# Patient Record
Sex: Male | Born: 1955
Health system: Southern US, Community
[De-identification: ages and names within clinical notes are randomized; demographics above are authoritative.]

## PROBLEM LIST (undated history)

## (undated) ENCOUNTER — Inpatient Hospital Stay: Admission: EM | Payer: Self-pay | Source: Home / Self Care

## (undated) DIAGNOSIS — M549 Dorsalgia, unspecified: Secondary | ICD-10-CM

## (undated) DIAGNOSIS — N529 Male erectile dysfunction, unspecified: Secondary | ICD-10-CM

## (undated) DIAGNOSIS — I2510 Atherosclerotic heart disease of native coronary artery without angina pectoris: Secondary | ICD-10-CM

## (undated) DIAGNOSIS — Z9289 Personal history of other medical treatment: Secondary | ICD-10-CM

## (undated) DIAGNOSIS — I10 Essential (primary) hypertension: Secondary | ICD-10-CM

## (undated) HISTORY — DX: Essential (primary) hypertension: I10

## (undated) HISTORY — PX: OTHER SURGICAL HISTORY: SHX169

---

## 2004-12-01 ENCOUNTER — Ambulatory Visit: Payer: Self-pay | Admitting: Family Medicine

## 2004-12-11 ENCOUNTER — Ambulatory Visit (HOSPITAL_COMMUNITY): Admission: RE | Admit: 2004-12-11 | Discharge: 2004-12-11 | Payer: Self-pay | Admitting: Family Medicine

## 2006-03-05 ENCOUNTER — Ambulatory Visit: Payer: Self-pay | Admitting: Family Medicine

## 2006-03-08 ENCOUNTER — Telehealth (INDEPENDENT_AMBULATORY_CARE_PROVIDER_SITE_OTHER): Payer: Self-pay | Admitting: Family Medicine

## 2006-03-15 ENCOUNTER — Encounter (INDEPENDENT_AMBULATORY_CARE_PROVIDER_SITE_OTHER): Payer: Self-pay | Admitting: Family Medicine

## 2006-06-04 ENCOUNTER — Ambulatory Visit: Payer: Self-pay | Admitting: Family Medicine

## 2006-10-05 ENCOUNTER — Encounter (INDEPENDENT_AMBULATORY_CARE_PROVIDER_SITE_OTHER): Payer: Self-pay | Admitting: Family Medicine

## 2006-10-08 LAB — CONVERTED CEMR LAB
ALT: 17 units/L (ref 0–53)
AST: 20 units/L (ref 0–37)
Albumin: 4.2 g/dL (ref 3.5–5.2)
Alkaline Phosphatase: 72 units/L (ref 39–117)
BUN: 27 mg/dL — ABNORMAL HIGH (ref 6–23)
Basophils Absolute: 0 10*3/uL (ref 0.0–0.1)
Basophils Relative: 0 % (ref 0–1)
CO2: 24 meq/L (ref 19–32)
Calcium: 9 mg/dL (ref 8.4–10.5)
Chloride: 106 meq/L (ref 96–112)
Cholesterol: 145 mg/dL (ref 0–200)
Creatinine, Ser: 1.04 mg/dL (ref 0.40–1.50)
Eosinophils Absolute: 0 10*3/uL (ref 0.0–0.7)
Eosinophils Relative: 1 % (ref 0–5)
Glucose, Bld: 91 mg/dL (ref 70–99)
HCT: 42 % (ref 39.0–52.0)
HDL: 55 mg/dL (ref >39)
Hemoglobin: 13.6 g/dL (ref 13.0–17.0)
LDL Cholesterol: 82 mg/dL (ref 0–99)
Lymphocytes Relative: 61 % — ABNORMAL HIGH (ref 12–46)
Lymphs Abs: 2.9 10*3/uL (ref 0.7–3.3)
MCHC: 32.4 g/dL (ref 30.0–36.0)
MCV: 96.1 fL (ref 78.0–100.0)
Monocytes Absolute: 0.4 10*3/uL (ref 0.2–0.7)
Monocytes Relative: 9 % (ref 3–11)
Neutro Abs: 1.3 10*3/uL — ABNORMAL LOW (ref 1.7–7.7)
Neutrophils Relative %: 28 % — ABNORMAL LOW (ref 43–77)
PSA: 0.37 ng/mL (ref 0.10–4.00)
Platelets: 272 10*3/uL (ref 150–400)
Potassium: 4 meq/L (ref 3.5–5.3)
RBC: 4.37 M/uL (ref 4.22–5.81)
RDW: 12.8 % (ref 11.5–14.0)
Sodium: 140 meq/L (ref 135–145)
TSH: 2.316 microintl units/mL (ref 0.350–5.50)
Total Bilirubin: 0.7 mg/dL (ref 0.3–1.2)
Total CHOL/HDL Ratio: 2.6
Total Protein: 7.7 g/dL (ref 6.0–8.3)
Triglycerides: 40 mg/dL (ref <150)
VLDL: 8 mg/dL (ref 0–40)
WBC: 4.7 10*3/uL (ref 4.0–10.5)

## 2007-01-13 ENCOUNTER — Encounter: Payer: Self-pay | Admitting: Family Medicine

## 2007-02-24 ENCOUNTER — Ambulatory Visit: Payer: Self-pay | Admitting: Family Medicine

## 2007-02-24 ENCOUNTER — Telehealth (INDEPENDENT_AMBULATORY_CARE_PROVIDER_SITE_OTHER): Payer: Self-pay | Admitting: *Deleted

## 2007-02-24 LAB — CONVERTED CEMR LAB
Cholesterol, target level: 200 mg/dL
HDL goal, serum: 40 mg/dL
LDL Goal: 160 mg/dL

## 2007-04-08 ENCOUNTER — Ambulatory Visit (HOSPITAL_COMMUNITY): Admission: RE | Admit: 2007-04-08 | Discharge: 2007-04-08 | Payer: Self-pay | Admitting: Gastroenterology

## 2007-04-08 ENCOUNTER — Ambulatory Visit: Payer: Self-pay | Admitting: Gastroenterology

## 2008-02-27 ENCOUNTER — Ambulatory Visit: Payer: Self-pay | Admitting: Family Medicine

## 2008-02-28 ENCOUNTER — Encounter (INDEPENDENT_AMBULATORY_CARE_PROVIDER_SITE_OTHER): Payer: Self-pay | Admitting: Family Medicine

## 2008-03-05 LAB — CONVERTED CEMR LAB
ALT: 26 units/L (ref 0–53)
AST: 22 units/L (ref 0–37)
Albumin: 4.3 g/dL (ref 3.5–5.2)
Alkaline Phosphatase: 73 units/L (ref 39–117)
BUN: 23 mg/dL (ref 6–23)
CO2: 26 meq/L (ref 19–32)
Calcium: 9.5 mg/dL (ref 8.4–10.5)
Chloride: 104 meq/L (ref 96–112)
Cholesterol: 164 mg/dL (ref 0–200)
Creatinine, Ser: 0.99 mg/dL (ref 0.40–1.50)
Glucose, Bld: 88 mg/dL (ref 70–99)
HDL: 55 mg/dL (ref >39)
LDL Cholesterol: 100 mg/dL — ABNORMAL HIGH (ref 0–99)
PSA: 0.43 ng/mL (ref 0.10–4.00)
Potassium: 4.1 meq/L (ref 3.5–5.3)
Sodium: 140 meq/L (ref 135–145)
TSH: 2.645 microintl units/mL (ref 0.350–4.50)
Total Bilirubin: 0.6 mg/dL (ref 0.3–1.2)
Total CHOL/HDL Ratio: 3
Total Protein: 8.2 g/dL (ref 6.0–8.3)
Triglycerides: 44 mg/dL (ref <150)
VLDL: 9 mg/dL (ref 0–40)

## 2008-09-11 ENCOUNTER — Ambulatory Visit: Payer: Self-pay | Admitting: Family Medicine

## 2008-09-11 DIAGNOSIS — R350 Frequency of micturition: Secondary | ICD-10-CM | POA: Insufficient documentation

## 2008-09-11 LAB — CONVERTED CEMR LAB
Bilirubin Urine: NEGATIVE
Blood Glucose, Fingerstick: 104
Glucose, Urine, Semiquant: NEGATIVE
Ketones, urine, test strip: NEGATIVE
Nitrite: NEGATIVE
Specific Gravity, Urine: 1.025
Urobilinogen, UA: 1
WBC Urine, dipstick: NEGATIVE
pH: 5.5

## 2009-02-21 ENCOUNTER — Ambulatory Visit (HOSPITAL_COMMUNITY): Payer: Self-pay | Admitting: Psychiatry

## 2009-03-06 ENCOUNTER — Ambulatory Visit (HOSPITAL_COMMUNITY): Payer: Self-pay | Admitting: Psychology

## 2009-03-21 ENCOUNTER — Ambulatory Visit (HOSPITAL_COMMUNITY): Payer: Self-pay | Admitting: Psychology

## 2009-03-25 ENCOUNTER — Ambulatory Visit: Payer: Self-pay | Admitting: Family Medicine

## 2009-03-25 LAB — CONVERTED CEMR LAB: OCCULT 1: NEGATIVE

## 2009-03-26 ENCOUNTER — Encounter: Payer: Self-pay | Admitting: Physician Assistant

## 2009-03-26 LAB — CONVERTED CEMR LAB
Chlamydia, Swab/Urine, PCR: NEGATIVE
GC Probe Amp, Urine: NEGATIVE

## 2009-04-01 ENCOUNTER — Encounter: Payer: Self-pay | Admitting: Physician Assistant

## 2009-04-02 LAB — CONVERTED CEMR LAB
ALT: 10 units/L (ref 0–53)
AST: 16 units/L (ref 0–37)
Albumin: 4.2 g/dL (ref 3.5–5.2)
Alkaline Phosphatase: 65 units/L (ref 39–117)
BUN: 14 mg/dL (ref 6–23)
CO2: 27 meq/L (ref 19–32)
Calcium: 9.2 mg/dL (ref 8.4–10.5)
Chloride: 100 meq/L (ref 96–112)
Cholesterol: 139 mg/dL (ref 0–200)
Creatinine, Ser: 0.93 mg/dL (ref 0.40–1.50)
Glucose, Bld: 92 mg/dL (ref 70–99)
HCT: 41.1 % (ref 39.0–52.0)
HDL: 50 mg/dL (ref >39)
Hemoglobin: 13.3 g/dL (ref 13.0–17.0)
Herpes Simplex Vrs I&II-IgM Ab (EIA): 0.21
LDL Cholesterol: 80 mg/dL (ref 0–99)
MCHC: 32.4 g/dL (ref 30.0–36.0)
MCV: 97.9 fL (ref 78.0–100.0)
PSA: 0.3 ng/mL (ref 0.10–4.00)
Platelets: 271 10*3/uL (ref 150–400)
Potassium: 3.9 meq/L (ref 3.5–5.3)
RBC: 4.2 M/uL — ABNORMAL LOW (ref 4.22–5.81)
RDW: 12.7 % (ref 11.5–15.5)
Sodium: 137 meq/L (ref 135–145)
TSH: 2.541 microintl units/mL (ref 0.350–4.500)
Total Bilirubin: 1.1 mg/dL (ref 0.3–1.2)
Total CHOL/HDL Ratio: 2.8
Total Protein: 8 g/dL (ref 6.0–8.3)
Triglycerides: 43 mg/dL (ref <150)
VLDL: 9 mg/dL (ref 0–40)
Vit D, 25-Hydroxy: 14 ng/mL — ABNORMAL LOW (ref 30–89)
WBC: 4.7 10*3/uL (ref 4.0–10.5)

## 2009-04-11 ENCOUNTER — Ambulatory Visit (HOSPITAL_COMMUNITY): Payer: Self-pay | Admitting: Psychology

## 2009-04-25 ENCOUNTER — Ambulatory Visit (HOSPITAL_COMMUNITY): Payer: Self-pay | Admitting: Psychology

## 2010-02-11 NOTE — Letter (Signed)
Summary: rpc chart  rpc chart   Imported By: Curtis Sites 10/24/2009 15:26:00  _____________________________________________________________________  External Attachment:    Type:   Image     Comment:   External Document

## 2010-02-11 NOTE — Assessment & Plan Note (Signed)
Summary: NEW PATIENT- room 1   Vital Signs:  Patient profile:   55 year old male Height:      69.5 inches Weight:      176.75 pounds BMI:     25.82 O2 Sat:      100 % on Room air Pulse rate:   90 / minute Resp:     16 per minute BP sitting:   100 / 60  (left arm)  Vitals Entered By: Adella Hare LPN (March 25, 2009 1:18 PM) CC: new patient Is Patient Diabetic? No Pain Assessment Patient in pain? no        Primary Provider:  Franchot Heidelberg, MD  CC:  new patient.  History of Present Illness: New pt here to establish care.  No current complaints.  He does want blood work done due to FH of diabetes.  His last physical was 1 yr ago.  Is going thru a divorce.  Is seeing a therapist to help deal with some anger that he is feeling towards his wife that has come about due to circumstances of the divorce.  He is not having any thoughts of harming himself or others.    Pt has lost some wt since his physical 1 yr ago.  He states that this is actually is avg wt & that he was heavier than usual last yr.  He is uncertain of his last tetnus vaccine.  There is no record of one in his previous records. His colonoscopy is UTD.  Current Medications (verified): 1)  Adult Aspirin Ec Low Strength 81 Mg  Tbec (Aspirin) .... One Daily  Allergies (verified): No Known Drug Allergies  Past History:  Past medical, surgical, family and social histories (including risk factors) reviewed, and no changes noted (except as noted below).  Past Medical History: Reviewed history from 02/27/2008 and no changes required. 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 Denies surgical history  Family History: Reviewed history from 02/27/2008 and no changes required. mother-deceased-DM - 70s/ Lung cancer due to  smoking. father-deceased-?ETOH abuse and tobacco abuse, may have had TB Brohers - 5 - unsure of ages Sisters - 60 - Unsure of ages - one with DM Kids - 6 kids - 4 boys and 2 girls - all healthy  Social History: Reviewed history from 02/24/2007 and no changes required. Married - seperated, divorcing Never Smoked Alcohol use-no Drug use-no Occupation: Copy at [employer]  Review of Systems General:  Denies chills and fever. Eyes:  Denies blurring and double vision. ENT:  Denies earache, nasal congestion, postnasal drainage, sinus pressure, and sore throat. CV:  Denies chest pain or discomfort, palpitations, and swelling of feet. Resp:  Denies cough and shortness of breath. GI:  Denies bloody stools, change in bowel habits, constipation, dark tarry stools, diarrhea, indigestion, nausea, and vomiting. GU:  Denies decreased libido, discharge, dysuria, erectile dysfunction, incontinence, nocturia, urinary frequency, and urinary hesitancy. MS:  Denies joint pain, low back pain, and mid back pain. Derm:  Denies lesion(s) and rash. Neuro:  Denies numbness and tingling. Psych:  Denies anxiety, depression, suicidal thoughts/plans, and thoughts of violence. Heme:  Denies enlarge lymph nodes and fevers.  Physical Exam  General:  Well-developed,well-nourished,in no acute distress; alert,appropriate and cooperative throughout examination Head:  Normocephalic and atraumatic without obvious abnormalities. No apparent alopecia or balding. Eyes:  No corneal or conjunctival inflammation noted. EOMI.  Perrla. Funduscopic exam benign, without hemorrhages, exudates or papilledema. Vision grossly normal. Ears:  External ear exam shows no significant lesions or deformities.  Otoscopic examination reveals clear canals, tympanic membranes are intact bilaterally without bulging, retraction, inflammation or discharge. Hearing is grossly normal bilaterally. Nose:  External nasal examination shows no deformity or  inflammation. Nasal mucosa are pink and moist without lesions or exudates. Mouth:  Oral mucosa and oropharynx without lesions or exudates.  Teeth in good repair. Neck:  No deformities, masses, or tenderness noted.no thyromegaly.   Lungs:  Normal respiratory effort, chest expands symmetrically. Lungs are clear to auscultation, no crackles or wheezes. Heart:  Normal rate and regular rhythm. S1 and S2 normal without gallop, murmur, click, rub or other extra sounds. Abdomen:  Bowel sounds positive,abdomen soft and non-tender without masses, organomegaly or hernias noted. Rectal:  No external abnormalities noted. Normal sphincter tone. No rectal masses or tenderness. Genitalia:  Testes bilaterally descended without nodularity, tenderness or masses. No scrotal masses or lesions. No penis lesions or urethral discharge. Prostate:  Prostate gland firm and smooth, no enlargement, nodularity, tenderness, mass, asymmetry or induration. Pulses:  R radial normal, R posterior tibial normal, L radial normal, and L posterior tibial normal.   Extremities:  No clubbing, cyanosis, edema, or deformity noted with normal full range of motion of all joints.   Neurologic:  alert & oriented X3, cranial nerves II-XII intact, strength normal in all extremities, sensation intact to light touch, gait normal, and DTRs symmetrical and normal.   Skin:  Intact without suspicious lesions or rashes Cervical Nodes:  No lymphadenopathy noted Psych:  Cognition and judgment appear intact. Alert and cooperative with normal attention span and concentration. No apparent delusions, illusions, hallucinations   Impression & Recommendations:  Problem # 1:  Preventive Health Care (ICD-V70.0) Labs ordered, including std screening per pt request. Encouraged condom use now that he is single again. Discussed & encouraged routine dental & eye exams. Tdap given today.  Complete Medication List: 1)  Adult Aspirin Ec Low Strength 81 Mg Tbec  (Aspirin) .... One daily  Other Orders: T-Comprehensive Metabolic Panel [id number]) T-Lipid Profile [id number]) T-CBC No Diff ([id number]) T-TSH [id number]) T-PSA [id number]) T-Vitamin D (25-Hydroxy) [id number]) T-HIV-1 (Screen) [phone]) T-RPR (Syphilis) [id number]) T-Herpes I and II, IgM, Reflex [phone]) Hepatitis C Antibody ([id number]) T-Chlamydia & GC Probe, Urine ([id number]) Tdap => 16yrs IM (10932) T-Hemoccult Card-Single (35573) Admin 1st Vaccine (22025) Admin 1st Vaccine [facility]) [phone])  Patient Instructions: 1)  Your next physical is due in 1 year.  I will see you sooner as needed. 2)  Please schedule an eye exam & continue routine dental care. 3)  I have ordered blood work for you.  This needs to be done fasting.   Tetanus/Td Vaccine    Vaccine Type: Tdap    Site: left deltoid    Mfr: Sanofi Pasteur    Dose: 0.5 ml    Route: IM    Given by: Adella Hare LPN    Exp. Date: 04/04/2011    Lot #: [id number]    VIS given: 11/30/06 version given March 25, 2009.     Laboratory Results    Stool - Occult Blood Hemmoccult #1: negative Date: 03/25/2009    Prevention & Chronic Care Immunizations   Influenza vaccine: Hx of  (02/27/2008)   Influenza vaccine due: 02/2009    Tetanus booster: 03/25/2009: Tdap   Tetanus booster due: 03/26/2019    Pneumococcal vaccine: Not documented   Pneumococcal vaccine deferral: Not indicated  (  03/25/2009)   Pneumococcal vaccine due: 02/16/2022  Colorectal Screening   Hemoccult: Not documented   Hemoccult due: 03/26/2010    Colonoscopy: normal  (04/08/2007)   Colonoscopy due: 04/2017  Other Screening   PSA: 0.43  (02/28/2008)   PSA ordered.   Smoking status: never  (03/05/2006)  Lipids   Total Cholesterol: 164  (02/28/2008)   LDL: 100  (02/28/2008)   LDL Direct: Not documented   HDL: 55  (02/28/2008)   Triglycerides: 44  (02/28/2008)   Nursing Instructions: Give tetanus booster  today

## 2010-02-11 NOTE — Letter (Signed)
Summary: Letter  Letter   Imported By: Lind Guest 04/01/2009 15:32:00  _____________________________________________________________________  External Attachment:    Type:   Image     Comment:   External Document

## 2010-05-27 NOTE — Op Note (Signed)
NAME:  Eugene [name], Eugene [name]               ACCOUNT NO.:  [id number]   MEDICAL RECORD NO.:  [id number]          PATIENT TYPE:  AMB   LOCATION:  DAY                           FACILITY:  [facility]   PHYSICIAN:  Kassie Mends, M.D.      DATE OF BIRTH:  09-07-1955   DATE OF PROCEDURE:  04/08/2007  DATE OF DISCHARGE:                               OPERATIVE REPORT   REFERRING PHYSICIAN:  Franchot Heidelberg, M.D.   PROCEDURE:  Colonoscopy.   INDICATION FOR EXAM:  Eugene [name] is a 55 year old male who presents for  average-risk colon cancer screening.   FINDINGS:  Small internal hemorrhoids.  Otherwise normal colon without  evidence of polyps, mass, inflammatory changes, diverticula or AVMs.   RECOMMENDATIONS:  1. High-fiber diet.  He is given a handout on high-fiber diet and      hemorrhoids.  2. Screening colonoscopy in 10 years with a clear liquid diet.  The      patient is instructed not to consume solid food prior to his      colonoscopy.   MEDICATIONS:  1. Demerol 50 mg IV.  2. Versed 4 mg IV.   PROCEDURE TECHNIQUE:  Physical exam was performed.  Informed consent was  obtained from the patient explaining the benefits, risks and  alternatives to the procedure.  The patient was connected to the monitor  and placed in the left lateral position.  Continuous oxygen was provided  by nasal cannula and IV medicine administered through an indwelling  cannula.  After administration of sedation and a rectal exam, the  patient's rectum was intubated and the scope was advanced under direct  visualization to the cecum.  The patient had some particulate matter and  retained liquid stool in the colon, which was able to be aspirated.  The  presence of solid or digested particulate food caused a film to form on  the lens.  The scope was withdrawn at 40-50 cm and then reinserted after  the lens was cleaned.  The scope was removed slowly by carefully  examining the color, texture, anatomy and integrity of the  mucosa on the  way out.  The patient was recovered in endoscopy and discharged home in  satisfactory condition.      Kassie Mends, M.D.  Electronically Signed     SM/MEDQ  D:  04/08/2007  T:  04/09/2007  Job:  [id number]   cc:   Franchot Heidelberg, M.D.

## 2010-09-09 ENCOUNTER — Ambulatory Visit (INDEPENDENT_AMBULATORY_CARE_PROVIDER_SITE_OTHER): Payer: Commercial Managed Care - PPO | Admitting: Family Medicine

## 2010-09-09 ENCOUNTER — Encounter: Payer: Self-pay | Admitting: Family Medicine

## 2010-09-09 VITALS — BP 102/70 | HR 61 | Resp 16 | Ht 69.75 in | Wt 173.4 lb

## 2010-09-09 DIAGNOSIS — Z0000 Encounter for general adult medical examination without abnormal findings: Secondary | ICD-10-CM

## 2010-09-09 DIAGNOSIS — N529 Male erectile dysfunction, unspecified: Secondary | ICD-10-CM

## 2010-09-09 NOTE — Patient Instructions (Signed)
Return for a physical in 1 year unless you have any acute concerns Call back if you would like the lab done.  Keep up the good work!

## 2010-09-10 ENCOUNTER — Encounter: Payer: Self-pay | Admitting: Family Medicine

## 2010-09-10 NOTE — Progress Notes (Signed)
  Subjective:    Patient ID: Eugene [name], male    DOB: 1955/09/04, 55 y.o.   MRN: [id number]  HPI Pt here for annual exam.  His only concern is loosing his erection early, he thinks it is secondary to stress, he is in a monogamous relationship, occ premature ejaculation  He is a [employer] employee and had is routine health maintenance lab in Oct 2011- labs will be scanned into system No chronic medical conditions Takes MVI and ASA most days a week He does not exercise No tobacco  Colonoscopy done PSA 0.4 at screening  He is overdue for dental exam and eye exam  Review of Systems GEN- denies fatigue, fever, weight loss,weakness, recent illness HEENT- denies eye drainage, change in vision, nasal discharge, CVS- denies chest pain, palpitations RESP- denies SOB, cough, wheeze ABD- denies N/V, change in stools, abd pain GU- denies dysuria, hematuria, dribbling, incontinence, denies penile discharge MSK- denies joint pain, muscle aches, injury Neuro- denies headache, dizziness, syncope, seizure activity      Objective:   Physical Exam   GEN- NAD, alert and oriented x3 HEENT- PERRL, EOMI, non injected sclera, pink conjunctiva, MMM, oropharynx clear, difficult fundoscopic exam secondary to small pupils Neck- Supple, no thryomegaly CVS- RRR, no murmur RESP-CTAB Abd- NABS, sot, NT, ND, no appreciable hernias, no masses EXT- No edema Pulses- Radial, DP- 2+        Assessment & Plan:   CPE- Normal physical exam, reviewed labs, FLP, A1C, PSA all normal. Will scan into chart  Immunizations UTD, pt will have flu shot at employee health so that it will be on his record  Erectile dysfunction- I discussed, he is a healthy male, without any disorders that can lead to neuropathy, his PSA is normal, would check testosterone and pt could have  A trial of Viagra or Cialis. He declined any work-up or meds at this time. He will let me know if he changes his mind.

## 2011-01-20 ENCOUNTER — Ambulatory Visit (INDEPENDENT_AMBULATORY_CARE_PROVIDER_SITE_OTHER): Payer: Commercial Managed Care - PPO | Admitting: Family Medicine

## 2011-01-20 ENCOUNTER — Encounter: Payer: Self-pay | Admitting: Family Medicine

## 2011-01-20 VITALS — BP 104/78 | HR 58 | Resp 16 | Ht 69.5 in | Wt 175.1 lb

## 2011-01-20 DIAGNOSIS — N529 Male erectile dysfunction, unspecified: Secondary | ICD-10-CM

## 2011-01-20 MED ORDER — TADALAFIL 10 MG PO TABS: 10.0000 mg | ORAL_TABLET | ORAL | Status: DC | PRN

## 2011-01-20 NOTE — Progress Notes (Signed)
  Subjective:    Patient ID: Eugene [name], male    DOB: Apr 08, 1955, 56 y.o.   MRN: [id number]  HPI  Patient has history of erectile dysfunction. Her previous visit he had defer testosterone testing and use of medication. Today he is here with his fiance he would like to have his levels checked. He does state that he occasionally has pain in the testicular region into his rectum but this occurs very sporadically. He denies any penile discharge, difficulty with urination, blood in urine. He is unable to maintain his erection has decreased libido and premature ejaculation. He denies any fatigue. He does admit to some stress. He had his PSA checked our last visit which was normal. His other labs were also normal.   Review of Systems - per above     Objective:   Physical Exam GEN-NAD, pleasant       Assessment & Plan:

## 2011-01-20 NOTE — Assessment & Plan Note (Signed)
Patient is fairly healthy middle-aged male. I will refer him to urology for workup of his erectile dysfunction, I think he was somewhat comfortable with a male physician discussing this. Testosterone levels will be drawn today, patient given a prescription for 30 day trial of Cialis.

## 2011-01-20 NOTE — Patient Instructions (Signed)
You will be referred to a urologist  We will call with lab results Try the Cialis- take 1 hour before intercourse

## 2011-01-21 LAB — TESTOSTERONE, FREE, TOTAL, SHBG
Sex Hormone Binding: 34 nmol/L (ref 13–71)
Testosterone, Free: 78.4 pg/mL (ref 47.0–244.0)
Testosterone-% Free: 2 % (ref 1.6–2.9)
Testosterone: 393.48 ng/dL (ref 250–890)

## 2011-03-13 ENCOUNTER — Ambulatory Visit (INDEPENDENT_AMBULATORY_CARE_PROVIDER_SITE_OTHER): Payer: Commercial Managed Care - PPO | Admitting: Urology

## 2011-03-13 DIAGNOSIS — N529 Male erectile dysfunction, unspecified: Secondary | ICD-10-CM

## 2011-04-20 ENCOUNTER — Telehealth: Payer: Self-pay | Admitting: Family Medicine

## 2011-04-20 NOTE — Telephone Encounter (Signed)
No answering machine I need another copy of his labs from [facility], so that I can scan again and send a copy to Dr. Annabell Howells ( Urology)

## 2011-06-03 NOTE — Telephone Encounter (Signed)
Attempted to call pt and number is no longer working. Will send pt a letter asking him to contact office.

## 2011-09-09 ENCOUNTER — Encounter: Payer: Commercial Managed Care - PPO | Admitting: Family Medicine

## 2011-10-05 ENCOUNTER — Ambulatory Visit (INDEPENDENT_AMBULATORY_CARE_PROVIDER_SITE_OTHER): Payer: Commercial Managed Care - PPO | Admitting: Family Medicine

## 2011-10-05 ENCOUNTER — Encounter: Payer: Self-pay | Admitting: Family Medicine

## 2011-10-05 VITALS — BP 130/68 | HR 74 | Resp 18 | Ht 69.5 in | Wt 180.0 lb

## 2011-10-05 DIAGNOSIS — Z1321 Encounter for screening for nutritional disorder: Secondary | ICD-10-CM

## 2011-10-05 DIAGNOSIS — Z130 Encounter for screening for diseases of the blood and blood-forming organs and certain disorders involving the immune mechanism: Secondary | ICD-10-CM

## 2011-10-05 DIAGNOSIS — Z13220 Encounter for screening for lipoid disorders: Secondary | ICD-10-CM

## 2011-10-05 DIAGNOSIS — Z0000 Encounter for general adult medical examination without abnormal findings: Secondary | ICD-10-CM

## 2011-10-05 DIAGNOSIS — N529 Male erectile dysfunction, unspecified: Secondary | ICD-10-CM

## 2011-10-05 LAB — CBC
HCT: 41.8 % (ref 39.0–52.0)
MCH: 32 pg (ref 26.0–34.0)
MCHC: 34 g/dL (ref 30.0–36.0)
MCV: 94.1 fL (ref 78.0–100.0)
Platelets: 299 10*3/uL (ref 150–400)
RBC: 4.44 MIL/uL (ref 4.22–5.81)
RDW: 12.5 % (ref 11.5–15.5)
WBC: 4.7 10*3/uL (ref 4.0–10.5)

## 2011-10-05 LAB — LIPID PANEL
Cholesterol: 168 mg/dL (ref 0–200)
LDL Cholesterol: 108 mg/dL — ABNORMAL HIGH (ref 0–99)
Total CHOL/HDL Ratio: 3.3 Ratio
Triglycerides: 44 mg/dL (ref <150)
VLDL: 9 mg/dL (ref 0–40)

## 2011-10-05 LAB — COMPREHENSIVE METABOLIC PANEL
ALT: 12 U/L (ref 0–53)
AST: 16 U/L (ref 0–37)
Albumin: 4.4 g/dL (ref 3.5–5.2)
Alkaline Phosphatase: 64 U/L (ref 39–117)
BUN: 21 mg/dL (ref 6–23)
CO2: 29 mEq/L (ref 19–32)
Calcium: 9.6 mg/dL (ref 8.4–10.5)
Chloride: 104 mEq/L (ref 96–112)
Creat: 0.95 mg/dL (ref 0.50–1.35)
Potassium: 4 mEq/L (ref 3.5–5.3)
Sodium: 140 mEq/L (ref 135–145)
Total Bilirubin: 0.9 mg/dL (ref 0.3–1.2)
Total Protein: 8.2 g/dL (ref 6.0–8.3)

## 2011-10-05 NOTE — Patient Instructions (Signed)
Continue exercising Get the flu shot at work  Get the labs done today we will call with results Eye appt needed Dental visit needed F/U 1 year

## 2011-10-05 NOTE — Assessment & Plan Note (Addendum)
Unchanged, Stress playing a major factor, normal Testosterone levels, PDE offered again pt declined, reviewed urology note, otherwise normal  He is to work on stress and his relationship, continue exercise I will f/u with cards on whether or not routine stress testing is done   Discussed with cardiology, stress test not needed

## 2011-10-05 NOTE — Progress Notes (Signed)
  Subjective:    Patient ID: Eugene [name], male    DOB: 1955-02-09, 56 y.o.   MRN: [id number]  HPI Patient here for complete physical exam. He continues to have difficulty refractive dysfunction however does not want medications. He was seen by urology a couple months ago with otherwise normal examination. There was some concern that he had ED early and this can be associated with early coronary artery disease. He has no family history of coronary artery disease. He has been stressed a lot because of his separation now 4 years with his wife, not officially divorced also having difficulty with his girlfriend and trust is an issue. He does not feel depressed   Review of Systems  GEN- denies fatigue, fever, weight loss,weakness, recent illness HEENT- denies eye drainage, change in vision, nasal discharge, CVS- denies chest pain, palpitations RESP- denies SOB, cough, wheeze ABD- denies N/V, change in stools, abd pain GU- denies dysuria, hematuria, dribbling, incontinence MSK- denies joint pain, muscle aches, injury Neuro- denies headache, dizziness, syncope, seizure activity      Objective:   Physical Exam GEN- NAD, alert and oriented x3 HEENT- PERRL, EOMI, non injected sclera, pink conjunctiva, MMM, oropharynx clear Neck- Supple, CVS- RRR, no murmur RESP-CTAB ABD-NABS,soft,NT,ND EXT- No edema Pulses- Radial, DP- 2+ Psych-normal affect and Mood       Assessment & Plan:    CPE- cancer prevention UTD, flu shot to be done at work   Fasting labs to be done today  Immunizations otherwise UTD  Needs Eye visit/dental visit   Seen by urology

## 2011-10-10 LAB — VITAMIN D 1,25 DIHYDROXY
Vitamin D 1, 25 (OH)2 Total: 57 pg/mL (ref 18–72)
Vitamin D2 1, 25 (OH)2: <8 pg/mL
Vitamin D3 1, 25 (OH)2: 57 pg/mL

## 2011-11-11 ENCOUNTER — Telehealth: Payer: Self-pay | Admitting: Family Medicine

## 2011-11-11 NOTE — Telephone Encounter (Signed)
Called patient back to find out what he needed and he said to just have Dr call him back when you got a chance. Wouldn't go into detail

## 2011-11-12 MED ORDER — TADALAFIL 5 MG PO TABS: 5.0000 mg | ORAL_TABLET | Freq: Every day | ORAL | Status: DC | PRN

## 2011-11-12 NOTE — Telephone Encounter (Signed)
Spoke with pt, would like to try Cialis, will give him 30 day sample  Instructions 2 tablets 1 hour before intercourse

## 2012-02-19 ENCOUNTER — Encounter: Payer: Self-pay | Admitting: Family Medicine

## 2012-02-19 ENCOUNTER — Ambulatory Visit (INDEPENDENT_AMBULATORY_CARE_PROVIDER_SITE_OTHER): Payer: Commercial Managed Care - PPO | Admitting: Family Medicine

## 2012-02-19 VITALS — BP 120/80 | HR 65 | Resp 16 | Ht 69.5 in | Wt 186.0 lb

## 2012-02-19 DIAGNOSIS — N529 Male erectile dysfunction, unspecified: Secondary | ICD-10-CM
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
## 2012-02-19 NOTE — Patient Instructions (Signed)
Try the 20mg  , call if this does not help and we will try Viagra Physical in Sept 2014

## 2012-02-19 NOTE — Assessment & Plan Note (Signed)
Believe his erectile dysfunction centers around the stress and strain he has in his relationship. He has been evaluated by urology already. We will increase his Cialis 20 mg at this does not work we will try him on Viagra 5200 mg prior to sexual activity. He's also going to start exercising again

## 2012-05-30 ENCOUNTER — Telehealth: Payer: Self-pay | Admitting: Family Medicine

## 2012-05-30 NOTE — Telephone Encounter (Signed)
Patient would not specify but I think he is referring to needing a refill on Cialis.  Is this ok?

## 2012-05-30 NOTE — Telephone Encounter (Signed)
He was suppose to try cialsis 20mg  if this didn't help, i was going to change him to viagra 100mg  He needs to tell us which one he wants

## 2012-05-31 MED ORDER — SILDENAFIL CITRATE 100 MG PO TABS: 100.0000 mg | ORAL_TABLET | ORAL | Status: DC | PRN

## 2012-05-31 NOTE — Telephone Encounter (Signed)
Patient states that he would like Viagra.

## 2012-06-02 ENCOUNTER — Other Ambulatory Visit: Payer: Self-pay

## 2012-06-02 ENCOUNTER — Telehealth: Payer: Self-pay | Admitting: Family Medicine

## 2012-06-02 MED ORDER — SILDENAFIL CITRATE 100 MG PO TABS: 100.0000 mg | ORAL_TABLET | ORAL | Status: DC | PRN

## 2012-06-02 NOTE — Telephone Encounter (Signed)
Spoke with pt, he was concerned the dose was 100 mg and his Cialis was 20 mg. Explained to him that these are 2 different types of medications and come in 2 different doses. If needed one of these help I recommended that he go back to urology may be penile injections or penile pump or some other new medication may help him better he voiced understanding.

## 2013-08-24 ENCOUNTER — Encounter: Payer: Self-pay | Admitting: Family Medicine

## 2013-08-24 ENCOUNTER — Ambulatory Visit (INDEPENDENT_AMBULATORY_CARE_PROVIDER_SITE_OTHER): Payer: 59 | Admitting: Family Medicine

## 2013-08-24 VITALS — BP 136/80 | HR 64 | Temp 98.3°F | Resp 12 | Ht 69.0 in | Wt 183.0 lb

## 2013-08-24 DIAGNOSIS — N529 Male erectile dysfunction, unspecified: Secondary | ICD-10-CM

## 2013-08-24 DIAGNOSIS — Z125 Encounter for screening for malignant neoplasm of prostate: Secondary | ICD-10-CM

## 2013-08-24 DIAGNOSIS — Z0000 Encounter for general adult medical examination without abnormal findings: Secondary | ICD-10-CM

## 2013-08-24 DIAGNOSIS — K6289 Other specified diseases of anus and rectum: Secondary | ICD-10-CM

## 2013-08-24 LAB — URINALYSIS, ROUTINE W REFLEX MICROSCOPIC
GLUCOSE, UA: NEGATIVE mg/dL
Ketones, ur: NEGATIVE mg/dL
LEUKOCYTES UA: NEGATIVE
Nitrite: NEGATIVE
Protein, ur: 30 mg/dL — AB
Specific Gravity, Urine: >1.03 — ABNORMAL HIGH (ref 1.005–1.030)
Urobilinogen, UA: 1 mg/dL (ref 0.0–1.0)
pH: 5.5 (ref 5.0–8.0)

## 2013-08-24 LAB — LIPID PANEL
CHOL/HDL RATIO: 3 ratio
Cholesterol: 151 mg/dL (ref 0–200)
HDL: 50 mg/dL (ref >39)
LDL Cholesterol: 93 mg/dL (ref 0–99)
Triglycerides: 40 mg/dL (ref <150)
VLDL: 8 mg/dL (ref 0–40)

## 2013-08-24 LAB — CBC WITH DIFFERENTIAL/PLATELET
Basophils Absolute: 0 10*3/uL (ref 0.0–0.1)
Basophils Relative: 0 % (ref 0–1)
EOS PCT: 1 % (ref 0–5)
Eosinophils Absolute: 0.1 10*3/uL (ref 0.0–0.7)
HCT: 40.4 % (ref 39.0–52.0)
HEMOGLOBIN: 13.5 g/dL (ref 13.0–17.0)
LYMPHS ABS: 2.9 10*3/uL (ref 0.7–4.0)
Lymphocytes Relative: 53 % — ABNORMAL HIGH (ref 12–46)
MCH: 31.8 pg (ref 26.0–34.0)
MCHC: 33.4 g/dL (ref 30.0–36.0)
MCV: 95.1 fL (ref 78.0–100.0)
Monocytes Absolute: 0.6 10*3/uL (ref 0.1–1.0)
Monocytes Relative: 11 % (ref 3–12)
Neutro Abs: 1.9 10*3/uL (ref 1.7–7.7)
Neutrophils Relative %: 35 % — ABNORMAL LOW (ref 43–77)
Platelets: 286 10*3/uL (ref 150–400)
RBC: 4.25 MIL/uL (ref 4.22–5.81)
RDW: 13.4 % (ref 11.5–15.5)
WBC: 5.4 10*3/uL (ref 4.0–10.5)

## 2013-08-24 LAB — COMPREHENSIVE METABOLIC PANEL
ALT: 18 U/L (ref 0–53)
AST: 26 U/L (ref 0–37)
Albumin: 4.4 g/dL (ref 3.5–5.2)
Alkaline Phosphatase: 68 U/L (ref 39–117)
BUN: 22 mg/dL (ref 6–23)
CO2: 28 mEq/L (ref 19–32)
Calcium: 9.7 mg/dL (ref 8.4–10.5)
Chloride: 106 mEq/L (ref 96–112)
Creat: 1.09 mg/dL (ref 0.50–1.35)
Glucose, Bld: 99 mg/dL (ref 70–99)
Potassium: 4 mEq/L (ref 3.5–5.3)
Sodium: 141 mEq/L (ref 135–145)
Total Bilirubin: 0.9 mg/dL (ref 0.2–1.2)
Total Protein: 8.9 g/dL — ABNORMAL HIGH (ref 6.0–8.3)

## 2013-08-24 LAB — URINALYSIS, MICROSCOPIC ONLY: Crystals: NONE SEEN

## 2013-08-24 MED ORDER — CIPROFLOXACIN HCL 500 MG PO TABS: 500.0000 mg | ORAL_TABLET | Freq: Two times a day (BID) | ORAL | Status: DC

## 2013-08-24 MED ORDER — SILDENAFIL CITRATE 100 MG PO TABS: 100.0000 mg | ORAL_TABLET | ORAL | Status: DC | PRN

## 2013-08-24 NOTE — Patient Instructions (Signed)
I recommend eye visit once a year I recommend dental visit every 6 months Goal is to  Exercise 30 minutes 5 days a week We will send a letter with lab results  Take antibiotics as prescribed

## 2013-08-24 NOTE — Progress Notes (Signed)
Patient ID: Eugene [name], male   DOB: [birth year], 58 y.o.   MRN: [id number]   Subjective:    Patient ID: Eugene [name], male    DOB: [birth year], 58 y.o.   MRN: [id number]  Patient presents for CPE  Pt here for CPE, complains of rectal pain that shoots to groin on and off past few months mostly occurs with ejaculation, no scrotal swelling, has history of ED, denies BPH symptoms. No change in BM, no blood in stool, no hematuria, no dysuria Immunizations UTD Due for fasting labs Colonoscopy UTD   Review Of Systems:  GEN- denies fatigue, fever, weight loss,weakness, recent illness HEENT- denies eye drainage, change in vision, nasal discharge, CVS- denies chest pain, palpitations RESP- denies SOB, cough, wheeze ABD- denies N/V, change in stools, abd pain GU- denies dysuria, hematuria, dribbling, incontinence MSK- denies joint pain, muscle aches, injury Neuro- denies headache, dizziness, syncope, seizure activity       Objective:    BP 136/80  Pulse 64  Temp(Src) 98.3 F (36.8 C) (Oral)  Resp 12  Ht 5\' 9"  (1.753 m)  Wt 183 lb (83.008 kg)  BMI 27.01 kg/m2 GEN- NAD, alert and oriented x3 HEENT- PERRL, EOMI, non injected sclera, pink conjunctiva, MMM, oropharynx clear Neck- Supple, no thyromegaly CVS- RRR, no murmur RESP-CTAB ABD-NABS,soft,NT,ND GU- rectum- Normal external appearance, no gross blood, prostate slightly enlarged, no nodules, no bogginess, no scrotal swelling  EXT- No edema Pulses- Radial, DP- 2+        Assessment & Plan:      Problem List Items Addressed This Visit   Routine general medical examination at a health care facility - Primary CPE done, fasting labs, PSA screening discussed risk/benefits   Relevant Orders       CBC with Differential (Completed)      Comprehensive metabolic panel (Completed)      Lipid panel (Completed)   Prostate cancer screening   Relevant Orders      PSA (Completed)   ED (erectile dysfunction)    Other Visit Diagnoses    Rectal pain        UA and culture done, possible prostatitis, will treat with cipro and see how he does, may need urology, no change in bowel movements, no blood in stool,     Relevant Orders       Urinalysis, Routine w reflex microscopic (Completed)       Urine culture       Note: This dictation was prepared with Dragon dictation along with smaller phrase technology. Any transcriptional errors that result from this process are unintentional.

## 2013-08-25 LAB — PSA: PSA: 0.32 ng/mL (ref <=4.00)

## 2013-08-25 LAB — URINE CULTURE
COLONY COUNT: NO GROWTH
Organism ID, Bacteria: NO GROWTH

## 2013-08-28 ENCOUNTER — Encounter: Payer: Self-pay | Admitting: *Deleted

## 2014-09-05 ENCOUNTER — Other Ambulatory Visit: Payer: Self-pay | Admitting: Family Medicine

## 2014-09-05 ENCOUNTER — Other Ambulatory Visit: Payer: 59

## 2014-09-05 DIAGNOSIS — Z0000 Encounter for general adult medical examination without abnormal findings: Secondary | ICD-10-CM

## 2014-09-05 LAB — COMPLETE METABOLIC PANEL WITH GFR
ALBUMIN: 3.8 g/dL (ref 3.6–5.1)
ALT: 16 U/L (ref 9–46)
AST: 20 U/L (ref 10–35)
Alkaline Phosphatase: 76 U/L (ref 40–115)
BUN: 19 mg/dL (ref 7–25)
CO2: 27 mmol/L (ref 20–31)
CREATININE: 0.87 mg/dL (ref 0.70–1.33)
Calcium: 8.7 mg/dL (ref 8.6–10.3)
Chloride: 102 mmol/L (ref 98–110)
GFR, Est African American: >89 mL/min (ref >=60)
GFR, Est Non African American: >89 mL/min (ref >=60)
Glucose, Bld: 90 mg/dL (ref 70–99)
Potassium: 4 mmol/L (ref 3.5–5.3)
Sodium: 137 mmol/L (ref 135–146)
Total Bilirubin: 0.6 mg/dL (ref 0.2–1.2)
Total Protein: 7.9 g/dL (ref 6.1–8.1)

## 2014-09-05 LAB — CBC WITH DIFFERENTIAL/PLATELET
BASOS ABS: 0 10*3/uL (ref 0.0–0.1)
Basophils Relative: 0 % (ref 0–1)
Eosinophils Absolute: 0.1 10*3/uL (ref 0.0–0.7)
Eosinophils Relative: 1 % (ref 0–5)
HEMATOCRIT: 39.4 % (ref 39.0–52.0)
Hemoglobin: 12.8 g/dL — ABNORMAL LOW (ref 13.0–17.0)
Lymphocytes Relative: 61 % — ABNORMAL HIGH (ref 12–46)
Lymphs Abs: 4 10*3/uL (ref 0.7–4.0)
MCH: 31.6 pg (ref 26.0–34.0)
MCHC: 32.5 g/dL (ref 30.0–36.0)
MCV: 97.3 fL (ref 78.0–100.0)
MPV: 9.5 fL (ref 8.6–12.4)
Monocytes Absolute: 0.7 10*3/uL (ref 0.1–1.0)
Monocytes Relative: 10 % (ref 3–12)
Neutro Abs: 1.8 10*3/uL (ref 1.7–7.7)
Neutrophils Relative %: 28 % — ABNORMAL LOW (ref 43–77)
Platelets: 253 10*3/uL (ref 150–400)
RBC: 4.05 MIL/uL — ABNORMAL LOW (ref 4.22–5.81)
RDW: 13.4 % (ref 11.5–15.5)
WBC: 6.6 10*3/uL (ref 4.0–10.5)

## 2014-09-05 LAB — LIPID PANEL
Cholesterol: 149 mg/dL (ref 125–200)
HDL: 42 mg/dL (ref >=40)
LDL Cholesterol: 93 mg/dL (ref <130)
Total CHOL/HDL Ratio: 3.5 Ratio (ref <=5.0)
Triglycerides: 70 mg/dL (ref <150)
VLDL: 14 mg/dL (ref <30)

## 2014-09-05 LAB — TSH: TSH: 5.249 u[IU]/mL — ABNORMAL HIGH (ref 0.350–4.500)

## 2014-09-06 LAB — PSA: PSA: 0.33 ng/mL (ref <=4.00)

## 2014-09-07 LAB — T3, FREE: T3 FREE: 3.7 pg/mL (ref 2.3–4.2)

## 2014-09-07 LAB — T4, FREE: Free T4: 0.77 ng/dL — ABNORMAL LOW (ref 0.80–1.80)

## 2014-09-14 ENCOUNTER — Ambulatory Visit (INDEPENDENT_AMBULATORY_CARE_PROVIDER_SITE_OTHER): Payer: 59 | Admitting: Family Medicine

## 2014-09-14 ENCOUNTER — Encounter: Payer: Self-pay | Admitting: Family Medicine

## 2014-09-14 VITALS — BP 132/86 | HR 56 | Temp 98.1°F | Resp 18 | Ht 68.25 in | Wt 201.0 lb

## 2014-09-14 DIAGNOSIS — Z0000 Encounter for general adult medical examination without abnormal findings: Secondary | ICD-10-CM | POA: Diagnosis not present

## 2014-09-14 DIAGNOSIS — E669 Obesity, unspecified: Secondary | ICD-10-CM | POA: Diagnosis not present

## 2014-09-14 DIAGNOSIS — R7989 Other specified abnormal findings of blood chemistry: Secondary | ICD-10-CM

## 2014-09-14 NOTE — Patient Instructions (Signed)
I recommend eye visit once a year I recommend dental visit every 6 months Goal is to  Exercise 30 minutes 5 days a week Get the thyroid lab done in 6 weeks (Mid Oct) F/U 1 year for physical

## 2014-09-14 NOTE — Assessment & Plan Note (Signed)
CPE done, immunizations, UTD, flu shot to be given at work Colonoscopy UTD Offered STD check as now split from girlfriend he declined

## 2014-09-14 NOTE — Progress Notes (Signed)
Patient ID: Eugene [name], male   DOB: 1955-06-20, 59 y.o.   MRN: [id number]   Subjective:    Patient ID: Eugene [name], male    DOB: [birth year], 59 y.o.   MRN: [id number]  Patient presents for Annual Exam  patient here for annual physical exam. He has no particular concerns today. He does tell me he is now separated from his girlfriend there were too many trust issues he feels better about this. He is not stressed out like he was previously. He is still working full time and he has his sons who has been helping out. He has actually gained about 20 pounds he states that his son moved in with him when he was separated from his girlfriend and they were eating a lot of fast food as well as a lot of bread and biscuits and he did not realize that he was not eating as well as previous. He also stopped exercising and he had stopped his multivitamins.  His fasting labs were reviewed his cholesterol panel PSA metabolic panel were all normal his thyroid was slightly abnormal his TSH was elevated at 5.2 in T4 was a little low at 0.77 both near borderline normal. T3 was normal. He denies any cold intolerance or change in his skin besides the weight gain no chest pain shortness of breath headaches or other thyroid symptoms    Review Of Systems:  GEN- denies fatigue, fever, weight loss,weakness, recent illness HEENT- denies eye drainage, change in vision, nasal discharge, CVS- denies chest pain, palpitations RESP- denies SOB, cough, wheeze ABD- denies N/V, change in stools, abd pain GU- denies dysuria, hematuria, dribbling, incontinence MSK- denies joint pain, muscle aches, injury Neuro- denies headache, dizziness, syncope, seizure activity       Objective:    BP 132/86 mmHg  Pulse 56  Temp(Src) 98.1 F (36.7 C) (Oral)  Resp 18  Ht 5' 8.25" (1.734 m)  Wt 201 lb (91.173 kg)  BMI 30.32 kg/m2 GEN- NAD, alert and oriented x3 HEENT- PERRL, EOMI, non injected sclera, pink conjunctiva, MMM, oropharynx  clear Neck- Supple, no thyromegaly, no nodules felt  CVS- RRR, no murmur RESP-CTAB ABD-NABS,soft,NT,ND GU- Rectum normal tone, prostate smooth, no nodules, mild enlargement, FOBT Negative  Psych- normal affect and mood  EXT- No edema Pulses- Radial, DP- 2+        Assessment & Plan:      Problem List Items Addressed This Visit    Routine general medical examination at a health care facility - Primary    CPE done, immunizations, UTD, flu shot to be given at work Colonoscopy UTD Offered STD check as now split from girlfriend he declined      Abnormal TSH    Plan to recheck TFT in 6 weeks, he was given lab slip, possible the weight changes are causing some problems with his thyroid and metabolism No nodules felt on exam       Relevant Orders   TSH   T3, free   T4, free      Note: This dictation was prepared with Dragon dictation along with smaller phrase technology. Any transcriptional errors that result from this process are unintentional.

## 2014-09-14 NOTE — Assessment & Plan Note (Signed)
Plan to recheck TFT in 6 weeks, he was given lab slip, possible the weight changes are causing some problems with his thyroid and metabolism No nodules felt on exam

## 2014-09-14 NOTE — Assessment & Plan Note (Signed)
Discussed healthy eating, change in diet and return to exercise, starting with 3 days a week, goal is to get the 20lbs back off Restart MVI as well

## 2014-11-01 LAB — T4, FREE: Free T4: 0.81 ng/dL (ref 0.80–1.80)

## 2014-11-01 LAB — T3, FREE: T3, Free: 3.5 pg/mL (ref 2.3–4.2)

## 2014-11-01 LAB — TSH: TSH: 3.039 u[IU]/mL (ref 0.350–4.500)

## 2014-11-05 ENCOUNTER — Encounter: Payer: Self-pay | Admitting: *Deleted

## 2015-07-26 ENCOUNTER — Telehealth: Payer: Self-pay | Admitting: Family Medicine

## 2015-07-26 MED ORDER — SILDENAFIL CITRATE 100 MG PO TABS: 50.0000 mg | ORAL_TABLET | Freq: Every day | ORAL | Status: DC | PRN

## 2015-07-26 NOTE — Telephone Encounter (Signed)
Okay to give sildenafil take 5 tablets 1 hour before intercourse (100mg ) Give 30 tablets R 1

## 2015-07-26 NOTE — Telephone Encounter (Signed)
Okay to send Viagra 100mg  6 tablets refill 1

## 2015-07-26 NOTE — Telephone Encounter (Signed)
Prescription sent to pharmacy.

## 2015-07-26 NOTE — Telephone Encounter (Signed)
Received call from [facility].   Reports that patient cannot afford Viagra, but is able to afford Sidenifil 20mg .   MD please advise.

## 2015-07-26 NOTE — Telephone Encounter (Signed)
Pt is requesting that we send over a prescription for Viagra to [facility].

## 2015-07-26 NOTE — Telephone Encounter (Signed)
MD please advise

## 2015-07-29 MED ORDER — SILDENAFIL CITRATE 20 MG PO TABS: ORAL_TABLET | ORAL | Status: DC

## 2015-07-29 NOTE — Addendum Note (Signed)
Addended by: Sheral Flow on: 07/29/2015 08:47 AM   Modules accepted: Orders, Medications

## 2015-07-29 NOTE — Telephone Encounter (Signed)
Prescription sent to pharmacy.

## 2015-10-18 ENCOUNTER — Ambulatory Visit (INDEPENDENT_AMBULATORY_CARE_PROVIDER_SITE_OTHER): Payer: 59 | Admitting: Family Medicine

## 2015-10-18 ENCOUNTER — Other Ambulatory Visit: Payer: 59

## 2015-10-18 ENCOUNTER — Encounter: Payer: Self-pay | Admitting: Family Medicine

## 2015-10-18 VITALS — BP 128/64 | HR 64 | Temp 98.8°F | Resp 16 | Ht 68.5 in | Wt 199.0 lb

## 2015-10-18 DIAGNOSIS — E669 Obesity, unspecified: Secondary | ICD-10-CM

## 2015-10-18 DIAGNOSIS — R946 Abnormal results of thyroid function studies: Secondary | ICD-10-CM | POA: Diagnosis not present

## 2015-10-18 DIAGNOSIS — J0110 Acute frontal sinusitis, unspecified: Secondary | ICD-10-CM

## 2015-10-18 DIAGNOSIS — Z0000 Encounter for general adult medical examination without abnormal findings: Secondary | ICD-10-CM

## 2015-10-18 DIAGNOSIS — R7989 Other specified abnormal findings of blood chemistry: Secondary | ICD-10-CM

## 2015-10-18 LAB — PSA: PSA: 0.4 ng/mL (ref <=4.0)

## 2015-10-18 LAB — COMPLETE METABOLIC PANEL WITH GFR
ALT: 30 U/L (ref 9–46)
AST: 30 U/L (ref 10–35)
Albumin: 3.6 g/dL (ref 3.6–5.1)
Alkaline Phosphatase: 64 U/L (ref 40–115)
BUN: 15 mg/dL (ref 7–25)
CO2: 25 mmol/L (ref 20–31)
Calcium: 9 mg/dL (ref 8.6–10.3)
Chloride: 101 mmol/L (ref 98–110)
Creat: 1.02 mg/dL (ref 0.70–1.33)
GFR, EST NON AFRICAN AMERICAN: 81 mL/min (ref >=60)
GFR, Est African American: >89 mL/min (ref >=60)
Glucose, Bld: 93 mg/dL (ref 70–99)
Potassium: 4 mmol/L (ref 3.5–5.3)
SODIUM: 135 mmol/L (ref 135–146)
Total Bilirubin: 0.8 mg/dL (ref 0.2–1.2)
Total Protein: 8.5 g/dL — ABNORMAL HIGH (ref 6.1–8.1)

## 2015-10-18 LAB — LIPID PANEL
CHOLESTEROL: 148 mg/dL (ref 125–200)
HDL: 57 mg/dL (ref >=40)
LDL Cholesterol: 83 mg/dL (ref <130)
TRIGLYCERIDES: 40 mg/dL (ref <150)
Total CHOL/HDL Ratio: 2.6 Ratio (ref <=5.0)
VLDL: 8 mg/dL (ref <30)

## 2015-10-18 LAB — CBC WITH DIFFERENTIAL/PLATELET
BASOS ABS: 0 {cells}/uL (ref 0–200)
Basophils Relative: 0 %
Eosinophils Absolute: 0 cells/uL — ABNORMAL LOW (ref 15–500)
Eosinophils Relative: 0 %
HCT: 37.5 % — ABNORMAL LOW (ref 38.5–50.0)
Hemoglobin: 12.6 g/dL — ABNORMAL LOW (ref 13.0–17.0)
Lymphocytes Relative: 32 %
Lymphs Abs: 3136 cells/uL (ref 850–3900)
MCH: 32.5 pg (ref 27.0–33.0)
MCHC: 33.6 g/dL (ref 32.0–36.0)
MCV: 96.6 fL (ref 80.0–100.0)
MPV: 9.6 fL (ref 7.5–12.5)
Monocytes Absolute: 1078 cells/uL — ABNORMAL HIGH (ref 200–950)
Monocytes Relative: 11 %
NEUTROS ABS: 5586 {cells}/uL (ref 1500–7800)
Neutrophils Relative %: 57 %
PLATELETS: 276 10*3/uL (ref 140–400)
RBC: 3.88 MIL/uL — ABNORMAL LOW (ref 4.20–5.80)
RDW: 13.4 % (ref 11.0–15.0)
WBC: 9.8 10*3/uL (ref 3.8–10.8)

## 2015-10-18 LAB — TSH: TSH: 1.51 mIU/L (ref 0.40–4.50)

## 2015-10-18 MED ORDER — FLUTICASONE PROPIONATE 50 MCG/ACT NA SUSP: 2.0000 | Freq: Every day | NASAL | 1 refills | Status: DC

## 2015-10-18 MED ORDER — AMOXICILLIN 875 MG PO TABS: 875.0000 mg | ORAL_TABLET | Freq: Two times a day (BID) | ORAL | 0 refills | Status: DC

## 2015-10-18 NOTE — Patient Instructions (Addendum)
F/U on Monday as scheduled   Get the sudafed take for 3-4 days decongest Use the flonase and antibiotics

## 2015-10-18 NOTE — Progress Notes (Signed)
   Subjective:    Patient ID: Eugene [name], male    DOB: 1955/11/04, 60 y.o.   MRN: [id number]  Patient presents for Illness (x1 wweek- sinus pressure, nasnal drainage with clear to green colored mucus, fever/ chills) pt here  sinus pressure drainage for the past week and a half. States mucous headache mostly on the left side in the frontal area. Denies any fever but has had some chills. No significant cough. He has been using over-the-counter sinus and headache medication with no improvement. No known sick contacts. He does work in a hospital in [place]:  GEN- denies fatigue, fever, weight loss,weakness, recent illness HEENT- denies eye drainage, change in vision, +nasal discharge, CVS- denies chest pain, palpitations RESP- denies SOB, cough, wheeze ABD- denies N/V, change in stools, abd pain GU- denies dysuria, hematuria, dribbling, incontinence MSK- denies joint pain, muscle aches, injury Neuro- denies headache, dizziness, syncope, seizure activity       Objective:    BP 128/64 (BP Location: Left Arm, Patient Position: Sitting, Cuff Size: Normal)   Pulse 64   Temp 98.8 F (37.1 C) (Oral)   Resp 16   Ht 5' 8.5" (1.74 m)   Wt 199 lb (90.3 kg)   BMI 29.82 kg/m  GEN- NAD, alert and oriented x3 HEENT- PERRL, EOMI, non injected sclera, pink conjunctiva, MMM, oropharynx clear, TM clear bilat no effusion,  + left frontal and ethmoid sinus tenderness, inflammed turbinates,  Nasal drainage  Neck- Supple, shotty ant  LAD CVS- RRR, no murmur RESP-CTAB Pulses- Radial 2+         Assessment & Plan:      Problem List Items Addressed This Visit    None    Visit Diagnoses    Acute non-recurrent frontal sinusitis    -  Primary   Treat with amoxicillin, flonase, sudafed   Relevant Medications   amoxicillin (AMOXIL) 875 MG tablet   fluticasone (FLONASE) 50 MCG/ACT nasal spray      Note: This dictation was prepared with Dragon dictation along with  smaller phrase technology. Any transcriptional errors that result from this process are unintentional.

## 2015-10-21 ENCOUNTER — Ambulatory Visit (INDEPENDENT_AMBULATORY_CARE_PROVIDER_SITE_OTHER): Payer: 59 | Admitting: Family Medicine

## 2015-10-21 ENCOUNTER — Encounter: Payer: Self-pay | Admitting: Family Medicine

## 2015-10-21 VITALS — BP 132/84 | Temp 98.8°F | Resp 16 | Ht 68.5 in | Wt 200.0 lb

## 2015-10-21 DIAGNOSIS — Z0000 Encounter for general adult medical examination without abnormal findings: Secondary | ICD-10-CM

## 2015-10-21 DIAGNOSIS — Z1159 Encounter for screening for other viral diseases: Secondary | ICD-10-CM | POA: Diagnosis not present

## 2015-10-21 DIAGNOSIS — Z113 Encounter for screening for infections with a predominantly sexual mode of transmission: Secondary | ICD-10-CM | POA: Diagnosis not present

## 2015-10-21 LAB — URINALYSIS, MICROSCOPIC ONLY
CASTS: NONE SEEN [LPF]
CRYSTALS: NONE SEEN [HPF]
SQUAMOUS EPITHELIAL / LPF: NONE SEEN [HPF] (ref <=5)
WBC, UA: NONE SEEN WBC/HPF (ref <=5)
Yeast: NONE SEEN [HPF]

## 2015-10-21 LAB — URINALYSIS, ROUTINE W REFLEX MICROSCOPIC
Bilirubin Urine: NEGATIVE
Glucose, UA: NEGATIVE
Ketones, ur: NEGATIVE
Leukocytes, UA: NEGATIVE
Nitrite: NEGATIVE
Protein, ur: NEGATIVE
Specific Gravity, Urine: 1.02 (ref 1.001–1.035)
pH: 6 (ref 5.0–8.0)

## 2015-10-21 NOTE — Patient Instructions (Signed)
I recommend eye visit once a year I recommend dental visit every 6 months Goal is to  Exercise 30 minutes 5 days a week We will send a letter with lab results  F/U 1 year for physical  

## 2015-10-21 NOTE — Progress Notes (Signed)
   Subjective:    Patient ID: Eugene [name], male    DOB: October 20, 1955, 60 y.o.   MRN: [id number]  Patient presents for Annual Exam  Patient here for annual exam. He has no specific concerns. He was seen last week for sick visit he is much improved. His only medication is Viagra as needed he typically takes between 60-100 mg prior to intercourse. His family history was updated Request STD check today  Colonoscopy UTD Immunizations UTD, Flu shot at work     Review Of Systems:  GEN- denies fatigue, fever, weight loss,weakness, recent illness HEENT- denies eye drainage, change in vision, nasal discharge, CVS- denies chest pain, palpitations RESP- denies SOB, cough, wheeze ABD- denies N/V, change in stools, abd pain GU- denies dysuria, hematuria, dribbling, incontinence MSK- denies joint pain, muscle aches, injury Neuro- denies headache, dizziness, syncope, seizure activity       Objective:    BP 132/84   Temp 98.8 F (37.1 C)   Resp 16   Ht 5' 8.5" (1.74 m)   Wt 200 lb (90.7 kg)   BMI 29.97 kg/m  GEN- NAD, alert and oriented x3 HEENT- PERRL, EOMI, non injected sclera, pink conjunctiva, MMM, oropharynx clear Neck- Supple, no thyromegaly CVS- RRR, no murmur RESP-CTAB ABD-NABS,soft,NT,ND EXT- No edema Pulses- Radial, DP- 2+        Assessment & Plan:      Problem List Items Addressed This Visit    Routine general medical examination at a health care facility    Other Visit Diagnoses    Screen for STD (sexually transmitted disease)    -  Primary   Relevant Orders   Urinalysis, Routine w reflex microscopic (not at [facility]) (Completed)   HIV antibody   RPR   GC/Chlamydia Probe Amp   Need for hepatitis C screening test       Relevant Orders   Hepatitis C antibody      Note: This dictation was prepared with Dragon dictation along with smaller phrase technology. Any transcriptional errors that result from this process are unintentional.

## 2015-10-22 LAB — HEPATITIS C ANTIBODY: HCV AB: NEGATIVE

## 2015-10-22 LAB — HIV ANTIBODY (ROUTINE TESTING W REFLEX): HIV: NONREACTIVE

## 2015-10-22 LAB — RPR

## 2015-10-22 LAB — GC/CHLAMYDIA PROBE AMP
CT PROBE, AMP APTIMA: NOT DETECTED
GC Probe RNA: NOT DETECTED

## 2015-11-18 DIAGNOSIS — J029 Acute pharyngitis, unspecified: Secondary | ICD-10-CM | POA: Diagnosis not present

## 2015-11-18 DIAGNOSIS — J069 Acute upper respiratory infection, unspecified: Secondary | ICD-10-CM | POA: Diagnosis not present

## 2015-11-18 DIAGNOSIS — J40 Bronchitis, not specified as acute or chronic: Secondary | ICD-10-CM | POA: Diagnosis not present

## 2016-03-05 ENCOUNTER — Other Ambulatory Visit: Payer: Self-pay | Admitting: Family Medicine

## 2016-03-05 NOTE — Telephone Encounter (Signed)
Ok to refill 

## 2016-03-06 NOTE — Telephone Encounter (Signed)
Okay to refill? 

## 2016-03-06 NOTE — Telephone Encounter (Signed)
Medication refilled per protocol. 

## 2016-10-19 ENCOUNTER — Emergency Department (HOSPITAL_COMMUNITY): Payer: Commercial Managed Care - PPO

## 2016-10-19 ENCOUNTER — Encounter (HOSPITAL_COMMUNITY): Payer: Self-pay | Admitting: *Deleted

## 2016-10-19 ENCOUNTER — Emergency Department (HOSPITAL_COMMUNITY)
Admission: EM | Admit: 2016-10-19 | Discharge: 2016-10-19 | Disposition: A | Discharge status: Home / Self Care | Payer: Commercial Managed Care - PPO | Attending: Emergency Medicine | Admitting: Emergency Medicine

## 2016-10-19 DIAGNOSIS — Y939 Activity, unspecified: Secondary | ICD-10-CM | POA: Insufficient documentation

## 2016-10-19 DIAGNOSIS — Y33XXXA Other specified events, undetermined intent, initial encounter: Secondary | ICD-10-CM | POA: Diagnosis not present

## 2016-10-19 DIAGNOSIS — Y998 Other external cause status: Secondary | ICD-10-CM | POA: Diagnosis not present

## 2016-10-19 DIAGNOSIS — R1084 Generalized abdominal pain: Secondary | ICD-10-CM | POA: Diagnosis present

## 2016-10-19 DIAGNOSIS — Z7982 Long term (current) use of aspirin: Secondary | ICD-10-CM | POA: Diagnosis not present

## 2016-10-19 DIAGNOSIS — S39012A Strain of muscle, fascia and tendon of lower back, initial encounter: Secondary | ICD-10-CM

## 2016-10-19 DIAGNOSIS — M549 Dorsalgia, unspecified: Secondary | ICD-10-CM | POA: Diagnosis not present

## 2016-10-19 DIAGNOSIS — Y929 Unspecified place or not applicable: Secondary | ICD-10-CM | POA: Insufficient documentation

## 2016-10-19 LAB — URINALYSIS, ROUTINE W REFLEX MICROSCOPIC
BILIRUBIN URINE: NEGATIVE
GLUCOSE, UA: NEGATIVE mg/dL
Hgb urine dipstick: NEGATIVE
KETONES UR: NEGATIVE mg/dL
Leukocytes, UA: NEGATIVE
NITRITE: NEGATIVE
PH: 5 (ref 5.0–8.0)
Protein, ur: NEGATIVE mg/dL
Specific Gravity, Urine: 1.019 (ref 1.005–1.030)

## 2016-10-19 MED ORDER — IBUPROFEN 800 MG PO TABS
800.0000 mg | ORAL_TABLET | Freq: Once | ORAL | Status: AC
  Administered 2016-10-19: 800 mg via ORAL
  Filled 2016-10-19: qty 1

## 2016-10-19 MED ORDER — CYCLOBENZAPRINE HCL 10 MG PO TABS: 10.0000 mg | ORAL_TABLET | Freq: Two times a day (BID) | ORAL | 0 refills | Status: DC | PRN

## 2016-10-19 NOTE — ED Triage Notes (Signed)
Pt c/o right flank pain for the last 2 weeks; pt states the pain has gotten worse; pt has been using heating pad and ibuprofen for the pain

## 2016-10-19 NOTE — Discharge Instructions (Signed)

## 2016-10-19 NOTE — ED Provider Notes (Signed)
[place] DEPT Provider Note   CSN: [id number] Arrival date & time: 10/19/16  0148     History   Chief Complaint Chief Complaint  Patient presents with  . Flank Pain    HPI Eugene [name] is a 61 y.o. male.  The history is provided by the patient.  Flank Pain  This is a new problem. The current episode started more than 1 week ago. The problem occurs daily. The problem has been gradually worsening. Pertinent negatives include no abdominal pain and no shortness of breath. The symptoms are aggravated by walking and twisting. The symptoms are relieved by rest. Treatments tried: ibuprofen. The treatment provided no relief.  pt reports he has had low back pain for about 2 weeks Tonight, it seemed to worsen and is mostly in right low back/flank No fever/vomiting No leg weakness He is ambulatory No dysuria No incontinence is reported No h/o surgery No falls reported  PMH -none Patient Active Problem List   Diagnosis Date Noted  . Abnormal TSH 09/14/2014  . Obesity 09/14/2014  . Routine general medical examination at a health care facility 08/24/2013  . Prostate cancer screening 08/24/2013  . ED (erectile dysfunction) 01/20/2011    History reviewed. No pertinent surgical history.     Home Medications    Prior to Admission medications   Medication Sig Start Date End Date Taking? Authorizing Provider  aspirin 81 MG tablet Take 81 mg by mouth daily.      [provider]  fluticasone (FLONASE) 50 MCG/ACT nasal spray Place 2 sprays into both nostrils daily. 10/18/15   Alycia Rossetti, MD  Multiple Vitamin (MULTIVITAMIN) tablet Take 1 tablet by mouth daily.      [provider]  sildenafil (REVATIO) 20 MG tablet TAKE 5 TABLETS ONE HOUR BEFORE INTERCOURSE 03/06/16   Alycia Rossetti, MD    Family History Family History  Problem Relation Age of Onset  . Cancer Mother        breast  . Diabetes Mother   . Diabetes Sister   . Hypertension Sister   .  Cancer Brother   . Mental illness Sister     Social History Social History  Substance Use Topics  . Smoking status: Never Smoker  . Smokeless tobacco: Never Used  . Alcohol use No     Allergies   Patient has no known allergies.   Review of Systems Review of Systems  Constitutional: Negative for fatigue, fever and unexpected weight change.  Respiratory: Negative for shortness of breath.   Gastrointestinal: Negative for abdominal pain and vomiting.  Genitourinary: Positive for flank pain. Negative for dysuria.  Musculoskeletal: Positive for back pain.  Neurological: Negative for weakness.  All other systems reviewed and are negative.    Physical Exam Updated Vital Signs BP (!) 163/87 (BP Location: Left Arm)   Pulse (!) 53   Temp 97.7 F (36.5 C) (Oral)   Resp 18   Ht 1.778 m (5\' 10" )   Wt 93.4 kg (206 lb)   SpO2 100%   BMI 29.56 kg/m   Physical Exam CONSTITUTIONAL: Well developed/well nourished HEAD: Normocephalic/atraumatic EYES: EOMI/PERRL ENMT: Mucous membranes moist NECK: supple no meningeal signs SPINE/BACK:entire spine nontender , right sided SI pain noted.  No bruising/crepitance/stepoffs noted to spine CV: S1/S2 noted, no murmurs/rubs/gallops noted LUNGS: Lungs are clear to auscultation bilaterally, no apparent distress ABDOMEN: soft, nontender, no rebound or guarding GU:no cva tenderness NEURO: Awake/alert, equal motor 5/5 strength noted with the following: hip flexion/knee flexion/extension,  foot dorsi/plantar flexion, great toe extension intact bilaterally, Pt is able to ambulate unassisted. EXTREMITIES: pulses normal, full ROM SKIN: warm, color normal PSYCH: no abnormalities of mood noted, alert and oriented to situation    ED Treatments / Results  Labs (all labs ordered are listed, but only abnormal results are displayed) Labs Reviewed  URINALYSIS, ROUTINE W REFLEX MICROSCOPIC    EKG  EKG Interpretation None       Radiology Dg  Lumbar Spine Complete  Result Date: 10/19/2016 CLINICAL DATA:  Right back and flank pain for 2 weeks, worsening. No trauma. EXAM: LUMBAR SPINE - COMPLETE 4+ VIEW COMPARISON:  None. FINDINGS: The lumbar vertebrae are normal in height. There is no spondylolysis or spondylolisthesis. No acute bony abnormality. No bone lesion or bony destruction. There is moderate lumbar degenerative disc change, at L5-S1. Sacroiliac joints are unremarkable. IMPRESSION: Lumbosacral degenerative disc changes.  No acute findings. Electronically Signed   By: Andreas Newport M.D.   On: 10/19/2016 02:53    Procedures Procedures (including critical care time)  Medications Ordered in ED Medications  ibuprofen (ADVIL,MOTRIN) tablet 800 mg (800 mg Oral Given 10/19/16 0233)     Initial Impression / Assessment and Plan / ED Course  I have reviewed the triage vital signs and the nursing notes.  Pertinent labs & imaging results that were available during my care of the patient were reviewed by me and considered in my medical decision making (see chart for details).     Pt stable He is ambulatory He appears to have musculoskeletal back pain No neuro deficits He is nontoxic Will d/c home Flexeril ordered at patient request We discussed strict ER return precautions Pt agreeable with plan Final Clinical Impressions(s) / ED Diagnoses   Final diagnoses:  Strain of lumbar region, initial encounter    New Prescriptions New Prescriptions   CYCLOBENZAPRINE (FLEXERIL) 10 MG TABLET    Take 1 tablet (10 mg total) by mouth 2 (two) times daily as needed for muscle spasms.     Ripley Fraise, MD 10/19/16 [phone]

## 2016-10-26 ENCOUNTER — Ambulatory Visit (INDEPENDENT_AMBULATORY_CARE_PROVIDER_SITE_OTHER): Payer: 59 | Admitting: Family Medicine

## 2016-10-26 ENCOUNTER — Encounter: Payer: Self-pay | Admitting: Family Medicine

## 2016-10-26 VITALS — BP 110/68 | HR 78 | Temp 98.3°F | Resp 16 | Ht 68.5 in | Wt 212.0 lb

## 2016-10-26 DIAGNOSIS — M5136 Other intervertebral disc degeneration, lumbar region: Secondary | ICD-10-CM | POA: Diagnosis not present

## 2016-10-26 DIAGNOSIS — M5432 Sciatica, left side: Secondary | ICD-10-CM

## 2016-10-26 DIAGNOSIS — M51369 Other intervertebral disc degeneration, lumbar region without mention of lumbar back pain or lower extremity pain: Secondary | ICD-10-CM

## 2016-10-26 DIAGNOSIS — M5431 Sciatica, right side: Secondary | ICD-10-CM | POA: Diagnosis not present

## 2016-10-26 MED ORDER — HYDROCODONE-ACETAMINOPHEN 5-325 MG PO TABS: 1.0000 | ORAL_TABLET | Freq: Four times a day (QID) | ORAL | 0 refills | Status: DC | PRN

## 2016-10-26 MED ORDER — METHYLPREDNISOLONE 4 MG PO TBPK: ORAL_TABLET | ORAL | 0 refills | Status: DC

## 2016-10-26 NOTE — Progress Notes (Signed)
    Subjective:    Patient ID: Eugene [name], male    DOB: [birth year], 61 y.o.   MRN: [id number]  Patient presents for Back and bilateral leg pain   2 weeks of back pain started radiating down right side, using NSAIDS, heating pack, pain was not improving so he went to ER  Xray - showed DDD lubroascral spine Given flexeril and ibuprofen which helps some he had a couple of days off for thinWent back Thursday , but then pain started going down left leg then moved in legs.the pain has been severe time where he feels like his legs to give out on him. 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 as well as lifting pulling pushing all day long     Review Of Systems:  GEN- denies fatigue, fever, weight loss,weakness, recent illness HEENT- denies eye drainage, change in vision, nasal discharge, CVS- denies chest pain, palpitations RESP- denies SOB, cough, wheeze ABD- denies N/V, change in stools, abd pain GU- denies dysuria, hematuria, dribbling, incontinence MSK-+ joint pain,+ muscle aches, injury Neuro- denies headache, dizziness, syncope, seizure activity       Objective:    BP 110/68   Pulse 78   Temp 98.3 F (36.8 C) (Oral)   Resp 16   Ht 5' 8.5" (1.74 m)   Wt 212 lb (96.2 kg)   SpO2 98%   BMI 31.77 kg/m  GEN- NAD, alert and oriented x3 CVS- RRR, no murmur RESP-CTAB ABD-NABS,soft,NT,ND MSK- Mild TTP bilat paraspinals, Spine NT, fair ROM, pain with flexion, neg SLR, fair ROM hips Neuro- normal tone, mild antalgic gait, DTR symmetric strength equal bilat 5/5         Assessment & Plan:      Problem List Items Addressed This Visit    None    Visit Diagnoses    DDD (degenerative disc disease), lumbar    -  Primary   DDD with sciatica, treat with medrol dosepak, norco at bedtime, flexeril, discussed need for weight loss,     Relevant Medications   methylPREDNISolone (MEDROL DOSEPAK) 4 MG TBPK tablet   HYDROcodone-acetaminophen (NORCO) 5-325 MG tablet   Bilateral sciatica          Note: This dictation was prepared with Dragon dictation along with smaller phrase technology. Any transcriptional errors that result from this process are unintentional.

## 2016-10-26 NOTE — Patient Instructions (Signed)
Take steroids as prescribed Use muscle relaxer  Use pain medicine at bedtime  F/U for physical as scheduled

## 2016-10-27 ENCOUNTER — Telehealth: Payer: Self-pay | Admitting: Family Medicine

## 2016-10-27 NOTE — Telephone Encounter (Signed)
Awaiting forms

## 2016-10-27 NOTE — Telephone Encounter (Signed)
Received fmla ppw will route this to christina for completion

## 2016-10-28 NOTE — Telephone Encounter (Signed)
Received fax from [facility] [employer] for FMLA forms.   Call placed to patient for more information.   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 Missed work on 10/20/2016, 10/21/2016, and 10/25/2016. Patient was seen in office on 10/26/2016. Patient has not returned to work at this time, but is planning on returning on 11/02/2016.  Verbalized that fee may be charged and is per provider prerogative.   Forms routed to provider.

## 2016-12-04 ENCOUNTER — Encounter: Payer: 59 | Admitting: Family Medicine

## 2016-12-11 ENCOUNTER — Other Ambulatory Visit: Payer: Self-pay | Admitting: Family Medicine

## 2016-12-11 NOTE — Telephone Encounter (Signed)
Okay to refill? 

## 2016-12-11 NOTE — Telephone Encounter (Signed)
Ok to refill 

## 2016-12-11 NOTE — Telephone Encounter (Signed)
Prescription sent to pharmacy.

## 2017-01-15 ENCOUNTER — Observation Stay (HOSPITAL_COMMUNITY)
Admission: EM | Admit: 2017-01-15 | Discharge: 2017-01-17 | Disposition: A | Discharge status: Home / Self Care | Payer: 59 | Attending: Cardiology | Admitting: Cardiology

## 2017-01-15 ENCOUNTER — Other Ambulatory Visit: Payer: Self-pay

## 2017-01-15 ENCOUNTER — Encounter (HOSPITAL_COMMUNITY)
Admission: EM | Disposition: A | Discharge status: Home / Self Care | Payer: Self-pay | Source: Home / Self Care | Attending: Emergency Medicine

## 2017-01-15 ENCOUNTER — Encounter (HOSPITAL_COMMUNITY): Payer: Self-pay

## 2017-01-15 ENCOUNTER — Emergency Department (HOSPITAL_COMMUNITY): Payer: 59

## 2017-01-15 DIAGNOSIS — Z79899 Other long term (current) drug therapy: Secondary | ICD-10-CM | POA: Insufficient documentation

## 2017-01-15 DIAGNOSIS — Z809 Family history of malignant neoplasm, unspecified: Secondary | ICD-10-CM | POA: Insufficient documentation

## 2017-01-15 DIAGNOSIS — R079 Chest pain, unspecified: Secondary | ICD-10-CM | POA: Diagnosis not present

## 2017-01-15 DIAGNOSIS — Z7982 Long term (current) use of aspirin: Secondary | ICD-10-CM | POA: Diagnosis not present

## 2017-01-15 DIAGNOSIS — Z8249 Family history of ischemic heart disease and other diseases of the circulatory system: Secondary | ICD-10-CM | POA: Insufficient documentation

## 2017-01-15 DIAGNOSIS — E669 Obesity, unspecified: Secondary | ICD-10-CM | POA: Insufficient documentation

## 2017-01-15 DIAGNOSIS — I214 Non-ST elevation (NSTEMI) myocardial infarction: Secondary | ICD-10-CM | POA: Diagnosis not present

## 2017-01-15 DIAGNOSIS — Z833 Family history of diabetes mellitus: Secondary | ICD-10-CM | POA: Diagnosis not present

## 2017-01-15 DIAGNOSIS — M549 Dorsalgia, unspecified: Secondary | ICD-10-CM | POA: Diagnosis not present

## 2017-01-15 DIAGNOSIS — J9811 Atelectasis: Secondary | ICD-10-CM | POA: Diagnosis not present

## 2017-01-15 DIAGNOSIS — Z6830 Body mass index (BMI) 30.0-30.9, adult: Secondary | ICD-10-CM | POA: Insufficient documentation

## 2017-01-15 DIAGNOSIS — I2510 Atherosclerotic heart disease of native coronary artery without angina pectoris: Secondary | ICD-10-CM | POA: Diagnosis not present

## 2017-01-15 DIAGNOSIS — Z818 Family history of other mental and behavioral disorders: Secondary | ICD-10-CM | POA: Diagnosis not present

## 2017-01-15 DIAGNOSIS — R112 Nausea with vomiting, unspecified: Secondary | ICD-10-CM | POA: Diagnosis not present

## 2017-01-15 DIAGNOSIS — Z7902 Long term (current) use of antithrombotics/antiplatelets: Secondary | ICD-10-CM | POA: Diagnosis not present

## 2017-01-15 DIAGNOSIS — N529 Male erectile dysfunction, unspecified: Secondary | ICD-10-CM | POA: Diagnosis present

## 2017-01-15 HISTORY — PX: LEFT HEART CATH AND CORONARY ANGIOGRAPHY: CATH118249

## 2017-01-15 HISTORY — DX: Atherosclerotic heart disease of native coronary artery without angina pectoris: I25.10

## 2017-01-15 HISTORY — DX: Male erectile dysfunction, unspecified: N52.9

## 2017-01-15 HISTORY — DX: Dorsalgia, unspecified: M54.9

## 2017-01-15 HISTORY — DX: Personal history of other medical treatment: Z92.89

## 2017-01-15 LAB — PROTIME-INR
INR: 1.02
PROTHROMBIN TIME: 13.3 s (ref 11.4–15.2)

## 2017-01-15 LAB — BASIC METABOLIC PANEL
ANION GAP: 11 (ref 5–15)
BUN: 19 mg/dL (ref 6–20)
CHLORIDE: 101 mmol/L (ref 101–111)
CO2: 23 mmol/L (ref 22–32)
CREATININE: 0.91 mg/dL (ref 0.61–1.24)
Calcium: 9.5 mg/dL (ref 8.9–10.3)
GFR calc non Af Amer: >60 mL/min (ref >60)
Glucose, Bld: 128 mg/dL — ABNORMAL HIGH (ref 65–99)
POTASSIUM: 3.6 mmol/L (ref 3.5–5.1)
Sodium: 135 mmol/L (ref 135–145)

## 2017-01-15 LAB — CBC
HEMATOCRIT: 42.3 % (ref 39.0–52.0)
HEMATOCRIT: 39.9 % (ref 39.0–52.0)
HEMOGLOBIN: 13.9 g/dL (ref 13.0–17.0)
HEMOGLOBIN: 13.2 g/dL (ref 13.0–17.0)
MCH: 32 pg (ref 26.0–34.0)
MCH: 31.6 pg (ref 26.0–34.0)
MCHC: 32.9 g/dL (ref 30.0–36.0)
MCHC: 33.1 g/dL (ref 30.0–36.0)
MCV: 97.2 fL (ref 78.0–100.0)
MCV: 95.5 fL (ref 78.0–100.0)
Platelets: 285 10*3/uL (ref 150–400)
Platelets: 276 10*3/uL (ref 150–400)
RBC: 4.35 MIL/uL (ref 4.22–5.81)
RBC: 4.18 MIL/uL — AB (ref 4.22–5.81)
RDW: 12.6 % (ref 11.5–15.5)
RDW: 12.6 % (ref 11.5–15.5)
WBC: 6.4 10*3/uL (ref 4.0–10.5)
WBC: 8.2 10*3/uL (ref 4.0–10.5)

## 2017-01-15 LAB — I-STAT TROPONIN, ED: Troponin i, poc: 0.3 ng/mL (ref 0.00–0.08)

## 2017-01-15 LAB — APTT: APTT: 34 s (ref 24–36)

## 2017-01-15 SURGERY — LEFT HEART CATH AND CORONARY ANGIOGRAPHY
Anesthesia: LOCAL

## 2017-01-15 MED ORDER — MIDAZOLAM HCL 2 MG/2ML IJ SOLN
INTRAMUSCULAR | Status: AC
  Filled 2017-01-15: qty 2

## 2017-01-15 MED ORDER — MIDAZOLAM HCL 2 MG/2ML IJ SOLN
INTRAMUSCULAR | Status: DC | PRN
  Administered 2017-01-15: 1 mg via INTRAVENOUS

## 2017-01-15 MED ORDER — HEPARIN SODIUM (PORCINE) 1000 UNIT/ML IJ SOLN
INTRAMUSCULAR | Status: AC
  Filled 2017-01-15: qty 1

## 2017-01-15 MED ORDER — NITROGLYCERIN 0.4 MG SL SUBL
SUBLINGUAL_TABLET | SUBLINGUAL | Status: AC
  Administered 2017-01-15: 0.4 mg via SUBLINGUAL
  Filled 2017-01-15: qty 1

## 2017-01-15 MED ORDER — HEPARIN (PORCINE) IN NACL 2-0.9 UNIT/ML-% IJ SOLN
INTRAMUSCULAR | Status: AC | PRN
  Administered 2017-01-15: 1000 mL

## 2017-01-15 MED ORDER — ONDANSETRON HCL 4 MG/2ML IJ SOLN: 4.0000 mg | Freq: Four times a day (QID) | INTRAMUSCULAR | Status: DC | PRN

## 2017-01-15 MED ORDER — HEPARIN (PORCINE) IN NACL 100-0.45 UNIT/ML-% IJ SOLN
1200.0000 [IU]/h | INTRAMUSCULAR | Status: DC
  Administered 2017-01-15: 1200 [IU]/h via INTRAVENOUS
  Filled 2017-01-15: qty 250

## 2017-01-15 MED ORDER — ASPIRIN 81 MG PO TABS: 81.0000 mg | ORAL_TABLET | Freq: Every day | ORAL | Status: DC

## 2017-01-15 MED ORDER — ATORVASTATIN CALCIUM 40 MG PO TABS
80.0000 mg | ORAL_TABLET | Freq: Every day | ORAL | Status: DC
  Administered 2017-01-16: 80 mg via ORAL
  Filled 2017-01-15 (×5): qty 2

## 2017-01-15 MED ORDER — IOPAMIDOL (ISOVUE-370) INJECTION 76%
INTRAVENOUS | Status: DC | PRN
  Administered 2017-01-15: 130 mL via INTRA_ARTERIAL

## 2017-01-15 MED ORDER — HEPARIN SODIUM (PORCINE) 5000 UNIT/ML IJ SOLN
5000.0000 [IU] | Freq: Three times a day (TID) | INTRAMUSCULAR | Status: DC
  Administered 2017-01-15 – 2017-01-16 (×2): 5000 [IU] via SUBCUTANEOUS
  Filled 2017-01-15 (×2): qty 1

## 2017-01-15 MED ORDER — SODIUM CHLORIDE 0.9 % WEIGHT BASED INFUSION
1.0000 mL/kg/h | INTRAVENOUS | Status: AC
  Administered 2017-01-15: 1 mL/kg/h via INTRAVENOUS

## 2017-01-15 MED ORDER — SODIUM CHLORIDE 0.9% FLUSH: 3.0000 mL | INTRAVENOUS | Status: DC | PRN

## 2017-01-15 MED ORDER — SODIUM CHLORIDE 0.9 % IV SOLN: 250.0000 mL | INTRAVENOUS | Status: DC | PRN

## 2017-01-15 MED ORDER — LIDOCAINE HCL (PF) 1 % IJ SOLN
INTRAMUSCULAR | Status: AC
  Filled 2017-01-15: qty 30

## 2017-01-15 MED ORDER — ATORVASTATIN CALCIUM 80 MG PO TABS: 80.0000 mg | ORAL_TABLET | Freq: Every day | ORAL | Status: DC

## 2017-01-15 MED ORDER — NITROGLYCERIN 0.4 MG SL SUBL: 0.4000 mg | SUBLINGUAL_TABLET | SUBLINGUAL | Status: DC | PRN

## 2017-01-15 MED ORDER — ASPIRIN EC 81 MG PO TBEC
81.0000 mg | DELAYED_RELEASE_TABLET | Freq: Every day | ORAL | Status: DC
  Administered 2017-01-16 – 2017-01-17 (×2): 81 mg via ORAL
  Filled 2017-01-15 (×2): qty 1

## 2017-01-15 MED ORDER — IOPAMIDOL (ISOVUE-370) INJECTION 76%
INTRAVENOUS | Status: AC
  Filled 2017-01-15: qty 50

## 2017-01-15 MED ORDER — NITROGLYCERIN 0.4 MG SL SUBL
0.4000 mg | SUBLINGUAL_TABLET | SUBLINGUAL | Status: AC | PRN
  Administered 2017-01-15 (×3): 0.4 mg via SUBLINGUAL
  Filled 2017-01-15: qty 1

## 2017-01-15 MED ORDER — HEPARIN (PORCINE) IN NACL 2-0.9 UNIT/ML-% IJ SOLN
INTRAMUSCULAR | Status: AC
  Filled 2017-01-15: qty 1000

## 2017-01-15 MED ORDER — VERAPAMIL HCL 2.5 MG/ML IV SOLN
INTRAVENOUS | Status: AC
  Filled 2017-01-15: qty 2

## 2017-01-15 MED ORDER — FENTANYL CITRATE (PF) 100 MCG/2ML IJ SOLN
INTRAMUSCULAR | Status: DC | PRN
  Administered 2017-01-15: 25 ug via INTRAVENOUS

## 2017-01-15 MED ORDER — SODIUM CHLORIDE 0.9% FLUSH
3.0000 mL | Freq: Two times a day (BID) | INTRAVENOUS | Status: DC
  Administered 2017-01-15 – 2017-01-16 (×3): 3 mL via INTRAVENOUS

## 2017-01-15 MED ORDER — LIDOCAINE HCL (PF) 1 % IJ SOLN
INTRAMUSCULAR | Status: DC | PRN
  Administered 2017-01-15: 5 mL

## 2017-01-15 MED ORDER — ASPIRIN 81 MG PO CHEW
324.0000 mg | CHEWABLE_TABLET | Freq: Once | ORAL | Status: AC
  Administered 2017-01-15: 324 mg via ORAL
  Filled 2017-01-15: qty 4

## 2017-01-15 MED ORDER — VERAPAMIL HCL 2.5 MG/ML IV SOLN
INTRAVENOUS | Status: DC | PRN
  Administered 2017-01-15: 10 mL via INTRA_ARTERIAL

## 2017-01-15 MED ORDER — ACETAMINOPHEN 325 MG PO TABS
650.0000 mg | ORAL_TABLET | ORAL | Status: DC | PRN
Start: 2017-01-15 — End: 2017-01-17
  Administered 2017-01-15: 650 mg via ORAL
  Filled 2017-01-15: qty 2

## 2017-01-15 MED ORDER — IOPAMIDOL (ISOVUE-370) INJECTION 76%
INTRAVENOUS | Status: AC
  Filled 2017-01-15: qty 100

## 2017-01-15 MED ORDER — HEPARIN SODIUM (PORCINE) 1000 UNIT/ML IJ SOLN
INTRAMUSCULAR | Status: DC | PRN
  Administered 2017-01-15: 4000 [IU] via INTRAVENOUS

## 2017-01-15 MED ORDER — HEPARIN BOLUS VIA INFUSION
4000.0000 [IU] | Freq: Once | INTRAVENOUS | Status: AC
  Administered 2017-01-15: 4000 [IU] via INTRAVENOUS

## 2017-01-15 MED ORDER — NITROGLYCERIN IN D5W 200-5 MCG/ML-% IV SOLN
0.0000 ug/min | INTRAVENOUS | Status: DC
  Administered 2017-01-15: 5 ug/min via INTRAVENOUS
  Filled 2017-01-15: qty 250

## 2017-01-15 MED ORDER — FENTANYL CITRATE (PF) 100 MCG/2ML IJ SOLN
INTRAMUSCULAR | Status: AC
  Filled 2017-01-15: qty 2

## 2017-01-15 SURGICAL SUPPLY — 11 items
CATH 5FR JL3.5 JR4 ANG PIG MP (CATHETERS) ×2 IMPLANT
CATH LAUNCHER 5F EBU3.5 (CATHETERS) ×2 IMPLANT
DEVICE RAD COMP TR BAND LRG (VASCULAR PRODUCTS) ×2 IMPLANT
GLIDESHEATH SLEND SS 6F .021 (SHEATH) ×2 IMPLANT
GUIDEWIRE INQWIRE 1.5J.035X260 (WIRE) ×1 IMPLANT
INQWIRE 1.5J .035X260CM (WIRE) ×2
KIT HEART LEFT (KITS) ×2 IMPLANT
PACK CARDIAC CATHETERIZATION (CUSTOM PROCEDURE TRAY) ×2 IMPLANT
SYR MEDRAD MARK V 150ML (SYRINGE) ×2 IMPLANT
TRANSDUCER W/STOPCOCK (MISCELLANEOUS) ×2 IMPLANT
TUBING CIL FLEX 10 FLL-RA (TUBING) ×2 IMPLANT

## 2017-01-15 NOTE — ED Notes (Signed)
Cath Lab notified [facility] and they're sending a truck for the Pt.

## 2017-01-15 NOTE — Progress Notes (Signed)
Lab called with critical troponin level  Paged on call PA  Pt non symptomatic, pt had cardiac cath today Lab placed order for re draw

## 2017-01-15 NOTE — Progress Notes (Signed)
Printed out exit education on Lipitor and provided to pt and pt family

## 2017-01-15 NOTE — ED Triage Notes (Signed)
Chest pain onset approx 2330, states also felt a little heartburn, took some acid meds without relief.

## 2017-01-15 NOTE — H&P (Signed)
History & Physical    Patient ID: Adilson Grafton MRN: [id number], DOB/AGE: [id number]   Admit date: 01/15/2017  Primary Care Provider: Alycia Rossetti, MD Primary Cardiologist: New to [facility] - Dr. Harl Bowie  Chief Complaint: Chest Pain  Patient Profile    Eugene [name] is a 62 y.o. male with no prior cardiac history who is being seeing today for evaluation of an NSTEMI.   History of Present Illness    Cedric Mcclaine presented to [facility] ED this morning for evaluation of chest pain which started the prior evening. He reports being in his usual state of health until then when he initially developed nausea and diaphoresis. His symptoms resolved but he awoke from sleep with intense discomfort along his sternum. Pain was not related to positional changes. Not reproducible on palpation. He took TUMS with no improvement in his symptoms and proceed to the ED for further evaluation.   He does not exercise regularly but is active at baseline as he works here at [employer] in [employer]. 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   He denies any known history of CAD, HTN, HLD, or Type 2 DM. No known family history of CAD. Does have family history of HTN and Type 2 DM. He denies any prior alcohol use, tobacco use, or recreational drug use. He denies any history of kidney or bleeding issues. No planned upcoming surgeries.   Labs show WBC of 6.4, Hgb 13.9, platelets 285, Na+ 135, K+ 3.6, and creatinine 0.91. Initial I-STAT troponin 0.30. CXR showing low lung volumes with bibasilar atelectasis. Initial EKG showing NSR, HR 62, with lateral TWI.   He has been started on Heparin. Has been administered SL NTG with significant improvement in his symptoms (from 8/10 to 2/10) but his pain continues to be present.   Past Medical History:  Diagnosis Date  . Back pain     History reviewed. No pertinent surgical history.   Medications Prior to  Admission: Prior to Admission medications   Medication Sig Start Date End Date Taking? Authorizing Provider  aspirin 81 MG tablet Take 81 mg by mouth daily.      [provider]  cyclobenzaprine (FLEXERIL) 10 MG tablet Take 1 tablet (10 mg total) by mouth 2 (two) times daily as needed for muscle spasms. 10/19/16   Ripley Fraise, MD  fluticasone (FLONASE) 50 MCG/ACT nasal spray Place 2 sprays into both nostrils daily. 10/18/15   Walden, Modena Nunnery, MD  HYDROcodone-acetaminophen (NORCO) 5-325 MG tablet Take 1 tablet by mouth every 6 (six) hours as needed for moderate pain. 10/26/16   Alycia Rossetti, MD  methylPREDNISolone (MEDROL DOSEPAK) 4 MG TBPK tablet Take as directed on package 10/26/16   Alycia Rossetti, MD  Multiple Vitamin (MULTIVITAMIN) tablet Take 1 tablet by mouth daily.      [provider]  sildenafil (REVATIO) 20 MG tablet TAKE 5 TABLETS ONE HOUR BEFORE INTERCOURSE 12/11/16   Alycia Rossetti, MD     Allergies:   No Known Allergies  Social History:   Social History   Socioeconomic History  . Marital status: Divorced    Spouse name: Not on file  . Number of children: Not on file  . Years of education: Not on file  . Highest education level: Not on file  Social Needs  . Financial resource strain: Not on file  . Food insecurity - worry: Not on file  . Food  insecurity - inability: Not on file  . Transportation needs - medical: Not on file  . Transportation needs - non-medical: Not on file  Occupational History  . Not on file  Tobacco Use  . Smoking status: Never Smoker  . Smokeless tobacco: Never Used  Substance and Sexual Activity  . Alcohol use: No  . Drug use: No  . Sexual activity: Yes  Other Topics Concern  . Not on file  Social History Narrative  . Not on file      Family History:   The patient's family history includes Cancer in his brother and mother; Diabetes in his mother and sister; Hypertension in his sister; Mental illness in  his sister.     Review of Systems    General:  No chills, fever, night sweats or weight changes.  Cardiovascular:  No dyspnea on exertion, edema, orthopnea, palpitations, paroxysmal nocturnal dyspnea. Positive for chest pain.  Dermatological: No rash, lesions/masses Respiratory: No cough, dyspnea Urologic: No hematuria, dysuria Abdominal:   No vomiting, diarrhea, bright red blood per rectum, melena, or hematemesis. Positive for nausea.  Neurologic:  No visual changes, wkns, changes in mental status. All other systems reviewed and are otherwise negative except as noted above.  Physical Exam    Vitals:   01/15/17 0630 01/15/17 0700 01/15/17 0730 01/15/17 0800  BP: 122/72 (!) 141/72 (!) 146/76 140/83  Pulse: 62 62 69 (!) 55  Resp: 13 18 16 18   Temp:      TempSrc:      SpO2: 99% 100% 100% 100%  Weight:      Height:       No intake or output data in the 24 hours ending 01/15/17 0814 Filed Weights   01/15/17 0531  Weight: 205 lb (93 kg)   Body mass index is 30.27 kg/m.   General: Well developed, well nourished African American male appearing in no acute distress. Head: Normocephalic, atraumatic, sclera non-icteric, no xanthomas, nares are without discharge. Dentition:  Neck: No carotid bruits. JVD not elevated.  Lungs: Respirations regular and unlabored, without wheezes or rales.  Heart: Regular rate and rhythm. No S3 or S4.  No murmur, no rubs, or gallops appreciated. Abdomen: Soft, non-tender, non-distended with normoactive bowel sounds. No hepatomegaly. No rebound/guarding. No obvious abdominal masses. Msk:  Strength and tone appear normal for age. No joint deformities or effusions. Extremities: No clubbing or cyanosis. No edema.  Distal pedal pulses are 2+ bilaterally. Neuro: Alert and oriented X 3. Moves all extremities spontaneously. No focal deficits noted. Psych:  Responds to questions appropriately with a normal affect. Skin: No rashes or lesions noted  Labs and  Radiology Studies    EKG:  The ECG that was done was personally reviewed and demonstrates NSR, HR 62, with lateral TWI.    Relevant CV Studies:  None on File  Laboratory Data:  Chemistry Recent Labs  Lab 01/15/17 0541  NA 135  K 3.6  CL 101  CO2 23  GLUCOSE 128*  BUN 19  CREATININE 0.91  CALCIUM 9.5  GFRNONAA >60  GFRAA >60  ANIONGAP 11    No results for input(s): PROT, ALBUMIN, AST, ALT, ALKPHOS, BILITOT in the last 168 hours. Hematology Recent Labs  Lab 01/15/17 0541  WBC 6.4  RBC 4.35  HGB 13.9  HCT 42.3  MCV 97.2  MCH 32.0  MCHC 32.9  RDW 12.6  PLT 285   Cardiac EnzymesNo results for input(s): TROPONINI in the last 168 hours.  Recent Labs  Lab 01/15/17 0554  TROPIPOC 0.30*    BNPNo results for input(s): BNP, PROBNP in the last 168 hours.  DDimer No results for input(s): DDIMER in the last 168 hours.  Radiology/Studies:  Dg Chest 2 View  Result Date: 01/15/2017 CLINICAL DATA:  Chest pain, onset last night. EXAM: CHEST  2 VIEW COMPARISON:  None. FINDINGS: Low lung volumes. The cardiomediastinal contours are normal. Heart size upper normal. Mild bibasilar atelectasis, right greater than left. Pulmonary vasculature is normal. No consolidation, pleural effusion, or pneumothorax. No acute osseous abnormalities are seen. IMPRESSION: Low lung volumes with bibasilar atelectasis. Electronically Signed   By: Jeb Levering M.D.   On: 01/15/2017 06:33    Assessment and Plan:   1. NSTEMI - patient developed new-onset chest pain overnight which awoke him from sleep. Relieved with SL NTG but still having pain at this time. Pain is not reproducible on palpation or improved with positional changes.  - no known history of CAD, HTN, HLD, or Type 2 DM. Will check FLP and Hgb A1c.  - labs show WBC of 6.4, Hgb 13.9, platelets 285, Na+ 135, K+ 3.6, and creatinine 0.91. I-STAT troponin 0.30. Initial EKG showing NSR, HR 62, with lateral TWI. Continue to cycle troponin  values.  - he has been started on IV Heparin and is receiving intermittent SL NTG with improvement in his symptoms. Will start IV NTG as he is still having pain at this time.  - With his presenting symptoms, EKG changes, and elevated troponin will plan for transfer to [facility] and a cardiac catheterization for definitive evaluation. The patient understands that risks include but are not limited to stroke (1 in 1000), death (1 in 81), kidney failure [usually temporary] (1 in 500), bleeding (1 in 200), allergic reaction [possibly serious] (1 in 200). Scheduled for catheterization later today.     For questions or updates, please contact [place] Please consult [email or web address] for contact info under Cardiology/STEMI.   Signed, Erma Heritage, PA-C 01/15/2017, 8:14 AM Pager: [phone] Attending note Patient seen and discussed with PA Ahmed Prima, I agree with her documenatation. Presents with NSTEMI. Initial medical therapy with ASA, hep gtt, and NG gtt due to ongoing chest pain. Borderline low HR's, hold beta blocker at this time, hold ACE-I until after cath and dye load. Will give high dose statin now, plan for transfer to [facility] for cath  Carlyle Dolly MD

## 2017-01-15 NOTE — ED Notes (Signed)
[facility] arrived at this time. 

## 2017-01-15 NOTE — ED Notes (Signed)
Called [facility] for transport to the Cath Lab as requested by EDP.  Spoke with Abbe Amsterdam at [facility] who states, "they are waiting to hear from the Cath Lab before they can pick the Pt up.

## 2017-01-15 NOTE — ED Notes (Signed)
Pt to xray

## 2017-01-15 NOTE — Progress Notes (Signed)
Pt's troponin is elevated, cath with non obstructive disease, will monitor.

## 2017-01-15 NOTE — ED Notes (Signed)
[facility] PA present at bedside at this time.

## 2017-01-15 NOTE — Progress Notes (Signed)
ANTICOAGULATION CONSULT NOTE - Initial Consult  Pharmacy Consult for heparin Indication: chest pain/ACS  No Known Allergies  Patient Measurements: Height: 5\' 9"  (175.3 cm) Weight: 205 lb (93 kg) IBW/kg (Calculated) : 70.7 Heparin Dosing Weight: 89.8  Vital Signs: Temp: 98 F (36.7 C) (01/04 0530) Temp Source: Oral (01/04 0530) BP: 148/69 (01/04 0530) Pulse Rate: 65 (01/04 0530)  Labs: Recent Labs    01/15/17 0541  HGB 13.9  HCT 42.3  PLT 285  CREATININE 0.91    Estimated Creatinine Clearance: 98.4 mL/min (by C-G formula based on SCr of 0.91 mg/dL).   Medical History: History reviewed. No pertinent past medical history. No history of bleeding/stroke noted.  Medications: see PTA med list, no anticoagulants noted  Assessment:  Goal of Therapy:  Heparin level 0.3-0.7 units/ml Monitor platelets by anticoagulation protocol: Yes   Plan:  Give 4000 units bolus x 1 Start heparin infusion at 1200 units/hr Check anti-Xa level in 6 hours and daily while on heparin Continue to monitor H&H and platelets  Vonda Antigua 01/15/2017,7:00 AM

## 2017-01-15 NOTE — Progress Notes (Signed)
Cecilie Kicks cardiology PA stated pt cath was clean and to continue monitoring pt Lab aware

## 2017-01-15 NOTE — ED Notes (Signed)
Report given to [facility] in the CathLab at this time.

## 2017-01-15 NOTE — Interval H&P Note (Signed)
History and Physical Interval Note:  01/15/2017 10:47 AM  Eugene [name]  has presented today for surgery, with the diagnosis of cp  The various methods of treatment have been discussed with the patient and family. After consideration of risks, benefits and other options for treatment, the patient has consented to  Procedure(s): LEFT HEART CATH AND CORONARY ANGIOGRAPHY (N/A) as a surgical intervention .  The patient's history has been reviewed, patient examined, no change in status, stable for surgery.  I have reviewed the patient's chart and labs.  Questions were answered to the patient's satisfaction.   Cath Lab Visit (complete for each Cath Lab visit)  Clinical Evaluation Leading to the Procedure:   ACS: Yes.    Non-ACS:    Anginal Classification: CCS IV  Anti-ischemic medical therapy: No Therapy  Non-Invasive Test Results: No non-invasive testing performed  Prior CABG: No previous CABG        Collier Salina [facility] 01/15/2017 10:47 AM

## 2017-01-15 NOTE — ED Provider Notes (Signed)
[facility] Provider Note   CSN: [id number] Arrival date & time: 01/15/17  0522     History   Chief Complaint Chief Complaint  Patient presents with  . Chest Pain    HPI Eugene [name] is a 62 y.o. male.  The history is provided by the patient.  Chest Pain   This is a new problem. The current episode started 6 to 12 hours ago. The problem occurs constantly. The problem has not changed since onset.The pain is moderate. The quality of the pain is described as pressure-like. The pain does not radiate. Associated symptoms include diaphoresis, nausea and vomiting. He has tried antacids for the symptoms. The treatment provided no relief.   Patient reports at 11:30 PM last night he had onset of chest pressure, diaphoresis and vomiting He reports taking medicine for heartburn without any improvement He has never had this before  Patient Active Problem List   Diagnosis Date Noted  . Abnormal TSH 09/14/2014  . Obesity 09/14/2014  . Routine general medical examination at a health care facility 08/24/2013  . Prostate cancer screening 08/24/2013  . ED (erectile dysfunction) 01/20/2011    History reviewed. No pertinent surgical history.     Home Medications    Prior to Admission medications   Medication Sig Start Date End Date Taking? Authorizing Provider  aspirin 81 MG tablet Take 81 mg by mouth daily.      [provider]  cyclobenzaprine (FLEXERIL) 10 MG tablet Take 1 tablet (10 mg total) by mouth 2 (two) times daily as needed for muscle spasms. 10/19/16   Ripley Fraise, MD  fluticasone (FLONASE) 50 MCG/ACT nasal spray Place 2 sprays into both nostrils daily. 10/18/15   Anderson, Modena Nunnery, MD  HYDROcodone-acetaminophen (NORCO) 5-325 MG tablet Take 1 tablet by mouth every 6 (six) hours as needed for moderate pain. 10/26/16   Alycia Rossetti, MD  methylPREDNISolone (MEDROL DOSEPAK) 4 MG TBPK tablet Take as directed on package 10/26/16   Alycia Rossetti, MD  Multiple Vitamin (MULTIVITAMIN) tablet Take 1 tablet by mouth daily.      [provider]  sildenafil (REVATIO) 20 MG tablet TAKE 5 TABLETS ONE HOUR BEFORE INTERCOURSE 12/11/16   Alycia Rossetti, MD    Family History Family History  Problem Relation Age of Onset  . Cancer Mother        breast  . Diabetes Mother   . Diabetes Sister   . Hypertension Sister   . Cancer Brother   . Mental illness Sister     Social History Social History   Tobacco Use  . Smoking status: Never Smoker  . Smokeless tobacco: Never Used  Substance Use Topics  . Alcohol use: No  . Drug use: No     Allergies   Patient has no known allergies.   Review of Systems Review of Systems  Constitutional: Positive for diaphoresis.  Cardiovascular: Positive for chest pain.  Gastrointestinal: Positive for nausea and vomiting. Negative for blood in stool.  All other systems reviewed and are negative.    Physical Exam Updated Vital Signs BP (!) 148/69 (BP Location: Right Arm)   Pulse 65   Temp 98 F (36.7 C) (Oral)   Resp 16   Ht 1.753 m (5\' 9" )   Wt 93 kg (205 lb)   SpO2 99%   BMI 30.27 kg/m   Physical Exam CONSTITUTIONAL: Well developed/well nourished HEAD: Normocephalic/atraumatic EYES: EOMI/PERRL ENMT: Mucous membranes moist NECK: supple no meningeal signs  SPINE/BACK:entire spine nontender CV: S1/S2 noted, no murmurs/rubs/gallops noted LUNGS: Lungs are clear to auscultation bilaterally, no apparent distress ABDOMEN: soft, nontender, no rebound or guarding, bowel sounds noted throughout abdomen GU:no cva tenderness NEURO: Pt is awake/alert/appropriate, moves all extremitiesx4.  No facial droop.   EXTREMITIES: pulses normal/equalx4, full ROM SKIN: warm, color normal PSYCH: no abnormalities of mood noted, alert and oriented to situation   ED Treatments / Results  Labs (all labs ordered are listed, but only abnormal results are displayed) Labs Reviewed  BASIC  METABOLIC PANEL - Abnormal; Notable for the following components:      Result Value   Glucose, Bld 128 (*)    All other components within normal limits  I-STAT TROPONIN, ED - Abnormal; Notable for the following components:   Troponin i, poc 0.30 (*)    All other components within normal limits  CBC  APTT  PROTIME-INR  HEPARIN LEVEL (UNFRACTIONATED)    EKG ED ECG REPORT   Date: 01/15/2017 0534  Rate: 63  Rhythm: normal sinus rhythm  QRS Axis: normal  Intervals: normal  ST/T Wave abnormalities: nonspecific ST changes  Conduction Disutrbances:none  Narrative Interpretation:   Old EKG Reviewed: none available  I have personally reviewed the EKG tracing and agree with the computerized printout as noted.  Radiology Dg Chest 2 View  Result Date: 01/15/2017 CLINICAL DATA:  Chest pain, onset last night. EXAM: CHEST  2 VIEW COMPARISON:  None. FINDINGS: Low lung volumes. The cardiomediastinal contours are normal. Heart size upper normal. Mild bibasilar atelectasis, right greater than left. Pulmonary vasculature is normal. No consolidation, pleural effusion, or pneumothorax. No acute osseous abnormalities are seen. IMPRESSION: Low lung volumes with bibasilar atelectasis. Electronically Signed   By: Jeb Levering M.D.   On: 01/15/2017 06:33    Procedures Procedures  CRITICAL CARE Performed by: Sharyon Cable Total critical care time: 35 minutes Critical care time was exclusive of separately billable procedures and treating other patients. Critical care was necessary to treat or prevent imminent or life-threatening deterioration. Critical care was time spent personally by me on the following activities: development of treatment plan with patient and/or surrogate as well as nursing, discussions with consultants, evaluation of patient's response to treatment, examination of patient, obtaining history from patient or surrogate, ordering and performing treatments and interventions,  ordering and review of laboratory studies, ordering and review of radiographic studies, pulse oximetry and re-evaluation of patient's condition.   Medications Ordered in ED Medications  nitroGLYCERIN (NITROSTAT) SL tablet 0.4 mg (0.4 mg Sublingual Given 01/15/17 0619)  heparin bolus via infusion 4,000 Units (not administered)  heparin ADULT infusion 100 units/mL (25000 units/226mL sodium chloride 0.45%) (not administered)  aspirin chewable tablet 324 mg (324 mg Oral Given 01/15/17 0608)     Initial Impression / Assessment and Plan / ED Course  I have reviewed the triage vital signs and the nursing notes.  Pertinent labs & imaging results that were available during my care of the patient were reviewed by me and considered in my medical decision making (see chart for details).     7:02 AM Patient with episode of chest pain last night with associated nausea vomiting diaphoresis. He is now improved after aspirin/nitroglycerin He has EKG changes and elevated troponin, will consult cardiology Will also start heparin At this point no clear signs of STEMI 7:11 AM Spoke to both Dr. Martinique and Dr. Johnsie Cancel with cardiology Plan is to start heparin, order stepdown bed at [facility], and transfer If his  bed is delayed, the plan will be to transfer him directly to cardiac Cath Lab 7:15 AM EKG would not upload to epic, will place photos in chart     Last EKG above     1st EKG above  Final Clinical Impressions(s) / ED Diagnoses   Final diagnoses:  NSTEMI (non-ST elevated myocardial infarction) [facility])    ED Discharge Orders    None       Ripley Fraise, MD 01/15/17 [phone]

## 2017-01-16 ENCOUNTER — Observation Stay (HOSPITAL_BASED_OUTPATIENT_CLINIC_OR_DEPARTMENT_OTHER): Payer: 59

## 2017-01-16 DIAGNOSIS — I214 Non-ST elevation (NSTEMI) myocardial infarction: Secondary | ICD-10-CM | POA: Diagnosis not present

## 2017-01-16 DIAGNOSIS — M549 Dorsalgia, unspecified: Secondary | ICD-10-CM | POA: Diagnosis not present

## 2017-01-16 DIAGNOSIS — Z7982 Long term (current) use of aspirin: Secondary | ICD-10-CM | POA: Diagnosis not present

## 2017-01-16 DIAGNOSIS — Z79899 Other long term (current) drug therapy: Secondary | ICD-10-CM | POA: Diagnosis not present

## 2017-01-16 DIAGNOSIS — Z833 Family history of diabetes mellitus: Secondary | ICD-10-CM | POA: Diagnosis not present

## 2017-01-16 DIAGNOSIS — Z8249 Family history of ischemic heart disease and other diseases of the circulatory system: Secondary | ICD-10-CM | POA: Diagnosis not present

## 2017-01-16 DIAGNOSIS — I2510 Atherosclerotic heart disease of native coronary artery without angina pectoris: Secondary | ICD-10-CM | POA: Diagnosis not present

## 2017-01-16 DIAGNOSIS — E669 Obesity, unspecified: Secondary | ICD-10-CM | POA: Diagnosis not present

## 2017-01-16 DIAGNOSIS — Z6830 Body mass index (BMI) 30.0-30.9, adult: Secondary | ICD-10-CM | POA: Diagnosis not present

## 2017-01-16 LAB — CBC
HCT: 39.1 % (ref 39.0–52.0)
HEMOGLOBIN: 12.8 g/dL — AB (ref 13.0–17.0)
MCH: 31.7 pg (ref 26.0–34.0)
MCHC: 32.7 g/dL (ref 30.0–36.0)
MCV: 96.8 fL (ref 78.0–100.0)
Platelets: 275 10*3/uL (ref 150–400)
RBC: 4.04 MIL/uL — AB (ref 4.22–5.81)
RDW: 12.7 % (ref 11.5–15.5)
WBC: 5.5 10*3/uL (ref 4.0–10.5)

## 2017-01-16 LAB — BASIC METABOLIC PANEL
ANION GAP: 8 (ref 5–15)
BUN: 15 mg/dL (ref 6–20)
CALCIUM: 8.9 mg/dL (ref 8.9–10.3)
CO2: 25 mmol/L (ref 22–32)
Chloride: 103 mmol/L (ref 101–111)
Creatinine, Ser: 0.97 mg/dL (ref 0.61–1.24)
Glucose, Bld: 105 mg/dL — ABNORMAL HIGH (ref 65–99)
Potassium: 4.1 mmol/L (ref 3.5–5.1)
SODIUM: 136 mmol/L (ref 135–145)

## 2017-01-16 LAB — LIPID PANEL
Cholesterol: 146 mg/dL (ref 0–200)
HDL: 42 mg/dL (ref >40)
LDL Cholesterol: 91 mg/dL (ref 0–99)
TRIGLYCERIDES: 64 mg/dL (ref <150)
Total CHOL/HDL Ratio: 3.5 RATIO
VLDL: 13 mg/dL (ref 0–40)

## 2017-01-16 LAB — TROPONIN I
TROPONIN I: 3.48 ng/mL — AB (ref <0.03)
Troponin I: 5.58 ng/mL (ref <0.03)

## 2017-01-16 LAB — D-DIMER, QUANTITATIVE: D-Dimer, Quant: 0.28 ug/mL-FEU (ref 0.00–0.50)

## 2017-01-16 LAB — HEMOGLOBIN A1C
HEMOGLOBIN A1C: 4.9 % (ref 4.8–5.6)
MEAN PLASMA GLUCOSE: 93.93 mg/dL

## 2017-01-16 LAB — HIV ANTIBODY (ROUTINE TESTING W REFLEX): HIV Screen 4th Generation wRfx: NONREACTIVE

## 2017-01-16 MED ORDER — CLOPIDOGREL BISULFATE 75 MG PO TABS
75.0000 mg | ORAL_TABLET | Freq: Every day | ORAL | Status: DC
  Administered 2017-01-17: 75 mg via ORAL
  Filled 2017-01-16: qty 1

## 2017-01-16 MED ORDER — METOPROLOL SUCCINATE ER 25 MG PO TB24
25.0000 mg | ORAL_TABLET | Freq: Every day | ORAL | Status: DC
  Administered 2017-01-16 – 2017-01-17 (×2): 25 mg via ORAL
  Filled 2017-01-16 (×2): qty 1

## 2017-01-16 MED ORDER — CLOPIDOGREL BISULFATE 75 MG PO TABS
300.0000 mg | ORAL_TABLET | Freq: Once | ORAL | Status: AC
  Administered 2017-01-16: 300 mg via ORAL
  Filled 2017-01-16: qty 4

## 2017-01-16 MED ORDER — HEPARIN SODIUM (PORCINE) 5000 UNIT/ML IJ SOLN
5000.0000 [IU] | Freq: Three times a day (TID) | INTRAMUSCULAR | Status: DC
  Administered 2017-01-16 – 2017-01-17 (×4): 5000 [IU] via SUBCUTANEOUS
  Filled 2017-01-16 (×4): qty 1

## 2017-01-16 MED ORDER — LISINOPRIL 2.5 MG PO TABS
2.5000 mg | ORAL_TABLET | Freq: Every day | ORAL | Status: DC
  Administered 2017-01-16 – 2017-01-17 (×2): 2.5 mg via ORAL
  Filled 2017-01-16 (×2): qty 1

## 2017-01-16 NOTE — Progress Notes (Signed)
Patient refused bed alarm. Will continue to monitor patient. 

## 2017-01-16 NOTE — Progress Notes (Signed)
  Echocardiogram 2D Echocardiogram has been performed.  Eugene [name] Eugene [name] 01/16/2017, 6:25 PM

## 2017-01-16 NOTE — Progress Notes (Signed)
Confirmed with patient he wishes to speak to MD about statin drugs prior to taking, he agrees to take newly ordered plavix. Will dose with morning meds, unless advised otherwise of possible repeat cath per mention in Dr Ferdinand Lango note.

## 2017-01-16 NOTE — Plan of Care (Signed)
  Pain Managment: General experience of comfort will improve 01/16/2017 0352 - Completed/Met by Evert Kohl, RN

## 2017-01-16 NOTE — Plan of Care (Signed)
  Education: Knowledge of General Education information will improve 01/16/2017 0348 - Completed/Met by Evert Kohl, RN

## 2017-01-16 NOTE — Progress Notes (Addendum)
Pt resting comfortably, denies complaints.

## 2017-01-16 NOTE — Progress Notes (Signed)
Troponin elevated to 5.58 and Ecg shows T wave inversion in anterior leads. I reviewed cath data with Dr. Irish Lack. There is some concern of hypodensity in the proximal LAD which could represent plaque erosion +/- thrombus. This does not appear obstructive. Would recommend DAPT, beta blocker, statin. If patient has recurrent chest pain I would have a low threshold for repeat angiography.   Evanna Washinton Martinique MD, [facility]

## 2017-01-16 NOTE — Progress Notes (Addendum)
Progress Note  Patient Name: Eugene [name] Date of Encounter: 01/16/2017  Primary Cardiologist: No primary care provider on file.   Subjective   No complaints  Inpatient Medications    Scheduled Meds: . aspirin EC  81 mg Oral Daily  . atorvastatin  80 mg Oral q1800  . clopidogrel  300 mg Oral Once  . [START ON 01/17/2017] clopidogrel  75 mg Oral Daily  . heparin  5,000 Units Subcutaneous Q8H  . metoprolol succinate  25 mg Oral Daily  . sodium chloride flush  3 mL Intravenous Q12H   Continuous Infusions: . sodium chloride     PRN Meds: sodium chloride, acetaminophen, nitroGLYCERIN, ondansetron (ZOFRAN) IV, sodium chloride flush   Vital Signs    Vitals:   01/15/17 1655 01/15/17 1953 01/16/17 0013 01/16/17 0458  BP: 131/71 124/63 110/65 121/61  Pulse: 63 61 70 64  Resp: 18 18 18 18   Temp:  97.7 F (36.5 C) 98.2 F (36.8 C) 98.5 F (36.9 C)  TempSrc:  Oral Oral   SpO2:  99% 96% 98%  Weight:    204 lb 8 oz (92.8 kg)  Height:        Intake/Output Summary (Last 24 hours) at 01/16/2017 0835 Last data filed at 01/16/2017 0827 Gross per 24 hour  Intake 1248.3 ml  Output 1600 ml  Net -351.7 ml   Filed Weights   01/15/17 0531 01/15/17 1451 01/16/17 0458  Weight: 205 lb (93 kg) 205 lb 3.2 oz (93.1 kg) 204 lb 8 oz (92.8 kg)    Telemetry    SR - Personally Reviewed  ECG    n/a Physical Exam   GEN: No acute distress.   Neck: No JVD Cardiac: RRR, no murmurs, rubs, or gallops.  Respiratory: Clear to auscultation bilaterally. GI: Soft, nontender, non-distended  MS: No edema; No deformity. Neuro:  Nonfocal  Psych: Normal affect   Labs    Chemistry Recent Labs  Lab 01/15/17 0541 01/16/17 0256  NA 135 136  K 3.6 4.1  CL 101 103  CO2 23 25  GLUCOSE 128* 105*  BUN 19 15  CREATININE 0.91 0.97  CALCIUM 9.5 8.9  GFRNONAA >60 >60  GFRAA >60 >60  ANIONGAP 11 8     Hematology Recent Labs  Lab 01/15/17 0541 01/15/17 1524  WBC 6.4 8.2  RBC 4.35 4.18*    HGB 13.9 13.2  HCT 42.3 39.9  MCV 97.2 95.5  MCH 32.0 31.6  MCHC 32.9 33.1  RDW 12.6 12.6  PLT 285 276    Cardiac Enzymes Recent Labs  Lab 01/16/17 0256  TROPONINI 5.58*    Recent Labs  Lab 01/15/17 0554  TROPIPOC 0.30*     BNPNo results for input(s): BNP, PROBNP in the last 168 hours.   DDimer No results for input(s): DDIMER in the last 168 hours.   Radiology    Dg Chest 2 View  Result Date: 01/15/2017 CLINICAL DATA:  Chest pain, onset last night. EXAM: CHEST  2 VIEW COMPARISON:  None. FINDINGS: Low lung volumes. The cardiomediastinal contours are normal. Heart size upper normal. Mild bibasilar atelectasis, right greater than left. Pulmonary vasculature is normal. No consolidation, pleural effusion, or pneumothorax. No acute osseous abnormalities are seen. IMPRESSION: Low lung volumes with bibasilar atelectasis. Electronically Signed   By: Jeb Levering M.D.   On: 01/15/2017 06:33    Cardiac Studies    Patient Profile       Eugene [name] is a 62 y.o. male with no  prior cardiac history who is being seeing today for evaluation of an NSTEMI.     Assessment & Plan    1. Chest pain/NSTEMI - trop up to 5.58 (not a clear peak). Cath yesterday without significant CAD - medical therapy for NSTEMI with ASA, atorva 80, plavix 75, Toprol XL. Start lisinopril 2.5 mg daily.  - echo pending today  - from Dr Morrison Old note this AM, question about possible hypodensity in proximal LAD which could represent plaque erosion +/thrombus, does not appear obstructive. Recommendations for medical therapy.  - we will repeat troponin to establish peak, f/u echo. Also add D-dimer and if positive obtain CT PE - if troponin still trending up will restart heparin gtt today. Monitor patient overnight, if remains asymptomatic with stable echo and trending down troponin would plan for discharge tomorrow. Also noted this AM worsening T-wave inversions precordial leads, I think if he has continued  uptrending troponins or recurrent symptoms will have low threshold for repeat cath as recommended by Dr Martinique.      For questions or updates, please contact [place] Please consult [email or web address] for contact info under Cardiology/STEMI.      Merrily Pew, MD  01/16/2017, 8:35 AM

## 2017-01-16 NOTE — Plan of Care (Signed)
  Nutrition: Adequate nutrition will be maintained 01/16/2017 0350 - Completed/Met by Evert Kohl, RN

## 2017-01-17 ENCOUNTER — Encounter (HOSPITAL_COMMUNITY): Payer: Self-pay | Admitting: Nurse Practitioner

## 2017-01-17 ENCOUNTER — Other Ambulatory Visit: Payer: Self-pay

## 2017-01-17 DIAGNOSIS — E669 Obesity, unspecified: Secondary | ICD-10-CM | POA: Diagnosis not present

## 2017-01-17 DIAGNOSIS — I2510 Atherosclerotic heart disease of native coronary artery without angina pectoris: Secondary | ICD-10-CM

## 2017-01-17 DIAGNOSIS — Z79899 Other long term (current) drug therapy: Secondary | ICD-10-CM | POA: Diagnosis not present

## 2017-01-17 DIAGNOSIS — I214 Non-ST elevation (NSTEMI) myocardial infarction: Secondary | ICD-10-CM | POA: Diagnosis not present

## 2017-01-17 DIAGNOSIS — Z8249 Family history of ischemic heart disease and other diseases of the circulatory system: Secondary | ICD-10-CM | POA: Diagnosis not present

## 2017-01-17 DIAGNOSIS — Z6830 Body mass index (BMI) 30.0-30.9, adult: Secondary | ICD-10-CM | POA: Diagnosis not present

## 2017-01-17 DIAGNOSIS — Z7982 Long term (current) use of aspirin: Secondary | ICD-10-CM | POA: Diagnosis not present

## 2017-01-17 DIAGNOSIS — Z833 Family history of diabetes mellitus: Secondary | ICD-10-CM | POA: Diagnosis not present

## 2017-01-17 DIAGNOSIS — M549 Dorsalgia, unspecified: Secondary | ICD-10-CM | POA: Diagnosis not present

## 2017-01-17 LAB — BASIC METABOLIC PANEL
ANION GAP: 10 (ref 5–15)
BUN: 19 mg/dL (ref 6–20)
CO2: 21 mmol/L — ABNORMAL LOW (ref 22–32)
Calcium: 9 mg/dL (ref 8.9–10.3)
Chloride: 102 mmol/L (ref 101–111)
Creatinine, Ser: 1.02 mg/dL (ref 0.61–1.24)
GFR calc Af Amer: >60 mL/min (ref >60)
GLUCOSE: 101 mg/dL — AB (ref 65–99)
POTASSIUM: 4.2 mmol/L (ref 3.5–5.1)
Sodium: 133 mmol/L — ABNORMAL LOW (ref 135–145)

## 2017-01-17 LAB — ECHOCARDIOGRAM COMPLETE
Height: 69 in
Weight: 3272 oz

## 2017-01-17 LAB — CBC
HEMATOCRIT: 39.2 % (ref 39.0–52.0)
Hemoglobin: 12.5 g/dL — ABNORMAL LOW (ref 13.0–17.0)
MCH: 31 pg (ref 26.0–34.0)
MCHC: 31.9 g/dL (ref 30.0–36.0)
MCV: 97.3 fL (ref 78.0–100.0)
Platelets: 266 10*3/uL (ref 150–400)
RBC: 4.03 MIL/uL — ABNORMAL LOW (ref 4.22–5.81)
RDW: 12.7 % (ref 11.5–15.5)
WBC: 5.8 10*3/uL (ref 4.0–10.5)

## 2017-01-17 LAB — TROPONIN I: TROPONIN I: 1.92 ng/mL — AB (ref <0.03)

## 2017-01-17 MED ORDER — CLOPIDOGREL BISULFATE 75 MG PO TABS: 75.0000 mg | ORAL_TABLET | Freq: Every day | ORAL | 6 refills | Status: DC

## 2017-01-17 MED ORDER — ATORVASTATIN CALCIUM 80 MG PO TABS: 80.0000 mg | ORAL_TABLET | Freq: Every day | ORAL | 6 refills | Status: DC

## 2017-01-17 MED ORDER — NITROGLYCERIN 0.4 MG SL SUBL: 0.4000 mg | SUBLINGUAL_TABLET | SUBLINGUAL | 3 refills | Status: DC | PRN

## 2017-01-17 MED ORDER — METOPROLOL SUCCINATE ER 25 MG PO TB24: 25.0000 mg | ORAL_TABLET | Freq: Every day | ORAL | 6 refills | Status: DC

## 2017-01-17 MED ORDER — LISINOPRIL 2.5 MG PO TABS: 2.5000 mg | ORAL_TABLET | Freq: Every day | ORAL | 6 refills | Status: DC

## 2017-01-17 NOTE — Plan of Care (Signed)
  Elimination: Will not experience complications related to bowel motility 01/17/2017 0754 - Completed/Met by Evert Kohl, RN   Coping: Level of anxiety will decrease 01/17/2017 0754 - Completed/Met by Evert Kohl, RN

## 2017-01-17 NOTE — Discharge Instructions (Signed)
**PLEASE REMEMBER TO BRING ALL OF YOUR MEDICATIONS TO EACH OF YOUR FOLLOW-UP OFFICE VISITS.  NO HEAVY LIFTING X 2 WEEKS. NO SEXUAL ACTIVITY X 2 WEEKS. NO DRIVING X 1 WEEK. NO SOAKING BATHS, HOT TUBS, POOLS, ETC., X 7 DAYS.  Radial Site Care Refer to this sheet in the next few weeks. These instructions provide you with information on caring for yourself after your procedure. Your caregiver may also give you more specific instructions. Your treatment has been planned according to current medical practices, but problems sometimes occur. Call your caregiver if you have any problems or questions after your procedure. HOME CARE INSTRUCTIONS  You may shower the day after the procedure.Remove the bandage (dressing) and gently wash the site with plain soap and water.Gently pat the site dry.   Do not apply powder or lotion to the site.   Do not submerge the affected site in water for 3 to 5 days.   Inspect the site at least twice daily.   Do not flex or bend the affected arm for 24 hours.   No lifting over 5 pounds (2.3 kg) for 5 days after your procedure.   Do not drive home if you are discharged the same day of the procedure. Have someone else drive you.   What to expect:  Any bruising will usually fade within 1 to 2 weeks.   Blood that collects in the tissue (hematoma) may be painful to the touch. It should usually decrease in size and tenderness within 1 to 2 weeks.  SEEK IMMEDIATE MEDICAL CARE IF:  You have unusual pain at the radial site.   You have redness, warmth, swelling, or pain at the radial site.   You have drainage (other than a small amount of blood on the dressing).   You have chills.   You have a fever or persistent symptoms for more than 72 hours.   You have a fever and your symptoms suddenly get worse.   Your arm becomes pale, cool, tingly, or numb.   You have heavy bleeding from the site. Hold pressure on the site.      You have received care from [facility] during this hospital stay and we look forward to continuing to provide you with excellent care in our office settings after you've left the hospital.  In order to assure a smoother transition to home following your discharge from the hospital, we will likely have you see one of our nurse practitioners or physician assistants within a few weeks of discharge.  Our advanced practice providers work closely with your physician in order to address all of your heart's needs in a timely manner.  More information about all of our providers may be found here: [email or web address]  Please plan to bring all of your prescriptions to your follow-up appointment and don't hesitate to contact us with questions or concerns.  [place] [phone] - [place] [phone] [facility] - [phone] [facility] [phone] _____________     10 Habits of Highly Healthy [place] wants to help you get well and stay well.  Live a longer, healthier life by practicing healthy habits every day.  1.  Visit your primary care provider regularly. 2.  Make time for family and friends.  Healthy relationships are  important. 3.  Take medications as directed by your provider. 4.  Maintain a healthy weight and a trim waistline. 5.  Eat healthy meals and snacks, rich in fruits, vegetables, whole grains, and lean proteins. 6.  Get moving every day - aim for 150 minutes of moderate physical activity each week. 7.  Don't smoke. 8.  Avoid alcohol or drink in moderation. 9.  Manage stress through meditation or mindful relaxation. 10.  Get seven to nine hours of quality sleep  each night.  Want more information on healthy habits?  To learn more about these and other healthy habits, visit [employer]. _____________

## 2017-01-17 NOTE — Progress Notes (Signed)
Patient ambulating in hallway, independently with family. Pt is asymptomatic, denies complaints.

## 2017-01-17 NOTE — Progress Notes (Signed)
Call placed to [facility] to notify of telemetry monitoring d/c.   

## 2017-01-17 NOTE — Discharge Summary (Signed)
Discharge Summary    Patient ID: Eugene [name],  MRN: [id number], DOB/AGE: [birth year] 62 y.o.  Admit date: 01/15/2017 Discharge date: 01/17/2017  Primary Care Provider: Alycia Rossetti Primary Cardiologist: Carlyle Dolly, MD  Discharge Diagnoses    Principal Problem:   Non-STEMI (non-ST elevated myocardial infarction) [facility])  **Status post catheterization this admission revealing nonobstructive LAD disease  medical therapy recommended.  Active Problems:   CAD (coronary artery disease)   Obesity   ED (erectile dysfunction)   Allergies No Known Allergies  Diagnostic Studies/Procedures    Cardiac Catheterization 1.4.2018  Diagnostic  Dominance: Right  Left Main  Vessel was injected. Vessel is large. Vessel is angiographically normal.  Left Anterior Descending  Prox LAD lesion 25% stenosed  Prox LAD lesion is 25% stenosed. The LAD in some views appears mildly hypodense but no definite filling defect seen.  Ramus Intermedius  Vessel was injected. Vessel is normal in caliber. Vessel is angiographically normal.  Left Circumflex  Vessel was injected. Vessel is normal in caliber. Vessel is angiographically normal.  Right Coronary Artery  Vessel was injected. Vessel is very large. Vessel is angiographically normal.  Intervention   Left Ventricle The left ventricular size is normal. The left ventricular systolic function is normal. LV end diastolic pressure is normal. The left ventricular ejection fraction is 55-65% by visual estimate. No regional wall motion abnormalities.  _____________  2D Echocardiogram 1.5.2018  Prelim read: LVEF is normal 55-60%, no wall motion abnormalities. Final report pending    History of Present Illness     62 year old male with a prior history of erectile dysfunction and back pain.  He has no prior cardiac history.  He was in his usual state of health until the evening prior to admission, when he had an episode of nausea and diaphoresis.   Symptoms eventually resolved but he later awoke during the night with intense substernal chest discomfort unrelieved with Tums.  He presented to the [facility] emergency department where ECG showed lateral T wave inversion and troponin was elevated at 0.30.  He was treated with sl nitroglycerin with significant improvement in symptoms.  He was placed on intravenous heparin and cardiology was consulted.Decision was made to transfer to [facility] for further evaluation.   Hospital Course     Consultants: None  Following arrival to [facility], pt underwent diagnostic cardiac catheterization revealing a 25% lesion in the LAD with mild hypodensity but without a filling defect.  He otherwise had normal coronary arteries and normal LV function.  Recommendation was made for medical therapy and he was maintained on aspirin, Plavix, beta-blocker, ACE inhibitor, and high potency statin therapy.  He did subsequently peaked his troponin at 5.58 and developed T wave inversion in anterior leads.  He was not having any further chest discomfort however, and subsequent troponins have trended downward.  D-dimer was evaluated and was normal.  Echocardiogram on January 5 showed normal LV function without regional wall motion abnormalities.  [name] has been ambulating the halls without recurrent symptoms or limitations.  He will be discharged home today in good condition and we have arranged for follow-up in our [place] clinic next week. _____________  Discharge Vitals Blood pressure 111/70, pulse 65, temperature 97.9 F (36.6 C), temperature source Oral, resp. rate 18, height 5\' 9"  (1.753 m), weight 204 lb 1.6 oz (92.6 kg), SpO2 94 %.  Filed Weights   01/15/17 1451 01/16/17 0458 01/17/17 0614  Weight: 205 lb 3.2 oz (93.1 kg)  204 lb 8 oz (92.8 kg) 204 lb 1.6 oz (92.6 kg)    Labs & Radiologic Studies    CBC Recent Labs    01/16/17 0935 01/17/17 0502  WBC 5.5 5.8  HGB 12.8* 12.5*  HCT 39.1 39.2    MCV 96.8 97.3  PLT 275 938   Basic Metabolic Panel Recent Labs    01/16/17 0256 01/17/17 0502  NA 136 133*  K 4.1 4.2  CL 103 102  CO2 25 21*  GLUCOSE 105* 101*  BUN 15 19  CREATININE 0.97 1.02  CALCIUM 8.9 9.0   Cardiac Enzymes Recent Labs    01/16/17 0256 01/16/17 0935 01/17/17 0502  TROPONINI 5.58* 3.48* 1.92*   D-Dimer Recent Labs    01/16/17 0935  DDIMER 0.28   Hemoglobin A1C Recent Labs    01/16/17 0256  HGBA1C 4.9   Fasting Lipid Panel Recent Labs    01/16/17 0256  CHOL 146  HDL 42  LDLCALC 91  TRIG 64  CHOLHDL 3.5  _____________  Dg Chest 2 View  Result Date: 01/15/2017 CLINICAL DATA:  Chest pain, onset last night. EXAM: CHEST  2 VIEW COMPARISON:  None. FINDINGS: Low lung volumes. The cardiomediastinal contours are normal. Heart size upper normal. Mild bibasilar atelectasis, right greater than left. Pulmonary vasculature is normal. No consolidation, pleural effusion, or pneumothorax. No acute osseous abnormalities are seen. IMPRESSION: Low lung volumes with bibasilar atelectasis. Electronically Signed   By: Jeb Levering M.D.   On: 01/15/2017 06:33   Disposition   Pt is being discharged home today in good condition.  Follow-up Plans & Appointments    Follow-up Information    Erma Heritage, PA-C Follow up on 01/29/2017.   Specialties:  Librarian, academic, Cardiology Why:  2:30 PM - Dr. Nelly Laurence PA Contact information: [place] [phone]           Discharge Medications   Allergies as of 01/17/2017   No Known Allergies     Medication List    STOP taking these medications   sildenafil 20 MG tablet Commonly known as:  REVATIO     TAKE these medications   aspirin 81 MG tablet Take 81 mg by mouth daily.   atorvastatin 80 MG tablet Commonly known as:  LIPITOR Take 1 tablet (80 mg total) by mouth daily at 6 PM.   clopidogrel 75 MG tablet Commonly known as:  PLAVIX Take 1 tablet (75 mg  total) by mouth daily.   cyclobenzaprine 10 MG tablet Commonly known as:  FLEXERIL Take 1 tablet (10 mg total) by mouth 2 (two) times daily as needed for muscle spasms.   fluticasone 50 MCG/ACT nasal spray Commonly known as:  FLONASE Place 2 sprays into both nostrils daily.   lisinopril 2.5 MG tablet Commonly known as:  PRINIVIL,ZESTRIL Take 1 tablet (2.5 mg total) by mouth daily.   metoprolol succinate 25 MG 24 hr tablet Commonly known as:  TOPROL-XL Take 1 tablet (25 mg total) by mouth daily.   multivitamin tablet Take 1 tablet by mouth daily.   nitroGLYCERIN 0.4 MG SL tablet Commonly known as:  NITROSTAT Place 1 tablet (0.4 mg total) under the tongue every 5 (five) minutes x 3 doses as needed for chest pain.       Aspirin prescribed at discharge?  Yes High Intensity Statin Prescribed? (Lipitor 40-80mg  or Crestor 20-40mg ): Yes Beta Blocker Prescribed? Yes For EF <40%, was ACEI/ARB Prescribed? Yes ADP Receptor Inhibitor Prescribed? (i.e. Plavix  etc.-Includes Medically Managed Patients): Yes For EF <40%, Aldosterone Inhibitor Prescribed? No: N/A Was EF assessed during THIS hospitalization? Yes Was Cardiac Rehab II ordered? (Included Medically managed Patients): Yes   Outstanding Labs/Studies   F/u BMET @ f/u appt (new ACEI) F/u Lipids/LFT's in 6 wks (new Statin)  Duration of Discharge Encounter   Greater than 30 minutes including physician time.  Signed, Murray Hodgkins NP 01/17/2017, 10:53 AM

## 2017-01-17 NOTE — Progress Notes (Signed)
Progress Note  Patient Name: Eugene [name] Date of Encounter: 01/17/2017  Primary Cardiologist: No primary care provider on file.   Subjective   No complaints  Inpatient Medications    Scheduled Meds: . aspirin EC  81 mg Oral Daily  . atorvastatin  80 mg Oral q1800  . clopidogrel  75 mg Oral Daily  . heparin  5,000 Units Subcutaneous Q8H  . lisinopril  2.5 mg Oral Daily  . metoprolol succinate  25 mg Oral Daily  . sodium chloride flush  3 mL Intravenous Q12H   Continuous Infusions: . sodium chloride     PRN Meds: sodium chloride, acetaminophen, nitroGLYCERIN, ondansetron (ZOFRAN) IV, sodium chloride flush   Vital Signs    Vitals:   01/16/17 1107 01/16/17 1600 01/16/17 2033 01/17/17 0614  BP: 111/71 105/61 97/61 111/70  Pulse: 62 75 65 65  Resp: 20 18  18   Temp: 98.4 F (36.9 C) 98.2 F (36.8 C) 98 F (36.7 C) 97.9 F (36.6 C)  TempSrc: Oral Oral Oral Oral  SpO2: 98% 98% 97% 94%  Weight:    204 lb 1.6 oz (92.6 kg)  Height:        Intake/Output Summary (Last 24 hours) at 01/17/2017 0907 Last data filed at 01/17/2017 4403 Gross per 24 hour  Intake 360 ml  Output 900 ml  Net -540 ml   Filed Weights   01/15/17 1451 01/16/17 0458 01/17/17 0614  Weight: 205 lb 3.2 oz (93.1 kg) 204 lb 8 oz (92.8 kg) 204 lb 1.6 oz (92.6 kg)    Telemetry    SR, rare PVCs - Personally Reviewed  ECG    SR, anterior/lateral TWIs  Physical Exam   GEN: No acute distress.   Neck: No JVD Cardiac: RRR, no murmurs, rubs, or gallops.  Respiratory: Clear to auscultation bilaterally. GI: Soft, nontender, non-distended  MS: No edema; No deformity. Neuro:  Nonfocal  Psych: Normal affect   Labs    Chemistry Recent Labs  Lab 01/15/17 0541 01/16/17 0256 01/17/17 0502  NA 135 136 133*  K 3.6 4.1 4.2  CL 101 103 102  CO2 23 25 21*  GLUCOSE 128* 105* 101*  BUN 19 15 19   CREATININE 0.91 0.97 1.02  CALCIUM 9.5 8.9 9.0  GFRNONAA >60 >60 >60  GFRAA >60 >60 >60  ANIONGAP 11 8  10      Hematology Recent Labs  Lab 01/15/17 1524 01/16/17 0935 01/17/17 0502  WBC 8.2 5.5 5.8  RBC 4.18* 4.04* 4.03*  HGB 13.2 12.8* 12.5*  HCT 39.9 39.1 39.2  MCV 95.5 96.8 97.3  MCH 31.6 31.7 31.0  MCHC 33.1 32.7 31.9  RDW 12.6 12.7 12.7  PLT 276 275 266    Cardiac Enzymes Recent Labs  Lab 01/16/17 0256 01/16/17 0935 01/17/17 0502  TROPONINI 5.58* 3.48* 1.92*    Recent Labs  Lab 01/15/17 0554  TROPIPOC 0.30*     BNPNo results for input(s): BNP, PROBNP in the last 168 hours.   DDimer  Recent Labs  Lab 01/16/17 0935  DDIMER 0.28     Radiology    No results found.  Cardiac Studies     Patient Profile     62 y.o. male  Admitted with NSTEMI.   Assessment & Plan    1. Chest pain/NSTEMI - trop up to 5.58 and trending down Cath yesterday without significant CAD, though some question about a hypodensity in the proximal LAD could represent plaque erosion vs thrombus, does not appear obstructive. See  Dr Morrison Old note from  yesterday. Postcath patient did develop more prominent anterior/anterolateral TWIs. Trop trends down, echo is pending.  - medical therapy for NSTEMI with ASA 81, atorva 80, plavix 75, Toprol XL. Start lisinopril 2.5 mg daily.  - echo official report pending today. I reviewed the images and LVEF is normal 55-60%, no wall motion abnormalities. Final report pending  - patient's EKG remains abnormal. Troponin trending down, echo with normal LVEF and no WMAs and he remains asymptomatic. Perhaps he had plaque erosion with transient thrombus that was not present at time of cath and thus no lesion to intervene on.   - we will have patient ambulate today, if does well plan for discharge today with outpatient follow up in [place] in 1-2 weeks. Will need a note for work clearing him from duty until his cardiology f/u.     For questions or updates, please contact [facility] [place] Please consult [email or web address] for contact info under  Cardiology/STEMI.      Merrily Pew, MD  01/17/2017, 9:07 AM

## 2017-01-17 NOTE — Progress Notes (Signed)
Pt refused wheelchair at discharge. Ambulated out independently accompanied by daughter.

## 2017-01-18 ENCOUNTER — Encounter (HOSPITAL_COMMUNITY): Payer: Self-pay | Admitting: Cardiology

## 2017-01-19 ENCOUNTER — Telehealth: Payer: Self-pay | Admitting: *Deleted

## 2017-01-19 NOTE — Telephone Encounter (Signed)
Patient contacted regarding discharge from [facility] on 01/17/17.  Patient understands to follow up with provider Bernerd Pho PA-C on 01/29/17 at 2:30 pm at [facility]. Patient understands discharge instructions? Yes  Patient understands medications and regiment? Yes  Patient understands to bring all medications to this visit? Yes

## 2017-01-26 ENCOUNTER — Encounter: Payer: Self-pay | Admitting: Family Medicine

## 2017-01-27 ENCOUNTER — Other Ambulatory Visit: Payer: 59

## 2017-01-27 DIAGNOSIS — Z0000 Encounter for general adult medical examination without abnormal findings: Secondary | ICD-10-CM | POA: Diagnosis not present

## 2017-01-27 DIAGNOSIS — Z125 Encounter for screening for malignant neoplasm of prostate: Secondary | ICD-10-CM | POA: Diagnosis not present

## 2017-01-28 LAB — COMPREHENSIVE METABOLIC PANEL
AG RATIO: 1 (calc) (ref 1.0–2.5)
ALBUMIN MSPROF: 4.2 g/dL (ref 3.6–5.1)
ALT: 17 U/L (ref 9–46)
AST: 14 U/L (ref 10–35)
Alkaline phosphatase (APISO): 76 U/L (ref 40–115)
BUN: 15 mg/dL (ref 7–25)
CHLORIDE: 100 mmol/L (ref 98–110)
CO2: 27 mmol/L (ref 20–32)
Calcium: 9.7 mg/dL (ref 8.6–10.3)
Creat: 1.17 mg/dL (ref 0.70–1.33)
GLOBULIN: 4.3 g/dL — AB (ref 1.9–3.7)
Glucose, Bld: 99 mg/dL (ref 65–99)
Potassium: 4.6 mmol/L (ref 3.5–5.3)
SODIUM: 136 mmol/L (ref 135–146)
TOTAL PROTEIN: 8.5 g/dL — AB (ref 6.1–8.1)
Total Bilirubin: 0.6 mg/dL (ref 0.2–1.2)

## 2017-01-28 LAB — TSH: TSH: 4.45 m[IU]/L (ref 0.40–4.50)

## 2017-01-28 LAB — PSA: PSA: 0.4 ng/mL (ref <=4.0)

## 2017-01-28 LAB — EXTRA LAV TOP TUBE

## 2017-01-28 NOTE — Progress Notes (Signed)
Cardiology Office Note    Date:  01/29/2017   ID:  Eugene [name], DOB 1955-11-12, MRN [id number]  PCP:  Alycia Rossetti, MD  Cardiologist: Dr. Harl Bowie   Chief Complaint  Patient presents with  . Hospitalization Follow-up    History of Present Illness:    Eugene [name] is a 62 y.o. male with past medical history of back pain and ED who presents to the office today for hospital follow-up.   He was recently admitted from 1/4 - 01/17/2017 for evaluation of chest pain and found to have an NSTEMI. Reported developing chest pain, nausea, and diaphoresis which started the evening prior to his arrival and was unrelieved with TUMS. Cyclic troponin values were found to be elevated (peaked at 5.58) and he was transferred from [facility] to [facility] for a cardiac catheterization. This was performed on 01/16/2017 and showed 25% proximal-LAD stenosis with initial views concerning for hypodensity in the proximal LAD but there was poor filling due to poor catheter engagement and no filling defect noted with a guide catheter and did not appear obstructive. He was started on medical therapy with ASA and Plavix along with BB and statin therapy.   In talking with the patient today, he reports doing well since his recent hospitalization. He denies any repeat episodes of chest discomfort or dyspnea on exertion. Denies any recent orthopnea, PND, lower extremity edema, palpitations, lightheadedness, dizziness, or presyncope.  He did develop back discomfort upon starting Atorvastatin and self discontinued this approximately 1 week ago. The pain would start within a few hours of taking the medication and then resolve. He was not on statin therapy prior to his recent hospitalization.  He reports good compliance with his other medications including Aspirin and Plavix. Denies any evidence of active bleeding.  He is anxious to return to work as he has been "bored" at home.   Past Medical History:  Diagnosis Date  .  Back pain   . CAD (coronary artery disease)    a. 01/2017 NSTEMI/Cath: LM nl, LAD 25p - ? hypodense but no filling defect, RI nl, LCX nl, RCA nl, EF 55-65%-->Med Rx.  . Erectile dysfunction   . History of echocardiogram    a. 01/2017 Echo: EF 55-60%, no rwma.    Past Surgical History:  Procedure Laterality Date  . LEFT HEART CATH AND CORONARY ANGIOGRAPHY N/A 01/15/2017   Procedure: LEFT HEART CATH AND CORONARY ANGIOGRAPHY;  Surgeon: Martinique, Peter M, MD;  Location: [facility];  Service: Cardiovascular;  Laterality: N/A;    Current Medications: Outpatient Medications Prior to Visit  Medication Sig Dispense Refill  . aspirin 81 MG tablet Take 81 mg by mouth daily.      Marland Kitchen atorvastatin (LIPITOR) 80 MG tablet Take 1 tablet (80 mg total) by mouth daily at 6 PM. 30 tablet 6  . clopidogrel (PLAVIX) 75 MG tablet Take 1 tablet (75 mg total) by mouth daily. 30 tablet 6  . cyclobenzaprine (FLEXERIL) 10 MG tablet Take 1 tablet (10 mg total) by mouth 2 (two) times daily as needed for muscle spasms. 20 tablet 0  . fluticasone (FLONASE) 50 MCG/ACT nasal spray Place 2 sprays into both nostrils daily. 16 g 1  . lisinopril (PRINIVIL,ZESTRIL) 2.5 MG tablet Take 1 tablet (2.5 mg total) by mouth daily. 30 tablet 6  . metoprolol succinate (TOPROL-XL) 25 MG 24 hr tablet Take 1 tablet (25 mg total) by mouth daily. 30 tablet 6  . Multiple Vitamin (MULTIVITAMIN) tablet Take  1 tablet by mouth daily.      . nitroGLYCERIN (NITROSTAT) 0.4 MG SL tablet Place 1 tablet (0.4 mg total) under the tongue every 5 (five) minutes x 3 doses as needed for chest pain. 25 tablet 3   No facility-administered medications prior to visit.      Allergies:   Patient has no known allergies.   Social History   Socioeconomic History  . Marital status: Divorced    Spouse name: None  . Number of children: None  . Years of education: None  . Highest education level: None  Social Needs  . Financial resource strain: None  . Food  insecurity - worry: None  . Food insecurity - inability: None  . Transportation needs - medical: None  . Transportation needs - non-medical: None  Occupational History  . None  Tobacco Use  . Smoking status: Never Smoker  . Smokeless tobacco: Never Used  Substance and Sexual Activity  . Alcohol use: No  . Drug use: No  . Sexual activity: Yes  Other Topics Concern  . None  Social History Narrative  . None     Family History:  The patient's family history includes Cancer in his brother and mother; Diabetes in his mother and sister; Hypertension in his sister; Mental illness in his sister.   Review of Systems:   Please see the history of present illness.     General:  No chills, fever, night sweats or weight changes. Positive for myalgias (now improved). Cardiovascular:  No chest pain, dyspnea on exertion, edema, orthopnea, palpitations, paroxysmal nocturnal dyspnea. Dermatological: No rash, lesions/masses Respiratory: No cough, dyspnea Urologic: No hematuria, dysuria Abdominal:   No nausea, vomiting, diarrhea, bright red blood per rectum, melena, or hematemesis Neurologic:  No visual changes, wkns, changes in mental status. All other systems reviewed and are otherwise negative except as noted above.   Physical Exam:    VS:  BP 128/64 (BP Location: Left Arm)   Pulse 67   Ht 5\' 9"  (1.753 m)   Wt 213 lb (96.6 kg)   SpO2 97%   BMI 31.45 kg/m    General: Well developed, well nourished [place] American male appearing in no acute distress. Head: Normocephalic, atraumatic, sclera non-icteric, no xanthomas, nares are without discharge.  Neck: No carotid bruits. JVD not elevated.  Lungs: Respirations regular and unlabored, without wheezes or rales.  Heart: Regular rate and rhythm. No S3 or S4.  No murmur, no rubs, or gallops appreciated. Abdomen: Soft, non-tender, non-distended with normoactive bowel sounds. No hepatomegaly. No rebound/guarding. No obvious abdominal masses. Msk:   Strength and tone appear normal for age. No joint deformities or effusions. Extremities: No clubbing or cyanosis. No lower extremity edema.  Distal pedal pulses are 2+ bilaterally. Radial cath site stable without ecchymosis or evidence of a hematoma.  Neuro: Alert and oriented X 3. Moves all extremities spontaneously. No focal deficits noted. Psych:  Responds to questions appropriately with a normal affect. Skin: No rashes or lesions noted  Wt Readings from Last 3 Encounters:  01/29/17 213 lb (96.6 kg)  01/17/17 204 lb 1.6 oz (92.6 kg)  10/26/16 212 lb (96.2 kg)     Studies/Labs Reviewed:   EKG:  EKG is not ordered today.  EKG from 01/17/2017 is reviewed which shows NSR, HR 69, with lateral TWI.   Recent Labs: 01/17/2017: Hemoglobin 12.5; Platelets 266 01/27/2017: ALT 17; TSH 4.45 01/29/2017: BUN 20; Creatinine, Ser 1.05; Potassium 3.7; Sodium 140   Lipid Panel  Component Value Date/Time   CHOL 146 01/16/2017 0256   TRIG 64 01/16/2017 0256   HDL 42 01/16/2017 0256   CHOLHDL 3.5 01/16/2017 0256   VLDL 13 01/16/2017 0256   LDLCALC 91 01/16/2017 0256    Additional studies/ records that were reviewed today include:   Cardiac Catheterization: 01/15/2017  Prox LAD lesion is 25% stenosed.  The left ventricular systolic function is normal.  LV end diastolic pressure is normal.  The left ventricular ejection fraction is 55-65% by visual estimate.   1. No significant obstructive CAD. Initial views were concerning for hypodensity in the proximal LAD but there was poor filling due to poor catheter engagement. When a guide catheter was used with better filling this was less with no filling defect noted. 2. Normal LV function 3. Normal LVEDP  Plan: medical therapy. Cycle cardiac enzymes and Ecg. Check Echo.    Echocardiogram: 01/16/2017 Study Conclusions  - Left ventricle: The cavity size was normal. Wall thickness was   normal. Systolic function was normal. The estimated  ejection   fraction was in the range of 55% to 60%. Wall motion was normal;   there were no regional wall motion abnormalities. Left   ventricular diastolic function parameters were normal. - Left atrium: The atrium was mildly dilated. - Pulmonary arteries: PA peak pressure: 33 mm Hg (S).  Assessment:    1. Non-ST elevation myocardial infarction (NSTEMI), subsequent episode of care ([place])   2. Coronary artery disease involving native coronary artery of native heart without angina pectoris   3. Medication management   4. Essential hypertension   5. Hyperlipidemia LDL goal <70      Plan:   In order of problems listed above:  1. Subsequent Episode of Care for NSTEMI/ CAD - he was recently admitted for chest pain and found to have an NSTEMI with peak troponin of 5.58). Cath showed a hypodensity in the proximal LAD which could represent plaque erosion or thrombus but did not appear obstructive. Medical therapy was recommended.  - he denies any repeat episodes of chest discomfort or dyspnea on exertion. Radial cath site is stable. He may return to work (papers completed following his visit today).  - will continue on medical therapy with ASA and Plavix for 12 months. Continue BB and statin therapy.   2. HTN - BP is well controlled at 128/64 during today's visit. - continue Lisinopril 2.5 mg daily and Toprol-XL 25 mg daily. Recheck BMET today following initiation of ACE-I.   3. HLD - Lipid panel during recent admission showed total cholesterol 146, HDL 42, and LDL 91. Goal LDL is less than 70 with known CAD. - He developed myalgias with Atorvastatin 80 mg daily. I recommended he try cutting this in half and taking 40 mg daily to see if his symptoms improve. If still having myalgias, consider Pravastatin or low-dose Crestor 5mg  daily.    Medication Adjustments/Labs and Tests Ordered: Current medicines are reviewed at length with the patient today.  Concerns regarding medicines are outlined  above.  Medication changes, Labs and Tests ordered today are listed in the Patient Instructions below. Patient Instructions  Medication Instructions:  Your physician recommends that you continue on your current medications as directed. Please refer to the Current Medication list given to you today.  Labwork: TODAY BMET  Testing/Procedures: NONE  Follow-Up: Your physician recommends that you schedule a follow-up appointment in: 3 MONTHS   Any Other Special Instructions Will Be Listed Below (If Applicable).  If you  need a refill on your cardiac medications before your next appointment, please call your pharmacy.    Signed, Erma Heritage, PA-C  01/29/2017 4:54 PM    [facility] [address] [place], [place] [address] Phone: [phone]

## 2017-01-29 ENCOUNTER — Ambulatory Visit (INDEPENDENT_AMBULATORY_CARE_PROVIDER_SITE_OTHER): Payer: 59 | Admitting: Student

## 2017-01-29 ENCOUNTER — Other Ambulatory Visit (HOSPITAL_COMMUNITY)
Admission: RE | Admit: 2017-01-29 | Discharge: 2017-01-29 | Disposition: A | Discharge status: Home / Self Care | Payer: 59 | Source: Ambulatory Visit | Attending: Student | Admitting: Student

## 2017-01-29 ENCOUNTER — Encounter: Payer: Self-pay | Admitting: Student

## 2017-01-29 VITALS — BP 128/64 | HR 67 | Ht 69.0 in | Wt 213.0 lb

## 2017-01-29 DIAGNOSIS — I10 Essential (primary) hypertension: Secondary | ICD-10-CM | POA: Diagnosis not present

## 2017-01-29 DIAGNOSIS — I2510 Atherosclerotic heart disease of native coronary artery without angina pectoris: Secondary | ICD-10-CM

## 2017-01-29 DIAGNOSIS — E785 Hyperlipidemia, unspecified: Secondary | ICD-10-CM

## 2017-01-29 DIAGNOSIS — I214 Non-ST elevation (NSTEMI) myocardial infarction: Secondary | ICD-10-CM

## 2017-01-29 DIAGNOSIS — Z79899 Other long term (current) drug therapy: Secondary | ICD-10-CM | POA: Diagnosis not present

## 2017-01-29 LAB — BASIC METABOLIC PANEL
ANION GAP: 10 (ref 5–15)
BUN: 20 mg/dL (ref 6–20)
CHLORIDE: 104 mmol/L (ref 101–111)
CO2: 26 mmol/L (ref 22–32)
Calcium: 9.4 mg/dL (ref 8.9–10.3)
Creatinine, Ser: 1.05 mg/dL (ref 0.61–1.24)
GFR calc non Af Amer: >60 mL/min (ref >60)
GLUCOSE: 133 mg/dL — AB (ref 65–99)
Potassium: 3.7 mmol/L (ref 3.5–5.1)
Sodium: 140 mmol/L (ref 135–145)

## 2017-01-29 NOTE — Patient Instructions (Signed)
Medication Instructions:  Your physician recommends that you continue on your current medications as directed. Please refer to the Current Medication list given to you today.   Labwork: TODAY BMET  Testing/Procedures: NONE  Follow-Up: Your physician recommends that you schedule a follow-up appointment in: 3 MONTHS    Any Other Special Instructions Will Be Listed Below (If Applicable).     If you need a refill on your cardiac medications before your next appointment, please call your pharmacy.

## 2017-02-01 ENCOUNTER — Other Ambulatory Visit: Payer: Self-pay

## 2017-02-01 ENCOUNTER — Ambulatory Visit (INDEPENDENT_AMBULATORY_CARE_PROVIDER_SITE_OTHER): Payer: 59 | Admitting: Family Medicine

## 2017-02-01 ENCOUNTER — Encounter: Payer: Self-pay | Admitting: Family Medicine

## 2017-02-01 VITALS — BP 130/68 | HR 84 | Temp 98.5°F | Resp 16 | Ht 69.0 in | Wt 214.0 lb

## 2017-02-01 DIAGNOSIS — Z125 Encounter for screening for malignant neoplasm of prostate: Secondary | ICD-10-CM

## 2017-02-01 DIAGNOSIS — Z6831 Body mass index (BMI) 31.0-31.9, adult: Secondary | ICD-10-CM

## 2017-02-01 DIAGNOSIS — E6609 Other obesity due to excess calories: Secondary | ICD-10-CM | POA: Diagnosis not present

## 2017-02-01 DIAGNOSIS — I2510 Atherosclerotic heart disease of native coronary artery without angina pectoris: Secondary | ICD-10-CM | POA: Diagnosis not present

## 2017-02-01 DIAGNOSIS — Z0000 Encounter for general adult medical examination without abnormal findings: Secondary | ICD-10-CM

## 2017-02-01 NOTE — Assessment & Plan Note (Signed)
Decrease lipitor to 40mg , add coenzyme Q 10 Continue other medications  Discussed dietary changes, exercise

## 2017-02-01 NOTE — Progress Notes (Signed)
   Subjective:    Patient ID: Eugene [name], male    DOB: December 27, 1955, 62 y.o.   MRN: [id number]  Patient presents for Hospital F/U (MI) and CPE (is not fasting)   Pt here for CPE, however he was admitted with chest pain found to have CAD /NSTEMI. Non obstructive CAD so stent was not placed, medical therapy initiated . He is on Plaix and ASA for 1 year   Started on lisinipril and Metopolol  Medications reviewed Fasting labs and PSA reviewed He gets some muscle aches with his Lipitor.  It was recommended that he try decreasing the dose by his cardiologist last Friday. He is starting down to exercise again and has been changing his diet. Up-to-date Due for colonoscopy in 2009 he wants to defer this since he does have a heart attack.  He has not had any change in his bowels.  Review Of Systems:  GEN- denies fatigue, fever, weight loss,weakness, recent illness HEENT- denies eye drainage, change in vision, nasal discharge, CVS- denies chest pain, palpitations RESP- denies SOB, cough, wheeze ABD- denies N/V, change in stools, abd pain GU- denies dysuria, hematuria, dribbling, incontinence MSK- denies joint pain, muscle aches, injury Neuro- denies headache, dizziness, syncope, seizure activity       Objective:    BP 130/68   Pulse 84   Temp 98.5 F (36.9 C) (Oral)   Resp 16   Ht 5\' 9"  (1.753 m)   Wt 214 lb (97.1 kg)   SpO2 98%   BMI 31.60 kg/m  GEN- NAD, alert and oriented x3 HEENT- PERRL, EOMI, non injected sclera, pink conjunctiva, MMM, oropharynx clear, TM clear bilat no effusion Neck- Supple, no thyromegaly CVS- RRR, no murmur RESP-CTAB ABD-NABS,soft,NT,ND EXT- No edema Pulses- Radial, DP- 2+        Assessment & Plan:      Problem List Items Addressed This Visit      Unprioritized   Routine general medical examination at a health care facility - Primary    CPE done, reviewed labs at bedside Continue current medications Will defer colonoscopy until April 2020,  he will be off plavix unless any changes to warrant earlier screening       Prostate cancer screening    PSA neg      Obesity    Goal weight loss of 10lbs      CAD (coronary artery disease)    Decrease lipitor to 40mg , add coenzyme Q 10 Continue other medications  Discussed dietary changes, exercise          Note: This dictation was prepared with Dragon dictation along with smaller phrase technology. Any transcriptional errors that result from this process are unintentional.

## 2017-02-01 NOTE — Assessment & Plan Note (Signed)
CPE done, reviewed labs at bedside Continue current medications Will defer colonoscopy until April 2020, he will be off plavix unless any changes to warrant earlier screening

## 2017-02-01 NOTE — Patient Instructions (Addendum)
Co-enzyme Q 10 , take with 1/2  Of the lipitor at bedtime  Continue all other medications F/U 6 months

## 2017-02-01 NOTE — Assessment & Plan Note (Signed)
PSA neg

## 2017-02-01 NOTE — Assessment & Plan Note (Signed)
Goal weight loss of 10lbs

## 2017-02-15 MED FILL — LISINOPRIL 2.5 MG TABLET: 2.5 | 90 days supply | Qty: 90 | Fill #0

## 2017-02-15 MED FILL — CLOPIDOGREL 75 MG TABLET: 75 | 90 days supply | Qty: 90 | Fill #0

## 2017-02-15 MED FILL — METOPROLOL SUCCINATE ER 25: 25 | 90 days supply | Qty: 90 | Fill #0

## 2017-03-09 ENCOUNTER — Encounter: Payer: Self-pay | Admitting: Gastroenterology

## 2017-05-05 ENCOUNTER — Encounter: Payer: Self-pay | Admitting: Cardiology

## 2017-05-05 ENCOUNTER — Ambulatory Visit (INDEPENDENT_AMBULATORY_CARE_PROVIDER_SITE_OTHER): Payer: 59 | Admitting: Cardiology

## 2017-05-05 VITALS — BP 106/88 | HR 67 | Ht 69.0 in | Wt 204.8 lb

## 2017-05-05 DIAGNOSIS — I10 Essential (primary) hypertension: Secondary | ICD-10-CM

## 2017-05-05 DIAGNOSIS — E782 Mixed hyperlipidemia: Secondary | ICD-10-CM

## 2017-05-05 DIAGNOSIS — I2510 Atherosclerotic heart disease of native coronary artery without angina pectoris: Secondary | ICD-10-CM | POA: Diagnosis not present

## 2017-05-05 NOTE — Progress Notes (Signed)
Clinical Summary Mr. Schreifels is a 62 y.o.male seen today for follow up of the following medical problems.   1. CAD - admit Jan 2019 with NSTEMI, peak trop 5.58 - cath without significant disease, questionable LAD plaque erosion.  - jan 2019 echo LVEF 55-60%  - no recent chest pain, no SOB/DOE - compliant with meds   2. HTN - compliant with meds   3. Hyperlipidemia - compliant with statin  Past Medical History:  Diagnosis Date  . Back pain   . CAD (coronary artery disease)    a. 01/2017 NSTEMI/Cath: LM nl, LAD 25p - ? hypodense but no filling defect, RI nl, LCX nl, RCA nl, EF 55-65%-->Med Rx.  . Erectile dysfunction   . History of echocardiogram    a. 01/2017 Echo: EF 55-60%, no rwma.     No Known Allergies   Current Outpatient Medications  Medication Sig Dispense Refill  . aspirin 81 MG tablet Take 81 mg by mouth daily.      Marland Kitchen atorvastatin (LIPITOR) 80 MG tablet Take 1 tablet (80 mg total) by mouth daily at 6 PM. (Patient not taking: Reported on 02/01/2017) 30 tablet 6  . clopidogrel (PLAVIX) 75 MG tablet Take 1 tablet (75 mg total) by mouth daily. 30 tablet 6  . cyclobenzaprine (FLEXERIL) 10 MG tablet Take 1 tablet (10 mg total) by mouth 2 (two) times daily as needed for muscle spasms. 20 tablet 0  . fluticasone (FLONASE) 50 MCG/ACT nasal spray Place 2 sprays into both nostrils daily. 16 g 1  . lisinopril (PRINIVIL,ZESTRIL) 2.5 MG tablet Take 1 tablet (2.5 mg total) by mouth daily. 30 tablet 6  . metoprolol succinate (TOPROL-XL) 25 MG 24 hr tablet Take 1 tablet (25 mg total) by mouth daily. 30 tablet 6  . Multiple Vitamin (MULTIVITAMIN) tablet Take 1 tablet by mouth daily.      . nitroGLYCERIN (NITROSTAT) 0.4 MG SL tablet Place 1 tablet (0.4 mg total) under the tongue every 5 (five) minutes x 3 doses as needed for chest pain. 25 tablet 3   No current facility-administered medications for this visit.      Past Surgical History:  Procedure Laterality Date  .  LEFT HEART CATH AND CORONARY ANGIOGRAPHY N/A 01/15/2017   Procedure: LEFT HEART CATH AND CORONARY ANGIOGRAPHY;  Surgeon: Martinique, Peter M, MD;  Location: [facility];  Service: Cardiovascular;  Laterality: N/A;     No Known Allergies    Family History  Problem Relation Age of Onset  . Cancer Mother        breast  . Diabetes Mother   . Diabetes Sister   . Hypertension Sister   . Cancer Brother   . Mental illness Sister      Social History Mr. Ferrara reports that he has never smoked. He has never used smokeless tobacco. Mr. Dusza reports that he does not drink alcohol.   Review of Systems CONSTITUTIONAL: No weight loss, fever, chills, weakness or fatigue.  HEENT: Eyes: No visual loss, blurred vision, double vision or yellow sclerae.No hearing loss, sneezing, congestion, runny nose or sore throat.  SKIN: No rash or itching.  CARDIOVASCULAR: per hpi RESPIRATORY: No shortness of breath, cough or sputum.  GASTROINTESTINAL: No anorexia, nausea, vomiting or diarrhea. No abdominal pain or blood.  GENITOURINARY: No burning on urination, no polyuria NEUROLOGICAL: No headache, dizziness, syncope, paralysis, ataxia, numbness or tingling in the extremities. No change in bowel or bladder control.  MUSCULOSKELETAL: No muscle, back  pain, joint pain or stiffness.  LYMPHATICS: No enlarged nodes. No history of splenectomy.  PSYCHIATRIC: No history of depression or anxiety.  ENDOCRINOLOGIC: No reports of sweating, cold or heat intolerance. No polyuria or polydipsia.  Marland Kitchen   Physical Examination Vitals:   05/05/17 0851  BP: 106/88  Pulse: 67  SpO2: 97%   Vitals:   05/05/17 0851  Weight: 204 lb 12.8 oz (92.9 kg)  Height: 5\' 9"  (1.753 m)    Gen: resting comfortably, no acute distress HEENT: no scleral icterus, pupils equal round and reactive, no palptable cervical adenopathy,  CV: RRR, no m/r/g, no jvd Resp: Clear to auscultation bilaterally GI: abdomen is soft, non-tender,  non-distended, normal bowel sounds, no hepatosplenomegaly MSK: extremities are warm, no edema.  Skin: warm, no rash Neuro:  no focal deficits Psych: appropriate affect   Diagnostic Studies  Jan 2019 echo Study Conclusions  - Left ventricle: The cavity size was normal. Wall thickness was   normal. Systolic function was normal. The estimated ejection   fraction was in the range of 55% to 60%. Wall motion was normal;   there were no regional wall motion abnormalities. Left   ventricular diastolic function parameters were normal. - Left atrium: The atrium was mildly dilated. - Pulmonary arteries: PA peak pressure: 33 mm Hg (S).  Jan 2019 cath  Prox LAD lesion is 25% stenosed.  The left ventricular systolic function is normal.  LV end diastolic pressure is normal.  The left ventricular ejection fraction is 55-65% by visual estimate.   1. No significant obstructive CAD. Initial views were concerning for hypodensity in the proximal LAD but there was poor filling due to poor catheter engagement. When a guide catheter was used with better filling this was less with no filling defect noted. 2. Normal LV function 3. Normal LVEDP  Plan: medical therapy. Cycle cardiac enzymes and Ecg. Check Echo.    Assessment and Plan   1. CAD - no recent symptoms. Continue DAPT until Jan 2010 - continue current meds  2. HTN - at goal, continue current meds  3. Hyperlipidemia - continue high dose statin in setting of recent NSTEMI - repeat lipids later this year.      Arnoldo Lenis, M.D.

## 2017-05-05 NOTE — Patient Instructions (Signed)
Medication Instructions:  Your physician recommends that you continue on your current medications as directed. Please refer to the Current Medication list given to you today.   Labwork: NONE  Testing/Procedures: NONE  Follow-Up: Your physician recommends that you schedule a follow-up appointment in: 6 Months with Dr. Branch    Any Other Special Instructions Will Be Listed Below (If Applicable).     If you need a refill on your cardiac medications before your next appointment, please call your pharmacy.  Thank you for choosing [facility]!   

## 2017-05-17 MED FILL — LISINOPRIL 2.5 MG TABLET: 2.5 | 90 days supply | Qty: 90 | Fill #1

## 2017-05-17 MED FILL — CLOPIDOGREL 75 MG TABLET: 75 | 90 days supply | Qty: 90 | Fill #1

## 2017-05-17 MED FILL — METOPROLOL SUCCINATE ER 25: 25 | 90 days supply | Qty: 90 | Fill #1

## 2017-08-02 ENCOUNTER — Other Ambulatory Visit: Payer: Self-pay

## 2017-08-02 ENCOUNTER — Ambulatory Visit (INDEPENDENT_AMBULATORY_CARE_PROVIDER_SITE_OTHER): Payer: 59 | Admitting: Family Medicine

## 2017-08-02 ENCOUNTER — Encounter: Payer: Self-pay | Admitting: Family Medicine

## 2017-08-02 VITALS — BP 112/68 | HR 62 | Temp 98.6°F | Resp 14 | Ht 69.0 in | Wt 207.0 lb

## 2017-08-02 DIAGNOSIS — Z6831 Body mass index (BMI) 31.0-31.9, adult: Secondary | ICD-10-CM

## 2017-08-02 DIAGNOSIS — I2510 Atherosclerotic heart disease of native coronary artery without angina pectoris: Secondary | ICD-10-CM | POA: Diagnosis not present

## 2017-08-02 DIAGNOSIS — E6609 Other obesity due to excess calories: Secondary | ICD-10-CM

## 2017-08-02 LAB — COMPREHENSIVE METABOLIC PANEL
AG Ratio: 1 (calc) (ref 1.0–2.5)
ALBUMIN MSPROF: 4.5 g/dL (ref 3.6–5.1)
ALKALINE PHOSPHATASE (APISO): 75 U/L (ref 40–115)
ALT: 14 U/L (ref 9–46)
AST: 20 U/L (ref 10–35)
BUN: 18 mg/dL (ref 7–25)
CALCIUM: 9.6 mg/dL (ref 8.6–10.3)
CHLORIDE: 100 mmol/L (ref 98–110)
CO2: 28 mmol/L (ref 20–32)
CREATININE: 1.04 mg/dL (ref 0.70–1.25)
Globulin: 4.5 g/dL (calc) — ABNORMAL HIGH (ref 1.9–3.7)
Glucose, Bld: 92 mg/dL (ref 65–99)
POTASSIUM: 4.7 mmol/L (ref 3.5–5.3)
SODIUM: 134 mmol/L — AB (ref 135–146)
TOTAL PROTEIN: 9 g/dL — AB (ref 6.1–8.1)
Total Bilirubin: 0.5 mg/dL (ref 0.2–1.2)

## 2017-08-02 LAB — LIPID PANEL
CHOL/HDL RATIO: 3.2 (calc) (ref <5.0)
Cholesterol: 159 mg/dL (ref <200)
HDL: 50 mg/dL (ref >40)
LDL Cholesterol (Calc): 94 mg/dL (calc)
NON-HDL CHOLESTEROL (CALC): 109 mg/dL (ref <130)
Triglycerides: 67 mg/dL (ref <150)

## 2017-08-02 LAB — CBC WITH DIFFERENTIAL/PLATELET
BASOS ABS: 18 {cells}/uL (ref 0–200)
Basophils Relative: 0.3 %
EOS ABS: 73 {cells}/uL (ref 15–500)
Eosinophils Relative: 1.2 %
HEMATOCRIT: 37.1 % — AB (ref 38.5–50.0)
HEMOGLOBIN: 12.4 g/dL — AB (ref 13.2–17.1)
LYMPHS ABS: 3343 {cells}/uL (ref 850–3900)
MCH: 32.2 pg (ref 27.0–33.0)
MCHC: 33.4 g/dL (ref 32.0–36.0)
MCV: 96.4 fL (ref 80.0–100.0)
MPV: 9.7 fL (ref 7.5–12.5)
Monocytes Relative: 9.7 %
NEUTROS ABS: 2074 {cells}/uL (ref 1500–7800)
Neutrophils Relative %: 34 %
Platelets: 282 10*3/uL (ref 140–400)
RBC: 3.85 10*6/uL — ABNORMAL LOW (ref 4.20–5.80)
RDW: 12.2 % (ref 11.0–15.0)
Total Lymphocyte: 54.8 %
WBC mixed population: 592 cells/uL (ref 200–950)
WBC: 6.1 10*3/uL (ref 3.8–10.8)

## 2017-08-02 NOTE — Progress Notes (Signed)
   Subjective:    Patient ID: Eugene [name], male    DOB: 06-18-1955, 62 y.o.   MRN: [id number]  Patient presents for Follow-up (is not fasting)    Pt here to f/u chronic medical problems   Medications reviewed CAD- he is on plavix and ASA, No abnormal bleeding. No difficulties with beta blocker and ACEI  No chest pain, no SOB  Last seen by cardiology in April, reviewed note  Has not required the nitroglyercin    He has not been exercising, plans to restart walking   He is taking all meds as prescribed  No new concerns    Review Of Systems:  GEN- denies fatigue, fever, weight loss,weakness, recent illness HEENT- denies eye drainage, change in vision, nasal discharge, CVS- denies chest pain, palpitations RESP- denies SOB, cough, wheeze ABD- denies N/V, change in stools, abd pain GU- denies dysuria, hematuria, dribbling, incontinence MSK- denies joint pain, muscle aches, injury Neuro- denies headache, dizziness, syncope, seizure activity       Objective:    BP 112/68   Pulse 62   Temp 98.6 F (37 C) (Oral)   Resp 14   Ht 5\' 9"  (1.753 m)   Wt 207 lb (93.9 kg)   SpO2 97%   BMI 30.57 kg/m  GEN- NAD, alert and oriented x3 HEENT- PERRL, EOMI, non injected sclera, pink conjunctiva, MMM, oropharynx clear Neck- Supple, CVS- RRR, no murmur RESP-CTAB ABD-NABS,soft,NT,ND EXT- No edema Pulses- Radial 2+        Assessment & Plan:      Problem List Items Addressed This Visit      Unprioritized   CAD (coronary artery disease) - Primary    Overall doing well, discussed, exercise, eating healthy, avoiding more weight gain Continue current meds He is to call his cardiologist, for his next appt  Check labs and lipid function      Relevant Orders   CBC with Differential/Platelet   Lipid panel   Comprehensive metabolic panel   Obesity      Note: This dictation was prepared with Dragon dictation along with smaller phrase technology. Any transcriptional errors that  result from this process are unintentional.

## 2017-08-02 NOTE — Patient Instructions (Addendum)
Call Dr. Harl Bowie and schedule F/U for October  F/U January for Physical

## 2017-08-02 NOTE — Assessment & Plan Note (Signed)
Overall doing well, discussed, exercise, eating healthy, avoiding more weight gain Continue current meds He is to call his cardiologist, for his next appt  Check labs and lipid function

## 2017-08-03 ENCOUNTER — Other Ambulatory Visit: Payer: Self-pay | Admitting: *Deleted

## 2017-08-03 DIAGNOSIS — R809 Proteinuria, unspecified: Secondary | ICD-10-CM

## 2017-08-05 ENCOUNTER — Other Ambulatory Visit: Payer: 59

## 2017-08-05 DIAGNOSIS — R809 Proteinuria, unspecified: Secondary | ICD-10-CM | POA: Diagnosis not present

## 2017-08-09 ENCOUNTER — Other Ambulatory Visit: Payer: 59

## 2017-08-09 DIAGNOSIS — R809 Proteinuria, unspecified: Secondary | ICD-10-CM | POA: Diagnosis not present

## 2017-08-09 LAB — COMPLETE METABOLIC PANEL WITH GFR
AG Ratio: 0.9 (calc) — ABNORMAL LOW (ref 1.0–2.5)
ALKALINE PHOSPHATASE (APISO): 66 U/L (ref 40–115)
ALT: 14 U/L (ref 9–46)
AST: 20 U/L (ref 10–35)
Albumin: 4.2 g/dL (ref 3.6–5.1)
BUN: 17 mg/dL (ref 7–25)
CALCIUM: 9.4 mg/dL (ref 8.6–10.3)
CO2: 29 mmol/L (ref 20–32)
CREATININE: 1.07 mg/dL (ref 0.70–1.25)
Chloride: 101 mmol/L (ref 98–110)
GFR, EST NON AFRICAN AMERICAN: 75 mL/min/{1.73_m2} (ref >=60)
GFR, Est African American: 87 mL/min/{1.73_m2} (ref >=60)
GLOBULIN: 4.5 g/dL — AB (ref 1.9–3.7)
GLUCOSE: 87 mg/dL (ref 65–99)
Potassium: 4.6 mmol/L (ref 3.5–5.3)
SODIUM: 135 mmol/L (ref 135–146)
Total Bilirubin: 0.5 mg/dL (ref 0.2–1.2)
Total Protein: 8.7 g/dL — ABNORMAL HIGH (ref 6.1–8.1)

## 2017-08-09 LAB — PROTEIN ELECTROPHORESIS, SERUM
ALBUMIN ELP: 4.1 g/dL (ref 3.8–4.8)
ALPHA 1: 0.3 g/dL (ref 0.2–0.3)
Abnormal Protein Band1: 2.6 g/dL — ABNORMAL HIGH
Alpha 2: 0.6 g/dL (ref 0.5–0.9)
BETA GLOBULIN: 0.4 g/dL (ref 0.4–0.6)
Beta 2: 0.3 g/dL (ref 0.2–0.5)
Gamma Globulin: 2.9 g/dL — ABNORMAL HIGH (ref 0.8–1.7)
TOTAL PROTEIN: 8.6 g/dL — AB (ref 6.1–8.1)

## 2017-08-11 LAB — PROTEIN, TOTAL AND ELECTRO, 24 HR U
ALBUMIN UR 24 HR ELECTRO: 10 %
ALPHA-1-GLOBULIN, U: 2 %
Alpha-2-Globulin, U: 12 %
Beta Globulin, U: 41 %
CREATININE 24H UR: 2.24 g/(24.h) — AB (ref 0.50–2.15)
Gamma Globulin, U: 35 %
Monoclonal Band 1: 40 mg/24 h — ABNORMAL HIGH
PROTEIN 24H UR: 160 mg/(24.h) — AB (ref 0–149)
PROTEIN/CREATININE RATIO: 71 mg/g creat (ref <=114)

## 2017-08-13 ENCOUNTER — Other Ambulatory Visit: Payer: Self-pay | Admitting: *Deleted

## 2017-08-13 DIAGNOSIS — D472 Monoclonal gammopathy: Secondary | ICD-10-CM

## 2017-08-13 DIAGNOSIS — E8809 Other disorders of plasma-protein metabolism, not elsewhere classified: Secondary | ICD-10-CM

## 2017-08-13 DIAGNOSIS — R779 Abnormality of plasma protein, unspecified: Secondary | ICD-10-CM

## 2017-08-17 ENCOUNTER — Other Ambulatory Visit: Payer: Self-pay | Admitting: *Deleted

## 2017-08-17 MED ORDER — CLOPIDOGREL BISULFATE 75 MG PO TABS: 75.0000 mg | ORAL_TABLET | Freq: Every day | ORAL | 3 refills | Status: DC

## 2017-08-17 MED ORDER — LISINOPRIL 2.5 MG PO TABS: 2.5000 mg | ORAL_TABLET | Freq: Every day | ORAL | 3 refills | Status: DC

## 2017-08-17 MED ORDER — METOPROLOL SUCCINATE ER 25 MG PO TB24: 25.0000 mg | ORAL_TABLET | Freq: Every day | ORAL | 3 refills | Status: DC

## 2017-08-17 MED FILL — METOPROLOL SUCCINATE ER 25: 25 | 90 days supply | Qty: 90 | Fill #0

## 2017-08-17 MED FILL — LISINOPRIL 2.5 MG TABLET: 2.5 | 90 days supply | Qty: 90 | Fill #0

## 2017-08-17 MED FILL — CLOPIDOGREL 75 MG TABLET: 75 | 90 days supply | Qty: 90 | Fill #0

## 2017-10-05 ENCOUNTER — Inpatient Hospital Stay (HOSPITAL_COMMUNITY): Payer: 59 | Attending: Internal Medicine | Admitting: Internal Medicine

## 2017-10-05 ENCOUNTER — Other Ambulatory Visit: Payer: Self-pay

## 2017-10-05 ENCOUNTER — Encounter (HOSPITAL_COMMUNITY): Payer: Self-pay | Admitting: Internal Medicine

## 2017-10-05 ENCOUNTER — Inpatient Hospital Stay (HOSPITAL_COMMUNITY): Payer: 59

## 2017-10-05 VITALS — BP 113/71 | HR 52 | Temp 98.1°F | Resp 16 | Ht 69.0 in | Wt 209.3 lb

## 2017-10-05 DIAGNOSIS — R779 Abnormality of plasma protein, unspecified: Secondary | ICD-10-CM | POA: Diagnosis not present

## 2017-10-05 DIAGNOSIS — D472 Monoclonal gammopathy: Secondary | ICD-10-CM

## 2017-10-05 DIAGNOSIS — I2510 Atherosclerotic heart disease of native coronary artery without angina pectoris: Secondary | ICD-10-CM

## 2017-10-05 LAB — COMPREHENSIVE METABOLIC PANEL
ALT: 20 U/L (ref 0–44)
AST: 28 U/L (ref 15–41)
Albumin: 3.9 g/dL (ref 3.5–5.0)
Alkaline Phosphatase: 70 U/L (ref 38–126)
Anion gap: 7 (ref 5–15)
BUN: 17 mg/dL (ref 6–20)
CHLORIDE: 103 mmol/L (ref 98–111)
CO2: 27 mmol/L (ref 22–32)
CREATININE: 1.06 mg/dL (ref 0.61–1.24)
Calcium: 9.2 mg/dL (ref 8.9–10.3)
GFR calc Af Amer: >60 mL/min (ref >60)
GFR calc non Af Amer: >60 mL/min (ref >60)
Glucose, Bld: 100 mg/dL — ABNORMAL HIGH (ref 70–99)
Potassium: 4 mmol/L (ref 3.5–5.1)
SODIUM: 137 mmol/L (ref 135–145)
Total Bilirubin: 0.7 mg/dL (ref 0.3–1.2)
Total Protein: 9.1 g/dL — ABNORMAL HIGH (ref 6.5–8.1)

## 2017-10-05 LAB — CBC WITH DIFFERENTIAL/PLATELET
Basophils Absolute: 0 10*3/uL (ref 0.0–0.1)
Basophils Relative: 0 %
EOS ABS: 0.1 10*3/uL (ref 0.0–0.7)
EOS PCT: 1 %
HCT: 36.8 % — ABNORMAL LOW (ref 39.0–52.0)
HEMOGLOBIN: 12.1 g/dL — AB (ref 13.0–17.0)
LYMPHS ABS: 2.7 10*3/uL (ref 0.7–4.0)
Lymphocytes Relative: 49 %
MCH: 32.4 pg (ref 26.0–34.0)
MCHC: 32.9 g/dL (ref 30.0–36.0)
MCV: 98.4 fL (ref 78.0–100.0)
Monocytes Absolute: 0.6 10*3/uL (ref 0.1–1.0)
Monocytes Relative: 11 %
NEUTROS PCT: 39 %
Neutro Abs: 2.2 10*3/uL (ref 1.7–7.7)
Platelets: 256 10*3/uL (ref 150–400)
RBC: 3.74 MIL/uL — ABNORMAL LOW (ref 4.22–5.81)
RDW: 12.9 % (ref 11.5–15.5)
WBC: 5.7 10*3/uL (ref 4.0–10.5)

## 2017-10-05 LAB — FERRITIN: Ferritin: 210 ng/mL (ref 24–336)

## 2017-10-05 LAB — LACTATE DEHYDROGENASE: LDH: 130 U/L (ref 98–192)

## 2017-10-05 NOTE — Progress Notes (Signed)
Referring physician:  Family medicine.    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  Assessment and Plan:  1.  Proteinemia.  The patient had labs done August 05, 2017 that showed potassium 4.6, creatinine 1, calcium 9.4, normal liver function tests.  SPEP showed M spike in the gamma region with immunofixation recommended.  Hemoglobin was 12.4.  He denies any joint discomfort.  Patient is seen today for consultation due to proteinemia.  Labs performed 10/05/2017 reviewed and showed white count 5.7 hemoglobin 12.1 platelets 256,000 chemistries within normal limits with a potassium of 4 creatinine 1 calcium 9.2 normal liver function test.  I am awaiting results of his serum protein electrophoresis and immunofixation electrophoresis as well as quantitative immunoglobulins and kappa light chains.  I discussed with the patient that he was referred for evaluation of protein on lab check.  I have discussed with him he is undergoing work-up to evaluate for possible myeloma.  He will return to clinic to go over the results of his lab studies and these will be discussed in detail once they have been reviewed.    He is reassured that currently he shows no evidence of renal insufficiency or elevated calcium or anemia.  All questions answered and he and his spouse expressed understanding of the information presented.  2.  Coronary artery disease.  Follow-up with PCP as recommended.  3.  Health  maintenance.  GI and PCP follow-up as recommended.  40 minutes spent with more than 50% spent in counseling and coordination of care.    HPI: 62 year old male referred for evaluation of proteinemia.  The patient had labs done August 05, 2017 that showed potassium 4.6, creatinine 1, calcium 9.4, normal liver function tests.  SPEP showed M spike in the gamma region with immunofixation recommended.  Hemoglobin was 12.4.  He denies any joint discomfort.  Patient is seen today for consultation due to proteinemia.   Problem List Patient Active Problem List   Diagnosis Date Noted  . CAD (coronary artery disease) [I25.10] 01/17/2017  . NSTEMI (non-ST elevated myocardial infarction) ([place]) [I21.4] 01/15/2017  . Obesity [E66.9] 09/14/2014  . Routine general medical examination at a health care facility [Z00.00] 08/24/2013  . Prostate cancer screening [Z12.5] 08/24/2013  . ED (erectile dysfunction) [N52.9] 01/20/2011    Past Medical History Past Medical History:  Diagnosis Date  . Back pain   . CAD (coronary artery disease)    a. 01/2017 NSTEMI/Cath: LM nl, LAD 25p - ? hypodense but no filling defect, RI nl, LCX nl, RCA nl, EF 55-65%-->Med Rx.  . Erectile dysfunction   . History of echocardiogram    a. 01/2017 Echo: EF 55-60%, no rwma.    Past Surgical History Past Surgical History:  Procedure Laterality Date  . LEFT HEART CATH AND CORONARY ANGIOGRAPHY N/A 01/15/2017   Procedure: LEFT HEART CATH AND CORONARY ANGIOGRAPHY;  Surgeon: Martinique, Peter M, MD;  Location: [facility];  Service: Cardiovascular;  Laterality: N/A;    Family History Family History  Problem Relation Age of Onset  . Cancer Mother        breast  . Diabetes Mother   . Diabetes Sister   .  Hypertension Sister   . Cancer Brother   . Mental illness Sister      Social History  reports that he has never smoked. He has never used smokeless tobacco. He reports that he does not drink alcohol or use  drugs.  Medications  Current Outpatient Medications:  .  aspirin 81 MG tablet, Take 81 mg by mouth daily.  , Disp: , Rfl:  .  atorvastatin (LIPITOR) 80 MG tablet, Take 1 tablet (80 mg total) by mouth daily at 6 PM., Disp: 30 tablet, Rfl: 6 .  clopidogrel (PLAVIX) 75 MG tablet, Take 1 tablet (75 mg total) by mouth daily., Disp: 90 tablet, Rfl: 3 .  lisinopril (PRINIVIL,ZESTRIL) 2.5 MG tablet, Take 1 tablet (2.5 mg total) by mouth daily., Disp: 90 tablet, Rfl: 3 .  metoprolol succinate (TOPROL-XL) 25 MG 24 hr tablet, Take 1 tablet (25 mg total) by mouth daily., Disp: 90 tablet, Rfl: 3 .  Multiple Vitamin (MULTIVITAMIN) tablet, Take 1 tablet by mouth daily.  , Disp: , Rfl:  .  nitroGLYCERIN (NITROSTAT) 0.4 MG SL tablet, Place 1 tablet (0.4 mg total) under the tongue every 5 (five) minutes x 3 doses as needed for chest pain., Disp: 25 tablet, Rfl: 3  Allergies Patient has no known allergies.  Review of Systems Review of Systems - Oncology ROS negative   Physical Exam  Vitals Wt Readings from Last 3 Encounters:  10/05/17 209 lb 4.8 oz (94.9 kg)  08/02/17 207 lb (93.9 kg)  05/05/17 204 lb 12.8 oz (92.9 kg)   Temp Readings from Last 3 Encounters:  10/05/17 98.1 F (36.7 C) (Oral)  08/02/17 98.6 F (37 C) (Oral)  02/01/17 98.5 F (36.9 C) (Oral)   BP Readings from Last 3 Encounters:  10/05/17 113/71  08/02/17 112/68  05/05/17 106/88   Pulse Readings from Last 3 Encounters:  10/05/17 (!) 52  08/02/17 62  05/05/17 67     Constitutional: Well-developed, well-nourished, and in no distress.   HENT: Head: Normocephalic and atraumatic.  Mouth/Throat: No oropharyngeal exudate. Mucosa moist. Eyes: Pupils are equal, round, and reactive to light. Conjunctivae are normal. No scleral icterus.  Neck: Normal range of motion. Neck supple. No JVD present.  Cardiovascular: Normal rate, regular rhythm and normal heart sounds.  Exam reveals no gallop and no friction rub.   No murmur  heard. Pulmonary/Chest: Effort normal and breath sounds normal. No respiratory distress. No wheezes.No rales.  Abdominal: Soft. Bowel sounds are normal. No distension. There is no tenderness. There is no guarding.  Musculoskeletal: No edema or tenderness.  Lymphadenopathy: No cervical, axillary or supraclavicular adenopathy.  Neurological: Alert and oriented to person, place, and time. No cranial nerve deficit.  Skin: Skin is warm and dry. No rash noted. No erythema. No pallor.  Psychiatric: Affect and judgment normal.   Labs Office Visit on 10/05/2017  Component Date Value Ref Range Status  . Ferritin 10/05/2017 210  24 - 336 ng/mL Final   Performed at [facility], [address], [place], [place] [address]  . LDH 10/05/2017 130  98 - 192 U/L Final   Performed at [facility], [address], [place], [place] [address]  . Sodium 10/05/2017 137  135 - 145 mmol/L Final  . Potassium 10/05/2017 4.0  3.5 - 5.1 mmol/L Final  . Chloride 10/05/2017 103  98 - 111 mmol/L Final  . CO2 10/05/2017 27  22 - 32 mmol/L Final  . Glucose, Bld 10/05/2017 100* 70 - 99 mg/dL Final  .  BUN 10/05/2017 17  6 - 20 mg/dL Final  . Creatinine, Ser 10/05/2017 1.06  0.61 - 1.24 mg/dL Final  . Calcium 10/05/2017 9.2  8.9 - 10.3 mg/dL Final  . Total Protein 10/05/2017 9.1* 6.5 - 8.1 g/dL Final  . Albumin 10/05/2017 3.9  3.5 - 5.0 g/dL Final  . AST 10/05/2017 28  15 - 41 U/L Final  . ALT 10/05/2017 20  0 - 44 U/L Final  . Alkaline Phosphatase 10/05/2017 70  38 - 126 U/L Final  . Total Bilirubin 10/05/2017 0.7  0.3 - 1.2 mg/dL Final  . GFR calc non Af Amer 10/05/2017 >60  >60 mL/min Final  . GFR calc Af Amer 10/05/2017 >60  >60 mL/min Final   Comment: (NOTE) The eGFR has been calculated using the CKD EPI equation. This calculation has not been validated in all clinical situations. eGFR's persistently <60 mL/min signify possible Chronic Kidney Disease.   Georgiann Hahn gap 10/05/2017 7  5 - 15 Final   Performed at [facility], [address], [place], [place] [address]  . WBC 10/05/2017 5.7  4.0 - 10.5 K/uL Final  . RBC 10/05/2017 3.74* 4.22 - 5.81 MIL/uL Final  . Hemoglobin 10/05/2017 12.1* 13.0 - 17.0 g/dL Final  . HCT 10/05/2017 36.8* 39.0 - 52.0 % Final  . MCV 10/05/2017 98.4  78.0 - 100.0 fL Final  . MCH 10/05/2017 32.4  26.0 - 34.0 pg Final  . MCHC 10/05/2017 32.9  30.0 - 36.0 g/dL Final  . RDW 10/05/2017 12.9  11.5 - 15.5 % Final  . Platelets 10/05/2017 256  150 - 400 K/uL Final  . Neutrophils Relative % 10/05/2017 39  % Final  . Neutro Abs 10/05/2017 2.2  1.7 - 7.7 K/uL Final  . Lymphocytes Relative 10/05/2017 49  % Final  . Lymphs Abs 10/05/2017 2.7  0.7 - 4.0 K/uL Final  . Monocytes Relative 10/05/2017 11  % Final  . Monocytes Absolute 10/05/2017 0.6  0.1 - 1.0 K/uL Final  . Eosinophils Relative 10/05/2017 1  % Final  . Eosinophils Absolute 10/05/2017 0.1  0.0 - 0.7 K/uL Final  . Basophils Relative 10/05/2017 0  % Final  . Basophils Absolute 10/05/2017 0.0  0.0 - 0.1 K/uL Final   Performed at [facility], [address] [place], [place] [address]     Pathology Orders Placed This Encounter  Procedures  . CBC with Differential/Platelet    Standing Status:   Future    Number of Occurrences:   1    Standing Expiration Date:   10/06/2018  . Comprehensive metabolic panel    Standing Status:   Future    Number of Occurrences:   1    Standing Expiration Date:   10/06/2018  . Lactate dehydrogenase    Standing Status:   Future    Number of Occurrences:   1    Standing Expiration Date:   10/06/2018  . Protein electrophoresis, serum    Standing Status:   Future    Number of Occurrences:   1    Standing Expiration Date:   10/06/2018  . Ferritin    Standing Status:   Future    Number of Occurrences:   1    Standing Expiration Date:   10/06/2018  . IgG, IgA, IgM    Standing Status:   Future    Number of Occurrences:   1    Standing Expiration Date:   10/06/2018  . Kappa/lambda light chains  Standing Status:   Future    Number of Occurrences:   1    Standing Expiration Date:   10/06/2018  . Immunofixation electrophoresis    Standing Status:   Future    Number of Occurrences:   1    Standing Expiration Date:   10/06/2018       Zoila Shutter MD

## 2017-10-05 NOTE — Patient Instructions (Signed)
[facility] at [facility] Discharge Instructions    You saw Dr. Walden Field today.  Please have labs drawn today before you leave. You will follow up next week to review labs and discuss you plan.      Thank you for choosing [place] at [facility] to provide your oncology and hematology care.  To afford each patient quality time with our provider, please arrive at least 15 minutes before your scheduled appointment time.   If you have a lab appointment with the [facility] please come in thru the  Main Entrance and check in at the main information desk  You need to re-schedule your appointment should you arrive 10 or more minutes late.  We strive to give you quality time with our providers, and arriving late affects you and other patients whose appointments are after yours.  Also, if you no show three or more times for appointments you may be dismissed from the clinic at the providers discretion.     Again, thank you for choosing [facility].  Our hope is that these requests will decrease the amount of time that you wait before being seen by our physicians.       _____________________________________________________________  Should you have questions after your visit to [facility], please contact our office at [phone] between the hours of 8:00 a.m. and 4:30 p.m.  Voicemails left after 4:00 p.m. will not be returned until the following business day.  For prescription refill requests, have your pharmacy contact our office and allow 72 hours.    Cancer Center Support Programs:   > Cancer Support Group  2nd Tuesday of the month 1pm-2pm, Journey Room

## 2017-10-06 LAB — PROTEIN ELECTROPHORESIS, SERUM
A/G Ratio: 0.8 (ref 0.7–1.7)
Albumin ELP: 3.8 g/dL (ref 2.9–4.4)
Alpha-1-Globulin: 0.2 g/dL (ref 0.0–0.4)
Alpha-2-Globulin: 0.6 g/dL (ref 0.4–1.0)
BETA GLOBULIN: 0.9 g/dL (ref 0.7–1.3)
GAMMA GLOBULIN: 2.8 g/dL — AB (ref 0.4–1.8)
Globulin, Total: 4.5 g/dL — ABNORMAL HIGH (ref 2.2–3.9)
M-Spike, %: 2.5 g/dL — ABNORMAL HIGH
Total Protein ELP: 8.3 g/dL (ref 6.0–8.5)

## 2017-10-06 LAB — IMMUNOFIXATION ELECTROPHORESIS
IgA: 129 mg/dL (ref 90–386)
IgG (Immunoglobin G), Serum: 3636 mg/dL — ABNORMAL HIGH (ref 700–1600)
IgM (Immunoglobulin M), Srm: 19 mg/dL — ABNORMAL LOW (ref 20–172)
Total Protein ELP: 8.5 g/dL (ref 6.0–8.5)

## 2017-10-06 LAB — IGG, IGA, IGM
IGA: 133 mg/dL (ref 90–386)
IGG (IMMUNOGLOBIN G), SERUM: 3565 mg/dL — AB (ref 700–1600)
IgM (Immunoglobulin M), Srm: 19 mg/dL — ABNORMAL LOW (ref 20–172)

## 2017-10-06 LAB — KAPPA/LAMBDA LIGHT CHAINS
KAPPA, LAMDA LIGHT CHAIN RATIO: 5.83 — AB (ref 0.26–1.65)
Kappa free light chain: 70.6 mg/L — ABNORMAL HIGH (ref 3.3–19.4)
LAMDA FREE LIGHT CHAINS: 12.1 mg/L (ref 5.7–26.3)

## 2017-10-22 ENCOUNTER — Ambulatory Visit (HOSPITAL_COMMUNITY)
Admission: RE | Admit: 2017-10-22 | Discharge: 2017-10-22 | Disposition: A | Discharge status: Home / Self Care | Payer: 59 | Source: Ambulatory Visit | Attending: Internal Medicine | Admitting: Internal Medicine

## 2017-10-22 ENCOUNTER — Encounter (HOSPITAL_COMMUNITY): Payer: Self-pay | Admitting: Internal Medicine

## 2017-10-22 ENCOUNTER — Inpatient Hospital Stay (HOSPITAL_COMMUNITY): Payer: 59 | Attending: Internal Medicine | Admitting: Internal Medicine

## 2017-10-22 ENCOUNTER — Other Ambulatory Visit: Payer: Self-pay

## 2017-10-22 VITALS — BP 116/56 | HR 52 | Temp 98.2°F | Resp 16 | Wt 208.0 lb

## 2017-10-22 DIAGNOSIS — I2510 Atherosclerotic heart disease of native coronary artery without angina pectoris: Secondary | ICD-10-CM | POA: Insufficient documentation

## 2017-10-22 DIAGNOSIS — D472 Monoclonal gammopathy: Secondary | ICD-10-CM | POA: Insufficient documentation

## 2017-10-22 NOTE — Progress Notes (Signed)
Diagnosis Monoclonal gammopathy present on serum protein electrophoresis - Plan: DG Bone Survey Met, CBC with Differential/Platelet, Comprehensive metabolic panel, Lactate dehydrogenase, Protein electrophoresis, serum, IgG, IgA, IgM, Kappa/lambda light chains  Staging Cancer Staging No matching staging information was found for the patient.  Assessment and Plan:  1.  Monoclonal gammopathy.  The patient had labs done August 05, 2017 that showed potassium 4.6, creatinine 1, calcium 9.4, normal liver function tests.  SPEP showed M spike in the gamma region with immunofixation recommended.  Hemoglobin was 12.4.  He denies any joint discomfort.   Labs performed 10/05/2017 reviewed and showed white count 5.7 hemoglobin 12.1 platelets 256,000 chemistries within normal limits with a potassium of 4 creatinine 1 calcium 9.2 normal liver function test. SPEP shows monoclonal protein measuring 2.5 g/dl IgG kappa.  K/L light chain ratio was 5.83.  Quant IG show elevated IGG of 3636.    Long talk held with pt and wife today.  I discussed with them that he has evidence of monoclonal gammopathy.  He has no evidence of anemia, RI or elevated calcium.  Currently he meets criteria for monoclonal gammopathy.    I discussed with them he would be recommended for skeletal survey and bone marrow biopsy to complete his work-up.  He reports he does not desire to do bone marrow biopsy or x-rays.  After discussion, he is willing to have skeletal survey which was done today 10/22/2017 and was negative for lytic lesions.    Pt desires to remain on observation and will not agree to bone marrow biopsy currently.  He will have repeat labs in 01/2018 and will follow-up at that time to go over results.  I have provided them written information regarding MGUS.  All question answered and pt expressed understanding of information presented.    2.  Coronary artery disease.  Follow-up with PCP as recommended.  3.  Health maintenance.  GI  and PCP follow-up as recommended.  Greater than 25 minutes spent with more than 50% spent in counseling and coordination of care.    Interval history:  Historical data obtained from note dated 10/05/2017:  62 year old male referred for evaluation of proteinemia.  The patient had labs done August 05, 2017 that showed potassium 4.6, creatinine 1, calcium 9.4, normal liver function tests.  SPEP showed M spike in the gamma region with immunofixation recommended.  Hemoglobin was 12.4.  He denies any joint discomfort.  Patient is seen today for consultation due to proteinemia.  Current Status:  Pt is seen today for follow-up.  He is here to go over labs and is accompanied by his wife.    Problem List Patient Active Problem List   Diagnosis Date Noted  . CAD (coronary artery disease) [I25.10] 01/17/2017  . NSTEMI (non-ST elevated myocardial infarction) ([place]) [I21.4] 01/15/2017  . Obesity [E66.9] 09/14/2014  . Routine general medical examination at a health care facility [Z00.00] 08/24/2013  . Prostate cancer screening [Z12.5] 08/24/2013  . ED (erectile dysfunction) [N52.9] 01/20/2011    Past Medical History Past Medical History:  Diagnosis Date  . Back pain   . CAD (coronary artery disease)    a. 01/2017 NSTEMI/Cath: LM nl, LAD 25p - ? hypodense but no filling defect, RI nl, LCX nl, RCA nl, EF 55-65%-->Med Rx.  . Erectile dysfunction   . History of echocardiogram    a. 01/2017 Echo: EF 55-60%, no rwma.    Past Surgical History Past Surgical History:  Procedure Laterality Date  . LEFT HEART CATH  AND CORONARY ANGIOGRAPHY N/A 01/15/2017   Procedure: LEFT HEART CATH AND CORONARY ANGIOGRAPHY;  Surgeon: Martinique, Peter M, MD;  Location: [facility];  Service: Cardiovascular;  Laterality: N/A;    Family History Family History  Problem Relation Age of Onset  . Cancer Mother        breast  . Diabetes Mother   . Diabetes Sister   . Hypertension Sister   . Cancer Brother   . Mental illness  Sister      Social History  reports that he has never smoked. He has never used smokeless tobacco. He reports that he does not drink alcohol or use drugs.  Medications  Current Outpatient Medications:  .  aspirin 81 MG tablet, Take 81 mg by mouth daily.  , Disp: , Rfl:  .  atorvastatin (LIPITOR) 80 MG tablet, Take 1 tablet (80 mg total) by mouth daily at 6 PM., Disp: 30 tablet, Rfl: 6 .  clopidogrel (PLAVIX) 75 MG tablet, Take 1 tablet (75 mg total) by mouth daily., Disp: 90 tablet, Rfl: 3 .  lisinopril (PRINIVIL,ZESTRIL) 2.5 MG tablet, Take 1 tablet (2.5 mg total) by mouth daily., Disp: 90 tablet, Rfl: 3 .  metoprolol succinate (TOPROL-XL) 25 MG 24 hr tablet, Take 1 tablet (25 mg total) by mouth daily., Disp: 90 tablet, Rfl: 3 .  Multiple Vitamin (MULTIVITAMIN) tablet, Take 1 tablet by mouth daily.  , Disp: , Rfl:  .  nitroGLYCERIN (NITROSTAT) 0.4 MG SL tablet, Place 1 tablet (0.4 mg total) under the tongue every 5 (five) minutes x 3 doses as needed for chest pain., Disp: 25 tablet, Rfl: 3  Allergies Patient has no known allergies.  Review of Systems Review of Systems - Oncology ROS negative   Physical Exam  Vitals Wt Readings from Last 3 Encounters:  10/22/17 208 lb (94.3 kg)  10/05/17 209 lb 4.8 oz (94.9 kg)  08/02/17 207 lb (93.9 kg)   Temp Readings from Last 3 Encounters:  10/22/17 98.2 F (36.8 C) (Oral)  10/05/17 98.1 F (36.7 C) (Oral)  08/02/17 98.6 F (37 C) (Oral)   BP Readings from Last 3 Encounters:  10/22/17 (!) 116/56  10/05/17 113/71  08/02/17 112/68   Pulse Readings from Last 3 Encounters:  10/22/17 (!) 52  10/05/17 (!) 52  08/02/17 62   Constitutional: Well-developed, well-nourished, and in no distress.   HENT:Head: Normocephalic and atraumatic.  Mouth/Throat: No oropharyngeal exudate. Mucosa moist. Eyes: Pupils are equal, round, and reactive to light. Conjunctivae are normal. No scleral icterus.  Neck: Normal range of motion. Neck supple. No  JVD present.  Cardiovascular: Normal rate, regular rhythm and normal heart sounds.  Exam reveals no gallop and no friction rub.   No murmur heard. Pulmonary/Chest: Effort normal and breath sounds normal. No respiratory distress. No wheezes.No rales.  Abdominal: Soft. Bowel sounds are normal. No distension. There is no tenderness. There is no guarding.  Musculoskeletal: No edema or tenderness.  Lymphadenopathy: No cervical, axillary or supraclavicular adenopathy.  Neurological: Alert and oriented to person, place, and time. No cranial nerve deficit.  Skin: Skin is warm and dry. No rash noted. No erythema. No pallor.  Psychiatric: Affect and judgment normal.   Labs No visits with results within 3 Day(s) from this visit.  Latest known visit with results is:  Office Visit on 10/05/2017  Component Date Value Ref Range Status  . Total Protein ELP 10/05/2017 8.5  6.0 - 8.5 g/dL Final  . IgG (Immunoglobin G), Serum 10/05/2017  3,636* 700 - 1,600 mg/dL Final  . IgA 10/05/2017 129  90 - 386 mg/dL Final  . IgM (Immunoglobulin M), Srm 10/05/2017 19* 20 - 172 mg/dL Final   Comment: (NOTE) Result confirmed on concentration. Performed At: [facility] [place], [place] [id number] Rush Farmer MD [id number]   . Immunofixation Result, Serum 10/05/2017 Comment   Corrected   Comment: (NOTE) Immunofixation shows IgG monoclonal protein with kappa light chain specificity. Please note that samples from patients receiving DARZALEX(R) (daratumumab) treatment can appear as an "IgG kappa" and mask a complete response. If this patient is receiving DARA, this IFE assay interference can be removed by ordering test number [id number]-"Immunofixation, Daratumumab-Specific, Serum" and submitting a new sample for testing or by calling the lab to add this test to the current sample.   . Kappa free light chain 10/05/2017 70.6* 3.3 - 19.4 mg/L Final  . Lamda free light chains 10/05/2017 12.1   5.7 - 26.3 mg/L Final  . Kappa, lamda light chain ratio 10/05/2017 5.83* 0.26 - 1.65 Final   Comment: (NOTE) Performed At: [facility] [address] [place], [place] [id number] Rush Farmer MD [id number]   . IgG (Immunoglobin G), Serum 10/05/2017 3,565* 700 - 1,600 mg/dL Final  . IgA 10/05/2017 133  90 - 386 mg/dL Final  . IgM (Immunoglobulin M), Srm 10/05/2017 19* 20 - 172 mg/dL Final   Comment: (NOTE) Result confirmed on concentration. Performed At: [facility] [place], [place] [id number] Rush Farmer MD [id number]   . Ferritin 10/05/2017 210  24 - 336 ng/mL Final   Performed at [facility], [address] [place], [place] [address]  . Total Protein ELP 10/05/2017 8.3  6.0 - 8.5 g/dL Final  . Albumin ELP 10/05/2017 3.8  2.9 - 4.4 g/dL Final  . Alpha-1-Globulin 10/05/2017 0.2  0.0 - 0.4 g/dL Final  . Alpha-2-Globulin 10/05/2017 0.6  0.4 - 1.0 g/dL Final  . Beta Globulin 10/05/2017 0.9  0.7 - 1.3 g/dL Final  . Gamma Globulin 10/05/2017 2.8* 0.4 - 1.8 g/dL Final  . M-Spike, % 10/05/2017 2.5* Not Observed g/dL Final  . SPE Interp. 10/05/2017 Comment   Final   Comment: (NOTE) The SPE pattern demonstrates a single peak (M-spike) in the gamma region which may represent monoclonal protein. This peak may also be caused by circulating immune complexes, cryoglobulins, C-reactive protein, fibrinogen or hemolysis.  If clinically indicated, the presence of a monoclonal gammopathy may be confirmed by immuno- fixation, as well as an evaluation of the urine for the presence of Bence-Jones protein. Performed At: [facility] [place], [place] [id number] Rush Farmer MD [id number]   . Comment 10/05/2017 Comment   Final   Comment: (NOTE) Protein electrophoresis scan will follow via computer, mail, or courier delivery.   Marland Kitchen GLOBULIN, TOTAL 10/05/2017 4.5* 2.2 - 3.9 g/dL Corrected  . A/G Ratio 10/05/2017 0.8  0.7 - 1.7  Corrected  . LDH 10/05/2017 130  98 - 192 U/L Final   Performed at [facility], [address], [place], [place] [address]  . Sodium 10/05/2017 137  135 - 145 mmol/L Final  . Potassium 10/05/2017 4.0  3.5 - 5.1 mmol/L Final  . Chloride 10/05/2017 103  98 - 111 mmol/L Final  . CO2 10/05/2017 27  22 - 32 mmol/L Final  . Glucose, Bld 10/05/2017 100* 70 - 99 mg/dL Final  . BUN 10/05/2017 17  6 - 20 mg/dL Final  . Creatinine, Ser 10/05/2017  1.06  0.61 - 1.24 mg/dL Final  . Calcium 10/05/2017 9.2  8.9 - 10.3 mg/dL Final  . Total Protein 10/05/2017 9.1* 6.5 - 8.1 g/dL Final  . Albumin 10/05/2017 3.9  3.5 - 5.0 g/dL Final  . AST 10/05/2017 28  15 - 41 U/L Final  . ALT 10/05/2017 20  0 - 44 U/L Final  . Alkaline Phosphatase 10/05/2017 70  38 - 126 U/L Final  . Total Bilirubin 10/05/2017 0.7  0.3 - 1.2 mg/dL Final  . GFR calc non Af Amer 10/05/2017 >60  >60 mL/min Final  . GFR calc Af Amer 10/05/2017 >60  >60 mL/min Final   Comment: (NOTE) The eGFR has been calculated using the CKD EPI equation. This calculation has not been validated in all clinical situations. eGFR's persistently <60 mL/min signify possible Chronic Kidney Disease.   Georgiann Hahn gap 10/05/2017 7  5 - 15 Final   Performed at [facility], [address] [place], [place] [address]  . WBC 10/05/2017 5.7  4.0 - 10.5 K/uL Final  . RBC 10/05/2017 3.74* 4.22 - 5.81 MIL/uL Final  . Hemoglobin 10/05/2017 12.1* 13.0 - 17.0 g/dL Final  . HCT 10/05/2017 36.8* 39.0 - 52.0 % Final  . MCV 10/05/2017 98.4  78.0 - 100.0 fL Final  . MCH 10/05/2017 32.4  26.0 - 34.0 pg Final  . MCHC 10/05/2017 32.9  30.0 - 36.0 g/dL Final  . RDW 10/05/2017 12.9  11.5 - 15.5 % Final  . Platelets 10/05/2017 256  150 - 400 K/uL Final  . Neutrophils Relative % 10/05/2017 39  % Final  . Neutro Abs 10/05/2017 2.2  1.7 - 7.7 K/uL Final  . Lymphocytes Relative 10/05/2017 49  % Final  . Lymphs Abs 10/05/2017 2.7  0.7 - 4.0 K/uL Final  . Monocytes Relative 10/05/2017  11  % Final  . Monocytes Absolute 10/05/2017 0.6  0.1 - 1.0 K/uL Final  . Eosinophils Relative 10/05/2017 1  % Final  . Eosinophils Absolute 10/05/2017 0.1  0.0 - 0.7 K/uL Final  . Basophils Relative 10/05/2017 0  % Final  . Basophils Absolute 10/05/2017 0.0  0.0 - 0.1 K/uL Final   Performed at [facility], [address], [place], [place] [address]     Pathology Orders Placed This Encounter  Procedures  . DG Bone Survey Met    Standing Status:   Future    Number of Occurrences:   1    Standing Expiration Date:   12/23/2018    Order Specific Question:   Reason for Exam (SYMPTOM  OR DIAGNOSIS REQUIRED)    Answer:   monoclonal gammopathy    Order Specific Question:   Preferred imaging location?    Answer:   [facility]    Order Specific Question:   Radiology Contrast Protocol - do NOT remove file path    Answer:   [email or web address]  . CBC with Differential/Platelet    Standing Status:   Future    Standing Expiration Date:   10/23/2019  . Comprehensive metabolic panel    Standing Status:   Future    Standing Expiration Date:   10/23/2019  . Lactate dehydrogenase    Standing Status:   Future    Standing Expiration Date:   10/23/2019  . Protein electrophoresis, serum    Standing Status:   Future    Standing Expiration Date:   10/23/2019  . IgG, IgA, IgM    Standing Status:   Future    Standing  Expiration Date:   10/23/2019  . Kappa/lambda light chains    Standing Status:   Future    Standing Expiration Date:   10/23/2019       Zoila Shutter MD

## 2017-10-25 ENCOUNTER — Telehealth (HOSPITAL_COMMUNITY): Payer: Self-pay

## 2017-10-25 NOTE — Telephone Encounter (Signed)
Spoke with the pt per Dr. Walden Field request to let him know his bone survey was negative. Pt verbalized understanding and has his F/U appts for Jan 2020

## 2017-11-11 ENCOUNTER — Ambulatory Visit: Payer: 59 | Admitting: Cardiology

## 2017-11-11 ENCOUNTER — Encounter: Payer: Self-pay | Admitting: Cardiology

## 2017-11-11 VITALS — BP 124/74 | HR 65 | Ht 69.0 in | Wt 212.0 lb

## 2017-11-11 DIAGNOSIS — I10 Essential (primary) hypertension: Secondary | ICD-10-CM | POA: Diagnosis not present

## 2017-11-11 DIAGNOSIS — E782 Mixed hyperlipidemia: Secondary | ICD-10-CM

## 2017-11-11 DIAGNOSIS — I2510 Atherosclerotic heart disease of native coronary artery without angina pectoris: Secondary | ICD-10-CM

## 2017-11-11 MED ORDER — ROSUVASTATIN CALCIUM 40 MG PO TABS: 40.0000 mg | ORAL_TABLET | Freq: Every day | ORAL | 3 refills | Status: DC

## 2017-11-11 NOTE — Patient Instructions (Signed)
Medication Instructions:  Stop plavix January 15, 2018   STOP ATORVASTATIN  START CRESTOR 40 MG DAILY   Labwork: NONE  Testing/Procedures: NONE  Follow-Up: Your physician wants you to follow-up in: 6 MONTHS.  You will receive a reminder letter in the mail two months in advance. If you don't receive a letter, please call our office to schedule the follow-up appointment.   Any Other Special Instructions Will Be Listed Below (If Applicable).     If you need a refill on your cardiac medications before your next appointment, please call your pharmacy.

## 2017-11-11 NOTE — Progress Notes (Signed)
Clinical Summary Mr. Wanzer is a 62 y.o.male seen today for follow up of the following medical problems.   1. CAD - admit Jan 2019 with NSTEMI, peak trop 5.58 - cath without significant disease, questionable LAD plaque erosion.  - jan 2019 echo LVEF 55-60%  - no recent chest pain - compliant with meds.    2. HTN -he is compliant with meds   3. Hyperlipidemia - compliant with statin - 07/2017 TC 159 HDL 50 TG 67 LDL 94  SH: works at [employer].     Past Medical History:  Diagnosis Date  . Back pain   . CAD (coronary artery disease)    a. 01/2017 NSTEMI/Cath: LM nl, LAD 25p - ? hypodense but no filling defect, RI nl, LCX nl, RCA nl, EF 55-65%-->Med Rx.  . Erectile dysfunction   . History of echocardiogram    a. 01/2017 Echo: EF 55-60%, no rwma.     No Known Allergies   Current Outpatient Medications  Medication Sig Dispense Refill  . aspirin 81 MG tablet Take 81 mg by mouth daily.      Marland Kitchen atorvastatin (LIPITOR) 80 MG tablet Take 1 tablet (80 mg total) by mouth daily at 6 PM. 30 tablet 6  . clopidogrel (PLAVIX) 75 MG tablet Take 1 tablet (75 mg total) by mouth daily. 90 tablet 3  . lisinopril (PRINIVIL,ZESTRIL) 2.5 MG tablet Take 1 tablet (2.5 mg total) by mouth daily. 90 tablet 3  . metoprolol succinate (TOPROL-XL) 25 MG 24 hr tablet Take 1 tablet (25 mg total) by mouth daily. 90 tablet 3  . Multiple Vitamin (MULTIVITAMIN) tablet Take 1 tablet by mouth daily.      . nitroGLYCERIN (NITROSTAT) 0.4 MG SL tablet Place 1 tablet (0.4 mg total) under the tongue every 5 (five) minutes x 3 doses as needed for chest pain. 25 tablet 3   No current facility-administered medications for this visit.      Past Surgical History:  Procedure Laterality Date  . LEFT HEART CATH AND CORONARY ANGIOGRAPHY N/A 01/15/2017   Procedure: LEFT HEART CATH AND CORONARY ANGIOGRAPHY;  Surgeon: Martinique, Peter M, MD;  Location: [facility];  Service: Cardiovascular;   Laterality: N/A;     No Known Allergies    Family History  Problem Relation Age of Onset  . Cancer Mother        breast  . Diabetes Mother   . Diabetes Sister   . Hypertension Sister   . Cancer Brother   . Mental illness Sister      Social History Mr. Cronkright reports that he has never smoked. He has never used smokeless tobacco. Mr. Koerber reports that he does not drink alcohol.   Review of Systems CONSTITUTIONAL: No weight loss, fever, chills, weakness or fatigue.  HEENT: Eyes: No visual loss, blurred vision, double vision or yellow sclerae.No hearing loss, sneezing, congestion, runny nose or sore throat.  SKIN: No rash or itching.  CARDIOVASCULAR: per hpi RESPIRATORY: No shortness of breath, cough or sputum.  GASTROINTESTINAL: No anorexia, nausea, vomiting or diarrhea. No abdominal pain or blood.  GENITOURINARY: No burning on urination, no polyuria NEUROLOGICAL: No headache, dizziness, syncope, paralysis, ataxia, numbness or tingling in the extremities. No change in bowel or bladder control.  MUSCULOSKELETAL: No muscle, back pain, joint pain or stiffness.  LYMPHATICS: No enlarged nodes. No history of splenectomy.  PSYCHIATRIC: No history of depression or anxiety.  ENDOCRINOLOGIC: No reports of sweating, cold or heat  intolerance. No polyuria or polydipsia.  Marland Kitchen   Physical Examination Vitals:   11/11/17 1251  BP: 124/74  Pulse: 65  SpO2: 97%   Filed Weights   11/11/17 1251  Weight: 212 lb (96.2 kg)    Gen: resting comfortably, no acute distress HEENT: no scleral icterus, pupils equal round and reactive, no palptable cervical adenopathy,  CV: RRR, no m/r/g, no jvd Resp: Clear to auscultation bilaterally GI: abdomen is soft, non-tender, non-distended, normal bowel sounds, no hepatosplenomegaly MSK: extremities are warm, no edema.  Skin: warm, no rash Neuro:  no focal deficits Psych: appropriate affect   Diagnostic Studies Jan 2019 echo Study  Conclusions  - Left ventricle: The cavity size was normal. Wall thickness was normal. Systolic function was normal. The estimated ejection fraction was in the range of 55% to 60%. Wall motion was normal; there were no regional wall motion abnormalities. Left ventricular diastolic function parameters were normal. - Left atrium: The atrium was mildly dilated. - Pulmonary arteries: PA peak pressure: 33 mm Hg (S).  Jan 2019 cath  Prox LAD lesion is 25% stenosed.  The left ventricular systolic function is normal.  LV end diastolic pressure is normal.  The left ventricular ejection fraction is 55-65% by visual estimate.  1. No significant obstructive CAD. Initial views were concerning for hypodensity in the proximal LAD but there was poor filling due to poor catheter engagement. When a guide catheter was used with better filling this was less with no filling defect noted. 2. Normal LV function 3. Normal LVEDP  Plan: medical therapy. Cycle cardiac enzymes and Ecg. Check Echo.     Assessment and Plan  1. CAD - doing well without symptoms, he will stop plavix Jan 2020  2. HTN -at goal, continue current meds  3. Hyperlipidemia - goal LDL would be <70 for him given his history of CAD and ACS - d/c atorvastatin 80, start crestor 40mg  daily.        Arnoldo Lenis, M.D.

## 2017-11-22 MED FILL — METOPROLOL SUCCINATE ER 25: 25 | 90 days supply | Qty: 90 | Fill #1

## 2017-11-22 MED FILL — CLOPIDOGREL 75 MG TABLET: 75 | 90 days supply | Qty: 90 | Fill #1

## 2017-11-22 MED FILL — LISINOPRIL 2.5 MG TABLET: 2.5 | 90 days supply | Qty: 90 | Fill #1

## 2018-02-01 ENCOUNTER — Other Ambulatory Visit (HOSPITAL_COMMUNITY): Payer: 59

## 2018-02-08 ENCOUNTER — Ambulatory Visit (HOSPITAL_COMMUNITY): Payer: 59 | Admitting: Internal Medicine

## 2018-02-23 MED FILL — LISINOPRIL 2.5 MG TABLET: 2.5 | 90 days supply | Qty: 90 | Fill #2

## 2018-02-23 MED FILL — METOPROLOL SUCCINATE ER 25: 25 | 90 days supply | Qty: 90 | Fill #2

## 2018-05-18 MED FILL — LISINOPRIL 2.5 MG TABLET: 2.5 | 90 days supply | Qty: 90 | Fill #0

## 2018-05-18 MED FILL — METOPROLOL SUCCINATE ER 25: 25 | 90 days supply | Qty: 90 | Fill #0

## 2018-06-24 ENCOUNTER — Encounter (HOSPITAL_COMMUNITY): Payer: Self-pay

## 2018-06-24 ENCOUNTER — Emergency Department (HOSPITAL_COMMUNITY)
Admission: EM | Admit: 2018-06-24 | Discharge: 2018-06-24 | Disposition: A | Discharge status: Home / Self Care | Payer: 59 | Attending: Emergency Medicine | Admitting: Emergency Medicine

## 2018-06-24 ENCOUNTER — Emergency Department (HOSPITAL_COMMUNITY): Payer: 59

## 2018-06-24 ENCOUNTER — Other Ambulatory Visit: Payer: Self-pay

## 2018-06-24 DIAGNOSIS — Z79899 Other long term (current) drug therapy: Secondary | ICD-10-CM | POA: Diagnosis not present

## 2018-06-24 DIAGNOSIS — Z7982 Long term (current) use of aspirin: Secondary | ICD-10-CM | POA: Insufficient documentation

## 2018-06-24 DIAGNOSIS — R0789 Other chest pain: Secondary | ICD-10-CM | POA: Insufficient documentation

## 2018-06-24 DIAGNOSIS — R079 Chest pain, unspecified: Secondary | ICD-10-CM

## 2018-06-24 DIAGNOSIS — I2510 Atherosclerotic heart disease of native coronary artery without angina pectoris: Secondary | ICD-10-CM | POA: Diagnosis not present

## 2018-06-24 LAB — CBC WITH DIFFERENTIAL/PLATELET
Abs Immature Granulocytes: 0.02 10*3/uL (ref 0.00–0.07)
Basophils Absolute: 0 10*3/uL (ref 0.0–0.1)
Basophils Relative: 0 %
Eosinophils Absolute: 0.1 10*3/uL (ref 0.0–0.5)
Eosinophils Relative: 1 %
HCT: 33.6 % — ABNORMAL LOW (ref 39.0–52.0)
Hemoglobin: 10.9 g/dL — ABNORMAL LOW (ref 13.0–17.0)
Immature Granulocytes: 0 %
Lymphocytes Relative: 55 %
Lymphs Abs: 3.2 10*3/uL (ref 0.7–4.0)
MCH: 32.5 pg (ref 26.0–34.0)
MCHC: 32.4 g/dL (ref 30.0–36.0)
MCV: 100.3 fL — ABNORMAL HIGH (ref 80.0–100.0)
Monocytes Absolute: 0.7 10*3/uL (ref 0.1–1.0)
Monocytes Relative: 11 %
Neutro Abs: 1.9 10*3/uL (ref 1.7–7.7)
Neutrophils Relative %: 33 %
Platelets: 243 10*3/uL (ref 150–400)
RBC: 3.35 MIL/uL — ABNORMAL LOW (ref 4.22–5.81)
RDW: 13 % (ref 11.5–15.5)
WBC: 5.9 10*3/uL (ref 4.0–10.5)
nRBC: 0 % (ref 0.0–0.2)

## 2018-06-24 LAB — BASIC METABOLIC PANEL
Anion gap: 7 (ref 5–15)
BUN: 19 mg/dL (ref 8–23)
CO2: 26 mmol/L (ref 22–32)
Calcium: 8.8 mg/dL — ABNORMAL LOW (ref 8.9–10.3)
Chloride: 103 mmol/L (ref 98–111)
Creatinine, Ser: 1.11 mg/dL (ref 0.61–1.24)
GFR calc Af Amer: >60 mL/min (ref >60)
GFR calc non Af Amer: >60 mL/min (ref >60)
Glucose, Bld: 108 mg/dL — ABNORMAL HIGH (ref 70–99)
Potassium: 3.9 mmol/L (ref 3.5–5.1)
Sodium: 136 mmol/L (ref 135–145)

## 2018-06-24 LAB — TROPONIN I: Troponin I: <0.03 ng/mL (ref <0.03)

## 2018-06-24 NOTE — ED Notes (Signed)
Patient transported to X-ray 

## 2018-06-24 NOTE — Discharge Instructions (Signed)
Return to the emergency department if you develop worsening pain, difficulty breathing, high fever, or other new and concerning symptoms.

## 2018-06-24 NOTE — ED Triage Notes (Signed)
Pt reports onset of chest tightness at 6 pm, states he felt it was heartburn and took some alka seltzer which didn't work.  Pt then took one nitro with some improvement of pain.

## 2018-06-24 NOTE — ED Provider Notes (Signed)
[facility] EMERGENCY DEPARTMENT Provider Note   CSN: [id number] Arrival date & time: 06/24/18  0110     History   Chief Complaint Chief Complaint  Patient presents with  . Chest Pain    HPI Eugene [name] is a 63 y.o. male.     Patient is a 63 year old male with past medical history of mild coronary artery disease.  He presents today for evaluation of chest discomfort.  This began at approximately 6 PM after waking from sleep.  Patient describes a tightness in the center of his chest with no associated shortness of breath, nausea, diaphoresis, or radiation to the arm or jaw.  He states he attempted to eat a slice of pizza, however this made it worse.  He denies any recent exertional symptoms.  He denies any fevers, chills, or cough.  He states that he used a ntg with slight relief.  From reviewing the patient's record, he had a heart catheterization done in January 2019.  This revealed nonocclusive coronary artery disease with a 25% lesion in his LAD.  No stents were placed and the patient has been doing well since.  The history is provided by the patient.  Chest Pain Pain location:  Substernal area Pain quality: tightness   Pain radiates to:  Does not radiate Pain severity:  Mild Duration:  1 hour Timing:  Constant Progression:  Partially resolved Chronicity:  New Relieved by: Partial relief with nitroglycerin. Worsened by:  Nothing Ineffective treatments:  Antacids   Past Medical History:  Diagnosis Date  . Back pain   . CAD (coronary artery disease)    a. 01/2017 NSTEMI/Cath: LM nl, LAD 25p - ? hypodense but no filling defect, RI nl, LCX nl, RCA nl, EF 55-65%-->Med Rx.  . Erectile dysfunction   . History of echocardiogram    a. 01/2017 Echo: EF 55-60%, no rwma.    Patient Active Problem List   Diagnosis Date Noted  . CAD (coronary artery disease) 01/17/2017  . NSTEMI (non-ST elevated myocardial infarction) ([place]) 01/15/2017  . Obesity 09/14/2014  . Routine  general medical examination at a health care facility 08/24/2013  . Prostate cancer screening 08/24/2013  . ED (erectile dysfunction) 01/20/2011    Past Surgical History:  Procedure Laterality Date  . LEFT HEART CATH AND CORONARY ANGIOGRAPHY N/A 01/15/2017   Procedure: LEFT HEART CATH AND CORONARY ANGIOGRAPHY;  Surgeon: Martinique, Peter M, MD;  Location: [facility];  Service: Cardiovascular;  Laterality: N/A;        Home Medications    Prior to Admission medications   Medication Sig Start Date End Date Taking? Authorizing Provider  aspirin 81 MG tablet Take 81 mg by mouth daily.     Yes [provider]  lisinopril (PRINIVIL,ZESTRIL) 2.5 MG tablet Take 1 tablet (2.5 mg total) by mouth daily. 08/17/17  Yes BranchAlphonse Guild, MD  metoprolol succinate (TOPROL-XL) 25 MG 24 hr tablet Take 1 tablet (25 mg total) by mouth daily. 08/17/17  Yes BranchAlphonse Guild, MD  Multiple Vitamin (MULTIVITAMIN) tablet Take 1 tablet by mouth daily.     Yes [provider]  nitroGLYCERIN (NITROSTAT) 0.4 MG SL tablet Place 1 tablet (0.4 mg total) under the tongue every 5 (five) minutes x 3 doses as needed for chest pain. 01/17/17  Yes Theora Gianotti, NP  clopidogrel (PLAVIX) 75 MG tablet Take 1 tablet (75 mg total) by mouth daily. 08/17/17   Arnoldo Lenis, MD  rosuvastatin (CRESTOR) 40 MG tablet Take  1 tablet (40 mg total) by mouth daily. 11/11/17 02/09/18  Arnoldo Lenis, MD    Family History Family History  Problem Relation Age of Onset  . Cancer Mother        breast  . Diabetes Mother   . Diabetes Sister   . Hypertension Sister   . Cancer Brother   . Mental illness Sister     Social History Social History   Tobacco Use  . Smoking status: Never Smoker  . Smokeless tobacco: Never Used  Substance Use Topics  . Alcohol use: No  . Drug use: No     Allergies   Patient has no known allergies.   Review of Systems Review of Systems  Cardiovascular: Positive  for chest pain.  All other systems reviewed and are negative.    Physical Exam Updated Vital Signs Ht 5\' 10"  (1.778 m)   Wt 94.8 kg   BMI 29.99 kg/m   Physical Exam Vitals signs and nursing note reviewed.  Constitutional:      General: He is not in acute distress.    Appearance: He is well-developed. He is not diaphoretic.  HENT:     Head: Normocephalic and atraumatic.  Neck:     Musculoskeletal: Normal range of motion and neck supple.  Cardiovascular:     Rate and Rhythm: Normal rate and regular rhythm.     Heart sounds: No murmur. No friction rub.  Pulmonary:     Effort: Pulmonary effort is normal. No respiratory distress.     Breath sounds: Normal breath sounds. No wheezing or rales.  Abdominal:     General: Bowel sounds are normal. There is no distension.     Palpations: Abdomen is soft.     Tenderness: There is no abdominal tenderness.  Musculoskeletal: Normal range of motion.     Right lower leg: He exhibits no tenderness. No edema.     Left lower leg: He exhibits no tenderness. No edema.  Skin:    General: Skin is warm and dry.  Neurological:     Mental Status: He is alert and oriented to person, place, and time.     Coordination: Coordination normal.      ED Treatments / Results  Labs (all labs ordered are listed, but only abnormal results are displayed) Labs Reviewed  BASIC METABOLIC PANEL  CBC WITH DIFFERENTIAL/PLATELET  TROPONIN I    EKG EKG Interpretation  Date/Time:  Friday June 24 2018 01:19:21 EDT Ventricular Rate:  48 PR Interval:    QRS Duration: 88 QT Interval:  425 QTC Calculation: 380 R Axis:   -13 Text Interpretation:  Sinus bradycardia Anteroseptal infarct, old Borderline T abnormalities, inferior leads Confirmed by Veryl Speak [phone]) on 06/24/2018 1:22:43 AM   Radiology No results found.  Procedures Procedures (including critical care time)  Medications Ordered in ED Medications - No data to display   Initial  Impression / Assessment and Plan / ED Course  I have reviewed the triage vital signs and the nursing notes.  Pertinent labs & imaging results that were available during my care of the patient were reviewed by me and considered in my medical decision making (see chart for details).  Patient presenting here with mild chest discomfort that started upon waking from sleep at 6 PM.  Symptoms are atypical for cardiac pain and worse when he tried to eat a slice of pizza.  The work-up thus far is unremarkable.  He has had a negative troponin and EKG is unchanged.  Chest x-ray is clear.  Upon reviewing his heart cath from January 2019, it was noted he had a 25% LAD lesion, however cath was otherwise clean.  I highly doubt an acute coronary event this evening and believe the patient is appropriate for discharge.  Final Clinical Impressions(s) / ED Diagnoses   Final diagnoses:  None    ED Discharge Orders    None       Veryl Speak, MD 06/24/18 2446

## 2018-06-24 NOTE — ED Notes (Signed)
Pt ambulatory to waiting room. Pt verbalized understanding of discharge instructions.   

## 2018-08-25 ENCOUNTER — Other Ambulatory Visit: Payer: Self-pay | Admitting: Cardiology

## 2018-08-25 MED FILL — METOPROLOL SUCCINATE ER 25: 25 | 90 days supply | Qty: 90 | Fill #0

## 2018-08-25 MED FILL — LISINOPRIL 2.5 MG TABLET: 2.5 | 90 days supply | Qty: 90 | Fill #0

## 2018-11-07 ENCOUNTER — Ambulatory Visit: Payer: 59 | Admitting: Cardiology

## 2018-11-07 ENCOUNTER — Other Ambulatory Visit: Payer: Self-pay

## 2018-11-07 ENCOUNTER — Encounter: Payer: Self-pay | Admitting: Cardiology

## 2018-11-07 VITALS — BP 117/72 | HR 67 | Temp 97.5°F | Ht 69.0 in | Wt 210.0 lb

## 2018-11-07 DIAGNOSIS — I2510 Atherosclerotic heart disease of native coronary artery without angina pectoris: Secondary | ICD-10-CM

## 2018-11-07 DIAGNOSIS — E782 Mixed hyperlipidemia: Secondary | ICD-10-CM | POA: Diagnosis not present

## 2018-11-07 DIAGNOSIS — I10 Essential (primary) hypertension: Secondary | ICD-10-CM | POA: Diagnosis not present

## 2018-11-07 NOTE — Progress Notes (Signed)
Clinical Summary Eugene [name] is a 63 y.o.male seen today for follow up of the following medical problems.  1. CAD - admit Jan 2019 with NSTEMI, peak trop 5.58 - cath without significant disease, questionable LAD plaque erosion.  - jan 2019 echo LVEF 55-60%   - no recent chest pain. No recent SOB/DOE - compliant with meds    2. HTN - compliant with meds  3. Hyperlipidemia - 07/2017 TC 159 HDL 50 TG 67 LDL 94 - after 07/2017 we changed him to crestor 40mg  daily to try to get LDL < 70 - has repeat labs coming up with pcp     SH: works at [employer] with the [employer]. He is a Press photographer   Past Medical History:  Diagnosis Date  . Back pain   . CAD (coronary artery disease)    a. 01/2017 NSTEMI/Cath: LM nl, LAD 25p - ? hypodense but no filling defect, RI nl, LCX nl, RCA nl, EF 55-65%-->Med Rx.  . Erectile dysfunction   . History of echocardiogram    a. 01/2017 Echo: EF 55-60%, no rwma.     No Known Allergies   Current Outpatient Medications  Medication Sig Dispense Refill  . aspirin 81 MG tablet Take 81 mg by mouth daily.      . clopidogrel (PLAVIX) 75 MG tablet Take 1 tablet (75 mg total) by mouth daily. 90 tablet 3  . lisinopril (ZESTRIL) 2.5 MG tablet TAKE 1 TABLET BY MOUTH DAILY. 90 tablet 1  . metoprolol succinate (TOPROL-XL) 25 MG 24 hr tablet TAKE 1 TABLET BY MOUTH DAILY. 90 tablet 1  . Multiple Vitamin (MULTIVITAMIN) tablet Take 1 tablet by mouth daily.      . nitroGLYCERIN (NITROSTAT) 0.4 MG SL tablet Place 1 tablet (0.4 mg total) under the tongue every 5 (five) minutes x 3 doses as needed for chest pain. 25 tablet 3  . rosuvastatin (CRESTOR) 40 MG tablet Take 1 tablet (40 mg total) by mouth daily. 90 tablet 3   No current facility-administered medications for this visit.      Past Surgical History:  Procedure Laterality Date  . LEFT HEART CATH AND CORONARY ANGIOGRAPHY N/A 01/15/2017   Procedure: LEFT HEART CATH AND CORONARY  ANGIOGRAPHY;  Surgeon: Martinique, Peter M, MD;  Location: [facility];  Service: Cardiovascular;  Laterality: N/A;     No Known Allergies    Family History  Problem Relation Age of Onset  . Cancer Mother        breast  . Diabetes Mother   . Diabetes Sister   . Hypertension Sister   . Cancer Brother   . Mental illness Sister      Social History Eugene [name] reports that he has never smoked. He has never used smokeless tobacco. Eugene [name] reports no history of alcohol use.   Review of Systems CONSTITUTIONAL: No weight loss, fever, chills, weakness or fatigue.  HEENT: Eyes: No visual loss, blurred vision, double vision or yellow sclerae.No hearing loss, sneezing, congestion, runny nose or sore throat.  SKIN: No rash or itching.  CARDIOVASCULAR: per hpi RESPIRATORY: No shortness of breath, cough or sputum.  GASTROINTESTINAL: No anorexia, nausea, vomiting or diarrhea. No abdominal pain or blood.  GENITOURINARY: No burning on urination, no polyuria NEUROLOGICAL: No headache, dizziness, syncope, paralysis, ataxia, numbness or tingling in the extremities. No change in bowel or bladder control.  MUSCULOSKELETAL: No muscle, back pain, joint pain or stiffness.  LYMPHATICS: No enlarged  nodes. No history of splenectomy.  PSYCHIATRIC: No history of depression or anxiety.  ENDOCRINOLOGIC: No reports of sweating, cold or heat intolerance. No polyuria or polydipsia.  Marland Kitchen   Physical Examination Today's Vitals   11/07/18 0847  BP: 117/72  Pulse: 67  Temp: (!) 97.5 F (36.4 C)  TempSrc: Temporal  SpO2: 97%  Weight: 210 lb (95.3 kg)  Height: 5\' 9"  (1.753 m)   Body mass index is 31.01 kg/m.  Gen: resting comfortably, no acute distress HEENT: no scleral icterus, pupils equal round and reactive, no palptable cervical adenopathy,  CV: RRR, no m/r/g, no jvd Resp: Clear to auscultation bilaterally GI: abdomen is soft, non-tender, non-distended, normal bowel sounds, no  hepatosplenomegaly MSK: extremities are warm, no edema.  Skin: warm, no rash Neuro:  no focal deficits Psych: appropriate affect   Diagnostic Studies  Jan 2019 echo Study Conclusions  - Left ventricle: The cavity size was normal. Wall thickness was normal. Systolic function was normal. The estimated ejection fraction was in the range of 55% to 60%. Wall motion was normal; there were no regional wall motion abnormalities. Left ventricular diastolic function parameters were normal. - Left atrium: The atrium was mildly dilated. - Pulmonary arteries: PA peak pressure: 33 mm Hg (S).  Jan 2019 cath  Prox LAD lesion is 25% stenosed.  The left ventricular systolic function is normal.  LV end diastolic pressure is normal.  The left ventricular ejection fraction is 55-65% by visual estimate.  1. No significant obstructive CAD. Initial views were concerning for hypodensity in the proximal LAD but there was poor filling due to poor catheter engagement. When a guide catheter was used with better filling this was less with no filling defect noted. 2. Normal LV function 3. Normal LVEDP  Plan: medical therapy. Cycle cardiac enzymes and Ecg. Check Echo.   Assessment and Plan  1. CAD - no recent symptoms, he is off plavix and we will remove from his list. Continue other meds  2. HTN - he is at goal, continue current meds  3. Hyperlipidemia - continue statin, f/u upcoming pcp labs   F/u 6 months      Arnoldo Lenis, M.D.

## 2018-11-07 NOTE — Patient Instructions (Signed)

## 2018-11-28 ENCOUNTER — Other Ambulatory Visit: Payer: Self-pay

## 2018-11-28 ENCOUNTER — Other Ambulatory Visit: Payer: 59

## 2018-11-28 DIAGNOSIS — Z0000 Encounter for general adult medical examination without abnormal findings: Secondary | ICD-10-CM

## 2018-11-29 LAB — CBC WITH DIFFERENTIAL/PLATELET
Absolute Monocytes: 595 cells/uL (ref 200–950)
Basophils Absolute: 19 cells/uL (ref 0–200)
Basophils Relative: 0.3 %
Eosinophils Absolute: 51 cells/uL (ref 15–500)
Eosinophils Relative: 0.8 %
HCT: 33.4 % — ABNORMAL LOW (ref 38.5–50.0)
Hemoglobin: 10.9 g/dL — ABNORMAL LOW (ref 13.2–17.1)
Lymphs Abs: 3526 cells/uL (ref 850–3900)
MCH: 31.6 pg (ref 27.0–33.0)
MCHC: 32.6 g/dL (ref 32.0–36.0)
MCV: 96.8 fL (ref 80.0–100.0)
MPV: 9.6 fL (ref 7.5–12.5)
Monocytes Relative: 9.3 %
Neutro Abs: 2208 cells/uL (ref 1500–7800)
Neutrophils Relative %: 34.5 %
Platelets: 252 10*3/uL (ref 140–400)
RBC: 3.45 10*6/uL — ABNORMAL LOW (ref 4.20–5.80)
RDW: 12.3 % (ref 11.0–15.0)
Total Lymphocyte: 55.1 %
WBC: 6.4 10*3/uL (ref 3.8–10.8)

## 2018-11-29 LAB — COMPREHENSIVE METABOLIC PANEL
AG Ratio: 0.7 (calc) — ABNORMAL LOW (ref 1.0–2.5)
ALT: 17 U/L (ref 9–46)
AST: 22 U/L (ref 10–35)
Albumin: 3.8 g/dL (ref 3.6–5.1)
Alkaline phosphatase (APISO): 58 U/L (ref 35–144)
BUN: 18 mg/dL (ref 7–25)
CO2: 28 mmol/L (ref 20–32)
Calcium: 9.1 mg/dL (ref 8.6–10.3)
Chloride: 101 mmol/L (ref 98–110)
Creat: 1.2 mg/dL (ref 0.70–1.25)
Globulin: 5.2 g/dL (calc) — ABNORMAL HIGH (ref 1.9–3.7)
Glucose, Bld: 85 mg/dL (ref 65–99)
Potassium: 4 mmol/L (ref 3.5–5.3)
Sodium: 135 mmol/L (ref 135–146)
Total Bilirubin: 0.7 mg/dL (ref 0.2–1.2)
Total Protein: 9 g/dL — ABNORMAL HIGH (ref 6.1–8.1)

## 2018-11-29 LAB — LIPID PANEL
Cholesterol: 127 mg/dL (ref <200)
HDL: 42 mg/dL (ref >=40)
LDL Cholesterol (Calc): 74 mg/dL (calc)
Non-HDL Cholesterol (Calc): 85 mg/dL (calc) (ref <130)
Total CHOL/HDL Ratio: 3 (calc) (ref <5.0)
Triglycerides: 37 mg/dL (ref <150)

## 2018-11-29 LAB — PSA: PSA: 0.4 ng/mL (ref <=4.0)

## 2018-12-01 MED FILL — METOPROLOL SUCCINATE ER 25: 25 | 90 days supply | Qty: 90 | Fill #1

## 2018-12-01 MED FILL — LISINOPRIL 2.5 MG TABLET: 2.5 | 90 days supply | Qty: 90 | Fill #1

## 2018-12-06 ENCOUNTER — Other Ambulatory Visit: Payer: Self-pay

## 2018-12-07 ENCOUNTER — Encounter: Payer: Self-pay | Admitting: Family Medicine

## 2018-12-07 ENCOUNTER — Ambulatory Visit (INDEPENDENT_AMBULATORY_CARE_PROVIDER_SITE_OTHER): Payer: 59 | Admitting: Family Medicine

## 2018-12-07 VITALS — BP 118/60 | HR 64 | Temp 97.9°F | Resp 12 | Ht 69.0 in | Wt 213.0 lb

## 2018-12-07 DIAGNOSIS — I2510 Atherosclerotic heart disease of native coronary artery without angina pectoris: Secondary | ICD-10-CM

## 2018-12-07 DIAGNOSIS — Z0000 Encounter for general adult medical examination without abnormal findings: Secondary | ICD-10-CM

## 2018-12-07 DIAGNOSIS — D472 Monoclonal gammopathy: Secondary | ICD-10-CM | POA: Diagnosis not present

## 2018-12-07 DIAGNOSIS — E6609 Other obesity due to excess calories: Secondary | ICD-10-CM | POA: Diagnosis not present

## 2018-12-07 DIAGNOSIS — Z0001 Encounter for general adult medical examination with abnormal findings: Secondary | ICD-10-CM

## 2018-12-07 DIAGNOSIS — Z6831 Body mass index (BMI) 31.0-31.9, adult: Secondary | ICD-10-CM

## 2018-12-07 MED ORDER — ROSUVASTATIN CALCIUM 40 MG PO TABS: 40.0000 mg | ORAL_TABLET | Freq: Every day | ORAL | 3 refills | Status: DC

## 2018-12-07 NOTE — Patient Instructions (Signed)
F/u 6 MONTHS  

## 2018-12-07 NOTE — Assessment & Plan Note (Signed)
CPE done.  We'll hold off on colonoscopy discussed next year.  Fasting labs look good.

## 2018-12-07 NOTE — Assessment & Plan Note (Addendum)
Discussed dietary changes also getting back to his exercise.

## 2018-12-07 NOTE — Assessment & Plan Note (Signed)
Reviewed his recent cardiology  visit his cholesterol is at goal as well as his blood pressure no change in medications.

## 2018-12-07 NOTE — Progress Notes (Signed)
   Subjective:    Patient ID: Eugene [name], male    DOB: 1955-10-25, 63 y.o.   MRN: [id number]  Patient presents for Annual Exam (is fasting)  Pt here for CPE, medications reviewed    CAD- he is ASA 81mg , no longer on plavix, he was recently seen by cardiology  HTN-he is taking his blood pressure medicine as prescribed no side effects with the medication Hyperlipidemia he is on statin drug   Monoclonal gammopathy- last seen in Oct  2019 by hematology, he never had bone marrow bipsy, discussed to have repeat labs in January of this year this has not been done. Reviewed his fasting labs at bedside including PSA screening which was negative  Due for colonoscopy wants to defer this at this time.  Immunizations flu shot flu shot is up-to-date will assist tetanus.  He declined shingles vaccine at this time.  Review Of Systems:  GEN- denies fatigue, fever, weight loss,weakness, recent illness HEENT- denies eye drainage, change in vision, nasal discharge, CVS- denies chest pain, palpitations RESP- denies SOB, cough, wheeze ABD- denies N/V, change in stools, abd pain GU- denies dysuria, hematuria, dribbling, incontinence MSK- denies joint pain, muscle aches, injury Neuro- denies headache, dizziness, syncope, seizure activity       Objective:    BP 118/60   Pulse 64   Temp 97.9 F (36.6 C) (Temporal)   Resp 12   Ht 5\' 9"  (1.753 m)   Wt 213 lb (96.6 kg)   SpO2 99%   BMI 31.45 kg/m  GEN- NAD, alert and oriented x3 HEENT- PERRL, EOMI, non injected sclera, pink conjunctiva, MMM, oropharynx clear Neck- Supple, no thyromegaly, no carotid bruit CVS- RRR, no murmur RESP-CTAB ABD-NABS,soft,NT,ND EXT- No edema Pulses- Radial, DP- 2+        Assessment & Plan:      Problem List Items Addressed This Visit      Unprioritized   CAD (coronary artery disease)    Reviewed his recent cardiology  visit his cholesterol is at goal as well as his blood pressure no change in  medications.      Gammopathy, monoclonal    I did recheck the labs that were intended to be done in January for hematology.  He is not symptomatic from the monoclonal gammopathy      Relevant Orders   Kappa/lambda light chains   Serum protein electrophoresis with reflex   Lactate Dehydrogenase   IgG, IgA, IgM   Obesity    Discussed dietary changes also getting back to his exercise.      Routine general medical examination at a health care facility - Primary    CPE done.  We'll hold off on colonoscopy discussed next year.  Fasting labs look good.         Note: This dictation was prepared with Dragon dictation along with smaller phrase technology. Any transcriptional errors that result from this process are unintentional.

## 2018-12-07 NOTE — Assessment & Plan Note (Signed)
I did recheck the labs that were intended to be done in January for hematology.  He is not symptomatic from the monoclonal gammopathy

## 2018-12-13 LAB — KAPPA/LAMBDA LIGHT CHAINS
Kappa free light chain: 96.9 mg/L — ABNORMAL HIGH (ref 3.3–19.4)
Kappa:Lambda Ratio: 8.81 — ABNORMAL HIGH (ref 0.26–1.65)
Lambda Free Lght Chn: 11 mg/L (ref 5.7–26.3)

## 2018-12-13 LAB — PROTEIN ELECTROPHORESIS, SERUM, WITH REFLEX
Abnormal Protein Band1: 3.2 g/dL — ABNORMAL HIGH
Albumin ELP: 4.1 g/dL (ref 3.8–4.8)
Alpha 1: 0.3 g/dL (ref 0.2–0.3)
Alpha 2: 0.7 g/dL (ref 0.5–0.9)
Beta 2: 0.3 g/dL (ref 0.2–0.5)
Beta Globulin: 0.4 g/dL (ref 0.4–0.6)
Gamma Globulin: 3.5 g/dL — ABNORMAL HIGH (ref 0.8–1.7)
Total Protein: 9.3 g/dL — ABNORMAL HIGH (ref 6.1–8.1)

## 2018-12-13 LAB — IFE INTERPRETATION: Immunofix Electr Int: DETECTED

## 2018-12-13 LAB — LACTATE DEHYDROGENASE: LDH: 138 U/L (ref 120–250)

## 2018-12-13 LAB — IGG, IGA, IGM
IgG (Immunoglobin G), Serum: 4376 mg/dL — ABNORMAL HIGH (ref 600–1540)
IgM, Serum: 18 mg/dL — ABNORMAL LOW (ref 50–300)
Immunoglobulin A: 115 mg/dL (ref 70–320)

## 2019-02-24 ENCOUNTER — Other Ambulatory Visit: Payer: Self-pay | Admitting: Cardiology

## 2019-02-24 MED FILL — LISINOPRIL 2.5 MG TABLET: 2.5 | 90 days supply | Qty: 90 | Fill #0

## 2019-02-24 MED FILL — METOPROLOL SUCCINATE ER 25: 25 | 90 days supply | Qty: 90 | Fill #0

## 2019-05-30 MED FILL — LISINOPRIL 2.5 MG TABLET: 2.5 | 90 days supply | Qty: 90 | Fill #1

## 2019-05-30 MED FILL — METOPROLOL SUCCINATE ER 25: 25 | 90 days supply | Qty: 90 | Fill #1

## 2019-06-02 ENCOUNTER — Other Ambulatory Visit: Payer: Self-pay

## 2019-06-02 ENCOUNTER — Other Ambulatory Visit: Payer: 59

## 2019-06-02 DIAGNOSIS — I2510 Atherosclerotic heart disease of native coronary artery without angina pectoris: Secondary | ICD-10-CM | POA: Diagnosis not present

## 2019-06-03 LAB — CBC WITH DIFFERENTIAL/PLATELET
Absolute Monocytes: 631 cells/uL (ref 200–950)
Basophils Absolute: 12 cells/uL (ref 0–200)
Basophils Relative: 0.2 %
Eosinophils Absolute: 53 cells/uL (ref 15–500)
Eosinophils Relative: 0.9 %
HCT: 36.6 % — ABNORMAL LOW (ref 38.5–50.0)
Hemoglobin: 12.3 g/dL — ABNORMAL LOW (ref 13.2–17.1)
Lymphs Abs: 3511 cells/uL (ref 850–3900)
MCH: 33 pg (ref 27.0–33.0)
MCHC: 33.6 g/dL (ref 32.0–36.0)
MCV: 98.1 fL (ref 80.0–100.0)
MPV: 9.9 fL (ref 7.5–12.5)
Monocytes Relative: 10.7 %
Neutro Abs: 1693 cells/uL (ref 1500–7800)
Neutrophils Relative %: 28.7 %
Platelets: 259 10*3/uL (ref 140–400)
RBC: 3.73 10*6/uL — ABNORMAL LOW (ref 4.20–5.80)
RDW: 12.5 % (ref 11.0–15.0)
Total Lymphocyte: 59.5 %
WBC: 5.9 10*3/uL (ref 3.8–10.8)

## 2019-06-03 LAB — LIPID PANEL
Cholesterol: 150 mg/dL (ref <200)
HDL: 46 mg/dL (ref >=40)
LDL Cholesterol (Calc): 91 mg/dL (calc)
Non-HDL Cholesterol (Calc): 104 mg/dL (calc) (ref <130)
Total CHOL/HDL Ratio: 3.3 (calc) (ref <5.0)
Triglycerides: 51 mg/dL (ref <150)

## 2019-06-03 LAB — COMPREHENSIVE METABOLIC PANEL
AG Ratio: 0.8 (calc) — ABNORMAL LOW (ref 1.0–2.5)
ALT: 19 U/L (ref 9–46)
AST: 20 U/L (ref 10–35)
Albumin: 4 g/dL (ref 3.6–5.1)
Alkaline phosphatase (APISO): 62 U/L (ref 35–144)
BUN: 19 mg/dL (ref 7–25)
CO2: 25 mmol/L (ref 20–32)
Calcium: 9.2 mg/dL (ref 8.6–10.3)
Chloride: 102 mmol/L (ref 98–110)
Creat: 1.09 mg/dL (ref 0.70–1.25)
Globulin: 5 g/dL (calc) — ABNORMAL HIGH (ref 1.9–3.7)
Glucose, Bld: 90 mg/dL (ref 65–99)
Potassium: 4.3 mmol/L (ref 3.5–5.3)
Sodium: 134 mmol/L — ABNORMAL LOW (ref 135–146)
Total Bilirubin: 0.6 mg/dL (ref 0.2–1.2)
Total Protein: 9 g/dL — ABNORMAL HIGH (ref 6.1–8.1)

## 2019-06-06 ENCOUNTER — Ambulatory Visit: Payer: 59 | Admitting: Family Medicine

## 2019-06-06 ENCOUNTER — Encounter: Payer: Self-pay | Admitting: Family Medicine

## 2019-06-06 ENCOUNTER — Other Ambulatory Visit: Payer: Self-pay

## 2019-06-06 VITALS — BP 112/74 | HR 80 | Temp 97.9°F | Resp 14 | Ht 69.0 in | Wt 214.0 lb

## 2019-06-06 DIAGNOSIS — I2510 Atherosclerotic heart disease of native coronary artery without angina pectoris: Secondary | ICD-10-CM | POA: Diagnosis not present

## 2019-06-06 DIAGNOSIS — M25561 Pain in right knee: Secondary | ICD-10-CM | POA: Diagnosis not present

## 2019-06-06 DIAGNOSIS — D472 Monoclonal gammopathy: Secondary | ICD-10-CM | POA: Diagnosis not present

## 2019-06-06 DIAGNOSIS — M25461 Effusion, right knee: Secondary | ICD-10-CM | POA: Diagnosis not present

## 2019-06-06 DIAGNOSIS — Z1211 Encounter for screening for malignant neoplasm of colon: Secondary | ICD-10-CM | POA: Diagnosis not present

## 2019-06-06 MED ORDER — MELOXICAM 15 MG PO TABS: 15.0000 mg | ORAL_TABLET | Freq: Every day | ORAL | 0 refills | Status: DC

## 2019-06-06 NOTE — Assessment & Plan Note (Signed)
Controlled.  Not had any recurrent symptoms.  His LDL was mildly elevated at 91 goal is to keep this closer to 70.  We will continue him on Crestor.  He does admit to some dietary indiscretions and likely brace his LDL in the past few months.

## 2019-06-06 NOTE — Patient Instructions (Addendum)
F/U 6 months for Physical  Referral to orthopedics Take mobic once a day with food

## 2019-06-06 NOTE — Assessment & Plan Note (Signed)
Symptomatic protein levels are stable.  Hemoglobin has improved.  No current treatment.

## 2019-06-06 NOTE — Progress Notes (Signed)
   Subjective:    Patient ID: Eugene [name], male    DOB: [birth year], 64 y.o.   MRN: [id number]  Patient presents for Follow-up (is fasting) and R Knee Pain/ Edema (x1 month- swelling to inner side of knee with pain- using Icy Hot with some relief)   Pt here to f/u chronic medical problems  medications reviewed    He was followed by hematology. 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, has swelling, used ICE, topical  he has pain with bending and extending, going up steps has pain   No pain into ankle and foot   Review of labs Hb has improved      TYpically exercies witout any chest pain or difficulty   Discussed COVID-19 vaccine   Review Of Systems:  GEN- denies fatigue, fever, weight loss,weakness, recent illness HEENT- denies eye drainage, change in vision, nasal discharge, CVS- denies chest pain, palpitations RESP- denies SOB, cough, wheeze ABD- denies N/V, change in stools, abd pain GU- denies dysuria, hematuria, dribbling, incontinence MSK- + joint pain, muscle aches, injury Neuro- denies headache, dizziness, syncope, seizure activity       Objective:    BP 112/74   Pulse 80   Temp 97.9 F (36.6 C) (Temporal)   Resp 14   Ht 5\' 9"  (1.753 m)   Wt 214 lb (97.1 kg)   SpO2 96%   BMI 31.60 kg/m  GEN- NAD, alert and oriented x3 HEENT- PERRL, EOMI, non injected sclera, pink conjunctiva, MMM, oropharynx clear Neck- Supple, CVS- RRR, no murmur RESP-CTAB ABD-NABS,soft,NT,ND MSK- Left knee normal inspection, FROM, Right knee- small effusion medially, TTP medial aspect of knee, +crepitus, ligaments grossly in tact, mild antalgic gait  EXT- No edema Pulses- Radial  2+        Assessment & Plan:      Problem List Items  Addressed This Visit      Unprioritized   CAD (coronary artery disease) - Primary    Controlled.  Not had any recurrent symptoms.  His LDL was mildly elevated at 91 goal is to keep this closer to 70.  We will continue him on Crestor.  He does admit to some dietary indiscretions and likely brace his LDL in the past few months.      Gammopathy, monoclonal    Symptomatic protein levels are stable.  Hemoglobin has improved.  No current treatment.       Other Visit Diagnoses    Acute pain of right knee       Right knee pain, unknown injury with effusion, referral to ortho, start mobic for inflammation   Relevant Orders   Ambulatory referral to Orthopedic Surgery   Colon cancer screening       Relevant Orders   Ambulatory referral to Gastroenterology   Effusion of bursa of right knee       Relevant Orders   Ambulatory referral to Orthopedic Surgery      Note: This dictation was prepared with Dragon dictation along with smaller phrase technology. Any transcriptional errors that result from this process are unintentional.

## 2019-06-07 MED FILL — MELOXICAM 15 MG TABLET: 15 | 30 days supply | Qty: 30 | Fill #0

## 2019-06-08 ENCOUNTER — Telehealth: Payer: Self-pay | Admitting: Orthopedic Surgery

## 2019-06-08 NOTE — Telephone Encounter (Signed)
Patient returned call; scheduled according to referral.

## 2019-06-13 ENCOUNTER — Ambulatory Visit: Payer: 59

## 2019-06-13 ENCOUNTER — Encounter: Payer: Self-pay | Admitting: Orthopaedic Surgery

## 2019-06-13 ENCOUNTER — Other Ambulatory Visit: Payer: Self-pay

## 2019-06-13 ENCOUNTER — Ambulatory Visit: Payer: 59 | Admitting: Orthopaedic Surgery

## 2019-06-13 VITALS — BP 131/78 | HR 62 | Ht 69.0 in | Wt 216.0 lb

## 2019-06-13 DIAGNOSIS — M25561 Pain in right knee: Secondary | ICD-10-CM

## 2019-06-13 DIAGNOSIS — G8929 Other chronic pain: Secondary | ICD-10-CM | POA: Diagnosis not present

## 2019-06-13 NOTE — Progress Notes (Signed)
Subjective:    Patient ID: Eugene [name], male    DOB: 1955-01-28, 64 y.o.   MRN: [id number]  HPI He has right knee pain that has gotten worse over the last four to six weeks.  He has no trauma.  He has swelling and medial joint line pain.  He has no giving way.  He has tried ice and Advil with little help.  He was seen by Dr. Buelah Manis recently and I have reviewed the notes.     Review of Systems  Constitutional: Positive for activity change.  Musculoskeletal: Positive for arthralgias, back pain, gait problem and joint swelling.  All other systems reviewed and are negative.  For Review of Systems, all other systems reviewed and are negative.  The following is a summary of the past history medically, past history surgically, known current medicines, social history and family history.  This information is gathered electronically by the computer from prior information and documentation.  I review this each visit and have found including this information at this point in the chart is beneficial and informative.   Past Medical History:  Diagnosis Date  . Back pain   . CAD (coronary artery disease)    a. 01/2017 NSTEMI/Cath: LM nl, LAD 25p - ? hypodense but no filling defect, RI nl, LCX nl, RCA nl, EF 55-65%-->Med Rx.  . Erectile dysfunction   . History of echocardiogram    a. 01/2017 Echo: EF 55-60%, no rwma.    Past Surgical History:  Procedure Laterality Date  . LEFT HEART CATH AND CORONARY ANGIOGRAPHY N/A 01/15/2017   Procedure: LEFT HEART CATH AND CORONARY ANGIOGRAPHY;  Surgeon: Martinique, Peter M, MD;  Location: [facility];  Service: Cardiovascular;  Laterality: N/A;    Current Outpatient Medications on File Prior to Visit  Medication Sig Dispense Refill  . aspirin 81 MG tablet Take 81 mg by mouth daily.      Marland Kitchen lisinopril (ZESTRIL) 2.5 MG tablet TAKE 1 TABLET BY MOUTH DAILY. 90 tablet 3  . meloxicam (MOBIC) 15 MG tablet Take 1 tablet (15 mg total) by mouth daily. 30 tablet 0  .  metoprolol succinate (TOPROL-XL) 25 MG 24 hr tablet TAKE 1 TABLET BY MOUTH DAILY. 90 tablet 3  . Multiple Vitamin (MULTIVITAMIN) tablet Take 1 tablet by mouth daily.      . nitroGLYCERIN (NITROSTAT) 0.4 MG SL tablet Place 1 tablet (0.4 mg total) under the tongue every 5 (five) minutes x 3 doses as needed for chest pain. 25 tablet 3  . rosuvastatin (CRESTOR) 40 MG tablet Take 1 tablet (40 mg total) by mouth daily. 90 tablet 3   No current facility-administered medications on file prior to visit.    Social History   Socioeconomic History  . Marital status: Divorced    Spouse name: Not on file  . Number of children: Not on file  . Years of education: Not on file  . Highest education level: Not on file  Occupational History  . Not on file  Tobacco Use  . Smoking status: Never Smoker  . Smokeless tobacco: Never Used  Substance and Sexual Activity  . Alcohol use: No  . Drug use: No  . Sexual activity: Yes  Other Topics Concern  . Not on file  Social History Narrative  . Not on file   Social Determinants of Health   Financial Resource Strain:   . Difficulty of Paying Living Expenses:   Food Insecurity:   . Worried About Estate manager/land agent  of Food in the Last Year:   . [place] in the Last Year:   Transportation Needs:   . Lack of Transportation (Medical):   Marland Kitchen Lack of Transportation (Non-Medical):   Physical Activity:   . Days of Exercise per Week:   . Minutes of Exercise per Session:   Stress:   . Feeling of Stress :   Social Connections:   . Frequency of Communication with Friends and Family:   . Frequency of Social Gatherings with Friends and Family:   . Attends Religious Services:   . Active Member of Clubs or Organizations:   . Attends Archivist Meetings:   Marland Kitchen Marital Status:   Intimate Partner Violence:   . Fear of Current or Ex-Partner:   . Emotionally Abused:   Marland Kitchen Physically Abused:   . Sexually Abused:     Family History  Problem Relation Age of  Onset  . Cancer Mother        breast  . Diabetes Mother   . Diabetes Sister   . Hypertension Sister   . Cancer Brother   . Mental illness Sister     BP 131/78   Pulse 62   Ht 5\' 9"  (1.753 m)   Wt 216 lb (98 kg)   BMI 31.90 kg/m   Body mass index is 31.9 kg/m.      Objective:   Physical Exam Vitals and nursing note reviewed.  Constitutional:      Appearance: He is well-developed.  HENT:     Head: Normocephalic and atraumatic.  Eyes:     Conjunctiva/sclera: Conjunctivae normal.     Pupils: Pupils are equal, round, and reactive to light.  Cardiovascular:     Rate and Rhythm: Normal rate and regular rhythm.  Pulmonary:     Effort: Pulmonary effort is normal.  Abdominal:     Palpations: Abdomen is soft.  Musculoskeletal:     Cervical back: Normal range of motion and neck supple.       Legs:  Skin:    General: Skin is warm and dry.  Neurological:     Mental Status: He is alert and oriented to person, place, and time.     Cranial Nerves: No cranial nerve deficit.     Motor: No abnormal muscle tone.     Coordination: Coordination normal.     Deep Tendon Reflexes: Reflexes are normal and symmetric. Reflexes normal.  Psychiatric:        Behavior: Behavior normal.        Thought Content: Thought content normal.        Judgment: Judgment normal.     X-rays were done of the right knee, reported separately.      Assessment & Plan:   Encounter Diagnosis  Name Primary?  . Chronic pain of right knee Yes   PROCEDURE NOTE:  The patient requests injections of the right knee , verbal consent was obtained.  The right knee was prepped appropriately after time out was performed.   Sterile technique was observed and injection of 1 cc of Depo-Medrol 40 mg with several cc's of plain xylocaine. Anesthesia was provided by ethyl chloride and a 20-gauge needle was used to inject the knee area. The injection was tolerated well.  A band aid dressing was applied.  The patient  was advised to apply ice later today and tomorrow to the injection sight as needed.  He has been on Mobic, just started last Friday, and I told him  to continue this.  Return in three weeks.  Call if any problem.  Precautions discussed.   He may need MRI.  Electronically Signed Sanjuana Kava, MD 6/1/202111:29 AM

## 2019-06-14 ENCOUNTER — Ambulatory Visit (INDEPENDENT_AMBULATORY_CARE_PROVIDER_SITE_OTHER): Payer: 59 | Admitting: Cardiology

## 2019-06-14 ENCOUNTER — Encounter: Payer: Self-pay | Admitting: Cardiology

## 2019-06-14 ENCOUNTER — Encounter: Payer: Self-pay | Admitting: Internal Medicine

## 2019-06-14 VITALS — BP 124/68 | HR 60 | Ht 69.0 in | Wt 216.0 lb

## 2019-06-14 DIAGNOSIS — E782 Mixed hyperlipidemia: Secondary | ICD-10-CM | POA: Diagnosis not present

## 2019-06-14 DIAGNOSIS — I10 Essential (primary) hypertension: Secondary | ICD-10-CM | POA: Diagnosis not present

## 2019-06-14 DIAGNOSIS — I2510 Atherosclerotic heart disease of native coronary artery without angina pectoris: Secondary | ICD-10-CM | POA: Diagnosis not present

## 2019-06-14 NOTE — Progress Notes (Signed)
Clinical Summary Mr. Brinkworth is a 64 y.o.male seen today for follow up of the following medical problems.  1. CAD - admit Jan 2019 with NSTEMI, peak trop 5.58 - cath without significant disease, questionable LAD plaque erosion.  - jan 2019 echo LVEF 55-60%   - no recent chest pain. No SOB/DOE - compliant withmeds    2. HTN - he is compliant with meds  3. Hyperlipidemia 05/2019 TC 150 HDL 46 TG 51 LDL 91 - admits dietary indiscretion, compliant crestor 40mg .    SH: works at [employer] with the [employer]. He is a Press photographer  Has not had covid vaccine.    Past Medical History:  Diagnosis Date  . Back pain   . CAD (coronary artery disease)    a. 01/2017 NSTEMI/Cath: LM nl, LAD 25p - ? hypodense but no filling defect, RI nl, LCX nl, RCA nl, EF 55-65%-->Med Rx.  . Erectile dysfunction   . History of echocardiogram    a. 01/2017 Echo: EF 55-60%, no rwma.     No Known Allergies   Current Outpatient Medications  Medication Sig Dispense Refill  . aspirin 81 MG tablet Take 81 mg by mouth daily.      Marland Kitchen lisinopril (ZESTRIL) 2.5 MG tablet TAKE 1 TABLET BY MOUTH DAILY. 90 tablet 3  . meloxicam (MOBIC) 15 MG tablet Take 1 tablet (15 mg total) by mouth daily. 30 tablet 0  . metoprolol succinate (TOPROL-XL) 25 MG 24 hr tablet TAKE 1 TABLET BY MOUTH DAILY. 90 tablet 3  . Multiple Vitamin (MULTIVITAMIN) tablet Take 1 tablet by mouth daily.      . nitroGLYCERIN (NITROSTAT) 0.4 MG SL tablet Place 1 tablet (0.4 mg total) under the tongue every 5 (five) minutes x 3 doses as needed for chest pain. 25 tablet 3  . rosuvastatin (CRESTOR) 40 MG tablet Take 1 tablet (40 mg total) by mouth daily. 90 tablet 3   No current facility-administered medications for this visit.     Past Surgical History:  Procedure Laterality Date  . LEFT HEART CATH AND CORONARY ANGIOGRAPHY N/A 01/15/2017   Procedure: LEFT HEART CATH AND CORONARY ANGIOGRAPHY;  Surgeon: Martinique, Peter M,  MD;  Location: [facility];  Service: Cardiovascular;  Laterality: N/A;     No Known Allergies    Family History  Problem Relation Age of Onset  . Cancer Mother        breast  . Diabetes Mother   . Diabetes Sister   . Hypertension Sister   . Cancer Brother   . Mental illness Sister      Social History Mr. Gruenwald reports that he has never smoked. He has never used smokeless tobacco. Mr. Snyder reports no history of alcohol use.   Review of Systems CONSTITUTIONAL: No weight loss, fever, chills, weakness or fatigue.  HEENT: Eyes: No visual loss, blurred vision, double vision or yellow sclerae.No hearing loss, sneezing, congestion, runny nose or sore throat.  SKIN: No rash or itching.  CARDIOVASCULAR: per hpi RESPIRATORY: No shortness of breath, cough or sputum.  GASTROINTESTINAL: No anorexia, nausea, vomiting or diarrhea. No abdominal pain or blood.  GENITOURINARY: No burning on urination, no polyuria NEUROLOGICAL: No headache, dizziness, syncope, paralysis, ataxia, numbness or tingling in the extremities. No change in bowel or bladder control.  MUSCULOSKELETAL: No muscle, back pain, joint pain or stiffness.  LYMPHATICS: No enlarged nodes. No history of splenectomy.  PSYCHIATRIC: No history of depression or anxiety.  ENDOCRINOLOGIC: No reports of sweating, cold or heat intolerance. No polyuria or polydipsia.  Marland Kitchen   Physical Examination Today's Vitals   06/14/19 0828  BP: 124/68  Pulse: 60  SpO2: 98%  Weight: 216 lb (98 kg)  Height: 5\' 9"  (1.753 m)   Body mass index is 31.9 kg/m.  Gen: resting comfortably, no acute distress HEENT: no scleral icterus, pupils equal round and reactive, no palptable cervical adenopathy,  CV: RRR, no mrg, no jvd Resp: Clear to auscultation bilaterally GI: abdomen is soft, non-tender, non-distended, normal bowel sounds, no hepatosplenomegaly MSK: extremities are warm, no edema.  Skin: warm, no rash Neuro:  no focal  deficits Psych: appropriate affect   Diagnostic Studies  Jan 2019 echo Study Conclusions  - Left ventricle: The cavity size was normal. Wall thickness was normal. Systolic function was normal. The estimated ejection fraction was in the range of 55% to 60%. Wall motion was normal; there were no regional wall motion abnormalities. Left ventricular diastolic function parameters were normal. - Left atrium: The atrium was mildly dilated. - Pulmonary arteries: PA peak pressure: 33 mm Hg (S).  Jan 2019 cath  Prox LAD lesion is 25% stenosed.  The left ventricular systolic function is normal.  LV end diastolic pressure is normal.  The left ventricular ejection fraction is 55-65% by visual estimate.  1. No significant obstructive CAD. Initial views were concerning for hypodensity in the proximal LAD but there was poor filling due to poor catheter engagement. When a guide catheter was used with better filling this was less with no filling defect noted. 2. Normal LV function 3. Normal LVEDP  Plan: medical therapy. Cycle cardiac enzymes and Ecg. Check Echo.   Assessment and Plan  1. CAD -no symptoms, continue current meds  2. HTN - at goal, continue current meds  3. Hyperlipidemia -continue statin. Working on dietary modification, if LDL not <70 at next check would add zetia 10mg  to his crestor 40mg  daily.    F/u 1 year      Arnoldo Lenis, M.D.

## 2019-06-14 NOTE — Patient Instructions (Signed)
Medication Instructions:   Your physician recommends that you continue on your current medications as directed. Please refer to the Current Medication list given to you today.  *If you need a refill on your cardiac medications before your next appointment, please call your pharmacy*   Lab Work: None today  If you have labs (blood work) drawn today and your tests are completely normal, you will receive your results only by: . MyChart Message (if you have MyChart) OR . A paper copy in the mail If you have any lab test that is abnormal or we need to change your treatment, we will call you to review the results.   Testing/Procedures: None today   Follow-Up: At [facility], you and your health needs are our priority.  As part of our continuing mission to provide you with exceptional heart care, we have created designated Provider Care Teams.  These Care Teams include your primary Cardiologist (physician) and Advanced Practice Providers (APPs -  Physician Assistants and Nurse Practitioners) who all work together to provide you with the care you need, when you need it.  We recommend signing up for the patient portal called "MyChart".  Sign up information is provided on this After Visit Summary.  MyChart is used to connect with patients for Virtual Visits (Telemedicine).  Patients are able to view lab/test results, encounter notes, upcoming appointments, etc.  Non-urgent messages can be sent to your provider as well.   To learn more about what you can do with MyChart, go to [email or web address].    Your next appointment:   12 month(s)  The format for your next appointment:   In Person  Provider:   Jonathan Branch, MD   Other Instructions None      Thank you for choosing [facility] !         

## 2019-07-04 ENCOUNTER — Encounter: Payer: Self-pay | Admitting: Orthopaedic Surgery

## 2019-07-04 ENCOUNTER — Ambulatory Visit: Payer: 59 | Admitting: Orthopaedic Surgery

## 2019-07-04 ENCOUNTER — Other Ambulatory Visit: Payer: Self-pay

## 2019-07-04 VITALS — Ht 69.0 in | Wt 215.0 lb

## 2019-07-04 DIAGNOSIS — M25561 Pain in right knee: Secondary | ICD-10-CM

## 2019-07-04 DIAGNOSIS — G8929 Other chronic pain: Secondary | ICD-10-CM

## 2019-07-04 MED ORDER — MELOXICAM 15 MG PO TABS: 15.0000 mg | ORAL_TABLET | Freq: Every day | ORAL | 3 refills | Status: DC

## 2019-07-04 MED FILL — MELOXICAM 15 MG TABLET: 15 | 30 days supply | Qty: 30 | Fill #0

## 2019-07-04 NOTE — Progress Notes (Signed)
Patient Eugene [name], male DOB:1955/08/17, 64 y.o. NIO:[id number]  Chief Complaint  Patient presents with   Knee Pain    Rt knee    HPI  Eugene [name] is a 64 y.o. male who has pain of the right knee. He is better.  He still has some pain but not as much as before.  He is not limping today.  He is taking the Mobic with no problem.  Continue the Mobic.  He has no giving way of the right knee.   Body mass index is 31.75 kg/m.  ROS  Review of Systems  Constitutional: Positive for activity change.  Musculoskeletal: Positive for arthralgias, back pain, gait problem and joint swelling.  All other systems reviewed and are negative.   All other systems reviewed and are negative.  The following is a summary of the past history medically, past history surgically, known current medicines, social history and family history.  This information is gathered electronically by the computer from prior information and documentation.  I review this each visit and have found including this information at this point in the chart is beneficial and informative.    Past Medical History:  Diagnosis Date   Back pain    CAD (coronary artery disease)    a. 01/2017 NSTEMI/Cath: LM nl, LAD 25p - ? hypodense but no filling defect, RI nl, LCX nl, RCA nl, EF 55-65%-->Med Rx.   Erectile dysfunction    History of echocardiogram    a. 01/2017 Echo: EF 55-60%, no rwma.    Past Surgical History:  Procedure Laterality Date   LEFT HEART CATH AND CORONARY ANGIOGRAPHY N/A 01/15/2017   Procedure: LEFT HEART CATH AND CORONARY ANGIOGRAPHY;  Surgeon: Martinique, Peter M, MD;  Location: [place] [facility];  Service: Cardiovascular;  Laterality: N/A;    Family History  Problem Relation Age of Onset   Cancer Mother        breast   Diabetes Mother    Diabetes Sister    Hypertension Sister    Cancer Brother    Mental illness Sister     Social History Social History   Tobacco Use   Smoking status: Never  Smoker   Smokeless tobacco: Never Used  Scientific laboratory technician Use: Never used  Substance Use Topics   Alcohol use: No   Drug use: No    No Known Allergies  Current Outpatient Medications  Medication Sig Dispense Refill   aspirin 81 MG tablet Take 81 mg by mouth daily.       lisinopril (ZESTRIL) 2.5 MG tablet TAKE 1 TABLET BY MOUTH DAILY. 90 tablet 3   meloxicam (MOBIC) 15 MG tablet Take 1 tablet (15 mg total) by mouth daily. 30 tablet 3   metoprolol succinate (TOPROL-XL) 25 MG 24 hr tablet TAKE 1 TABLET BY MOUTH DAILY. 90 tablet 3   Multiple Vitamin (MULTIVITAMIN) tablet Take 1 tablet by mouth daily.       nitroGLYCERIN (NITROSTAT) 0.4 MG SL tablet Place 1 tablet (0.4 mg total) under the tongue every 5 (five) minutes x 3 doses as needed for chest pain. 25 tablet 3   rosuvastatin (CRESTOR) 40 MG tablet Take 1 tablet (40 mg total) by mouth daily. 90 tablet 3   No current facility-administered medications for this visit.     Physical Exam  Height 5\' 9"  (1.753 m), weight 215 lb (97.5 kg).  Constitutional: overall normal hygiene, normal nutrition, well developed, normal grooming, normal body habitus. Assistive device:none  Musculoskeletal: gait  and station Limp none, muscle tone and strength are normal, no tremors or atrophy is present.  .  Neurological: coordination overall normal.  Deep tendon reflex/nerve stretch intact.  Sensation normal.  Cranial nerves II-XII intact.   Skin:   Normal overall no scars, lesions, ulcers or rashes. No psoriasis.  Psychiatric: Alert and oriented x 3.  Recent memory intact, remote memory unclear.  Normal mood and affect. Well groomed.  Good eye contact.  Cardiovascular: overall no swelling, no varicosities, no edema bilaterally, normal temperatures of the legs and arms, no clubbing, cyanosis and good capillary refill.  Lymphatic: palpation is normal.  Right knee with ROM 0 to 115, no limp, slight effusion, slight crepitus, NV  intact.  All other systems reviewed and are negative   The patient has been educated about the nature of the problem(s) and counseled on treatment options.  The patient appeared to understand what I have discussed and is in agreement with it.  Encounter Diagnosis  Name Primary?   Chronic pain of right knee Yes    PLAN Call if any problems.  Precautions discussed.  Continue current medications.   Return to clinic as needed.   I did refill his Mobic today.  Electronically Signed Sanjuana Kava, MD 6/22/202110:26 AM

## 2019-07-20 IMAGING — DX CHEST - 2 VIEW
2 series · 2 of 2 positions shown · non-contrast
Comparison: Chest radiographs 01/15/2017. skeletal survey
10/22/2017.

CLINICAL DATA: 63-year-old male with mid chest pain since 8611
hours.

EXAM:
CHEST - 2 VIEW

[chest pa]
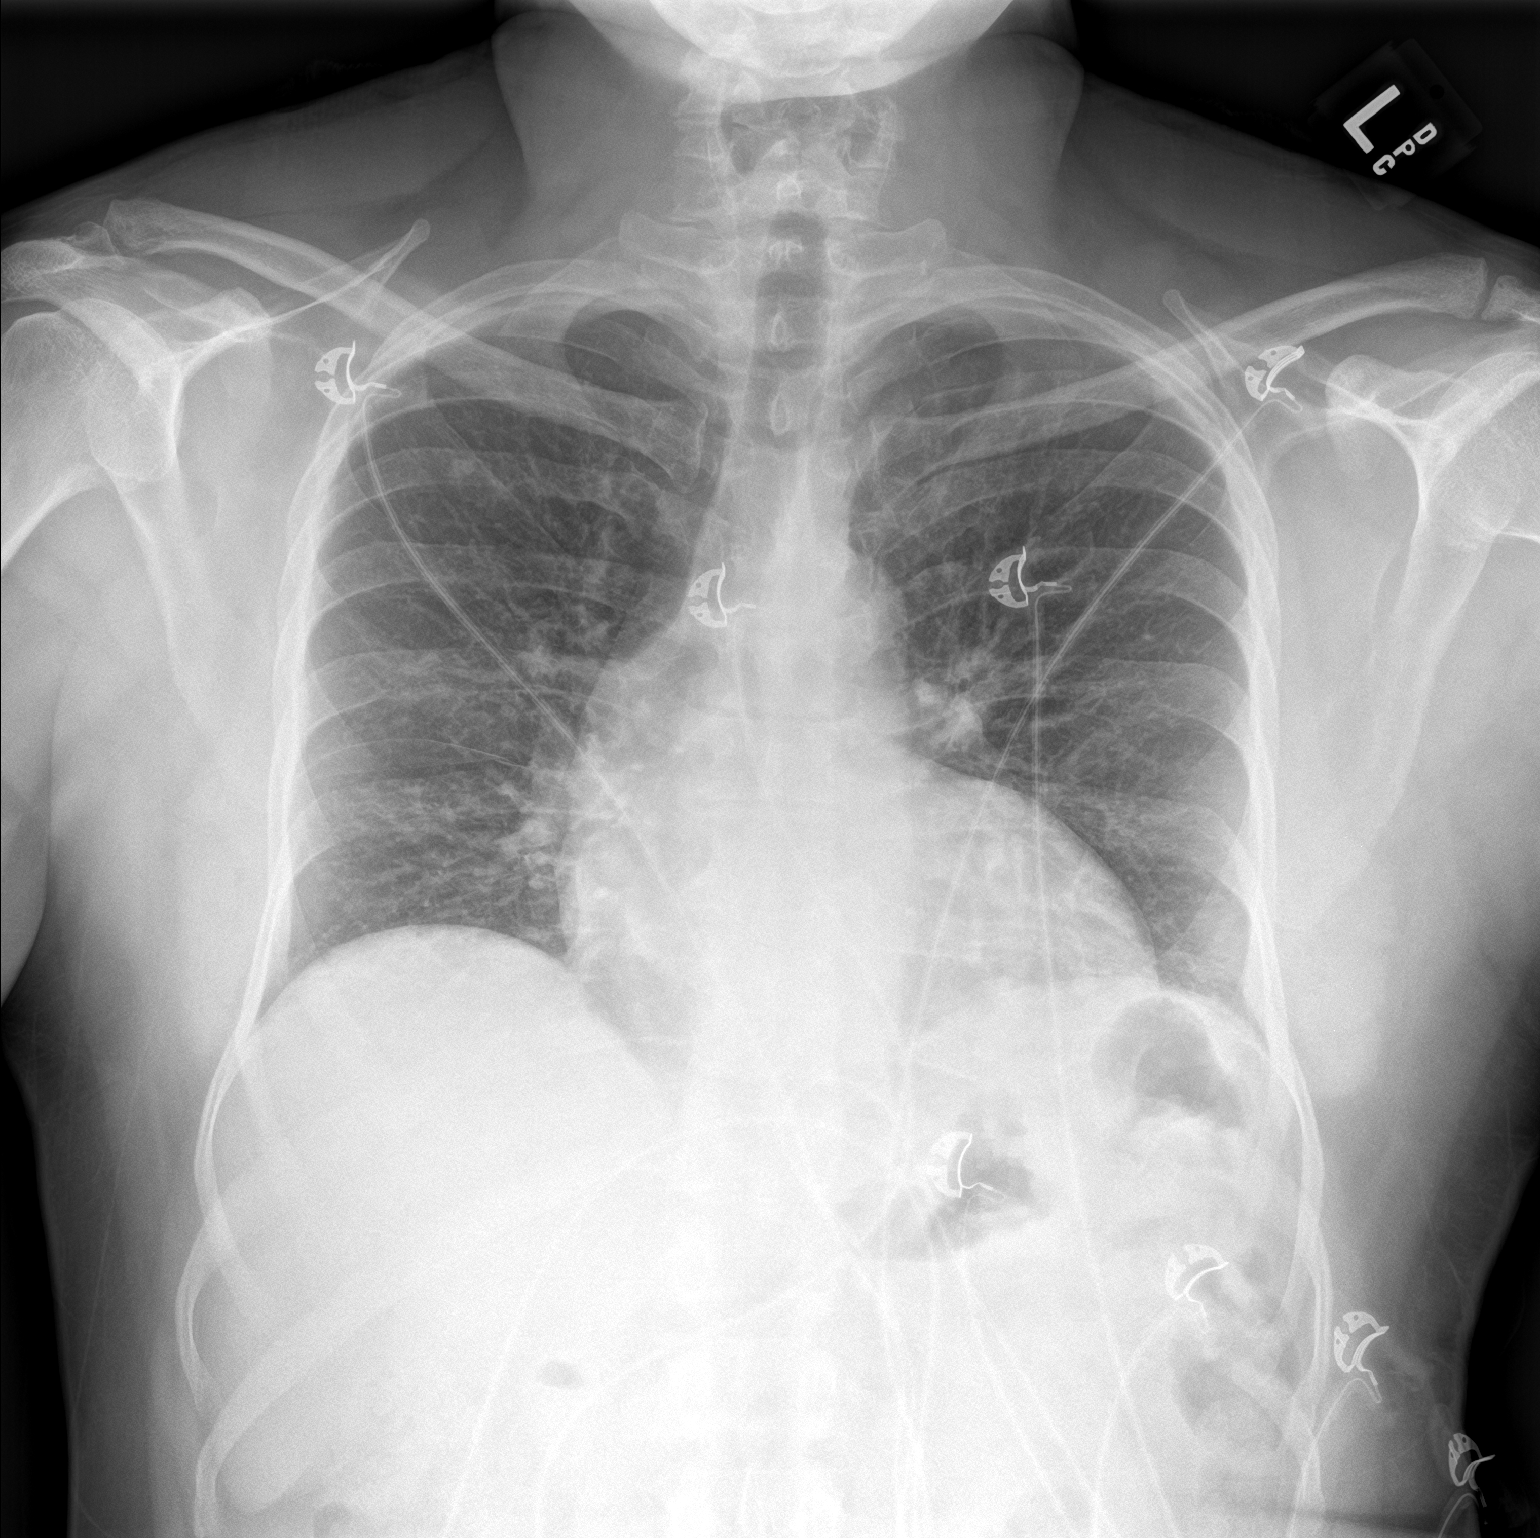

[chest lat]
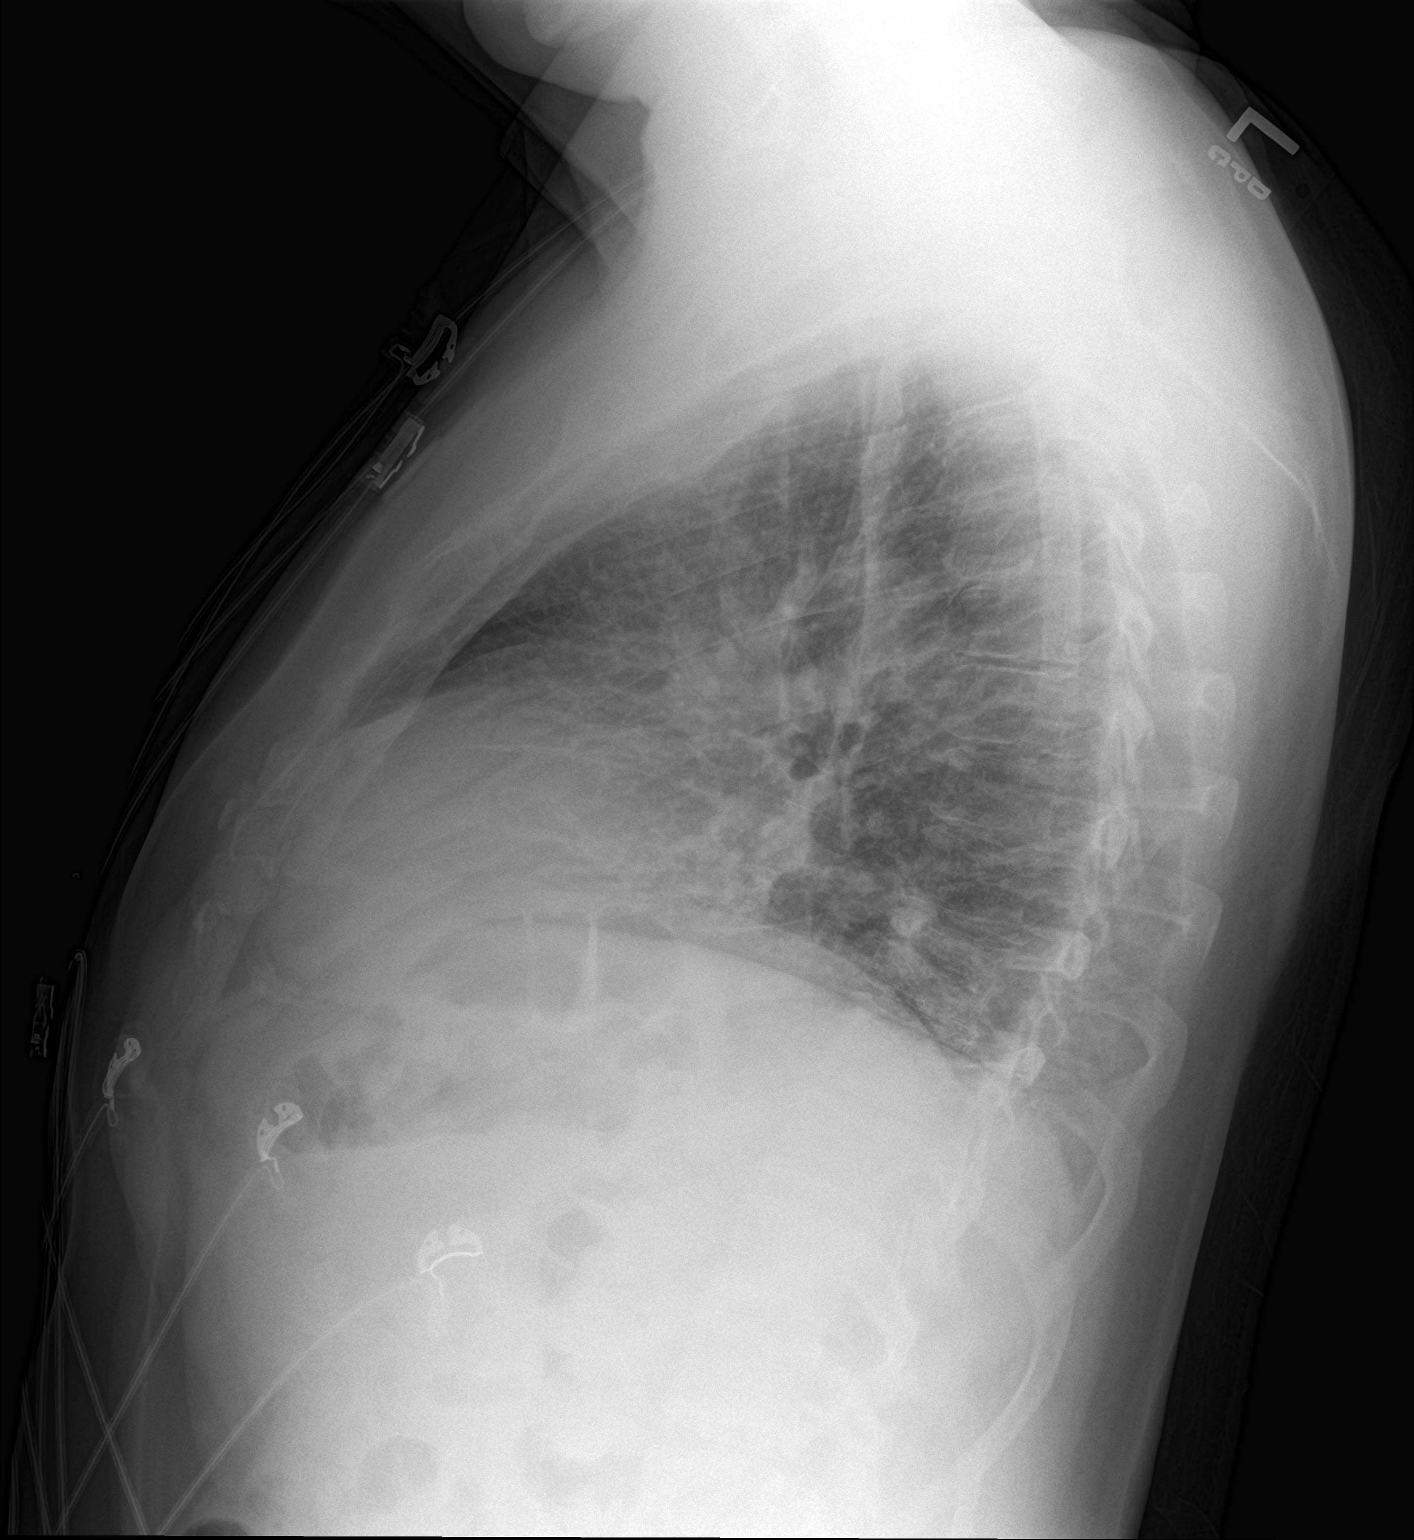

[2 of 2 positions shown; findings below may reference images not displayed]

FINDINGS: Chronically low lung volumes, mildly improved today. Mild
cardiomegaly. Other mediastinal contours are within normal limits.
Visualized tracheal air column is within normal limits. There is
probably a small chronic benign sclerotic bone island of the
posterior right 5th rib, unchanged over this series of exams.

No pneumothorax, pulmonary edema or pleural effusion. Crowding of
lung markings at the bases but no other abnormal pulmonary opacity.

No acute osseous abnormality identified. Negative visible bowel gas
pattern.
IMPRESSION: 1.  No acute cardiopulmonary abnormality.
2. Mild cardiomegaly.  Chronically low lung volumes.
3. Benign bone island suspected in the right 5th rib.

## 2019-08-03 ENCOUNTER — Ambulatory Visit: Payer: 59 | Admitting: Cardiology

## 2019-08-24 ENCOUNTER — Ambulatory Visit: Payer: 59

## 2019-08-30 MED FILL — METOPROLOL SUCCINATE ER 25: 25 | 90 days supply | Qty: 90 | Fill #0

## 2019-08-30 MED FILL — LISINOPRIL 2.5 MG TABLET: 2.5 | 90 days supply | Qty: 90 | Fill #0

## 2019-10-02 ENCOUNTER — Ambulatory Visit: Payer: 59

## 2019-10-02 ENCOUNTER — Other Ambulatory Visit: Payer: Self-pay

## 2019-12-11 MED FILL — METOPROLOL SUCCINATE ER 25: 25 | 90 days supply | Qty: 90 | Fill #1

## 2019-12-11 MED FILL — LISINOPRIL 2.5 MG TABLET: 2.5 | 90 days supply | Qty: 90 | Fill #1

## 2019-12-11 MED FILL — LISINOPRIL 2.5 MG TABLET: 90 days supply | Qty: 90 | Fill #1

## 2019-12-11 MED FILL — METOPROLOL SUCCINATE ER 25: 90 days supply | Qty: 90 | Fill #1

## 2020-03-13 ENCOUNTER — Other Ambulatory Visit: Payer: Self-pay | Admitting: Cardiology

## 2020-03-13 MED FILL — LISINOPRIL 2.5 MG TABLET: 2.5 | 90 days supply | Qty: 90 | Fill #0

## 2020-03-13 MED FILL — METOPROLOL SUCCINATE ER 25: 25 | 90 days supply | Qty: 90 | Fill #0

## 2020-05-27 ENCOUNTER — Other Ambulatory Visit: Payer: Self-pay

## 2020-05-27 ENCOUNTER — Ambulatory Visit
Admission: EM | Admit: 2020-05-27 | Discharge: 2020-05-27 | Disposition: A | Discharge status: Home / Self Care | Payer: 59 | Attending: Family Medicine | Admitting: Family Medicine

## 2020-05-27 DIAGNOSIS — G8929 Other chronic pain: Secondary | ICD-10-CM | POA: Diagnosis not present

## 2020-05-27 DIAGNOSIS — M5441 Lumbago with sciatica, right side: Secondary | ICD-10-CM

## 2020-05-27 DIAGNOSIS — M5442 Lumbago with sciatica, left side: Secondary | ICD-10-CM

## 2020-05-27 MED ORDER — TIZANIDINE HCL 4 MG PO TABS: 4.0000 mg | ORAL_TABLET | Freq: Every evening | ORAL | 0 refills | Status: DC | PRN

## 2020-05-27 MED ORDER — PREDNISONE 20 MG PO TABS: 40.0000 mg | ORAL_TABLET | Freq: Every day | ORAL | 0 refills | Status: DC

## 2020-05-27 MED ORDER — KETOROLAC TROMETHAMINE 30 MG/ML IJ SOLN
30.0000 mg | Freq: Once | INTRAMUSCULAR | Status: AC
  Administered 2020-05-27: 30 mg via INTRAMUSCULAR

## 2020-05-27 NOTE — ED Provider Notes (Signed)
[facility]    CSN: [id number] Arrival date & time: 05/27/20  1239      History   Chief Complaint Chief Complaint  Patient presents with  . Back Pain    HPI Eugene [name] is a 65 y.o. male.   HPI  Patient with a known history of chronic back pain presents today with an acute exacerbation x3 weeks.  He denies any back injury.  He is having right-sided back pain which is radiating into his left leg.  Has taken OTC medication without relief. Tylenol and biofreeze only temporary relief of symptoms.  Distant history of back pain had not had any issues until 3 weeks ago.  Past Medical History:  Diagnosis Date  . Back pain   . CAD (coronary artery disease)    a. 01/2017 NSTEMI/Cath: LM nl, LAD 25p - ? hypodense but no filling defect, RI nl, LCX nl, RCA nl, EF 55-65%-->Med Rx.  . Erectile dysfunction   . History of echocardiogram    a. 01/2017 Echo: EF 55-60%, no rwma.    Patient Active Problem List   Diagnosis Date Noted  . 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 CAD (coronary artery disease) 01/17/2017  . NSTEMI (non-ST elevated myocardial infarction) ([place]) 01/15/2017  . Obesity 09/14/2014  . Routine general medical examination at a health care facility 08/24/2013  . Prostate cancer screening 08/24/2013  . ED (erectile dysfunction) 01/20/2011    Past Surgical History:  Procedure Laterality Date  . LEFT HEART CATH AND CORONARY ANGIOGRAPHY N/A 01/15/2017   Procedure: LEFT HEART CATH AND CORONARY ANGIOGRAPHY;  Surgeon: Martinique, Peter M, MD;  Location: [facility];  Service: Cardiovascular;  Laterality: N/A;       Home Medications    Prior to Admission medications   Medication Sig Start Date End Date Taking? Authorizing Provider  predniSONE (DELTASONE) 20 MG tablet Take 2 tablets (40 mg total) by mouth daily with breakfast. 05/27/20  Yes Scot Jun, FNP  tiZANidine (ZANAFLEX) 4 MG tablet Take 1 tablet (4 mg total) by mouth at bedtime as needed for  muscle spasms. 05/27/20  Yes Scot Jun, FNP  aspirin 81 MG tablet Take 81 mg by mouth daily.      [provider]  lisinopril (ZESTRIL) 2.5 MG tablet TAKE 1 TABLET BY MOUTH DAILY. 03/13/20 03/13/21  Arnoldo Lenis, MD  meloxicam (MOBIC) 15 MG tablet Take 1 tablet (15 mg total) by mouth daily. 07/04/19   Sanjuana Kava, MD  metoprolol succinate (TOPROL-XL) 25 MG 24 hr tablet TAKE 1 TABLET BY MOUTH DAILY. 03/13/20 03/13/21  Arnoldo Lenis, MD  Multiple Vitamin (MULTIVITAMIN) tablet Take 1 tablet by mouth daily.      [provider]  nitroGLYCERIN (NITROSTAT) 0.4 MG SL tablet Place 1 tablet (0.4 mg total) under the tongue every 5 (five) minutes x 3 doses as needed for chest pain. 01/17/17   Theora Gianotti, NP  rosuvastatin (CRESTOR) 40 MG tablet Take 1 tablet (40 mg total) by mouth daily. 12/07/18   Alycia Rossetti, MD    Family History Family History  Problem Relation Age of Onset  . Cancer Mother        breast  . Diabetes Mother   . Diabetes Sister   . Hypertension Sister   . Cancer Brother   . Mental illness Sister     Social History Social History   Tobacco Use  . Smoking status: Never Smoker  . Smokeless tobacco: Never Used  Vaping Use  . Vaping Use: Never used  Substance Use Topics  . Alcohol use: No  . Drug use: No     Allergies   Patient has no known allergies.   Review of Systems Review of Systems Pertinent negatives listed in HPI  Physical Exam Triage Vital Signs ED Triage Vitals  Enc Vitals Group     BP 05/27/20 1554 133/83     Pulse Rate 05/27/20 1554 64     Resp 05/27/20 1554 18     Temp 05/27/20 1554 98.3 F (36.8 C)     Temp Source 05/27/20 1554 Oral     SpO2 05/27/20 1554 97 %     Weight --      Height --      Head Circumference --      Peak Flow --      Pain Score 05/27/20 1605 6     Pain Loc --      Pain Edu? --      Excl. in [place]? --    No data found.  Updated Vital Signs BP 133/83   Pulse 64   Temp  98.3 F (36.8 C) (Oral)   Resp 18   SpO2 97%   Visual Acuity Right Eye Distance:   Left Eye Distance:   Bilateral Distance:    Right Eye Near:   Left Eye Near:    Bilateral Near:     Physical Exam Constitutional:      Appearance: Normal appearance.  Cardiovascular:     Rate and Rhythm: Normal rate.  Pulmonary:     Effort: Pulmonary effort is normal.  Musculoskeletal:     Lumbar back: Tenderness present. Positive right straight leg raise test.  Skin:    Capillary Refill: Capillary refill takes less than 2 seconds.  Neurological:     General: No focal deficit present.     Mental Status: He is alert.     UC Treatments / Results  Labs (all labs ordered are listed, but only abnormal results are displayed) Labs Reviewed - No data to display  EKG   Radiology No results found.  Procedures Procedures (including critical care time)  Medications Ordered in UC Medications  ketorolac (TORADOL) 30 MG/ML injection 30 mg (has no administration in time range)    Initial Impression / Assessment and Plan / UC Course  I have reviewed the triage vital signs and the nursing notes.  Pertinent labs & imaging results that were available during my care of the patient were reviewed by me and considered in my medical decision making (see chart for details).    Right sided low back sciatica.  Toradol 30 mg IM given here in clinic.  Discharging home with prednisone 40 mg daily for 5 days.  Tizanidine at bedtime as needed for acute pain.  Follow-up with PCP as needed  Final Clinical Impressions(s) / UC Diagnoses   Final diagnoses:  Acute right-sided low back pain with bilateral sciatica   Discharge Instructions   None    ED Prescriptions    Medication Sig Dispense Auth. Provider   tiZANidine (ZANAFLEX) 4 MG tablet Take 1 tablet (4 mg total) by mouth at bedtime as needed for muscle spasms. 30 tablet Scot Jun, FNP   predniSONE (DELTASONE) 20 MG tablet Take 2 tablets (40  mg total) by mouth daily with breakfast. 10 tablet Scot Jun, FNP     PDMP not reviewed this encounter.   Scot Jun, FNP 05/27/20 [id number]

## 2020-05-27 NOTE — ED Triage Notes (Signed)
Pt presents with lower back pain for past 3 weeks, denies injury

## 2020-05-31 ENCOUNTER — Ambulatory Visit: Payer: 59 | Admitting: Cardiology

## 2020-06-11 ENCOUNTER — Ambulatory Visit (INDEPENDENT_AMBULATORY_CARE_PROVIDER_SITE_OTHER): Payer: 59

## 2020-06-11 ENCOUNTER — Ambulatory Visit
Admission: EM | Admit: 2020-06-11 | Discharge: 2020-06-11 | Disposition: A | Discharge status: Home / Self Care | Payer: 59 | Attending: Family Medicine | Admitting: Family Medicine

## 2020-06-11 ENCOUNTER — Encounter: Payer: Self-pay | Admitting: Emergency Medicine

## 2020-06-11 DIAGNOSIS — D472 Monoclonal gammopathy: Secondary | ICD-10-CM

## 2020-06-11 DIAGNOSIS — M25551 Pain in right hip: Secondary | ICD-10-CM

## 2020-06-11 DIAGNOSIS — M160 Bilateral primary osteoarthritis of hip: Secondary | ICD-10-CM | POA: Diagnosis not present

## 2020-06-11 DIAGNOSIS — M899 Disorder of bone, unspecified: Secondary | ICD-10-CM

## 2020-06-11 DIAGNOSIS — M5441 Lumbago with sciatica, right side: Secondary | ICD-10-CM

## 2020-06-11 DIAGNOSIS — M898X5 Other specified disorders of bone, thigh: Secondary | ICD-10-CM

## 2020-06-11 MED ORDER — KETOROLAC TROMETHAMINE 30 MG/ML IJ SOLN
30.0000 mg | Freq: Once | INTRAMUSCULAR | Status: AC
  Administered 2020-06-11: 30 mg via INTRAMUSCULAR

## 2020-06-11 MED ORDER — DEXAMETHASONE SODIUM PHOSPHATE 10 MG/ML IJ SOLN
10.0000 mg | Freq: Once | INTRAMUSCULAR | Status: AC
  Administered 2020-06-11: 10 mg via INTRAMUSCULAR

## 2020-06-11 MED ORDER — TRAMADOL HCL 50 MG PO TABS: 50.0000 mg | ORAL_TABLET | Freq: Four times a day (QID) | ORAL | 0 refills | Status: DC | PRN

## 2020-06-11 NOTE — Discharge Instructions (Addendum)
You received an injection of a steroid as well as Toradol for pain  Your xray shows bone lesions to your right thigh  This is why your medications are not working  Follow up with Dr Raliegh Ip  I have sent in tramadol for you to take for breakthrough pain, 1 tablet every 6 hours as needed  Follow-up with Dr. Aline Brochure for further management

## 2020-06-11 NOTE — ED Provider Notes (Signed)
[facility]    CSN: [id number] Arrival date & time: 06/11/20  0943      History   Chief Complaint Chief Complaint  Patient presents with  . Back Pain    HPI Eugene [name] is a 65 y.o. male.   Reports low back pain for the last month.  States that the pain can travel to his right hip as well as down his right leg.  Reports that the pain is mostly in his right leg at this point, as well as his right hip.  He was seen in this office on 05/27/2020 and was prescribed oral steroids, tizanidine, was given Toradol in the office.  States that this helped some, but not enough.  Has not seen orthopedics about this.  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  Denies numbness, tingling, loss of strength in the extremity, swelling, bruising, other symptoms.  ROS per HPI  The history is provided by the patient.  Back Pain   Past Medical History:  Diagnosis Date  . Back pain   . CAD (coronary artery disease)    a. 01/2017 NSTEMI/Cath: LM nl, LAD 25p - ? hypodense but no filling defect, RI nl, LCX nl, RCA nl, EF 55-65%-->Med Rx.  . Erectile dysfunction   . History of echocardiogram    a. 01/2017 Echo: EF 55-60%, no rwma.    Patient Active Problem List   Diagnosis Date Noted  . 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 CAD (coronary artery disease) 01/17/2017  . NSTEMI (non-ST elevated myocardial infarction) ([place]) 01/15/2017  . Obesity 09/14/2014  . Routine general medical examination at a health care facility 08/24/2013  . Prostate cancer screening 08/24/2013  . ED (erectile dysfunction) 01/20/2011    Past Surgical History:  Procedure Laterality Date  . LEFT HEART CATH AND CORONARY ANGIOGRAPHY N/A 01/15/2017   Procedure: LEFT HEART CATH AND CORONARY ANGIOGRAPHY;  Surgeon: Martinique, Peter M, MD;  Location: [facility];  Service: Cardiovascular;  Laterality: N/A;       Home Medications    Prior to Admission medications   Medication Sig Start Date End  Date Taking? Authorizing Provider  traMADol (ULTRAM) 50 MG tablet Take 1 tablet (50 mg total) by mouth every 6 (six) hours as needed. 06/11/20  Yes Faustino Congress, NP  aspirin 81 MG tablet Take 81 mg by mouth daily.      [provider]  lisinopril (ZESTRIL) 2.5 MG tablet TAKE 1 TABLET BY MOUTH DAILY. 03/13/20 03/13/21  Arnoldo Lenis, MD  meloxicam (MOBIC) 15 MG tablet Take 1 tablet (15 mg total) by mouth daily. 07/04/19   Sanjuana Kava, MD  metoprolol succinate (TOPROL-XL) 25 MG 24 hr tablet TAKE 1 TABLET BY MOUTH DAILY. 03/13/20 03/13/21  Arnoldo Lenis, MD  Multiple Vitamin (MULTIVITAMIN) tablet Take 1 tablet by mouth daily.      [provider]  nitroGLYCERIN (NITROSTAT) 0.4 MG SL tablet Place 1 tablet (0.4 mg total) under the tongue every 5 (five) minutes x 3 doses as needed for chest pain. 01/17/17   Theora Gianotti, NP  predniSONE (DELTASONE) 20 MG tablet Take 2 tablets (40 mg total) by mouth daily with breakfast. 05/27/20   Scot Jun, FNP  rosuvastatin (CRESTOR) 40 MG tablet Take 1 tablet (40 mg total) by mouth daily. 12/07/18   Bryce, Modena Nunnery, MD  tiZANidine (ZANAFLEX) 4 MG tablet Take 1 tablet (4 mg total) by mouth at bedtime as needed for muscle spasms. 05/27/20  Scot Jun, FNP    Family History Family History  Problem Relation Age of Onset  . Cancer Mother        breast  . Diabetes Mother   . Diabetes Sister   . Hypertension Sister   . Cancer Brother   . Mental illness Sister     Social History Social History   Tobacco Use  . Smoking status: Never Smoker  . Smokeless tobacco: Never Used  Vaping Use  . Vaping Use: Never used  Substance Use Topics  . Alcohol use: No  . Drug use: No     Allergies   Patient has no known allergies.   Review of Systems Review of Systems  Musculoskeletal: Positive for back pain.     Physical Exam Triage Vital Signs ED Triage Vitals  Enc Vitals Group     BP 06/11/20 1052  135/76     Pulse Rate 06/11/20 1052 93     Resp 06/11/20 1052 18     Temp --      Temp src --      SpO2 06/11/20 1052 96 %     Weight --      Height --      Head Circumference --      Peak Flow --      Pain Score 06/11/20 1054 7     Pain Loc --      Pain Edu? --      Excl. in [place]? --    No data found.  Updated Vital Signs BP 135/76   Pulse 93   Resp 18   SpO2 96%   Visual Acuity Right Eye Distance:   Left Eye Distance:   Bilateral Distance:    Right Eye Near:   Left Eye Near:    Bilateral Near:     Physical Exam Vitals and nursing note reviewed.  Constitutional:      General: He is not in acute distress.    Appearance: Normal appearance. He is well-developed and normal weight. He is not ill-appearing.  HENT:     Head: Normocephalic and atraumatic.     Nose: Nose normal.     Mouth/Throat:     Mouth: Mucous membranes are moist.     Pharynx: Oropharynx is clear.  Eyes:     Extraocular Movements: Extraocular movements intact.     Conjunctiva/sclera: Conjunctivae normal.     Pupils: Pupils are equal, round, and reactive to light.  Cardiovascular:     Rate and Rhythm: Normal rate and regular rhythm.  Pulmonary:     Effort: Pulmonary effort is normal. No respiratory distress.  Abdominal:     Palpations: Abdomen is soft.  Musculoskeletal:        General: Tenderness present.     Cervical back: Normal range of motion and neck supple.     Comments: Tenderness to the right hip, anterior right thigh, right low back No erythema, swelling, ecchymosis noted Positive right sided straight leg raise  Skin:    General: Skin is warm and dry.     Capillary Refill: Capillary refill takes less than 2 seconds.  Neurological:     General: No focal deficit present.     Mental Status: He is alert and oriented to person, place, and time.  Psychiatric:        Mood and Affect: Mood normal.        Behavior: Behavior normal.        Thought Content: Thought content normal.  UC Treatments / Results  Labs (all labs ordered are listed, but only abnormal results are displayed) Labs Reviewed - No data to display  EKG   Radiology DG Hip Unilat With Pelvis 2-3 Views Right  Result Date: 06/11/2020 CLINICAL DATA:  Back pain. EXAM: DG HIP (WITH OR WITHOUT PELVIS) 2-3V RIGHT COMPARISON:  10/19/2016. FINDINGS: Prominent lytic lesions are noted in the proximal and middle portions of the right femoral diaphysis. These findings suspicious for myeloma or metastatic disease. No acute bony or joint abnormality. No evidence of fracture dislocation. Degenerative changes lumbar spine and both hips. IMPRESSION: 1. Prominent lytic lesions noted the proximal midportion of the right femoral diaphysis. These findings are suspicious for myeloma or metastatic disease. 2.  No evidence of fracture or dislocation. Electronically Signed   By: Marcello Moores  Register   On: 06/11/2020 11:32    Procedures Procedures (including critical care time)  Medications Ordered in UC Medications  ketorolac (TORADOL) 30 MG/ML injection 30 mg (30 mg Intramuscular Given 06/11/20 1141)  dexamethasone (DECADRON) injection 10 mg (10 mg Intramuscular Given 06/11/20 1141)    Initial Impression / Assessment and Plan / UC Course  I have reviewed the triage vital signs and the nursing notes.  Pertinent labs & imaging results that were available during my care of the patient were reviewed by me and considered in my medical decision making (see chart for details).     MGUS Lytic bone lesion of right femur Right-sided sciatica Right hip pain  X-ray today shows 2 lytic bone lesions on the right femur just over the area of pain Toradol 30 mg IM given in office Decadron 10 mg IM given in office Tramadol prescribed prn breakthrough pain May take 800 mg ibuprofen with 1000 mg of Tylenol.  Do not exceed 4000 mg of Tylenol in 24 hours. Follow-up with Dr Delton Coombes Follow up with this office or with primary care  if symptoms are persisting.  Follow up in the ER for high fever, trouble swallowing, trouble breathing, other concerning symptoms.    Final Clinical Impressions(s) / UC Diagnoses   Final diagnoses:  Acute right-sided low back pain with right-sided sciatica  Right hip pain  MGUS (monoclonal gammopathy of unknown significance)  Lytic bone lesion of right femur     Discharge Instructions     You received an injection of a steroid as well as Toradol for pain  Your xray shows bone lesions to your right thigh  This is why your medications are not working  Follow up with Dr Raliegh Ip  I have sent in tramadol for you to take for breakthrough pain, 1 tablet every 6 hours as needed  Follow-up with Dr. Aline Brochure for further management    ED Prescriptions    Medication Sig Dispense Auth. Provider   traMADol (ULTRAM) 50 MG tablet Take 1 tablet (50 mg total) by mouth every 6 (six) hours as needed. 15 tablet Faustino Congress, NP     I have reviewed the PDMP during this encounter.   Faustino Congress, NP 06/11/20 1157

## 2020-06-11 NOTE — ED Triage Notes (Signed)
back pain x1 month travels down RT leg

## 2020-06-19 ENCOUNTER — Ambulatory Visit (HOSPITAL_COMMUNITY): Payer: 59 | Admitting: Hematology

## 2020-06-24 ENCOUNTER — Other Ambulatory Visit (HOSPITAL_COMMUNITY): Payer: Self-pay

## 2020-06-24 MED FILL — Metoprolol Succinate Tab ER 24HR 25 MG (Tartrate Equiv): ORAL | 90 days supply | Qty: 90 | Fill #0 | Status: AC

## 2020-06-24 MED FILL — Lisinopril Tab 2.5 MG: ORAL | 90 days supply | Qty: 90 | Fill #0 | Status: AC

## 2020-06-25 NOTE — Progress Notes (Signed)
[facility] [address] [place] [address]   CLINIC:  Medical Oncology/Hematology  CONSULT NOTE  Patient Care Team: [facility], Modena Nunnery, MD as PCP - General (Family Medicine) Eugene [name], Eugene Guild, MD as PCP - Cardiology (Cardiology) Eugene Harman, DO as Consulting Physician (Internal Medicine)  CHIEF COMPLAINTS/PURPOSE OF CONSULTATION:  Evaluation of lytic bone lesions of right femur  HISTORY OF PRESENTING ILLNESS:  [name] 65 y.o. male is here because of lytic bone lesions of right femur, at the request of Eugene Commander, NP.  He recently went to urgent care and x-rays on 06/11/2020 showed lytic lesions in the proximal midportion of the right femoral diaphysis.  Today he reports feeling well and he is accompanied by his daughter. He recently reported to urgent care on 06/11/2020 or sharp pain and numbness in his right leg and lower back. This pain began in April and is still present. This now shoots down into his foot; it is worsened by walking, and he presents with a limp. He was prescribed tramadol by the urgent care, and he takes it BID or TID prn. He had one episode of fever last week and denies any unexpected weight loss, headaches, or infections. He denies any issues with bowels or bladder. He works on his feet most of the day.  He is not a smoker. His brother had lung and brain cancer, and his other brother also had lung cancer. His sister had thyroid cancer, and his mother had breast cancer.   MEDICAL HISTORY:  Past Medical History:  Diagnosis Date   Back pain    CAD (coronary artery disease)    a. 01/2017 NSTEMI/Cath: LM nl, LAD 25p - ? hypodense but no filling defect, RI nl, LCX nl, RCA nl, EF 55-65%-->Med Rx.   Erectile dysfunction    History of echocardiogram    a. 01/2017 Echo: EF 55-60%, no rwma.    SURGICAL HISTORY: Past Surgical History:  Procedure Laterality Date   LEFT HEART CATH AND CORONARY ANGIOGRAPHY N/A 01/15/2017    Procedure: LEFT HEART CATH AND CORONARY ANGIOGRAPHY;  Surgeon: Martinique, Peter M, MD;  Location: [facility];  Service: Cardiovascular;  Laterality: N/A;    SOCIAL HISTORY: Social History   Socioeconomic History   Marital status: Divorced    Spouse name: Not on file   Number of children: Not on file   Years of education: Not on file   Highest education level: Not on file  Occupational History   Not on file  Tobacco Use   Smoking status: Never   Smokeless tobacco: Never  Vaping Use   Vaping Use: Never used  Substance and Sexual Activity   Alcohol use: No   Drug use: No   Sexual activity: Yes  Other Topics Concern   Not on file  Social History Narrative   Not on file   Social Determinants of Health   Financial Resource Strain: Low Risk    Difficulty of Paying Living Expenses: Not hard at all  Food Insecurity: No Food Insecurity   Worried About Charity fundraiser in the Last Year: Never true   [place] in the Last Year: Never true  Transportation Needs: No Transportation Needs   Lack of Transportation (Medical): No   Lack of Transportation (Non-Medical): No  Physical Activity: Insufficiently Active   Days of Exercise per Week: 2 days   Minutes of Exercise per Session: 10 min  Stress: No Stress Concern  Present   Feeling of Stress : Not at all  Social Connections: Moderately Integrated   Frequency of Communication with Friends and Family: More than three times a week   Frequency of Social Gatherings with Friends and Family: More than three times a week   Attends Religious Services: 1 to 4 times per year   Active Member of [employer] or Organizations: No   Attends Music therapist: 1 to 4 times per year   Marital Status: Divorced  Human resources officer Violence: Not At Risk   Fear of Current or Ex-Partner: No   Emotionally Abused: No   Physically Abused: No   Sexually Abused: No    FAMILY HISTORY: Family History  Problem Relation Age of Onset    Cancer Mother        breast   Diabetes Mother    Diabetes Sister    Hypertension Sister    Cancer Brother    Mental illness Sister     ALLERGIES:  has No Known Allergies.  MEDICATIONS:  Current Outpatient Medications  Medication Sig Dispense Refill   aspirin 81 MG tablet Take 81 mg by mouth daily.       lisinopril (ZESTRIL) 2.5 MG tablet TAKE 1 TABLET BY MOUTH DAILY. 90 tablet 3   meloxicam (MOBIC) 15 MG tablet Take 1 tablet (15 mg total) by mouth daily. 30 tablet 3   metoprolol succinate (TOPROL-XL) 25 MG 24 hr tablet TAKE 1 TABLET BY MOUTH DAILY. 90 tablet 3   Multiple Vitamin (MULTIVITAMIN) tablet Take 1 tablet by mouth daily.       predniSONE (DELTASONE) 20 MG tablet Take 2 tablets (40 mg total) by mouth daily with breakfast. 10 tablet 0   rosuvastatin (CRESTOR) 40 MG tablet Take 1 tablet (40 mg total) by mouth daily. 90 tablet 3   tiZANidine (ZANAFLEX) 4 MG tablet Take 1 tablet (4 mg total) by mouth at bedtime as needed for muscle spasms. 30 tablet 0   traMADol (ULTRAM) 50 MG tablet Take 1 tablet (50 mg total) by mouth every 6 (six) hours as needed. 15 tablet 0   nitroGLYCERIN (NITROSTAT) 0.4 MG SL tablet Place 1 tablet (0.4 mg total) under the tongue every 5 (five) minutes x 3 doses as needed for chest pain. (Patient not taking: Reported on 06/26/2020) 25 tablet 3   No current facility-administered medications for this visit.    REVIEW OF SYSTEMS:   Review of Systems  Constitutional:  Positive for fever. Negative for appetite change, fatigue and unexpected weight change.  Gastrointestinal:  Negative for constipation and diarrhea.  Genitourinary:  Negative for difficulty urinating.   Musculoskeletal:  Positive for gait problem and myalgias (R leeg 8/10).  Neurological:  Positive for gait problem and numbness (R leg). Negative for headaches.  All other systems reviewed and are negative.   PHYSICAL EXAMINATION: ECOG PERFORMANCE STATUS: 0 - Asymptomatic  Vitals:   06/26/20  0804  BP: 122/75  Pulse: 72  Resp: 18  Temp: 98.8 F (37.1 C)  SpO2: 99%   Filed Weights   06/26/20 0804  Weight: 217 lb (98.4 kg)   Physical Exam Vitals reviewed.  Constitutional:      Appearance: Normal appearance. He is obese.  Cardiovascular:     Rate and Rhythm: Normal rate and regular rhythm.     Pulses: Normal pulses.     Heart sounds: Normal heart sounds.  Pulmonary:     Effort: Pulmonary effort is normal.     Breath sounds: Normal  breath sounds.  Abdominal:     Palpations: Abdomen is soft. There is no hepatomegaly, splenomegaly or mass.     Tenderness: no abdominal tenderness  Musculoskeletal:     Right lower leg: No edema.     Left lower leg: No edema.     Comments: strength 5/5 bilaterally in lower extremities  Neurological:     General: No focal deficit present.     Mental Status: He is alert and oriented to person, place, and time.  Psychiatric:        Mood and Affect: Mood normal.        Behavior: Behavior normal.     LABORATORY DATA:  I have reviewed the data as listed CBC Latest Ref Rng & Units 06/26/2020 06/02/2019 11/28/2018  WBC 4.0 - 10.5 K/uL 6.4 5.9 6.4  Hemoglobin 13.0 - 17.0 g/dL 10.7(L) 12.3(L) 10.9(L)  Hematocrit 39.0 - 52.0 % 33.3(L) 36.6(L) 33.4(L)  Platelets 150 - 400 K/uL 252 259 252   CMP Latest Ref Rng & Units 06/26/2020 06/02/2019 12/07/2018  Glucose 70 - 99 mg/dL 96 90 -  BUN 8 - 23 mg/dL 25(H) 19 -  Creatinine 0.61 - 1.24 mg/dL 1.10 1.09 -  Sodium 135 - 145 mmol/L 134(L) 134(L) -  Potassium 3.5 - 5.1 mmol/L 3.9 4.3 -  Chloride 98 - 111 mmol/L 101 102 -  CO2 22 - 32 mmol/L 27 25 -  Calcium 8.9 - 10.3 mg/dL 8.9 9.2 -  Total Protein 6.5 - 8.1 g/dL 8.7(H) 9.0(H) 9.3(H)  Total Bilirubin 0.3 - 1.2 mg/dL 0.5 0.6 -  Alkaline Phos 38 - 126 U/L 65 - -  AST 15 - 41 U/L 27 20 -  ALT 0 - 44 U/L 22 19 -    RADIOGRAPHIC STUDIES: I have personally reviewed the radiological images as listed and agreed with the findings in the report. DG  Hip Unilat With Pelvis 2-3 Views Right  Result Date: 06/11/2020 CLINICAL DATA:  Back pain. EXAM: DG HIP (WITH OR WITHOUT PELVIS) 2-3V RIGHT COMPARISON:  10/19/2016. FINDINGS: Prominent lytic lesions are noted in the proximal and middle portions of the right femoral diaphysis. These findings suspicious for myeloma or metastatic disease. No acute bony or joint abnormality. No evidence of fracture dislocation. Degenerative changes lumbar spine and both hips. IMPRESSION: 1. Prominent lytic lesions noted the proximal midportion of the right femoral diaphysis. These findings are suspicious for myeloma or metastatic disease. 2.  No evidence of fracture or dislocation. Electronically Signed   By: Marcello Moores  Register   On: 06/11/2020 11:32    ASSESSMENT:  1.  Right femur lytic lesions: - He reported low back pain radiating to the right thigh since April, pain gets worse when he walks for more than 2 blocks. - He had history of MDS and was last seen in our clinic in 2019.  He had an abnormal M spike of 3.2 g on 12/06/2018. - Skeletal survey on 10/22/2017 showed no suspicious lytic lesions. - X-ray of the right sided pelvis on 06/11/2020 showed prominent lytic lesions noted in the proximal midportion of the right femoral diaphysis with no evidence of fracture or dislocation.  2.  Social/family history: - He works at [employer] in environmental services. - He was never smoker. - 2 brothers died of metastatic lung cancer.  Sister has thyroid cancer.  Mother had breast cancer.    PLAN:  1.  Highly probable multiple myeloma: -He was evaluated in our clinic for MGUS in 2019 but  was lost to follow-up. - Recent onset right mid thigh pain that led him to go to urgent care.  X-ray showed prominent lytic lesions in the right femoral diaphysis on 06/11/2020. - We talked about the possibility of diagnosis of multiple myeloma in the setting. - We will order whole-body PET CT scan to evaluate for bone lesions as  well as extramedullary plasmacytomas. - Examination today shows slight decrease in strength of the hip flexion.  No change in bowel or bladder function. - I have recommended further work-up with SPEP, immunofixation, free light chains, LDH, beta-2 microglobulin.  We will also check 24-hour urine for total protein, UPEP and immunofixation. - Recommend bone marrow aspiration and biopsy and send samples for chromosome analysis and myeloma FISH panel. - He will come back in 2 weeks after the biopsy to discuss results and further treatment plan.  2.  Right mid thigh pain: - We will give him tramadol 50 mg to be taken every 6 hours as needed. - Once the diagnosis of myeloma established, will consider radiation therapy if there is no pain improvement. - We will also obtain x-rays of the femur 2 views to see if he needs any surgical stabilization. - Once the myeloma diagnosis established, will also consider bisphosphonate therapy.  All questions were answered. The patient knows to call the clinic with any problems, questions or concerns.    Derek Jack, MD, 06/26/20 11:38 AM  [place] [phone]   I, Thana Ates, am acting as a scribe for Dr. Derek Jack.  I, Derek Jack MD, have reviewed the above documentation for accuracy and completeness, and I agree with the above.

## 2020-06-26 ENCOUNTER — Encounter (HOSPITAL_COMMUNITY): Payer: Self-pay | Admitting: Hematology

## 2020-06-26 ENCOUNTER — Encounter (HOSPITAL_COMMUNITY): Payer: Self-pay

## 2020-06-26 ENCOUNTER — Ambulatory Visit (HOSPITAL_COMMUNITY)
Admission: RE | Admit: 2020-06-26 | Discharge: 2020-06-26 | Disposition: A | Discharge status: Home / Self Care | Payer: 59 | Source: Ambulatory Visit | Attending: Hematology | Admitting: Hematology

## 2020-06-26 ENCOUNTER — Other Ambulatory Visit (HOSPITAL_COMMUNITY): Payer: Self-pay

## 2020-06-26 ENCOUNTER — Inpatient Hospital Stay (HOSPITAL_COMMUNITY): Payer: 59 | Attending: Hematology | Admitting: Hematology

## 2020-06-26 ENCOUNTER — Other Ambulatory Visit: Payer: Self-pay

## 2020-06-26 ENCOUNTER — Inpatient Hospital Stay (HOSPITAL_COMMUNITY): Payer: 59

## 2020-06-26 VITALS — BP 122/75 | HR 72 | Temp 98.8°F | Resp 18 | Ht 69.0 in | Wt 217.0 lb

## 2020-06-26 DIAGNOSIS — M79651 Pain in right thigh: Secondary | ICD-10-CM | POA: Diagnosis not present

## 2020-06-26 DIAGNOSIS — D472 Monoclonal gammopathy: Secondary | ICD-10-CM

## 2020-06-26 DIAGNOSIS — M898X5 Other specified disorders of bone, thigh: Secondary | ICD-10-CM | POA: Diagnosis not present

## 2020-06-26 DIAGNOSIS — M859 Disorder of bone density and structure, unspecified: Secondary | ICD-10-CM | POA: Insufficient documentation

## 2020-06-26 DIAGNOSIS — M899 Disorder of bone, unspecified: Secondary | ICD-10-CM

## 2020-06-26 LAB — COMPREHENSIVE METABOLIC PANEL
ALT: 22 U/L (ref 0–44)
AST: 27 U/L (ref 15–41)
Albumin: 3.7 g/dL (ref 3.5–5.0)
Alkaline Phosphatase: 65 U/L (ref 38–126)
Anion gap: 6 (ref 5–15)
BUN: 25 mg/dL — ABNORMAL HIGH (ref 8–23)
CO2: 27 mmol/L (ref 22–32)
Calcium: 8.9 mg/dL (ref 8.9–10.3)
Chloride: 101 mmol/L (ref 98–111)
Creatinine, Ser: 1.1 mg/dL (ref 0.61–1.24)
GFR, Estimated: >60 mL/min (ref >60)
Glucose, Bld: 96 mg/dL (ref 70–99)
Potassium: 3.9 mmol/L (ref 3.5–5.1)
Sodium: 134 mmol/L — ABNORMAL LOW (ref 135–145)
Total Bilirubin: 0.5 mg/dL (ref 0.3–1.2)
Total Protein: 8.7 g/dL — ABNORMAL HIGH (ref 6.5–8.1)

## 2020-06-26 LAB — CBC WITH DIFFERENTIAL/PLATELET
Abs Immature Granulocytes: 0.02 10*3/uL (ref 0.00–0.07)
Basophils Absolute: 0 10*3/uL (ref 0.0–0.1)
Basophils Relative: 0 %
Eosinophils Absolute: 0.1 10*3/uL (ref 0.0–0.5)
Eosinophils Relative: 1 %
HCT: 33.3 % — ABNORMAL LOW (ref 39.0–52.0)
Hemoglobin: 10.7 g/dL — ABNORMAL LOW (ref 13.0–17.0)
Immature Granulocytes: 0 %
Lymphocytes Relative: 43 %
Lymphs Abs: 2.8 10*3/uL (ref 0.7–4.0)
MCH: 32.7 pg (ref 26.0–34.0)
MCHC: 32.1 g/dL (ref 30.0–36.0)
MCV: 101.8 fL — ABNORMAL HIGH (ref 80.0–100.0)
Monocytes Absolute: 0.7 10*3/uL (ref 0.1–1.0)
Monocytes Relative: 12 %
Neutro Abs: 2.8 10*3/uL (ref 1.7–7.7)
Neutrophils Relative %: 44 %
Platelets: 252 10*3/uL (ref 150–400)
RBC: 3.27 MIL/uL — ABNORMAL LOW (ref 4.22–5.81)
RDW: 13.2 % (ref 11.5–15.5)
WBC: 6.4 10*3/uL (ref 4.0–10.5)
nRBC: 0 % (ref 0.0–0.2)

## 2020-06-26 LAB — LACTATE DEHYDROGENASE: LDH: 136 U/L (ref 98–192)

## 2020-06-26 MED ORDER — TRAMADOL HCL 50 MG PO TABS: 50.0000 mg | ORAL_TABLET | Freq: Four times a day (QID) | ORAL | 0 refills | Status: DC | PRN

## 2020-06-26 NOTE — Patient Instructions (Addendum)
[facility] at [facility] Discharge Instructions  You were seen and examined today by Dr. Delton Coombes. Dr. Delton Coombes is a medical oncologist, meaning he specializes in the management of cancer diagnoses. Dr. Delton Coombes discussed your past medical history, family history of cancer and the events that led to you being here today.  You were seen by Dr. Delton Coombes related to the findings on your recent hip X-ray. This revealed bone lesions present in your thigh bone. This is concerning for Multiple Myeloma. You were previously seen for MGUS in 2019. This was an abnormal protein in the blood that is able to progress to Multiple Myeloma. Dr. Delton Coombes has recommended additional blood work today to check the protein in your blood as well as a 24-hour Urine to see if there is any of that protein present in your urine.  Dr. Delton Coombes has also recommended a PET scan. A PET scan is a specialized CT scan that illuminates cancer if it is present in your body. Dr. Delton Coombes has also recommended a bone marrow biopsy to assess if the issue that is causing the bone lesions is arising from this bone marrow, this would identify Multiple Myeloma definitively.   Dr. Delton Coombes has recommended an additional X-Ray of your entire thigh bone because the hip X-Ray did not completely capture the thigh bone.   For the pain, Dr. Delton Coombes will refill the tramadol. If the pain persists despite tramadol, please call the clinic and let us know.  Dr. Delton Coombes will see you back following the PET scan and Bone Marrow Biopsy.    Thank you for choosing [place] at [facility] to provide your oncology and hematology care.  To afford each patient quality time with our provider, please arrive at least 15 minutes before your scheduled appointment time.   If you have a lab appointment with the [facility] please come in thru the Main Entrance and check in at the main information  desk.  You need to re-schedule your appointment should you arrive 10 or more minutes late.  We strive to give you quality time with our providers, and arriving late affects you and other patients whose appointments are after yours.  Also, if you no show three or more times for appointments you may be dismissed from the clinic at the providers discretion.     Again, thank you for choosing [facility]  Our hope is that these requests will decrease the amount of time that you wait before being seen by our physicians.       _____________________________________________________________  Should you have questions after your visit to [facility], please contact our office at [phone] and follow the prompts.  Our office hours are 8:00 a.m. and 4:30 p.m. Monday - Friday.  Please note that voicemails left after 4:00 p.m. may not be returned until the following business day.  We are closed weekends and major holidays.  You do have access to a nurse 24-7, just call the main number to the clinic [phone] and do not press any options, hold on the line and a nurse will answer the phone.    For prescription refill requests, have your pharmacy contact our office and allow 72 hours.    Due to Covid, you will need to wear a mask upon entering the hospital. If you do not have a mask, a mask will be given to you at the Main Entrance upon arrival. For doctor visits, patients  may have 1 support person age 36 or older with them. For treatment visits, patients can not have anyone with them due to social distancing guidelines and our immunocompromised population.

## 2020-06-26 NOTE — Progress Notes (Signed)
I met with the patient and his daughter today during and after initial visit with Dr. Katragadda. I introduced myself and explained my role in the patient's care. Patient and daughter were given the opportunity to ask questions and all were answered to their satisfaction. I provided my contact information and encouraged patient and family to call with questions or concerns. 

## 2020-06-27 LAB — PROTEIN ELECTROPHORESIS, SERUM
A/G Ratio: 0.8 (ref 0.7–1.7)
Albumin ELP: 3.7 g/dL (ref 2.9–4.4)
Alpha-1-Globulin: 0.2 g/dL (ref 0.0–0.4)
Alpha-2-Globulin: 0.7 g/dL (ref 0.4–1.0)
Beta Globulin: 1 g/dL (ref 0.7–1.3)
Gamma Globulin: 2.9 g/dL — ABNORMAL HIGH (ref 0.4–1.8)
Globulin, Total: 4.8 g/dL — ABNORMAL HIGH (ref 2.2–3.9)
M-Spike, %: 2.6 g/dL — ABNORMAL HIGH
Total Protein ELP: 8.5 g/dL (ref 6.0–8.5)

## 2020-06-27 LAB — KAPPA/LAMBDA LIGHT CHAINS
Kappa free light chain: 76.2 mg/L — ABNORMAL HIGH (ref 3.3–19.4)
Kappa, lambda light chain ratio: 6.57 — ABNORMAL HIGH (ref 0.26–1.65)
Lambda free light chains: 11.6 mg/L (ref 5.7–26.3)

## 2020-06-27 LAB — BETA 2 MICROGLOBULIN, SERUM: Beta-2 Microglobulin: 2.1 mg/L (ref 0.6–2.4)

## 2020-06-28 ENCOUNTER — Other Ambulatory Visit (HOSPITAL_COMMUNITY): Payer: Self-pay | Admitting: *Deleted

## 2020-06-28 DIAGNOSIS — M79651 Pain in right thigh: Secondary | ICD-10-CM | POA: Diagnosis not present

## 2020-06-28 DIAGNOSIS — D472 Monoclonal gammopathy: Secondary | ICD-10-CM

## 2020-06-28 DIAGNOSIS — M859 Disorder of bone density and structure, unspecified: Secondary | ICD-10-CM | POA: Diagnosis not present

## 2020-07-01 LAB — IMMUNOFIXATION ELECTROPHORESIS
IgA: 83 mg/dL (ref 61–437)
IgG (Immunoglobin G), Serum: 3648 mg/dL — ABNORMAL HIGH (ref 603–1613)
IgM (Immunoglobulin M), Srm: 15 mg/dL — ABNORMAL LOW (ref 20–172)
Total Protein ELP: 8.8 g/dL — ABNORMAL HIGH (ref 6.0–8.5)

## 2020-07-01 LAB — UPEP/UIFE/LIGHT CHAINS/TP, 24-HR UR
% BETA, Urine: 47.4 %
ALPHA 1 URINE: 4.8 %
Albumin, U: 10.5 %
Alpha 2, Urine: 8.4 %
Free Kappa Lt Chains,Ur: 258.89 mg/L — ABNORMAL HIGH (ref 1.17–86.46)
Free Kappa/Lambda Ratio: 114.05 — ABNORMAL HIGH (ref 1.83–14.26)
Free Lambda Lt Chains,Ur: 2.27 mg/L (ref 0.27–15.21)
GAMMA GLOBULIN URINE: 28.9 %
M-SPIKE %, Urine: 27.4 % — ABNORMAL HIGH
M-Spike, Mg/24 Hr: 37 mg/24 hr — ABNORMAL HIGH
Total Protein, Urine-Ur/day: 134 mg/24 hr (ref 30–150)
Total Protein, Urine: 8.9 mg/dL
Total Volume: 1500

## 2020-07-02 ENCOUNTER — Ambulatory Visit: Payer: 59 | Admitting: Internal Medicine

## 2020-07-02 ENCOUNTER — Other Ambulatory Visit: Payer: Self-pay

## 2020-07-02 ENCOUNTER — Encounter: Payer: Self-pay | Admitting: Internal Medicine

## 2020-07-02 VITALS — BP 129/68 | HR 69 | Temp 98.7°F | Resp 18 | Ht 69.0 in | Wt 217.8 lb

## 2020-07-02 DIAGNOSIS — Z7689 Persons encountering health services in other specified circumstances: Secondary | ICD-10-CM

## 2020-07-02 DIAGNOSIS — I2510 Atherosclerotic heart disease of native coronary artery without angina pectoris: Secondary | ICD-10-CM | POA: Diagnosis not present

## 2020-07-02 DIAGNOSIS — Z131 Encounter for screening for diabetes mellitus: Secondary | ICD-10-CM

## 2020-07-02 DIAGNOSIS — I10 Essential (primary) hypertension: Secondary | ICD-10-CM

## 2020-07-02 DIAGNOSIS — Z125 Encounter for screening for malignant neoplasm of prostate: Secondary | ICD-10-CM | POA: Diagnosis not present

## 2020-07-02 DIAGNOSIS — Z1211 Encounter for screening for malignant neoplasm of colon: Secondary | ICD-10-CM

## 2020-07-02 DIAGNOSIS — D472 Monoclonal gammopathy: Secondary | ICD-10-CM

## 2020-07-02 DIAGNOSIS — D539 Nutritional anemia, unspecified: Secondary | ICD-10-CM | POA: Diagnosis not present

## 2020-07-02 NOTE — Assessment & Plan Note (Signed)
Care established Previous chart reviewed History and medications reviewed with the patient 

## 2020-07-02 NOTE — Assessment & Plan Note (Signed)
Last CBC reviewed, could be effect of MM and/or B12/folic acid deficiency - advised to start Vitamin B12 1000 mcg QD for now F/u with Heme/Onc.

## 2020-07-02 NOTE — Assessment & Plan Note (Signed)
Has had cardiac cath in the past, no stents On Aspirin, statin and B-blocker F/u with Cardiology

## 2020-07-02 NOTE — Progress Notes (Signed)
New Patient Office Visit  Subjective:  Patient ID: Eugene [name], male    DOB: 1955/07/03  Age: 65 y.o. MRN: [id number]  CC:  Chief Complaint  Patient presents with   New Patient (Initial Visit)    New patient was seeing dr Buelah Manis needs an annual exam and colonoscopy sees oncology and cardiology     HPI Eugene [name] is a 65 year old male with PMH of CAD, HTN, monoclonal gammopathy and obesity who presents for establishing care. He is a former patient of Dr Buelah Manis.  He had been having leg pain, for which he had X-ray of femur, which shoed lytic lesions. Due to his history of monoclonal gammopathy, he is undergoing evaluation for MM and is followed by Oncology. He takes Tramadol PRN for severe pain.  BP is well-controlled. Takes medications regularly. Patient denies headache, dizziness, chest pain, dyspnea or palpitations. Has h/o CAD (?) and takes Aspirin, statin and B-blocker.  He has had 2 doses of COVID vaccine.  Past Medical History:  Diagnosis Date   Back pain    CAD (coronary artery disease)    a. 01/2017 NSTEMI/Cath: LM nl, LAD 25p - ? hypodense but no filling defect, RI nl, LCX nl, RCA nl, EF 55-65%-->Med Rx.   Erectile dysfunction    History of echocardiogram    a. 01/2017 Echo: EF 55-60%, no rwma.    Past Surgical History:  Procedure Laterality Date   LEFT HEART CATH AND CORONARY ANGIOGRAPHY N/A 01/15/2017   Procedure: LEFT HEART CATH AND CORONARY ANGIOGRAPHY;  Surgeon: Martinique, Peter M, MD;  Location: [place] [facility];  Service: Cardiovascular;  Laterality: N/A;    Family History  Problem Relation Age of Onset   Cancer Mother        breast   Diabetes Mother    Diabetes Sister    Hypertension Sister    Cancer Brother    Mental illness Sister     Social History   Socioeconomic History   Marital status: Divorced    Spouse name: Not on file   Number of children: Not on file   Years of education: Not on file   Highest education level: Not on file   Occupational History   Not on file  Tobacco Use   Smoking status: Never   Smokeless tobacco: Never  Vaping Use   Vaping Use: Never used  Substance and Sexual Activity   Alcohol use: No   Drug use: No   Sexual activity: Yes  Other Topics Concern   Not on file  Social History Narrative   Not on file   Social Determinants of Health   Financial Resource Strain: Low Risk    Difficulty of Paying Living Expenses: Not hard at all  Food Insecurity: No Food Insecurity   Worried About Charity fundraiser in the Last Year: Never true   [place] in the Last Year: Never true  Transportation Needs: No Transportation Needs   Lack of Transportation (Medical): No   Lack of Transportation (Non-Medical): No  Physical Activity: Insufficiently Active   Days of Exercise per Week: 2 days   Minutes of Exercise per Session: 10 min  Stress: No Stress Concern Present   Feeling of Stress : Not at all  Social Connections: Moderately Integrated   Frequency of Communication with Friends and Family: More than three times a week   Frequency of Social Gatherings with Friends and Family: More than three times a week   Attends Religious Services:  1 to 4 times per year   Active Member of Clubs or Organizations: No   Attends Archivist Meetings: 1 to 4 times per year   Marital Status: Divorced  Human resources officer Violence: Not At Risk   Fear of Current or Ex-Partner: No   Emotionally Abused: No   Physically Abused: No   Sexually Abused: No    ROS Review of Systems  Constitutional:  Positive for fatigue. Negative for chills and fever.  HENT:  Negative for congestion and sore throat.   Eyes:  Negative for pain and discharge.  Respiratory:  Negative for cough and shortness of breath.   Cardiovascular:  Negative for chest pain and palpitations.  Gastrointestinal:  Negative for constipation, diarrhea, nausea and vomiting.  Endocrine: Negative for polydipsia and polyuria.  Genitourinary:   Negative for dysuria and hematuria.  Musculoskeletal:  Positive for arthralgias and myalgias. Negative for neck pain and neck stiffness.  Skin:  Negative for rash.  Neurological:  Negative for dizziness, weakness, numbness and headaches.  Psychiatric/Behavioral:  Negative for agitation and behavioral problems.    Objective:   Today's Vitals: BP 129/68 (BP Location: Left Arm, Patient Position: Sitting, Cuff Size: Normal)   Pulse 69   Temp 98.7 F (37.1 C) (Oral)   Resp 18   Ht $R'5\' 9"'dG$  (1.753 m)   Wt 217 lb 12.8 oz (98.8 kg)   SpO2 96%   BMI 32.16 kg/m   Physical Exam Vitals reviewed.  Constitutional:      General: He is not in acute distress.    Appearance: He is not diaphoretic.  HENT:     Head: Normocephalic and atraumatic.     Nose: Nose normal.     Mouth/Throat:     Mouth: Mucous membranes are moist.  Eyes:     General: No scleral icterus.    Extraocular Movements: Extraocular movements intact.  Cardiovascular:     Rate and Rhythm: Normal rate and regular rhythm.     Pulses: Normal pulses.     Heart sounds: Normal heart sounds. No murmur heard. Pulmonary:     Breath sounds: Normal breath sounds. No wheezing or rales.  Musculoskeletal:     Cervical back: Neck supple. No tenderness.     Right lower leg: No edema.     Left lower leg: No edema.  Skin:    General: Skin is warm.     Findings: No rash.  Neurological:     General: No focal deficit present.     Mental Status: He is alert and oriented to person, place, and time.     Sensory: No sensory deficit.     Motor: No weakness.  Psychiatric:        Mood and Affect: Mood normal.        Behavior: Behavior normal.    Assessment & Plan:   Problem List Items Addressed This Visit       Encounter to establish care - Primary   Care established Previous chart reviewed History and medications reviewed with the patient      Relevant Orders  TSH + free T4    Cardiovascular and Mediastinum   CAD (coronary  artery disease)    Has had cardiac cath in the past, no stents On Aspirin, statin and B-blocker F/u with Cardiology       Relevant Orders   Lipid Profile   Essential hypertension    BP Readings from Last 1 Encounters:  07/02/20 129/68  Well-controlled Counseled for compliance  with the medications Advised DASH diet and moderate exercise/walking as tolerated         Other   Gammopathy, monoclonal    Undergoing evaluation for MM Followed by Oncology - scheduled for PET scan and bone marrow biopsy       Macrocytic anemia    Last CBC reviewed, could be effect of MM and/or B12/folic acid deficiency - advised to start Vitamin B12 1000 mcg QD for now F/u with Heme/Onc.       Other Visit Diagnoses     Special screening for malignant neoplasms, colon       Relevant Orders   Ambulatory referral to Gastroenterology   Screening for prostate cancer       Relevant Orders   PSA   Screening for diabetes mellitus (DM)       Relevant Orders   Hemoglobin A1c       Outpatient Encounter Medications as of 07/02/2020  Medication Sig   aspirin 81 MG tablet Take 81 mg by mouth daily.     lisinopril (ZESTRIL) 2.5 MG tablet TAKE 1 TABLET BY MOUTH DAILY.   metoprolol succinate (TOPROL-XL) 25 MG 24 hr tablet TAKE 1 TABLET BY MOUTH DAILY.   Multiple Vitamin (MULTIVITAMIN) tablet Take 1 tablet by mouth daily.     nitroGLYCERIN (NITROSTAT) 0.4 MG SL tablet Place 1 tablet (0.4 mg total) under the tongue every 5 (five) minutes x 3 doses as needed for chest pain.   rosuvastatin (CRESTOR) 40 MG tablet Take 1 tablet (40 mg total) by mouth daily.   traMADol (ULTRAM) 50 MG tablet Take 1 tablet (50 mg total) by mouth every 6 (six) hours as needed.   [DISCONTINUED] meloxicam (MOBIC) 15 MG tablet Take 1 tablet (15 mg total) by mouth daily. (Patient not taking: Reported on 07/02/2020)   [DISCONTINUED] predniSONE (DELTASONE) 20 MG tablet Take 2 tablets (40 mg total) by mouth daily with breakfast. (Patient  not taking: Reported on 07/02/2020)   [DISCONTINUED] tiZANidine (ZANAFLEX) 4 MG tablet Take 1 tablet (4 mg total) by mouth at bedtime as needed for muscle spasms. (Patient not taking: Reported on 07/02/2020)   No facility-administered encounter medications on file as of 07/02/2020.    Follow-up: Return in about 3 weeks (around 07/23/2020) for Annual physical.   [name], MD

## 2020-07-02 NOTE — Assessment & Plan Note (Signed)
BP Readings from Last 1 Encounters:  07/02/20 129/68   Well-controlled Counseled for compliance with the medications Advised DASH diet and moderate exercise/walking as tolerated

## 2020-07-02 NOTE — Assessment & Plan Note (Signed)
Undergoing evaluation for MM Followed by Oncology - scheduled for PET scan and bone marrow biopsy

## 2020-07-02 NOTE — Patient Instructions (Addendum)
Please get fasting blood tests done before the next visit.  Please continue to follow low salt diet and ambulate as tolerated.  Continue to follow up with Oncology as scheduled.  You are being referred to GI for screening colonoscopy.

## 2020-07-04 ENCOUNTER — Ambulatory Visit
Admission: RE | Admit: 2020-07-04 | Discharge: 2020-07-04 | Disposition: A | Discharge status: Home / Self Care | Payer: 59 | Source: Ambulatory Visit | Attending: Hematology | Admitting: Hematology

## 2020-07-04 ENCOUNTER — Other Ambulatory Visit: Payer: Self-pay

## 2020-07-04 DIAGNOSIS — I700 Atherosclerosis of aorta: Secondary | ICD-10-CM | POA: Insufficient documentation

## 2020-07-04 DIAGNOSIS — K760 Fatty (change of) liver, not elsewhere classified: Secondary | ICD-10-CM | POA: Insufficient documentation

## 2020-07-04 DIAGNOSIS — C9000 Multiple myeloma not having achieved remission: Secondary | ICD-10-CM | POA: Diagnosis not present

## 2020-07-04 DIAGNOSIS — D472 Monoclonal gammopathy: Secondary | ICD-10-CM | POA: Insufficient documentation

## 2020-07-04 DIAGNOSIS — Z8589 Personal history of malignant neoplasm of other organs and systems: Secondary | ICD-10-CM | POA: Insufficient documentation

## 2020-07-04 LAB — GLUCOSE, CAPILLARY: Glucose-Capillary: 80 mg/dL (ref 70–99)

## 2020-07-04 MED ORDER — FLUDEOXYGLUCOSE F - 18 (FDG) INJECTION
11.2000 | Freq: Once | INTRAVENOUS | Status: AC | PRN
  Administered 2020-07-04: 11.95 via INTRAVENOUS

## 2020-07-08 ENCOUNTER — Encounter: Payer: Self-pay | Admitting: Internal Medicine

## 2020-07-08 ENCOUNTER — Other Ambulatory Visit: Payer: Self-pay | Admitting: Student

## 2020-07-09 ENCOUNTER — Ambulatory Visit (HOSPITAL_COMMUNITY): Payer: 59

## 2020-07-09 ENCOUNTER — Ambulatory Visit (HOSPITAL_COMMUNITY)
Admission: RE | Admit: 2020-07-09 | Discharge: 2020-07-09 | Disposition: A | Discharge status: Home / Self Care | Payer: 59 | Source: Ambulatory Visit | Attending: Hematology | Admitting: Hematology

## 2020-07-09 ENCOUNTER — Other Ambulatory Visit: Payer: Self-pay

## 2020-07-09 ENCOUNTER — Encounter (HOSPITAL_COMMUNITY): Payer: Self-pay

## 2020-07-09 DIAGNOSIS — K760 Fatty (change of) liver, not elsewhere classified: Secondary | ICD-10-CM | POA: Diagnosis not present

## 2020-07-09 DIAGNOSIS — Z7901 Long term (current) use of anticoagulants: Secondary | ICD-10-CM | POA: Insufficient documentation

## 2020-07-09 DIAGNOSIS — I2510 Atherosclerotic heart disease of native coronary artery without angina pectoris: Secondary | ICD-10-CM | POA: Insufficient documentation

## 2020-07-09 DIAGNOSIS — Z7982 Long term (current) use of aspirin: Secondary | ICD-10-CM | POA: Insufficient documentation

## 2020-07-09 DIAGNOSIS — D539 Nutritional anemia, unspecified: Secondary | ICD-10-CM | POA: Diagnosis not present

## 2020-07-09 DIAGNOSIS — E669 Obesity, unspecified: Secondary | ICD-10-CM | POA: Insufficient documentation

## 2020-07-09 DIAGNOSIS — I10 Essential (primary) hypertension: Secondary | ICD-10-CM | POA: Diagnosis not present

## 2020-07-09 DIAGNOSIS — Z79899 Other long term (current) drug therapy: Secondary | ICD-10-CM | POA: Diagnosis not present

## 2020-07-09 DIAGNOSIS — I700 Atherosclerosis of aorta: Secondary | ICD-10-CM | POA: Diagnosis not present

## 2020-07-09 DIAGNOSIS — D47Z9 Other specified neoplasms of uncertain behavior of lymphoid, hematopoietic and related tissue: Secondary | ICD-10-CM | POA: Diagnosis not present

## 2020-07-09 DIAGNOSIS — D472 Monoclonal gammopathy: Secondary | ICD-10-CM | POA: Diagnosis not present

## 2020-07-09 LAB — CBC WITH DIFFERENTIAL/PLATELET
Abs Immature Granulocytes: 0.02 10*3/uL (ref 0.00–0.07)
Basophils Absolute: 0 10*3/uL (ref 0.0–0.1)
Basophils Relative: 0 %
Eosinophils Absolute: 0.1 10*3/uL (ref 0.0–0.5)
Eosinophils Relative: 1 %
HCT: 40.1 % (ref 39.0–52.0)
Hemoglobin: 12.8 g/dL — ABNORMAL LOW (ref 13.0–17.0)
Immature Granulocytes: 0 %
Lymphocytes Relative: 54 %
Lymphs Abs: 3.3 10*3/uL (ref 0.7–4.0)
MCH: 32.7 pg (ref 26.0–34.0)
MCHC: 31.9 g/dL (ref 30.0–36.0)
MCV: 102.3 fL — ABNORMAL HIGH (ref 80.0–100.0)
Monocytes Absolute: 0.7 10*3/uL (ref 0.1–1.0)
Monocytes Relative: 12 %
Neutro Abs: 2 10*3/uL (ref 1.7–7.7)
Neutrophils Relative %: 33 %
Platelets: 266 10*3/uL (ref 150–400)
RBC: 3.92 MIL/uL — ABNORMAL LOW (ref 4.22–5.81)
RDW: 13.2 % (ref 11.5–15.5)
WBC: 6.1 10*3/uL (ref 4.0–10.5)
nRBC: 0 % (ref 0.0–0.2)

## 2020-07-09 MED ORDER — FENTANYL CITRATE (PF) 100 MCG/2ML IJ SOLN
INTRAMUSCULAR | Status: AC
  Filled 2020-07-09: qty 2

## 2020-07-09 MED ORDER — FENTANYL CITRATE (PF) 100 MCG/2ML IJ SOLN
INTRAMUSCULAR | Status: AC | PRN
  Administered 2020-07-09 (×2): 50 ug via INTRAVENOUS

## 2020-07-09 MED ORDER — LIDOCAINE HCL (PF) 1 % IJ SOLN
INTRAMUSCULAR | Status: AC | PRN
  Administered 2020-07-09 (×2): 10 mL via INTRADERMAL

## 2020-07-09 MED ORDER — MIDAZOLAM HCL 2 MG/2ML IJ SOLN
INTRAMUSCULAR | Status: AC
  Filled 2020-07-09: qty 4

## 2020-07-09 MED ORDER — MIDAZOLAM HCL 2 MG/2ML IJ SOLN
INTRAMUSCULAR | Status: AC | PRN
  Administered 2020-07-09 (×4): 1 mg via INTRAVENOUS

## 2020-07-09 MED ORDER — SODIUM CHLORIDE 0.9 % IV SOLN: INTRAVENOUS | Status: DC

## 2020-07-09 NOTE — Procedures (Signed)
Pre-procedure Diagnosis: MGUS Post-procedure Diagnosis: Same  Technically successful CT guided bone marrow aspiration and biopsy of left iliac crest.   Complications: None Immediate  EBL: None  Signed: Sandi Mariscal Pager: [phone] 07/09/2020, 11:07 AM

## 2020-07-09 NOTE — Consult Note (Signed)
Chief Complaint: Patient was seen in consultation today for CT-guided bone marrow biopsy  Referring Physician(s): Katragadda,Sreedhar  Supervising Physician: Sandi Mariscal  Patient Status: [facility] - Out-pt  History of Present Illness: Eugene [name] is a 65 y.o. male with past medical history of coronary artery disease,  hypertension, obesity and MGUS.  He has undergone recent imaging work-up for back and leg pain with bony lytic lesions noted.  Recent PET scan has revealed:  1. Multiple hypermetabolic bony soft tissue lesions identified. Dominant lesions in the L5 vertebral body and posterior right acetabulum with an additional prominent lesion in the distal right femur. 2. Focal increased uptake in the right tonsillar region with associated soft tissue fullness on CT imaging. Direct visualization may be warranted. 3. Asymmetric uptake, right greater than left, associated with the anterior alveolar ridge/upper lip with no underlying soft tissue or bony abnormality. Finding is nonspecific. 4. Hepatic steatosis. 5.  Aortic Atherosclerois  He presents today for CT-guided bone marrow biopsy to rule out myeloma.    Past Medical History:  Diagnosis Date   Back pain    CAD (coronary artery disease)    a. 01/2017 NSTEMI/Cath: LM nl, LAD 25p - ? hypodense but no filling defect, RI nl, LCX nl, RCA nl, EF 55-65%-->Med Rx.   Erectile dysfunction    History of echocardiogram    a. 01/2017 Echo: EF 55-60%, no rwma.    Past Surgical History:  Procedure Laterality Date   LEFT HEART CATH AND CORONARY ANGIOGRAPHY N/A 01/15/2017   Procedure: LEFT HEART CATH AND CORONARY ANGIOGRAPHY;  Surgeon: Martinique, Peter M, MD;  Location: [facility];  Service: Cardiovascular;  Laterality: N/A;    Allergies: Patient has no known allergies.  Medications: Prior to Admission medications   Medication Sig Start Date End Date Taking? Authorizing Provider  aspirin 81 MG tablet Take 81 mg by mouth  daily.      [provider]  lisinopril (ZESTRIL) 2.5 MG tablet TAKE 1 TABLET BY MOUTH DAILY. 03/13/20 03/13/21  Arnoldo Lenis, MD  metoprolol succinate (TOPROL-XL) 25 MG 24 hr tablet TAKE 1 TABLET BY MOUTH DAILY. 03/13/20 03/13/21  Arnoldo Lenis, MD  Multiple Vitamin (MULTIVITAMIN) tablet Take 1 tablet by mouth daily.      [provider]  nitroGLYCERIN (NITROSTAT) 0.4 MG SL tablet Place 1 tablet (0.4 mg total) under the tongue every 5 (five) minutes x 3 doses as needed for chest pain. 01/17/17   Theora Gianotti, NP  rosuvastatin (CRESTOR) 40 MG tablet Take 1 tablet (40 mg total) by mouth daily. 12/07/18   Alycia Rossetti, MD  traMADol (ULTRAM) 50 MG tablet Take 1 tablet (50 mg total) by mouth every 6 (six) hours as needed. 06/26/20   Derek Jack, MD     Family History  Problem Relation Age of Onset   Cancer Mother        breast   Diabetes Mother    Diabetes Sister    Hypertension Sister    Cancer Brother    Mental illness Sister     Social History   Socioeconomic History   Marital status: Divorced    Spouse name: Not on file   Number of children: Not on file   Years of education: Not on file   Highest education level: Not on file  Occupational History   Not on file  Tobacco Use   Smoking status: Never   Smokeless tobacco: Never  Vaping Use   Vaping Use:  Never used  Substance and Sexual Activity   Alcohol use: No   Drug use: No   Sexual activity: Yes  Other Topics Concern   Not on file  Social History Narrative   Not on file   Social Determinants of Health   Financial Resource Strain: Low Risk    Difficulty of Paying Living Expenses: Not hard at all  Food Insecurity: No Food Insecurity   Worried About Charity fundraiser in the Last Year: Never true   [place] in the Last Year: Never true  Transportation Needs: No Transportation Needs   Lack of Transportation (Medical): No   Lack of Transportation (Non-Medical): No   Physical Activity: Insufficiently Active   Days of Exercise per Week: 2 days   Minutes of Exercise per Session: 10 min  Stress: No Stress Concern Present   Feeling of Stress : Not at all  Social Connections: Moderately Integrated   Frequency of Communication with Friends and Family: More than three times a week   Frequency of Social Gatherings with Friends and Family: More than three times a week   Attends Religious Services: 1 to 4 times per year   Active Member of [employer] or Organizations: No   Attends Music therapist: 1 to 4 times per year   Marital Status: Divorced      Review of Systems currently denies fever, headache, chest pain, dyspnea, cough, abdominal pain, back pain, nausea, vomiting or bleeding.  He does have some right thigh pain.  Vital Signs: Vitals:   07/09/20 0948  BP: 132/72  Pulse: (!) 58  Resp: 16  Temp: 99.1 F (37.3 C)  SpO2: 100%      Physical Exam awake, alert.  Chest clear to auscultation bilaterally.  Heart with regular rate and rhythm.  Abdomen soft, positive bowel sounds, nontender.  No lower extremity edema.  Imaging: NM PET Image Initial (PI) Whole Body  Result Date: 07/05/2020 CLINICAL DATA:  Initial treatment strategy for MGUS/multiple myeloma. EXAM: NUCLEAR MEDICINE PET WHOLE BODY TECHNIQUE: 12.0 mCi F-18 FDG was injected intravenously. Full-ring PET imaging was performed from the head to foot after the radiotracer. CT data was obtained and used for attenuation correction and anatomic localization. Fasting blood glucose: 80 mg/dl COMPARISON:  None. FINDINGS: Mediastinal blood pool activity: SUV max 3.1 HEAD/NECK: Increased uptake identified along the alveolar ridge/upper lip, right slightly more than left. No underlying soft tissue or bony lesion at this location. Asymmetric focal hypermetabolism in the right tonsillar region with asymmetric 14 mm nodular soft tissue visible on axial CT image 55 of series 3 this region demonstrates  SUV max = 5.4. Symmetric uptake noted in the base of tongue. Incidental CT findings: None. CHEST: No hypermetabolic mediastinal or hilar nodes. No suspicious pulmonary nodules on the CT scan. Incidental CT findings: Coronary artery calcification is evident. Prominent vascular anatomy posterior left lower lobe (141/3) raises the question of AVM or pulmonary artery aneurysm. ABDOMEN/PELVIS: No abnormal hypermetabolic activity within the liver, pancreas, adrenal glands, or spleen. No hypermetabolic lymph nodes in the abdomen or pelvis. Incidental CT findings: Diffuse low attenuation of liver parenchyma suggests fatty deposition. No abdominal aortic aneurysm. Only minimal diverticular disease noted in the left colon without diverticulitis. Prostate gland mildly enlarged. Bladder is nondistended, likely accentuating wall thickness. SKELETON: Multiple mildly hypermetabolic skeletal lesions are identified in the chest including right clavicular head ( SUV max = 3.9) and right humeral head ( SUV max = 3.9). 3.4 x 3.4  cm lytic soft tissue lesion in the right L5 vertebral body is hypermetabolic with SUV max = 6.6. 2.2 cm soft tissue lesion posterior right acetabulum (161/0) is hypermetabolic with SUV max = 9. Incidental CT findings: none EXTREMITIES: 2.3 cm soft tissue lesion in the medullary space of the distal right femur (345/3) demonstrates SUV max = 5.0. Incidental CT findings: none IMPRESSION: 1. Multiple hypermetabolic bony soft tissue lesions identified consistent with the reported history of multiple myeloma. Dominant lesions in the L5 vertebral body and posterior right acetabulum with an additional prominent lesion in the distal right femur. 2. Focal increased uptake in the right tonsillar region with associated soft tissue fullness on CT imaging. Direct visualization may be warranted. 3. Asymmetric uptake, right greater than left, associated with the anterior alveolar ridge/upper lip with no underlying soft tissue  or bony abnormality. Finding is nonspecific. 4. Hepatic steatosis. 5.  Aortic Atherosclerois (ICD10-170.0) Electronically Signed   By: Misty Stanley M.D.   On: 07/05/2020 11:30   DG Hip Unilat With Pelvis 2-3 Views Right  Result Date: 06/11/2020 CLINICAL DATA:  Back pain. EXAM: DG HIP (WITH OR WITHOUT PELVIS) 2-3V RIGHT COMPARISON:  10/19/2016. FINDINGS: Prominent lytic lesions are noted in the proximal and middle portions of the right femoral diaphysis. These findings suspicious for myeloma or metastatic disease. No acute bony or joint abnormality. No evidence of fracture dislocation. Degenerative changes lumbar spine and both hips. IMPRESSION: 1. Prominent lytic lesions noted the proximal midportion of the right femoral diaphysis. These findings are suspicious for myeloma or metastatic disease. 2.  No evidence of fracture or dislocation. Electronically Signed   By: Marcello Moores  Register   On: 06/11/2020 11:32   DG FEMUR, MIN 2 VIEWS RIGHT  Result Date: 06/26/2020 CLINICAL DATA:  MGUS, bone lesions, hip and femur pain EXAM: RIGHT FEMUR 2 VIEWS COMPARISON:  Right hip radiographs, 06/11/2020, right knee radiographs, 06/13/2019, radiographic bone survey, 10/22/2017 FINDINGS: Lytic lesions of the proximal right femoral metadiaphysis and mid femoral diaphysis are not significantly changed in appearance compared to prior radiographs dated 06/11/2020. No evident fracture. Included joint spaces are preserved. Soft tissues are unremarkable. IMPRESSION: Lytic lesions of the proximal right femoral metadiaphysis and mid femoral diaphysis are not significantly changed in appearance compared to prior radiographs dated 06/11/2020. No evident fracture. Electronically Signed   By: Eddie Candle M.D.   On: 06/26/2020 15:17    Labs:  CBC: Recent Labs    06/26/20 0904  WBC 6.4  HGB 10.7*  HCT 33.3*  PLT 252    COAGS: No results for input(s): INR, APTT in the last 8760 hours.  BMP: Recent Labs    06/26/20 0904   NA 134*  K 3.9  CL 101  CO2 27  GLUCOSE 96  BUN 25*  CALCIUM 8.9  CREATININE 1.10  GFRNONAA >60    LIVER FUNCTION TESTS: Recent Labs    06/26/20 0904  BILITOT 0.5  AST 27  ALT 22  ALKPHOS 65  PROT 8.7*  ALBUMIN 3.7    TUMOR MARKERS: No results for input(s): AFPTM, CEA, CA199, CHROMGRNA in the last 8760 hours.  Assessment and Plan: 65 y.o. male with past medical history of coronary artery disease,  hypertension, obesity and MGUS.  He has undergone recent imaging work-up for back and leg pain with bony lytic lesions noted.  Recent PET scan has revealed:  1. Multiple hypermetabolic bony soft tissue lesions identified. Dominant lesions in the L5 vertebral body and posterior right acetabulum with an  additional prominent lesion in the distal right femur. 2. Focal increased uptake in the right tonsillar region with associated soft tissue fullness on CT imaging. Direct visualization may be warranted. 3. Asymmetric uptake, right greater than left, associated with the anterior alveolar ridge/upper lip with no underlying soft tissue or bony abnormality. Finding is nonspecific. 4. Hepatic steatosis. 5.  Aortic Atherosclerois  He presents today for CT-guided bone marrow biopsy to rule out myeloma.Risks and benefits of procedure was discussed with the patient  including, but not limited to bleeding, infection, damage to adjacent structures or low yield requiring additional tests.  All of the questions were answered and there is agreement to proceed.  Consent signed and in chart.    Thank you for this interesting consult.  I greatly enjoyed meeting Eugene [name] and look forward to participating in their care.  A copy of this report was sent to the requesting provider on this date.  Electronically Signed: D. Rowe Robert, PA-C 07/09/2020, 9:45 AM   I spent a total of 20 Minutes    in face to face in clinical consultation, greater than 50% of which was counseling/coordinating  care for CT-guided bone marrow biopsy

## 2020-07-09 NOTE — Discharge Instructions (Signed)
For questions /concerns may call Interventional Radiology at [phone] ? ?You may remove your dressing and shower tomorrow afternoon ? ?Moderate Conscious Sedation, Adult, Care After ?This sheet gives you information about how to care for yourself after your procedure. Your health care provider may also give you more specific instructions. If you have problems or questions, contact your health careprovider. ?What can I expect after the procedure? ?After the procedure, it is common to have: ?Sleepiness for several hours. ?Impaired judgment for several hours. ?Difficulty with balance. ?Vomiting if you eat too soon. ?Follow these instructions at home: ?For the time period you were told by your health care provider: ?Rest. ?Do not participate in activities where you could fall or become injured. ?Do not drive or use machinery. ?Do not drink alcohol. ?Do not take sleeping pills or medicines that cause drowsiness. ?Do not make important decisions or sign legal documents. ?Do not take care of children on your own. ?Eating and drinking ? ?Follow the diet recommended by your health care provider. ?Drink enough fluid to keep your urine pale yellow. ?If you vomit: ?Drink water, juice, or soup when you can drink without vomiting. ?Make sure you have little or no nausea before eating solid foods. ? ?General instructions ?Take over-the-counter and prescription medicines only as told by your health care provider. ?Have a responsible adult stay with you for the time you are told. It is important to have someone help care for you until you are awake and alert. ?Do not smoke. ?Keep all follow-up visits as told by your health care provider. This is important. ?Contact a health care provider if: ?You are still sleepy or having trouble with balance after 24 hours. ?You feel light-headed. ?You keep feeling nauseous or you keep vomiting. ?You develop a rash. ?You have a fever. ?You have redness or swelling around the IV site. ?Get help  right away if: ?You have trouble breathing. ?You have new-onset confusion at home. ?Summary ?After the procedure, it is common to feel sleepy, have impaired judgment, or feel nauseous if you eat too soon. ?Rest after you get home. Know the things you should not do after the procedure. ?Follow the diet recommended by your health care provider and drink enough fluid to keep your urine pale yellow. ?Get help right away if you have trouble breathing or new-onset confusion at home. ?This information is not intended to replace advice given to you by your health care provider. Make sure you discuss any questions you have with your healthcare provider. ? ? ?Bone Marrow Aspiration and Bone Marrow Biopsy, Adult, Care After ?This sheet gives you information about how to care for yourself after your procedure. Your health care provider may also give you more specific instructions. If you have problems or questions, contact your health careprovider. ?What can I expect after the procedure? ?After the procedure, it is common to have: ?Mild pain and tenderness. ?Swelling. ?Bruising. ?Follow these instructions at home: ?Puncture site care ?Follow instructions from your health care provider about how to take care of the puncture site. Make sure you: ?Wash your hands with soap and water before and after you change your bandage (dressing). If soap and water are not available, use hand sanitizer. ?Change your dressing as told by your health care provider. ?Check your puncture site every day for signs of infection. Check for: ?More redness, swelling, or pain. ?Fluid or blood. ?Warmth. ?Pus or a bad smell. ? ?Activity ?Return to your normal activities as told by your health care   provider. Ask your health care provider what activities are safe for you. ?Do not lift anything that is heavier than 10 lb (4.5 kg), or the limit that you are told, until your health care provider says that it is safe. ?Do not drive for 24 hours if you were given  a sedative during your procedure. ?General instructions ?Take over-the-counter and prescription medicines only as told by your health care provider. ?Do not take baths, swim, or use a hot tub until your health care provider approves. Ask your health care provider if you may take showers. You may only be allowed to take sponge baths. ?If directed, put ice on the affected area. To do this: ?Put ice in a plastic bag. ?Place a towel between your skin and the bag. ?Leave the ice on for 20 minutes, 2-3 times a day. ?Keep all follow-up visits as told by your health care provider. This is important. ? ?Contact a health care provider if: ?Your pain is not controlled with medicine. ?You have a fever. ?You have more redness, swelling, or pain around the puncture site. ?You have fluid or blood coming from the puncture site. ?Your puncture site feels warm to the touch. ?You have pus or a bad smell coming from the puncture site. ?Summary ?After the procedure, it is common to have mild pain, tenderness, swelling, and bruising. ?Follow instructions from your health care provider about how to take care of the puncture site and what activities are safe for you. ?Take over-the-counter and prescription medicines only as told by your health care provider. ?Contact a health care provider if you have any signs of infection, such as fluid or blood coming from the puncture site. ?This information is not intended to replace advice given to you by your health care provider. Make sure you discuss any questions you have with your healthcare provider. ?Document Revised: 05/17/2018 Document Reviewed: 05/17/2018 ?Elsevier Patient Education ? 2022 [employer]  ?

## 2020-07-10 ENCOUNTER — Other Ambulatory Visit (HOSPITAL_COMMUNITY): Payer: Self-pay

## 2020-07-10 ENCOUNTER — Encounter: Payer: Self-pay | Admitting: Student

## 2020-07-10 ENCOUNTER — Ambulatory Visit: Payer: 59 | Admitting: Student

## 2020-07-10 VITALS — BP 114/76 | HR 57 | Ht 69.0 in | Wt 217.0 lb

## 2020-07-10 DIAGNOSIS — D472 Monoclonal gammopathy: Secondary | ICD-10-CM

## 2020-07-10 DIAGNOSIS — I10 Essential (primary) hypertension: Secondary | ICD-10-CM | POA: Diagnosis not present

## 2020-07-10 DIAGNOSIS — I2510 Atherosclerotic heart disease of native coronary artery without angina pectoris: Secondary | ICD-10-CM

## 2020-07-10 DIAGNOSIS — E785 Hyperlipidemia, unspecified: Secondary | ICD-10-CM

## 2020-07-10 MED ORDER — NITROGLYCERIN 0.4 MG SL SUBL
0.4000 mg | SUBLINGUAL_TABLET | SUBLINGUAL | 3 refills | Status: DC | PRN
Start: 2020-07-10 — End: 2022-07-29
  Filled 2020-07-10: qty 25, 15d supply, fill #0

## 2020-07-10 NOTE — Patient Instructions (Signed)
Medication Instructions:  Your physician has recommended you make the following change in your medication:   Stop Taking Lisinopril   *If you need a refill on your cardiac medications before your next appointment, please call your pharmacy*   Lab Work: NONE   If you have labs (blood work) drawn today and your tests are completely normal, you will receive your results only by: [facility] (if you have MyChart) OR A paper copy in the mail If you have any lab test that is abnormal or we need to change your treatment, we will call you to review the results.   Testing/Procedures: NONE    Follow-Up: At [facility], you and your health needs are our priority.  As part of our continuing mission to provide you with exceptional heart care, we have created designated Provider Care Teams.  These Care Teams include your primary Cardiologist (physician) and Advanced Practice Providers (APPs -  Physician Assistants and Nurse Practitioners) who all work together to provide you with the care you need, when you need it.  We recommend signing up for the patient portal called "MyChart".  Sign up information is provided on this After Visit Summary.  MyChart is used to connect with patients for Virtual Visits (Telemedicine).  Patients are able to view lab/test results, encounter notes, upcoming appointments, etc.  Non-urgent messages can be sent to your provider as well.   To learn more about what you can do with MyChart, go to [email or web address].    Your next appointment:   1 year(s)  The format for your next appointment:   In Person  Provider:   Carlyle Dolly, MD   Other Instructions Thank you for choosing [facility]!

## 2020-07-10 NOTE — Progress Notes (Signed)
 Cardiology Office Note    Date:  07/10/2020   ID:  Eugene [name], DOB 06/11/1955, MRN [id number]  PCP:  Patel, Rutwik K, MD  Cardiologist: Branch, Jonathan, MD    Chief Complaint  Patient presents with   Follow-up    Annual Visit     History of Present Illness:    Eugene [name] is a 65 y.o. male with past medical history of CAD (s/p NSTEMI in 01/2017 with cath showing 25% prox-LAD stenosis and no obstructive disease), HTN and HLD who presents to the office today for annual follow-up.  He was last examined by Dr. Branch in 06/2019 and denied any recent chest pain or dyspnea on exertion at that time. Recent FLP had shown his LDL was elevated to 91 and he was continued on Crestor 40 mg daily with plans to add Zetia 10 mg daily if LDL remained above goal.  By review of notes, he is being followed by Oncology and is undergoing work-up for multiple myeloma. He did have a bone marrow biopsy yesterday with pathology results pending at this time.   In talking with the patient today, he denies any recent exertional chest pain or dyspnea on exertion. Says his activity has been more limited given the pain along his upper right leg and as outlined above, imaging was suspicious for myeloma and he recently underwent a bone marrow biopsy. He denies any recent orthopnea, PND or pitting edema. Reports good compliance with his current medications.    Past Medical History:  Diagnosis Date   Back pain    CAD (coronary artery disease)    a. 01/2017 NSTEMI/Cath: LM nl, LAD 25p - ? hypodense but no filling defect, RI nl, LCX nl, RCA nl, EF 55-65%-->Med Rx.   Erectile dysfunction    History of echocardiogram    a. 01/2017 Echo: EF 55-60%, no rwma.    Past Surgical History:  Procedure Laterality Date   LEFT HEART CATH AND CORONARY ANGIOGRAPHY N/A 01/15/2017   Procedure: LEFT HEART CATH AND CORONARY ANGIOGRAPHY;  Surgeon: Jordan, Peter M, MD;  Location: [facility];  Service: Cardiovascular;   Laterality: N/A;    Current Medications: Outpatient Medications Prior to Visit  Medication Sig Dispense Refill   aspirin 81 MG tablet Take 81 mg by mouth daily.       metoprolol succinate (TOPROL-XL) 25 MG 24 hr tablet TAKE 1 TABLET BY MOUTH DAILY. 90 tablet 3   Multiple Vitamin (MULTIVITAMIN) tablet Take 1 tablet by mouth daily.       rosuvastatin (CRESTOR) 40 MG tablet Take 1 tablet (40 mg total) by mouth daily. 90 tablet 3   traMADol (ULTRAM) 50 MG tablet Take 1 tablet (50 mg total) by mouth every 6 (six) hours as needed. 60 tablet 0   lisinopril (ZESTRIL) 2.5 MG tablet TAKE 1 TABLET BY MOUTH DAILY. 90 tablet 3   nitroGLYCERIN (NITROSTAT) 0.4 MG SL tablet Place 1 tablet (0.4 mg total) under the tongue every 5 (five) minutes x 3 doses as needed for chest pain. 25 tablet 3   No facility-administered medications prior to visit.     Allergies:   Patient has no known allergies.   Social History   Socioeconomic History   Marital status: Divorced    Spouse name: Not on file   Number of children: Not on file   Years of education: Not on file   Highest education level: Not on file  Occupational History   Not on file  Tobacco   Use   Smoking status: Never   Smokeless tobacco: Never  Vaping Use   Vaping Use: Never used  Substance and Sexual Activity   Alcohol use: No   Drug use: No   Sexual activity: Yes  Other Topics Concern   Not on file  Social History Narrative   Not on file   Social Determinants of Health   Financial Resource Strain: Low Risk    Difficulty of Paying Living Expenses: Not hard at all  Food Insecurity: No Food Insecurity   Worried About Running Out of Food in the Last Year: Never true   Ran Out of Food in the Last Year: Never true  Transportation Needs: No Transportation Needs   Lack of Transportation (Medical): No   Lack of Transportation (Non-Medical): No  Physical Activity: Insufficiently Active   Days of Exercise per Week: 2 days   Minutes of  Exercise per Session: 10 min  Stress: No Stress Concern Present   Feeling of Stress : Not at all  Social Connections: Moderately Integrated   Frequency of Communication with Friends and Family: More than three times a week   Frequency of Social Gatherings with Friends and Family: More than three times a week   Attends Religious Services: 1 to 4 times per year   Active Member of Clubs or Organizations: No   Attends Club or Organization Meetings: 1 to 4 times per year   Marital Status: Divorced     Family History:  The patient's family history includes Cancer in his brother and mother; Diabetes in his mother and sister; Hypertension in his sister; Mental illness in his sister.   Review of Systems:    Please see the history of present illness.     All other systems reviewed and are otherwise negative except as noted above.   Physical Exam:    VS:  BP 114/76   Pulse (!) 57   Ht 5' 9" (1.753 m)   Wt 217 lb (98.4 kg)   SpO2 95%   BMI 32.05 kg/m    General: Well developed, well nourished,male appearing in no acute distress. Head: Normocephalic, atraumatic. Neck: No carotid bruits. JVD not elevated.  Lungs: Respirations regular and unlabored, without wheezes or rales.  Heart: Regular rate and rhythm. No S3 or S4.  No murmur, no rubs, or gallops appreciated. Abdomen: Appears non-distended. No obvious abdominal masses. Msk:  Strength and tone appear normal for age. No obvious joint deformities or effusions. Extremities: No clubbing or cyanosis. No pitting edema.  Distal pedal pulses are 2+ bilaterally. Neuro: Alert and oriented X 3. Moves all extremities spontaneously. No focal deficits noted. Psych:  Responds to questions appropriately with a normal affect. Skin: No rashes or lesions noted  Wt Readings from Last 3 Encounters:  07/10/20 217 lb (98.4 kg)  07/09/20 217 lb (98.4 kg)  07/02/20 217 lb 12.8 oz (98.8 kg)      Studies/Labs Reviewed:   EKG:  EKG is ordered today.  The  ekg ordered today demonstrates NSR, HR 64 with TWI along the inferior leads. Overall similar to prior tracings.   Recent Labs: 06/26/2020: ALT 22; BUN 25; Creatinine, Ser 1.10; Potassium 3.9; Sodium 134 07/09/2020: Hemoglobin 12.8; Platelets 266   Lipid Panel    Component Value Date/Time   CHOL 150 06/02/2019 0811   TRIG 51 06/02/2019 0811   HDL 46 06/02/2019 0811   CHOLHDL 3.3 06/02/2019 0811   VLDL 13 01/16/2017 0256   LDLCALC 91 06/02/2019 0811      Additional studies/ records that were reviewed today include:   Cardiac Catheterization: 01/2017 Prox LAD lesion is 25% stenosed. The left ventricular systolic function is normal. LV end diastolic pressure is normal. The left ventricular ejection fraction is 55-65% by visual estimate.   1. No significant obstructive CAD. Initial views were concerning for hypodensity in the proximal LAD but there was poor filling due to poor catheter engagement. When a guide catheter was used with better filling this was less with no filling defect noted. 2. Normal LV function 3. Normal LVEDP   Plan: medical therapy. Cycle cardiac enzymes and Ecg. Check Echo.   Echocardiogram: 01/2017 Study Conclusions   - Left ventricle: The cavity size was normal. Wall thickness was    normal. Systolic function was normal. The estimated ejection    fraction was in the range of 55% to 60%. Wall motion was normal;    there were no regional wall motion abnormalities. Left    ventricular diastolic function parameters were normal.  - Left atrium: The atrium was mildly dilated.  - Pulmonary arteries: PA peak pressure: 33 mm Hg (S).    Assessment:    1. Coronary artery disease involving native coronary artery of native heart without angina pectoris   2. Essential hypertension   3. Hyperlipidemia LDL goal <70   4. Gammopathy, monoclonal      Plan:   In order of problems listed above:  1. CAD - He is s/p NSTEMI in 01/2017 with cath showing 25% prox-LAD  stenosis and no obstructive disease. He denies any recent exertional chest pain or dyspnea on exertion.  - Continue ASA 46m daily, Toprol-XL 213mdaily and Crestor 4077maily. Will send in an updated Rx for his SL NTG.   2. HTN - BP is at 114/76 during today's visit and has been well-controlled at other office visits. He is currently on Toprol-XL 61m85mily and low-dose Lisinopril 2.5mg 39mly. Will stop Lisinopril for now and follow readings at his upcoming visits. I encouraged him to make us awKoreae if his BP starts to trend upwards as this can be added back if needed.   3. HLD - His LDL was at 91 in 05/2019 and he is scheduled for fasting labs next month. If LDL remains above goal of 70, could add Zetia as previously discussed. Continue Crestor 40mg 52my for now.   4. Concern for Multiple Myeloma - Being followed by Oncology and is s/p bone marrow biopsy with official pathology results pending.    Medication Adjustments/Labs and Tests Ordered: Current medicines are reviewed at length with the patient today.  Concerns regarding medicines are outlined above.  Medication changes, Labs and Tests ordered today are listed in the Patient Instructions below. Patient Instructions  Medication Instructions:  Your physician has recommended you make the following change in your medication:   Stop Taking Lisinopril   *If you need a refill on your cardiac medications before your next appointment, please call your pharmacy*   Lab Work: NONE   If you have labs (blood work) drawn today and your tests are completely normal, you will receive your results only by: [facility] MyChart) OR A paper copy in the mail If you have any lab test that is abnormal or we need to change your treatment, we will call you to review the results.   Testing/Procedures: NONE    Follow-Up: [facility].  As part of our continuing mission  to provide you  with exceptional heart care, we have created designated Provider Care Teams.  These Care Teams include your primary Cardiologist (physician) and Advanced Practice Providers (APPs -  Physician Assistants and Nurse Practitioners) who all work together to provide you with the care you need, when you need it.  We recommend signing up for the patient portal called "MyChart".  Sign up information is provided on this After Visit Summary.  MyChart is used to connect with patients for Virtual Visits (Telemedicine).  Patients are able to view lab/test results, encounter notes, upcoming appointments, etc.  Non-urgent messages can be sent to your provider as well.   To learn more about what you can do with MyChart, go to [email or web address].    Your next appointment:   1 year(s)  The format for your next appointment:   In Person  Provider:   Carlyle Dolly, MD   Other Instructions Thank you for choosing [facility]!     Signed, Erma Heritage, PA-C  07/10/2020 6:35 PM    [facility] [address] [place], [place] [address] Phone: [phone] Fax: [phone]

## 2020-07-11 LAB — SURGICAL PATHOLOGY

## 2020-07-12 ENCOUNTER — Other Ambulatory Visit (HOSPITAL_COMMUNITY): Payer: Self-pay

## 2020-07-16 DIAGNOSIS — Z7689 Persons encountering health services in other specified circumstances: Secondary | ICD-10-CM | POA: Diagnosis not present

## 2020-07-16 DIAGNOSIS — I2510 Atherosclerotic heart disease of native coronary artery without angina pectoris: Secondary | ICD-10-CM | POA: Diagnosis not present

## 2020-07-16 DIAGNOSIS — Z125 Encounter for screening for malignant neoplasm of prostate: Secondary | ICD-10-CM | POA: Diagnosis not present

## 2020-07-17 LAB — PSA: Prostate Specific Ag, Serum: 0.5 ng/mL (ref 0.0–4.0)

## 2020-07-17 LAB — TSH+FREE T4
Free T4: 0.97 ng/dL (ref 0.82–1.77)
TSH: 2.14 u[IU]/mL (ref 0.450–4.500)

## 2020-07-17 LAB — LIPID PANEL
Chol/HDL Ratio: 3.2 ratio (ref 0.0–5.0)
Cholesterol, Total: 148 mg/dL (ref 100–199)
HDL: 46 mg/dL (ref >39)
LDL Chol Calc (NIH): 92 mg/dL (ref 0–99)
Triglycerides: 48 mg/dL (ref 0–149)
VLDL Cholesterol Cal: 10 mg/dL (ref 5–40)

## 2020-07-17 LAB — HEMOGLOBIN A1C
Est. average glucose Bld gHb Est-mCnc: 108 mg/dL
Hgb A1c MFr Bld: 5.4 % (ref 4.8–5.6)

## 2020-07-18 ENCOUNTER — Ambulatory Visit (HOSPITAL_COMMUNITY): Payer: 59 | Admitting: Hematology

## 2020-07-18 ENCOUNTER — Encounter (HOSPITAL_COMMUNITY): Payer: Self-pay | Admitting: Hematology

## 2020-07-19 ENCOUNTER — Encounter (HOSPITAL_COMMUNITY): Payer: Self-pay | Admitting: Hematology

## 2020-07-19 LAB — SURGICAL PATHOLOGY

## 2020-07-23 ENCOUNTER — Ambulatory Visit (HOSPITAL_COMMUNITY): Payer: 59 | Admitting: Hematology and Oncology

## 2020-07-23 ENCOUNTER — Encounter: Payer: 59 | Admitting: Internal Medicine

## 2020-07-24 ENCOUNTER — Other Ambulatory Visit (HOSPITAL_COMMUNITY): Payer: Self-pay

## 2020-07-24 ENCOUNTER — Encounter (HOSPITAL_COMMUNITY): Payer: Self-pay | Admitting: Hematology and Oncology

## 2020-07-24 ENCOUNTER — Inpatient Hospital Stay (HOSPITAL_COMMUNITY): Payer: 59 | Attending: Hematology | Admitting: Hematology and Oncology

## 2020-07-24 ENCOUNTER — Other Ambulatory Visit: Payer: Self-pay

## 2020-07-24 VITALS — BP 134/71 | HR 71 | Temp 97.2°F | Resp 18 | Wt 217.0 lb

## 2020-07-24 DIAGNOSIS — Z7952 Long term (current) use of systemic steroids: Secondary | ICD-10-CM | POA: Insufficient documentation

## 2020-07-24 DIAGNOSIS — C9000 Multiple myeloma not having achieved remission: Secondary | ICD-10-CM

## 2020-07-24 DIAGNOSIS — M859 Disorder of bone density and structure, unspecified: Secondary | ICD-10-CM | POA: Diagnosis present

## 2020-07-24 DIAGNOSIS — K760 Fatty (change of) liver, not elsewhere classified: Secondary | ICD-10-CM | POA: Diagnosis not present

## 2020-07-24 DIAGNOSIS — Z7982 Long term (current) use of aspirin: Secondary | ICD-10-CM | POA: Diagnosis not present

## 2020-07-24 DIAGNOSIS — Z79899 Other long term (current) drug therapy: Secondary | ICD-10-CM | POA: Insufficient documentation

## 2020-07-24 MED ORDER — PROCHLORPERAZINE MALEATE 10 MG PO TABS: 10.0000 mg | ORAL_TABLET | Freq: Four times a day (QID) | ORAL | 1 refills | Status: DC | PRN

## 2020-07-24 MED ORDER — LENALIDOMIDE 10 MG PO CAPS: 10.0000 mg | ORAL_CAPSULE | Freq: Every day | ORAL | 0 refills | Status: DC

## 2020-07-24 MED ORDER — ONDANSETRON HCL 8 MG PO TABS: 8.0000 mg | ORAL_TABLET | Freq: Two times a day (BID) | ORAL | 1 refills | Status: DC | PRN

## 2020-07-24 MED ORDER — DEXAMETHASONE 4 MG PO TABS: 4.0000 mg | ORAL_TABLET | Freq: Every day | ORAL | 0 refills | Status: DC

## 2020-07-24 MED ORDER — ACYCLOVIR 400 MG PO TABS: 400.0000 mg | ORAL_TABLET | Freq: Two times a day (BID) | ORAL | 5 refills | Status: DC

## 2020-07-24 NOTE — Assessment & Plan Note (Signed)
I have reviewed results of the bone marrow biopsy and imaging study I am very concerned about his bone pain and abnormal imaging study I will attempt to contact his orthopedic surgeon tomorrow to see if prophylactic surgery is indicated In the meantime, I will start him on dexamethasone We reviewed the plan of care He will need to start induction chemotherapy I recommend referral to [facility] bone marrow transplant team for future consideration for autologous stem cell transplant He will see a dentist for dental clearance before we start him on Zometa We discussed the risk, benefits, side effects of treatment with daratumumab, Velcade, Revlimid and dexamethasone I will start him on acyclovir for antimicrobial prophylaxis He will need to continue aspirin therapy for DVT prophylaxis I recommend calcium with vitamin D supplement We will schedule chemo education class and tentatively will start him on treatment within the next 2 weeks I gave him a letter to avoid jury duty due to his disease and ongoing treatment

## 2020-07-24 NOTE — Progress Notes (Signed)
START ON PATHWAY REGIMEN - Multiple Myeloma and Other Plasma Cell Dyscrasias   DaraVRd (Daratumumab SUBQ + Bortezomib SUBQ + Lenalidomide PO + Dexamethasone IV/PO) q21 Days (Induction Schema):   A cycle is every 21 days:     Lenalidomide      Dexamethasone      Bortezomib      Daratumumab and hyaluronidase-fihj   **Always confirm dose/schedule in your pharmacy ordering system**  DaraVRd (Daratumumab SUBQ + Bortezomib SUBQ + Lenalidomide PO + Dexamethasone IV/PO) q21 Days (Consolidation Schema):   A cycle is every 21 days:     Lenalidomide      Dexamethasone      Bortezomib      Daratumumab and hyaluronidase-fihj   **Always confirm dose/schedule in your pharmacy ordering system**  Patient Characteristics: Multiple Myeloma, Newly Diagnosed, Transplant Eligible, Standard Risk Disease Classification: Multiple Myeloma R-ISS Staging: III Therapeutic Status: Newly Diagnosed Is Patient Eligible for Transplant<= Transplant Eligible Risk Status: Standard Risk Intent of Therapy: Non-Curative / Palliative Intent, Discussed with Patient

## 2020-07-24 NOTE — Progress Notes (Signed)
[facility] FOLLOW-UP progress notes  Patient Care Team: Patel, Rutwik K, MD as PCP - General (Internal Medicine) Branch, Jonathan F, MD as PCP - Cardiology (Cardiology) Carver, Charles K, DO as Consulting Physician (Internal Medicine) Edwards, Morgan P, RN as Oncology Nurse Navigator (Oncology)  CHIEF COMPLAINTS/PURPOSE OF VISIT:  Multiple myeloma, for treatment  HISTORY OF PRESENTING ILLNESS:  Eugene [name] 65 y.o. male is seen because his primary oncologist is not available He is here accompanied by his wife He complained of severe pain on the right leg He has completed recent evaluation for multiple myeloma I reviewed bone marrow test results and PET CT scan as well as imaging study with the patient  I reviewed the patient's records extensive and collaborated the history with the patient. Summary of his history is as follows: Oncology History  Multiple myeloma without remission (HCC)  07/09/2020 Pathology Results   BONE MARROW, ASPIRATE, CLOT, CORE:  -Hypercellular bone marrow with plasma cell neoplasm  -See comment   PERIPHERAL BLOOD:  -Slight macrocytic anemia   COMMENT:   The bone marrow is hypercellular for age with trilineage hematopoiesis and nonspecific myeloid changes.  In this background, the plasma cells are increased in number representing 10% of all cells associated with atypical cytomorphologic features.  The plasma cells display kappa light chain restriction consistent with plasma cell neoplasm.  Correlation with cytogenetic and FISH studies is recommended.    07/24/2020 Initial Diagnosis   Multiple myeloma without remission (HCC)    07/24/2020 Cancer Staging   Staging form: Plasma Cell Myeloma and Plasma Cell Disorders, AJCC 8th Edition - Clinical stage from 07/24/2020: RISS Stage I (Beta-2-microglobulin (mg/L): 2.1, Albumin (g/dL): 3.7, ISS: Stage I, High-risk cytogenetics: Absent, LDH: Normal) - Signed by Gorsuch, Ni, MD on 07/24/2020  Stage  prefix: Initial diagnosis  Beta 2 microglobulin range (mg/L): Less than 3.5  Albumin range (g/dL): Greater than or equal to 3.5  Cytogenetics: No abnormalities    08/07/2020 -  Chemotherapy    Patient is on Treatment Plan: MYELOMA NEWLY DIAGNOSED TRANSPLANT CANDIDATE DARAVRD (DARATUMUMAB SQ) Q21D X 6 CYCLES (INDUCTION/CONSOLIDATION)         MEDICAL HISTORY:  Past Medical History:  Diagnosis Date   Back pain    CAD (coronary artery disease)    a. 01/2017 NSTEMI/Cath: LM nl, LAD 25p - ? hypodense but no filling defect, RI nl, LCX nl, RCA nl, EF 55-65%-->Med Rx.   Erectile dysfunction    History of echocardiogram    a. 01/2017 Echo: EF 55-60%, no rwma.    SURGICAL HISTORY: Past Surgical History:  Procedure Laterality Date   LEFT HEART CATH AND CORONARY ANGIOGRAPHY N/A 01/15/2017   Procedure: LEFT HEART CATH AND CORONARY ANGIOGRAPHY;  Surgeon: Jordan, Peter M, MD;  Location: [facility];  Service: Cardiovascular;  Laterality: N/A;    SOCIAL HISTORY: Social History   Socioeconomic History   Marital status: Divorced    Spouse name: Not on file   Number of children: Not on file   Years of education: Not on file   Highest education level: Not on file  Occupational History   Not on file  Tobacco Use   Smoking status: Never   Smokeless tobacco: Never  Vaping Use   Vaping Use: Never used  Substance and Sexual Activity   Alcohol use: No   Drug use: No   Sexual activity: Yes  Other Topics Concern   Not on file  Social History Narrative   Not on   file   Social Determinants of Health   Financial Resource Strain: Low Risk    Difficulty of Paying Living Expenses: Not hard at all  Food Insecurity: No Food Insecurity   Worried About Charity fundraiser in the Last Year: Never true   Arboriculturist in the Last Year: Never true  Transportation Needs: No Transportation Needs   Lack of Transportation (Medical): No   Lack of Transportation (Non-Medical): No  Physical  Activity: Insufficiently Active   Days of Exercise per Week: 2 days   Minutes of Exercise per Session: 10 min  Stress: No Stress Concern Present   Feeling of Stress : Not at all  Social Connections: Moderately Integrated   Frequency of Communication with Friends and Family: More than three times a week   Frequency of Social Gatherings with Friends and Family: More than three times a week   Attends Religious Services: 1 to 4 times per year   Active Member of [employer] or Organizations: No   Attends Music therapist: 1 to 4 times per year   Marital Status: Divorced  Human resources officer Violence: Not At Risk   Fear of Current or Ex-Partner: No   Emotionally Abused: No   Physically Abused: No   Sexually Abused: No    FAMILY HISTORY: Family History  Problem Relation Age of Onset   Cancer Mother        breast   Diabetes Mother    Diabetes Sister    Hypertension Sister    Cancer Brother    Mental illness Sister     ALLERGIES:  has No Known Allergies.  MEDICATIONS:  Current Outpatient Medications  Medication Sig Dispense Refill   aspirin 81 MG tablet Take 81 mg by mouth daily.       dexamethasone (DECADRON) 4 MG tablet Take 1 tablet (4 mg total) by mouth daily. 30 tablet 0   metoprolol succinate (TOPROL-XL) 25 MG 24 hr tablet TAKE 1 TABLET BY MOUTH DAILY. 90 tablet 3   Multiple Vitamin (MULTIVITAMIN) tablet Take 1 tablet by mouth daily.       rosuvastatin (CRESTOR) 40 MG tablet Take 1 tablet (40 mg total) by mouth daily. 90 tablet 3   traMADol (ULTRAM) 50 MG tablet Take 1 tablet (50 mg total) by mouth every 6 (six) hours as needed. 60 tablet 0   vitamin B-12 (CYANOCOBALAMIN) 1000 MCG tablet Take 1,000 mcg by mouth daily.     acyclovir (ZOVIRAX) 400 MG tablet Take 1 tablet (400 mg total) by mouth 2 (two) times daily. 60 tablet 5   lenalidomide (REVLIMID) 10 MG capsule Take 1 capsule (10 mg total) by mouth daily. Take for 14 days on, 7 days off, repeat every 21 days x 6 14  capsule 0   nitroGLYCERIN (NITROSTAT) 0.4 MG SL tablet Place 1 tablet (0.4 mg total) under the tongue every 5 (five) minutes for 3 doses as needed for chest pain. (Patient not taking: Reported on 07/24/2020) 25 tablet 3   ondansetron (ZOFRAN) 8 MG tablet Take 1 tablet (8 mg total) by mouth 2 (two) times daily as needed (Nausea or vomiting). 30 tablet 1   prochlorperazine (COMPAZINE) 10 MG tablet Take 1 tablet (10 mg total) by mouth every 6 (six) hours as needed (Nausea or vomiting). 30 tablet 1   No current facility-administered medications for this visit.    REVIEW OF SYSTEMS:   Constitutional: Denies fevers, chills or abnormal night sweats Eyes: Denies blurriness of vision, double  vision or watery eyes Ears, nose, mouth, throat, and face: Denies mucositis or sore throat Respiratory: Denies cough, dyspnea or wheezes Cardiovascular: Denies palpitation, chest discomfort or lower extremity swelling Gastrointestinal:  Denies nausea, heartburn or change in bowel habits Skin: Denies abnormal skin rashes Lymphatics: Denies new lymphadenopathy or easy bruising Neurological:Denies numbness, tingling or new weaknesses Behavioral/Psych: Mood is stable, no new changes  All other systems were reviewed with the patient and are negative.  PHYSICAL EXAMINATION: ECOG PERFORMANCE STATUS: 1 - Symptomatic but completely ambulatory  Vitals:   07/24/20 1328  BP: 134/71  Pulse: 71  Resp: 18  Temp: (!) 97.2 F (36.2 C)  SpO2: 97%   Filed Weights   07/24/20 1328  Weight: 217 lb (98.4 kg)    GENERAL:alert, no distress and comfortable SKIN: skin color, texture, turgor are normal, no rashes or significant lesions EYES: normal, conjunctiva are pink and non-injected, sclera clear OROPHARYNX:no exudate, normal lips, buccal mucosa, and tongue  NECK: supple, thyroid normal size, non-tender, without nodularity LYMPH:  no palpable lymphadenopathy in the cervical, axillary or inguinal LUNGS: clear to  auscultation and percussion with normal breathing effort HEART: regular rate & rhythm and no murmurs without lower extremity edema ABDOMEN:abdomen soft, non-tender and normal bowel sounds Musculoskeletal:no cyanosis of digits and no clubbing  PSYCH: alert & oriented x 3 with fluent speech NEURO: no focal motor/sensory deficits  LABORATORY DATA:  I have reviewed the data as listed Lab Results  Component Value Date   WBC 6.1 07/09/2020   HGB 12.8 (L) 07/09/2020   HCT 40.1 07/09/2020   MCV 102.3 (H) 07/09/2020   PLT 266 07/09/2020   Recent Labs    06/26/20 0904  NA 134*  K 3.9  CL 101  CO2 27  GLUCOSE 96  BUN 25*  CREATININE 1.10  CALCIUM 8.9  GFRNONAA >60  PROT 8.7*  ALBUMIN 3.7  AST 27  ALT 22  ALKPHOS 65  BILITOT 0.5    RADIOGRAPHIC STUDIES: I have personally reviewed the radiological images as listed and agreed with the findings in the report. NM PET Image Initial (PI) Whole Body  Result Date: 07/05/2020 CLINICAL DATA:  Initial treatment strategy for MGUS/multiple myeloma. EXAM: NUCLEAR MEDICINE PET WHOLE BODY TECHNIQUE: 12.0 mCi F-18 FDG was injected intravenously. Full-ring PET imaging was performed from the head to foot after the radiotracer. CT data was obtained and used for attenuation correction and anatomic localization. Fasting blood glucose: 80 mg/dl COMPARISON:  None. FINDINGS: Mediastinal blood pool activity: SUV max 3.1 HEAD/NECK: Increased uptake identified along the alveolar ridge/upper lip, right slightly more than left. No underlying soft tissue or bony lesion at this location. Asymmetric focal hypermetabolism in the right tonsillar region with asymmetric 14 mm nodular soft tissue visible on axial CT image 55 of series 3 this region demonstrates SUV max = 5.4. Symmetric uptake noted in the base of tongue. Incidental CT findings: None. CHEST: No hypermetabolic mediastinal or hilar nodes. No suspicious pulmonary nodules on the CT scan. Incidental CT findings:  Coronary artery calcification is evident. Prominent vascular anatomy posterior left lower lobe (141/3) raises the question of AVM or pulmonary artery aneurysm. ABDOMEN/PELVIS: No abnormal hypermetabolic activity within the liver, pancreas, adrenal glands, or spleen. No hypermetabolic lymph nodes in the abdomen or pelvis. Incidental CT findings: Diffuse low attenuation of liver parenchyma suggests fatty deposition. No abdominal aortic aneurysm. Only minimal diverticular disease noted in the left colon without diverticulitis. Prostate gland mildly enlarged. Bladder is nondistended, likely accentuating wall thickness.  SKELETON: Multiple mildly hypermetabolic skeletal lesions are identified in the chest including right clavicular head ( SUV max = 3.9) and right humeral head ( SUV max = 3.9). 3.4 x 3.4 cm lytic soft tissue lesion in the right L5 vertebral body is hypermetabolic with SUV max = 6.6. 2.2 cm soft tissue lesion posterior right acetabulum (295/7) is hypermetabolic with SUV max = 9. Incidental CT findings: none EXTREMITIES: 2.3 cm soft tissue lesion in the medullary space of the distal right femur (345/3) demonstrates SUV max = 5.0. Incidental CT findings: none IMPRESSION: 1. Multiple hypermetabolic bony soft tissue lesions identified consistent with the reported history of multiple myeloma. Dominant lesions in the L5 vertebral body and posterior right acetabulum with an additional prominent lesion in the distal right femur. 2. Focal increased uptake in the right tonsillar region with associated soft tissue fullness on CT imaging. Direct visualization may be warranted. 3. Asymmetric uptake, right greater than left, associated with the anterior alveolar ridge/upper lip with no underlying soft tissue or bony abnormality. Finding is nonspecific. 4. Hepatic steatosis. 5.  Aortic Atherosclerois (ICD10-170.0) Electronically Signed   By: Misty Stanley M.D.   On: 07/05/2020 11:30   CT Biopsy  Result Date:  07/09/2020 INDICATION: MGUS. Please perform CT-guided bone marrow biopsy for diagnostic purposes. EXAM: CT-GUIDED BONE MARROW BIOPSY AND ASPIRATION MEDICATIONS: None ANESTHESIA/SEDATION: Fentanyl 100 mcg IV; Versed 4 mg IV Sedation Time: 10 Minutes; The patient was continuously monitored during the procedure by the interventional radiology nurse under my direct supervision. COMPLICATIONS: None immediate. PROCEDURE: Informed consent was obtained from the patient following an explanation of the procedure, risks, benefits and alternatives. The patient understands, agrees and consents for the procedure. All questions were addressed. A time out was performed prior to the initiation of the procedure. The patient was positioned prone and non-contrast localization CT was performed of the pelvis to demonstrate the iliac marrow spaces. The operative site was prepped and draped in the usual sterile fashion. Under sterile conditions and local anesthesia, a 22 gauge spinal needle was utilized for procedural planning. Next, an 11 gauge coaxial bone biopsy needle was advanced into the left iliac marrow space. Needle position was confirmed with CT imaging. Initially, a bone marrow aspiration was performed. Next, a bone marrow biopsy was obtained with the 11 gauge outer bone marrow device. Samples were prepared with the cytotechnologist and deemed adequate. The needle was removed and superficial hemostasis was obtained with manual compression. A dressing was applied. The patient tolerated the procedure well without immediate post procedural complication. IMPRESSION: Successful CT guided left iliac bone marrow aspiration and core biopsy. Electronically Signed   By: Sandi Mariscal M.D.   On: 07/09/2020 12:18   CT BONE MARROW BIOPSY & ASPIRATION  Result Date: 07/09/2020 INDICATION: MGUS. Please perform CT-guided bone marrow biopsy for diagnostic purposes. EXAM: CT-GUIDED BONE MARROW BIOPSY AND ASPIRATION MEDICATIONS: None  ANESTHESIA/SEDATION: Fentanyl 100 mcg IV; Versed 4 mg IV Sedation Time: 10 Minutes; The patient was continuously monitored during the procedure by the interventional radiology nurse under my direct supervision. COMPLICATIONS: None immediate. PROCEDURE: Informed consent was obtained from the patient following an explanation of the procedure, risks, benefits and alternatives. The patient understands, agrees and consents for the procedure. All questions were addressed. A time out was performed prior to the initiation of the procedure. The patient was positioned prone and non-contrast localization CT was performed of the pelvis to demonstrate the iliac marrow spaces. The operative site was prepped and draped in the  usual sterile fashion. Under sterile conditions and local anesthesia, a 22 gauge spinal needle was utilized for procedural planning. Next, an 11 gauge coaxial bone biopsy needle was advanced into the left iliac marrow space. Needle position was confirmed with CT imaging. Initially, a bone marrow aspiration was performed. Next, a bone marrow biopsy was obtained with the 11 gauge outer bone marrow device. Samples were prepared with the cytotechnologist and deemed adequate. The needle was removed and superficial hemostasis was obtained with manual compression. A dressing was applied. The patient tolerated the procedure well without immediate post procedural complication. IMPRESSION: Successful CT guided left iliac bone marrow aspiration and core biopsy. Electronically Signed   By: John  Watts M.D.   On: 07/09/2020 12:18   DG FEMUR, MIN 2 VIEWS RIGHT  Result Date: 06/26/2020 CLINICAL DATA:  MGUS, bone lesions, hip and femur pain EXAM: RIGHT FEMUR 2 VIEWS COMPARISON:  Right hip radiographs, 06/11/2020, right knee radiographs, 06/13/2019, radiographic bone survey, 10/22/2017 FINDINGS: Lytic lesions of the proximal right femoral metadiaphysis and mid femoral diaphysis are not significantly changed in appearance  compared to prior radiographs dated 06/11/2020. No evident fracture. Included joint spaces are preserved. Soft tissues are unremarkable. IMPRESSION: Lytic lesions of the proximal right femoral metadiaphysis and mid femoral diaphysis are not significantly changed in appearance compared to prior radiographs dated 06/11/2020. No evident fracture. Electronically Signed   By: Alex  Bibbey M.D.   On: 06/26/2020 15:17    ASSESSMENT & PLAN:  Multiple myeloma without remission (HCC) I have reviewed results of the bone marrow biopsy and imaging study I am very concerned about his bone pain and abnormal imaging study I will attempt to contact his orthopedic surgeon tomorrow to see if prophylactic surgery is indicated In the meantime, I will start him on dexamethasone We reviewed the plan of care He will need to start induction chemotherapy I recommend referral to [facility] bone marrow transplant team for future consideration for autologous stem cell transplant He will see a dentist for dental clearance before we start him on Zometa We discussed the risk, benefits, side effects of treatment with daratumumab, Velcade, Revlimid and dexamethasone I will start him on acyclovir for antimicrobial prophylaxis He will need to continue aspirin therapy for DVT prophylaxis I recommend calcium with vitamin D supplement We will schedule chemo education class and tentatively will start him on treatment within the next 2 weeks I gave him a letter to avoid jury duty due to his disease and ongoing treatment  Orders Placed This Encounter  Procedures   CBC with Differential    Standing Status:   Standing    Number of Occurrences:   20    Standing Expiration Date:   07/24/2021   Comprehensive metabolic panel    Standing Status:   Standing    Number of Occurrences:   20    Standing Expiration Date:   07/24/2021   Pretreatment RBC phenotype    Obtain prior to daratumumab treatment.    Standing Status:   Future    Standing  Expiration Date:   07/24/2021    Order Specific Question:   Medication to be given:    Answer:   Daratumumab   CBC with Differential    Standing Status:   Standing    Number of Occurrences:   50    Standing Expiration Date:   07/24/2021   Comprehensive metabolic panel    Standing Status:   Standing    Number of Occurrences:   50      Standing Expiration Date:   07/24/2021   Magnesium    Standing Status:   Standing    Number of Occurrences:   50    Standing Expiration Date:   07/24/2021   Lactate dehydrogenase    Standing Status:   Standing    Number of Occurrences:   50    Standing Expiration Date:   07/24/2021   Kappa/lambda light chains    Standing Status:   Standing    Number of Occurrences:   50    Standing Expiration Date:   07/24/2021   Protein electrophoresis, serum    Standing Status:   Standing    Number of Occurrences:   50    Standing Expiration Date:   07/24/2021   PHYSICIAN COMMUNICATION ORDER    Dexamethasone 20 mg PO to be given in infusion prior to Darzalex Faspro. Remainder is given at home on the day after Darzalex Faspro. Dexamethasone could be reduced to 20 mg per week, given prior to daratumumab, after induction cycle 4.  In this trial, all patients received 4 cycles of induction with DaraVRd prior to stem cell harvest, 2 cycles of consolidation, given 60-100 days post-autologous stem cell transplant, and up to two years of maintenance therapy with Daratumumab + Lenalidomide. Maintenance began within 14 days after a post-consolidation disease evaluation. The pathways committee acknowledges that additional cycles of therapy may be needed prior to transplant in clinical practice based on response and transplant logistics. If continuing beyond four cycles prior to transplant, the committee recommends following the GRIFFIN consolidation daratumumab dosing schema. See Pathway [id number] for SQ maintenance.   Type and screen    Obtain prior to daratumumab treatment.    Standing  Status:   Future    Standing Expiration Date:   07/24/2021    All questions were answered. The patient knows to call the clinic with any problems, questions or concerns. The total time spent in the appointment was 40 minutes encounter with patients including review of chart and various tests results, discussions about plan of care and coordination of care plan   Ni Gorsuch, MD 07/24/2020 6:43 PM    

## 2020-07-25 ENCOUNTER — Telehealth (HOSPITAL_COMMUNITY): Payer: Self-pay | Admitting: Pharmacist

## 2020-07-25 ENCOUNTER — Telehealth: Payer: Self-pay | Admitting: Hematology and Oncology

## 2020-07-25 ENCOUNTER — Telehealth (HOSPITAL_COMMUNITY): Payer: Self-pay | Admitting: Pharmacy Technician

## 2020-07-25 ENCOUNTER — Telehealth: Payer: Self-pay

## 2020-07-25 ENCOUNTER — Other Ambulatory Visit: Payer: Self-pay

## 2020-07-25 DIAGNOSIS — C9000 Multiple myeloma not having achieved remission: Secondary | ICD-10-CM

## 2020-07-25 NOTE — Telephone Encounter (Signed)
I have reviewed his case with Dr. Luna Glasgow, the orthopedic surgeon who saw him last year Dr. Luna Glasgow recommended tertiary referral to either [facility] or [facility] to see an orthopedic oncologist to consider prophylactic surgery to the right femur due to high risk of pathological fracture I will refer him urgently to Dr. Magda Bernheim at [facility]

## 2020-07-25 NOTE — Progress Notes (Signed)
Urgent referral placed to Dr. Emmit Pomfret office for multiple myeloma high risk for pathological fracture to right femur.   Appointment confirmed for 8/2 @ 10:45, however, referral department will review case due to urgency and contact clinic back today with updates.   RN successfully faxed referral/progress notes to [phone].

## 2020-07-25 NOTE — Telephone Encounter (Signed)
Oral Oncology Pharmacist Encounter  Received new prescription for Revlimid (lenalidomide) for the treatment of newly diagnosed multiple myeloma in conjunction with daratumumab, bortezomib, and dexamethasone, planned duration until disease control or unacceptable drug toxicity. Planned start on 08/07/20 along with his infusion appt.  CMP from 06/26/20 assessed, no relevant lab abnormalities. Prescription dose and frequency assessed. MD starting patient on a low dose and will increase as tolerated.   Current medication list in Epic reviewed, no DDIs with lenalidomide identified.  Evaluated chart and no patient barriers to medication adherence identified.   Prescription has been e-scribed to [facility] for benefits analysis and approval.  Oral Oncology Clinic will continue to follow for insurance authorization, copayment issues, initial counseling and start date.   Darl Pikes, PharmD, BCPS, BCOP, CPP Hematology/Oncology Clinical Pharmacist Practitioner [facility] [phone]  07/25/2020 11:32 AM

## 2020-07-25 NOTE — Telephone Encounter (Signed)
Received call from [facility] Dr. Dois Davenport office.  Patient is scheduled for 7/21 @ 1040 am.  Address is [address] [place], [place] [address] -  Pt notified.    Request place for imaging to be powered shared.

## 2020-07-25 NOTE — Telephone Encounter (Signed)
Call received from [facility] requesting Dr. Luna Glasgow to call Dr. Alvy Bimler, regarding a large bone lesion the pt has. Pt has a history of multiple myeloma and was seen by Dr. Luna Glasgow 07/04/19. Called Dr. Luna Glasgow and gave him patient information and Dr. Calton Dach #.

## 2020-07-25 NOTE — Telephone Encounter (Signed)
Oral Oncology Patient Advocate Encounter   Received notification from [employer] that prior authorization for Revlimid is required.   PA submitted on CoverMyMeds Key [id number]  Status is pending   Oral Oncology Clinic will continue to follow.  [place] Patient [place] Phone [phone] Fax [phone] 07/25/2020 9:04 AM

## 2020-07-30 NOTE — Telephone Encounter (Signed)
Oral Oncology Patient Advocate Encounter  Prior Authorization for Revlimid has been approved.    PA# [id number] Effective dates: 07/25/20 through 07/24/21.  12 fills authorized.  Oral Oncology Clinic will continue to follow.   [place] Patient [place] Phone [phone] Fax [phone] 07/30/2020 9:39 AM

## 2020-07-30 NOTE — Telephone Encounter (Signed)
Patient must use [facility].

## 2020-08-01 ENCOUNTER — Other Ambulatory Visit: Payer: Self-pay | Admitting: Hematology and Oncology

## 2020-08-01 ENCOUNTER — Telehealth: Payer: Self-pay | Admitting: Hematology and Oncology

## 2020-08-01 DIAGNOSIS — M898X8 Other specified disorders of bone, other site: Secondary | ICD-10-CM | POA: Diagnosis not present

## 2020-08-01 DIAGNOSIS — Z9189 Other specified personal risk factors, not elsewhere classified: Secondary | ICD-10-CM | POA: Diagnosis not present

## 2020-08-01 DIAGNOSIS — C9000 Multiple myeloma not having achieved remission: Secondary | ICD-10-CM | POA: Diagnosis not present

## 2020-08-01 DIAGNOSIS — M899 Disorder of bone, unspecified: Secondary | ICD-10-CM | POA: Diagnosis not present

## 2020-08-01 DIAGNOSIS — Z8579 Personal history of other malignant neoplasms of lymphoid, hematopoietic and related tissues: Secondary | ICD-10-CM | POA: Diagnosis not present

## 2020-08-01 DIAGNOSIS — M79659 Pain in unspecified thigh: Secondary | ICD-10-CM | POA: Diagnosis not present

## 2020-08-01 MED ORDER — DEXAMETHASONE 4 MG PO TABS: 2.0000 mg | ORAL_TABLET | Freq: Every day | ORAL | 0 refills | Status: DC

## 2020-08-01 NOTE — Telephone Encounter (Signed)
I spoke with his dentist The patient has very poor dentition and he recommended complete dental extraction  I recommend we proceed with that before starting chemotherapy In addition, I found out from the patient that he has seen surgical orthopedic oncologist who recommended surgery to prevent risk of pathological fracture sometime next week I recommend him to reduce dexamethasone to 2 mg daily to reduce risk of poor wound healing and hopefully we can stop after his leg surgery I will contact the cancer center about the plan for surgical intervention prior to starting chemotherapy and we will reschedule his appointment with plan to start his chemotherapy 3 to 4 weeks after his leg surgery He expressed verbal understanding

## 2020-08-02 DIAGNOSIS — Z9189 Other specified personal risk factors, not elsewhere classified: Secondary | ICD-10-CM | POA: Diagnosis not present

## 2020-08-02 DIAGNOSIS — C9000 Multiple myeloma not having achieved remission: Secondary | ICD-10-CM | POA: Diagnosis not present

## 2020-08-05 ENCOUNTER — Other Ambulatory Visit (HOSPITAL_COMMUNITY): Payer: 59

## 2020-08-05 ENCOUNTER — Other Ambulatory Visit (HOSPITAL_COMMUNITY): Payer: Self-pay

## 2020-08-07 ENCOUNTER — Ambulatory Visit (HOSPITAL_COMMUNITY): Payer: 59 | Admitting: Hematology and Oncology

## 2020-08-07 ENCOUNTER — Inpatient Hospital Stay (HOSPITAL_COMMUNITY): Payer: 59

## 2020-08-07 ENCOUNTER — Other Ambulatory Visit (HOSPITAL_COMMUNITY): Payer: 59

## 2020-08-07 ENCOUNTER — Ambulatory Visit (HOSPITAL_COMMUNITY): Payer: 59

## 2020-08-08 ENCOUNTER — Telehealth: Payer: Self-pay

## 2020-08-08 NOTE — Telephone Encounter (Signed)
-----   Message from Heath Lark, MD sent at 08/08/2020  8:15 AM EDT ----- Can you call and ask how he is doing? If OK, he can stop dexamethasone

## 2020-08-08 NOTE — Telephone Encounter (Signed)
Called and given below message. He verbalized understanding. He is doing well with no complaints. He will stop dexamethasone today. Surgery is scheduled for tomorrow.

## 2020-08-09 DIAGNOSIS — M84551A Pathological fracture in neoplastic disease, right femur, initial encounter for fracture: Secondary | ICD-10-CM | POA: Diagnosis not present

## 2020-08-09 DIAGNOSIS — Z9189 Other specified personal risk factors, not elsewhere classified: Secondary | ICD-10-CM | POA: Diagnosis not present

## 2020-08-09 DIAGNOSIS — C9000 Multiple myeloma not having achieved remission: Secondary | ICD-10-CM | POA: Diagnosis not present

## 2020-08-09 DIAGNOSIS — Z4789 Encounter for other orthopedic aftercare: Secondary | ICD-10-CM | POA: Diagnosis not present

## 2020-08-12 ENCOUNTER — Encounter: Payer: Self-pay | Admitting: *Deleted

## 2020-08-12 ENCOUNTER — Other Ambulatory Visit: Payer: Self-pay | Admitting: *Deleted

## 2020-08-12 NOTE — Patient Outreach (Signed)
[facility] [facility]) Care Management  08/12/2020  Eugene [name] 1956-01-06 [id number]   Transition of care call  Referral received:08/09/20 Initial outreach:08/12/20 Insurance: [facility]    Subjective: Initial successful telephone call to patient's preferred number in order to complete transition of care assessment; 2 HIPAA identifiers verified. Explained purpose of call and completed transition of care assessment.  Mr.Eugene [name] that he is doing pretty good.He  denies post-operative problems, says surgical incisions are unremarkable, reports patient son changed dressing today. He states surgical pain well managed with prescribed medications, he is using ice pack and continue to elevate . He is tolerating diet, denies bowel or bladder problems, reports not having a bowel movement yet taking colace daily discussed measures to prevent constipation .Patient son/family are assisting with his recovery and able to provide transportation.   Reviewed accessing the following [place] Benefits : He discussed ongoing medical issues, hypertension CAD, he report being active with telephone health coach with Active health management.  He states he does  not have the hospital indemnity, he reports accessing FMLA benefits.  He says he uses a [facility] outpatient pharmacy at  [facility] outpatient pharmacy.     Objective:  [name]  was hospitalized for  impending right pathologic fracture of femur, Comorbidities include: Multiple Myeloma , hypertension , CAD  He was discharged to home on 08/10/20 without the need for home health services or DME.   Assessment:  Patient voices good understanding of all discharge instructions.  See transition of care flowsheet for assessment details.   Plan:  Reviewed hospital discharge diagnosis of ORIF trocanteric femoral IM Nail  and discharge treatment plan using hospital discharge instructions, assessing medication adherence, reviewing problems  requiring provider notification, and discussing the importance of follow up with surgeon, primary care provider and/or specialists as directed. Reinforced adherence to post discharge instructions, and notifying MD of concerns.   Reviewed [place] healthy lifestyle program information to receive discounted premium for  2023   Step 1: Get  your annual physical  Step 2: Complete your health assessment  Step 3:Identify your current health status and complete the corresponding action step between January 13, 2020 and September 12, 2020.    Using [place] website, verified that patient is an active participate in [place]'s [employer] chronic disease management program.    Patient agreeable to call in the next week for follow up on outpatient therapy after orthopedic surgeon appointment. Will route successful outreach letter with [place] Management pamphlet and 24 Hour Nurse Line Magnet to [place] [facility] clinical pool to be mailed to patient's home address.  Thanked patient for their services to [facility].  Joylene Draft, RN, BSN  [place] Management Coordinator  [phone]- Mobile [phone]- Toll Free Main Office

## 2020-08-14 ENCOUNTER — Other Ambulatory Visit (HOSPITAL_COMMUNITY): Payer: 59

## 2020-08-14 ENCOUNTER — Ambulatory Visit (HOSPITAL_COMMUNITY): Payer: 59 | Admitting: Hematology

## 2020-08-14 ENCOUNTER — Ambulatory Visit (HOSPITAL_COMMUNITY): Payer: 59

## 2020-08-15 ENCOUNTER — Other Ambulatory Visit (HOSPITAL_COMMUNITY): Payer: Self-pay

## 2020-08-15 DIAGNOSIS — C9000 Multiple myeloma not having achieved remission: Secondary | ICD-10-CM

## 2020-08-15 MED ORDER — LENALIDOMIDE 10 MG PO CAPS: 10.0000 mg | ORAL_CAPSULE | Freq: Every day | ORAL | 0 refills | Status: DC

## 2020-08-15 NOTE — Telephone Encounter (Signed)
Chart reviewed. Revlimid refilled per last office visit with Dr. Alvy Bimler

## 2020-08-16 ENCOUNTER — Encounter: Payer: 59 | Admitting: Internal Medicine

## 2020-08-21 ENCOUNTER — Ambulatory Visit (HOSPITAL_COMMUNITY): Payer: 59 | Admitting: Hematology

## 2020-08-21 ENCOUNTER — Ambulatory Visit (HOSPITAL_COMMUNITY): Payer: 59

## 2020-08-21 ENCOUNTER — Other Ambulatory Visit (HOSPITAL_COMMUNITY): Payer: 59

## 2020-08-22 ENCOUNTER — Other Ambulatory Visit: Payer: Self-pay | Admitting: *Deleted

## 2020-08-22 NOTE — Patient Outreach (Signed)
[facility] [facility]) Care Management  08/22/2020  Eugene [name] 07/23/1955 [id number]   Transition of care /follow up call    Referral received:08/09/20 Initial outreach:08/12/20 Insurance: [employer]   Subjective: Successful outreach call to patient, reports doing much better. He discussed visit at orthopedic office on yesterday with sutures being removed, incision area looks good and surgeon states okay for patient to begin radiation therapy. He report tolerating mobility with walker at home. He discussed pain controlled with prn medication, discussed measures for management of constipation, drinking adequate fluids, fruits/vegetables, stool softeners.  Patient states that he is awaiting call regarding starting radiation therapy at [facility] system. He discussed that it is his understanding that he will begin chemotherapy after he has dental surgery for tooth extractions. Discussed noting patient has appointment at [facility] for 8/23 infusion. He reports having referral for dental extractions as [facility] but was waiting until after his surgery for follow up, states he plans to follow up regarding next step.    Objective:  [name]  was hospitalized for  impending right pathologic fracture of femur, ORIF trocanteric femoral IM nail . Comorbidities include: Multiple Myeloma , hypertension , CAD  He was discharged to home on 08/10/20 without the need for home health services or DME.  Plan Patient agreeable to plan of call in the next week regarding coordination of upcoming appointments.    Joylene Draft, RN, BSN  [place] Management Coordinator  [phone]- Mobile [phone]- Toll Free Main Office

## 2020-08-22 NOTE — Patient Instructions (Addendum)
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  You will be treated in the clinic weekly in the clinic for 12 weeks with a combination of drugs.  Those drugs are bortezomib (Velcade) and daratumumab (Darzalex FasPro).  Both of these medications are given in the under the skin in your belly.  You will also be taking a chemotherapy pill, Revlimid, at home.  You are to take this pill daily for 14 days, then take 1 week off before resuming taking for 14 days on and 1 week off.  The intent of treatment is to control your disease, prevent it from spreading further, to alleviate any symptoms you may be having related to this disease, and to prepare you for possible bone marrow transplant.  You will see the doctor regularly throughout treatment.  We will obtain blood work from you prior to every treatment and monitor your results to make sure it is safe to give your treatment. The doctor monitors your response to treatment by the way you are feeling, your blood work, and by obtaining scans periodically.  There will be wait times while you are here for treatment.  It will take about 30 minutes to 1 hour for your lab work to result.  Then there will be wait times while pharmacy mixes your medications.   You will receive several pre-medications that your nurse will review with you prior to receiving your injections in the clinic.  These medications help prevent allergic and "infusion" type reactions to the drugs you will be receiving.    Daratumumab (Darzalex Faspro)  About This Drug Daratumumab is used to treat cancer. It is given under the skin in your abdomen (subcutaneously).  Possible Side Effects   You may have a reaction to the drug. Sometimes you may be given medication to stop or lessen these side effects. Your nurse will check you closely for these signs: fever or shaking chills, flushing, facial swelling, feeling dizzy, headache, trouble breathing, rash,  itching, chest tightness, or chest pain. These reactions may happen after your infusion. If this happens, call 911 for emergency care.   Decrease in the number of white blood cells and platelets. This may raise your risk of infection, and raise your risk of bleeding.   Fever and chills   Tiredness   Feeling dizzy   Trouble sleeping   Cough and trouble breathing   Upper respiratory infection   Nausea and throwing up (vomiting)   Loose bowel movements (diarrhea)   Constipation (not able to move bowels)   Muscle spasms   Pain in the joints   Back pain   Swelling of your legs, ankles and/or feet   Effects on the nerves are called peripheral neuropathy. You may feel numbness, tingling, or pain in your hands and feet. It may be hard for you to button your clothes, open jars, or walk as usual. The effect on the nerves may get worse with more doses of the drug. These effects get better in some people after the drug is stopped but it does not get better in all people.  Note: Each of the side effects above was reported in 20% or greater of patients treated with daratumumab. Not all possible side effects are included above.  Warnings and Precautions   Severe decrease in the number of white blood cells and platelets    Severe allergic reaction   This medication can affect the results of blood tests that match your blood  type. Your blood type will be tested before treatment. Be sure to tell all healthcare providers you are taking this medicine before receiving blood transfusions, even for 6 months after your last dose.  Important Information   This drug may be present in the saliva, tears, sweat, urine, stool, vomit, semen, and vaginal secretions. Talk to your doctor and/or your nurse about the necessary precautions to take during this time.  Treating Side Effects   Drink plenty of fluids (a minimum of eight glasses per day is recommended).   If you throw up or have loose bowel  movements, you should drink more fluids so that you do not become dehydrated (lack of water in the body from losing too much fluid).   To help with nausea and vomiting, eat small, frequent meals instead of three large meals a day. Choose foods and drinks that are at room temperature. Ask your nurse or doctor about other helpful tips and medicine that is available to help stop or lessen these symptoms.   If you have diarrhea, eat low-fiber foods that are high in protein and calories and avoid foods that can irritate your digestive tracts or lead to cramping.   Ask your nurse or doctor about medicine that can lessen or stop your diarrhea or constipation.   If you are not able to move your bowels, check with your doctor or nurse before you use enemas, laxatives, or suppositories.   Manage tiredness by pacing your activities for the day.   Be sure to include periods of rest between energy-draining activities.   To decrease the risk of infection, wash your hands regularly.   Avoid close contact with people who have a cold, the flu, or other infections.   Take your temperature as your doctor or nurse tells you, and whenever you feel like you may have a fever.   To help decrease the risk of bleeding, use a soft toothbrush. Check with your nurse before using dental floss.   Be very careful when using knives or tools.   Use an electric shaver instead of a razor.   Keeping your pain under control is important to your well-being. Please tell your doctor or nurse if you are experiencing pain.   If you are dizzy, get up slowly after sitting or lying.   If you are having trouble sleeping, talk to your nurse or doctor on tips to help you sleep better.   If you have numbness and tingling in your hands and feet, be careful when cooking, walking, and handling sharp objects and hot liquids.   Infusion reactions may rarely occur after your infusion. If this happens, call 911 for emergency care.  Food  and Drug Interactions   There are no known interactions of daratumumab with food.   This drug may interact with other medicines. Tell your doctor and pharmacist about all the prescription and over-the-counter medicines and dietary supplements (vitamins, minerals, herbs and others) that you are taking at this time. Also, check with your doctor or pharmacist before starting any new prescription or over-the-counter medicines, or dietary supplements to make sure that there are no interactions.  When to Call the Doctor  Call your doctor or nurse if you have any of these symptoms and/or any new or unusual symptoms:   Fever of 100.4 F (38 C) or higher   Chills   Pain in your chest   Coughing up yellow, green, or bloody mucus.   Wheezing or trouble breathing   Tiredness  that interferes with your daily activities   Trouble falling or staying asleep   Feeling dizzy or lightheaded   Easy bleeding or bruising   Nausea that stops you from eating or drinking and/or is not relieved by prescribed medicines   Vomiting   No bowel movement in 3 days or when you feel uncomfortable.   Loose bowel movements (diarrhea) 4 times a day or loose bowel movements with lack of strength or a feeling of being dizzy   Weight gain of 5 pounds in one week (fluid retention)   Swelling of your legs, ankles and/or feet   Pain that does not go away, or is not relieved by prescribed medicines   Signs of infusion reaction: fever or shaking chills, flushing, facial swelling, feeling dizzy, headache, trouble breathing, rash, itching, chest tightness, or chest pain. If this happens, call 911 for emergency care.   Numbness, tingling, or pain in your hands and feet   If you think you may be pregnant or may have impregnated your partner  Reproduction Warnings   Pregnancy warning: This drug may have harmful effects on the unborn baby. Women of childbearing potential should use effective methods of birth control  during your cancer treatment and for at least 3 months after treatment. Let your doctor know right away if you think you may be pregnant.   Breastfeeding warning: It is not known if this drug passes into breast milk. For this reason, women should talk to their doctor about the risks and benefits of breastfeeding during treatment with this drug because this drug may enter the breast milk and cause harm to a breastfeeding baby.   Fertility warning: Human fertility studies have not been done with this drug. Talk with your doctor or nurse if you plan to have children. Ask for information on sperm or egg banking.   Bortezomib (Velcade)  About This Drug  Bortezomib is used to treat cancer. It is given in the vein (IV) or by a shot under the skin (subcutaneously).  You will receive this injection under your skin.  Possible Side Effects   Bone marrow suppression. Decrease in the number of white blood cells, red blood cells, and platelets. This may raise your risk of infection, make you tired and weak (fatigue), and raise your risk of bleeding.   Nausea and vomiting (throwing up)   Constipation (not able to move bowels)   Diarrhea (loose bowel movements)   Fever   Tiredness   Decreased appetite (decreased hunger)   Effects on the nerves are called peripheral neuropathy. You may feel numbness, tingling, or pain in your hands and feet. It may be hard for you to button your clothes, open jars, or walk as usual. The effect on the nerves may get worse with more doses of the drug. These effects get better in some people after the drug is stopped but it does not get better in all people.   Rash  Note: Each of the side effects above was reported in 20% or greater of patients treated with bortezomib. Not all possible side effects are included above.  Warnings and Precautions   Severe peripheral neuropathy   Low blood pressure   Congestive heart failure - your heart has less ability to pump  blood properly.   Trouble breathing because of fluid build-up and/or inflammation in your lungs   Nausea, vomiting, diarrhea and constipation which sometimes requires treatment to help lessen these side effects. There is also an increased risk of developing  a partial or complete blockage of your small and/or large intestine.   Changes in your central nervous system can happen. The central nervous system is made up of your brain and spinal cord. You could feel extreme tiredness, agitation, confusion, have hallucinations (see or hear things that are not there), trouble understanding or speaking, loss of control of your bowels or bladder, eyesight changes, numbness or lack of strength to your arms, legs, face, or body, seizures or coma. If you start to have any of these symptoms let your doctor know right away.   Tumor lysis syndrome: This drug may act on the cancer cells very quickly. This may affect how your kidneys work.   Changes in your liver function Increased risk of a syndrome that affects your red blood cells, platelets and blood vessels in your kidneys, which can cause kidney failure and be life-threatening.  Important Information   This drug may be present in the saliva, tears, sweat, urine, stool, vomit, semen, and vaginal secretions. Talk to your doctor and/or your nurse about the necessary precautions to take during this time.   This drug may impair your ability to drive or use machinery. Use caution and tell your nurse or doctor if you feel dizzy, very sleepy, and/or experience low blood pressure.  Treating Side Effects   Manage tiredness by pacing your activities for the day.   Be sure to include periods of rest between energy-draining activities.   To decrease the risk of infection, wash your hands regularly.   Avoid close contact with people who have a cold, the flu, or other infections.   Take your temperature as your doctor or nurse tells you, and whenever you feel  like you may have a fever.   To help decrease the risk of bleeding, use a soft toothbrush. Check with your nurse before using dental floss.   Be very careful when using knives or tools.   Use an electric shaver instead of a razor.   Ask your doctor or nurse about medicines that are available to help stop or lessen constipation.   If you are not able to move your bowels, check with your doctor or nurse before you use enemas, laxatives, or suppositories.   Drink plenty of fluids (a minimum of eight glasses per day is recommended).   If you throw up or have loose bowel movements, you should drink more fluids so that you do not become dehydrated (lack of water in the body from losing too much fluid).   If you have diarrhea, eat low-fiber foods that are high in protein and calories and avoid foods that can irritate your digestive tracts or lead to cramping.   Ask your nurse or doctor about medicine that can lessen or stop your diarrhea.   To help with nausea and vomiting, eat small, frequent meals instead of three large meals a day. Choose foods and drinks that are at room temperature. Ask your nurse or doctor about other helpful tips and medicine that is available to help stop or lessen these symptoms.   To help with decreased appetite, eat foods high in calories and protein, such as meat, poultry, fish, dry beans, tofu, eggs, nuts, milk, yogurt, cheese, ice cream, pudding, and nutritional supplements.   Consider using sauces and spices to increase taste. Daily exercise, with your doctor's approval, may increase your appetite.   If you have numbness and tingling in your hands and feet, be careful when cooking, walking, and handling sharp objects and  hot liquids.   If you get a rash do not put anything on it unless your doctor or nurse says you may. Keep the area around the rash clean and dry. Ask your doctor for medicine if your rash bothers you.  Food and Drug Interactions   This drug  may interact with grapefruit and grapefruit juice. Talk to your doctor as this could make side effects worse.   Check with your doctor or pharmacist about all other prescription medicines and over-the-counter medicines and dietary supplements (vitamins, minerals, herbs and others) you are taking before starting this medicine as there are known drug interactions with bortezomib. Also, check with your doctor or pharmacist before starting any new prescription or over-the-counter medicines, or dietary supplements to make sure that there are no interactions.   Avoid the use of St. John's Wort with bortezomib as this may lower the levels of the drug in your body, which can make it less effective.  When to Call the Doctor  Call your doctor or nurse if you have any of these symptoms and/or any new or unusual symptoms:   Fever of 100.4 F (38 C) or higher   Chills   Tiredness that interferes with your daily activities   Feeling dizzy or lightheaded   Feeling that your heart is beating in a fast or not normal way (palpitations)   Cough   Wheezing or trouble breathing   Easy bleeding or bruising   Confusion and/or agitation   Hallucinations   Trouble understanding or speaking   Blurry vision or changes in your eyesight   Numbness or lack of strength to your arms, legs, face, or body   Symptoms of a seizure such as confusion, blacking out, passing out, loss of hearing or vision, blurred vision, unusual smells or tastes (such as burning rubber), trouble talking, tremors or shaking in parts or all of the body, repeated body movements, tense muscles that do not relax, and loss of control of urine and bowels. If you or your family member suspects you are having a seizure, call 911 right away.   Nausea that stops you from eating or drinking and/or is not relieved by prescribed medicines   Throwing up    Lasting loss of appetite or rapid weight loss of five pounds in a week   No bowel  movement in 3 days or when you feel uncomfortable.   Abdominal pain that does not go away   Diarrhea, 4 times in one day or diarrhea with lack of strength or a feeling of being dizzy   Numbness, tingling, or pain your hands and feet   Swelling of legs, ankles, and/or feet   Weight gain of 5 pounds in one week (fluid retention)   Decreased urine, or very dark urine   New rash and/or itching   Rash that is not relieved by prescribed medicines   Signs of tumor lysis: Confusion or agitation, decreased urine, nausea/vomiting, diarrhea, muscle cramping, numbness and/or tingling, seizures.   Signs of possible liver problems: dark urine, pale bowel movements, bad stomach pain, feeling very tired and weak, unusual itching, or yellowing of the eyes or skin   If you think you are pregnant or may have impregnated your partner  Reproduction Warnings   Pregnancy warning: This drug can have harmful effects on the unborn baby. Women of child bearing potential should use effective methods of birth control during your cancer treatment and for at least 7 months after treatment. Men with male partners  of childbearing potential should use effective methods of birth control during your cancer treatment and for at least 4 months after your cancer treatment. Let your doctor know right away if you think you may be pregnant or may have impregnated your partner.   Breastfeeding warning: Women should not breastfeed during treatment and for 2 months month after treatment because this drug could enter the breast milk and cause harm to a breastfeeding baby.   Fertility warning: In men and women both, this drug may affect your ability to have children in the future. Talk with your doctor or nurse if you plan to have children. Ask for information on sperm or egg banking.   Lenalidomide (Revlimid)  About This Drug Lenalidomide is used to treat cancer. It is given orally (by mouth).  Possible Side Effects   Bone marrow suppression. This is a decrease in the number of white blood cells, red blood cells, and platelets. This may raise your risk of infection, make you tired and weak (fatigue), and raise your risk of bleeding.   Nausea   Diarrhea (loose bowel movements)   Constipation (unable to move bowels)   Inflammation of your stomach and/or intestines   Pain in your abdomen or back pain   Fever   Tiredness and weakness   Swelling of your legs, ankles and/or feet   Decreased appetite (decreased hunger)   Muscle cramps/spasms   Pain in your joints   Headache   Feeling dizzy   Tremor   Trouble sleeping   Nosebleed   Upper respiratory infection, bronchitis   Inflammation of the nasal passages and throat   Trouble breathing   Cough   Rash and itching  Note: Each of the side effects above was reported in 15% or greater of patients treated with lenalidomide. Not all possible side effects are included above.  Warnings and Precautions  Blood clots and events such as stroke and heart attack. A blood clot in your leg may cause your leg to swell, appear red and warm, and/or cause pain. A blood clot in your lungs may cause trouble breathing, pain when breathing, and/or chest pain.   Severe bone marrow suppression   Changes in your liver function, which may cause liver failure and be life-threatening.   Tumor lysis syndrome: This drug may act on the cancer cells very quickly. This may affect how your kidneys work and can be life-threatening.   Changes in your thyroid function   Severe allergic skin reaction which may be life-threatening. You may develop blisters on your skin that are filled with fluid or a severe red rash all over your body that may be painful.   This drug may raise your risk of getting a second cancer.   You may develop a syndrome called tumor flare reaction. You may have painful lymph nodes, enlarged spleen, fever, and a rash.   This drug may make it  more difficult to collect your stem cells if a stem cell transplant is part of your treatment plan.   There is a rare increased risk of death in patients with chronic lymphocytic leukemia and a risk of early death (dying sooner) in patient with mantle cell lymphoma.   Allergic reactions, including anaphylaxis are rare but may happen in some patients. Signs of allergic reaction to this drug may be swelling of the face, feeling like your tongue or throat are swelling, trouble breathing, rash, itching, fever, chills, feeling dizzy, and/or feeling that your heart is beating in a  fast or not normal way. If this happens, do not take another dose of this drug. You should get urgent medical treatment.  Note: Some of the side effects above are very rare. If you have concerns and/or questions, please discuss them with your medical team.  Important Information  You will need to sign up for a special program called Revlimid REMS when you start taking this drug. Your nurse will help you get started.   Two negative pregnancy tests are required in women of childbearing potential prior to starting treatment. Routine pregnancy tests are required during treatment.   Do not donate blood during your treatment and for 4 weeks after your treatment.   Men should not donate sperm during your treatment and for 4 weeks after your treatment because this drug is present in semen and may badly harm a baby.   How to Take Your Medication  Swallow the medicine whole with water, with or without food. Do not chew, break, or open it.   Take this medicine at about the same time each day   Missed dose: If you miss a dose, take it as soon as you think about it ONLY if it has been less than 12 hours since you normally take the missed dose. If it has been more than 12 hours, skip the missed dose and contact your physician. Take your next dose at the regular time. Do not take 2 doses at the same time and do not double up on the next  dose.   If you vomit a dose, take your next dose at the regular time.   Handling: Wash your hands after handling your medicine, your caretakers should not handle your medicine with bare hands and should wear latex gloves.   If you get any of the content of a broken capsules on your skin, you should wash the area of the skin well with soap and water right away. Call your doctor if you get a skin reaction.   This drug may be present in the saliva, tears, sweat, urine, stool, vomit, semen, and vaginal secretions. Talk to your doctor and/or your nurse about the necessary precautions to take during this time.   Storage: Store this medicine in the original container at room temperature.   Disposal of unused medicine: Do not flush any expired and/or unused medicine down the toilet or drain unless you are specifically instructed to do so on the medication label. Some facilities have take-back programs and/or other options. If you do not have a take-back program in your area, then please discuss with your nurse or your doctor how to dispose of unused medicine.  Treating Side Effects  Manage tiredness by pacing your activities for the day.   Be sure to include periods of rest between energy-draining activities.   If you are dizzy, get up slowly after sitting or lying.   To decrease the risk of infection, wash your hands regularly.   Avoid close contact with people who have a cold, the flu, or other infections.   Take your temperature as your doctor or nurse tells you, and whenever you feel like you may have a fever.   To help decrease the risk of bleeding, use a soft toothbrush. Check with your nurse before using dental floss.   Be very careful when using knives or tools.   Use an electric shaver instead of a razor.   Ask your doctor or nurse about medicines that are available to help stop or lessen  constipation and/or diarrhea.   If you are not able to move your bowels, check with your  doctor or nurse before you use enemas, laxatives, or suppositories.   Drink plenty of fluids (a minimum of eight glasses per day is recommended).   Drink fluids that contribute calories (whole milk, juice, soft drinks, sweetened beverages, milkshakes, and nutritional supplements) instead of water.   If you throw up or have loose bowel movements, you should drink more fluids so that you do not become dehydrated (lack of water in the body from losing too much fluid).   If you have diarrhea, eat low-fiber foods that are high in protein and calories and avoid foods that can irritate your digestive tracts or lead to cramping.   To help with nausea and vomiting, eat small, frequent meals instead of three large meals a day.  Choose foods and drinks that are at room temperature. Ask your nurse or doctor about other helpful tips and medicine that is available to help stop or lessen these symptoms.   To help with decreased appetite, eat small, frequent meals. Eat foods high in calories and protein, such as meat, poultry, fish, dry beans, tofu, eggs, nuts, milk, yogurt, cheese, ice cream, pudding, and nutritional supplements.   Consider using sauces and spices to increase taste. Daily exercise, with your doctor's approval, may increase your appetite.   If you get a rash, do not put anything on it unless your doctor or nurse says you may. Keep the area around the rash clean and dry. Ask your doctor for medicine if your rash bothers you.   Keeping your pain under control is important to your well-being. Please tell your doctor or nurse if you are experiencing pain.   If you are having trouble sleeping, talk to your nurse or doctor on tips to help you sleep better.   If you have a nosebleed, sit with your head tipped slightly forward. Apply pressure by lightly pinching the bridge of your nose between your thumb and forefinger. Call your doctor if you feel dizzy or faint or if the bleeding does not stop  after 10 to 15 minutes.   Moisturize your skin several times a day.   Avoid sun exposure and apply sunscreen routinely when outdoors.  Food and Drug Interactions   There are no known interactions of lenalidomide with food.   Check with your doctor or pharmacist about all other prescription medicines and over-the-counter medicines and dietary supplements (vitamins, minerals, herbs, and others) you are taking before starting this medicine as there are known drug interactions with lenalidomide. Also, check with your doctor or pharmacist before starting any new prescription or over-the-counter medicines, or dietary supplements to make sure that there are no interactions.   There are known interactions of lenalidomide with blood-thinning medicine such as warfarin. Ask your doctor what precautions you should take.  When to Call the Doctor Call your doctor or nurse if you have any of these symptoms and/or any new or unusual symptoms:  Fever of 100.4 F (38 C) or higher   Chills   Tiredness that interferes with your daily activities   Feeling dizzy or lightheaded   Easy bleeding or bruising   Your leg or arm is swollen, red, warm, and/or painful   Headache that does not go away   Nosebleed that does not stop bleeding after 10-15 minutes   Painful lymph nodes   Wheezing and/or trouble breathing   Chest pain or symptoms of a heart  attack. Most heart attacks involve pain in the center of the chest that lasts more than a few minutes. The pain may go away and come back. It can feel like pressure, squeezing, fullness, or pain. Sometimes pain is felt in one or both arms, the back, neck, jaw, or stomach. If any of these symptoms last 2 minutes, call 911.   Symptoms of a stroke such as sudden numbness or weakness of your face, arm, or leg, mostly on one side of your body; sudden confusion, trouble speaking or understanding; sudden trouble seeing in one or both eyes; sudden trouble walking,  feeling dizzy, loss of balance or coordination; or sudden, bad headache with no known cause. If you have any of these symptoms for 2 minutes, call 911.   Signs of allergic reaction: swelling of the face, feeling like your tongue or throat are swelling, trouble breathing, rash, itching, fever, chills, feeling dizzy, and/or feeling that your heart is beating in a fast or not normal way. If this happens, call 911 for emergency care.   Coughing up yellow, green, or bloody mucus   Feeling that your heart is beating in a fast or not normal way (palpitations)   Nausea that stops you from eating or drinking and/or is not relieved by prescribed medicines   Throwing up more than 3 times a day   Loose bowel movements (diarrhea) 4 times a day or loose bowel movements with lack of strength or a feeling of being dizzy   No bowel movement in 3 days or when you feel uncomfortable   Trouble falling or staying asleep   Pain in your abdomen that does not go away   Weight gain of 5 pounds in one week (fluid retention)   Swelling of your legs, ankles and/or feet   Unexplained weight gain   Lasting loss of appetite or rapid weight loss of five pounds in a week   Pain that does not go away, or is not relieved by prescribed medicines   Flu-like symptoms: fever, headache, muscle and joint aches, and fatigue (low energy, feeling weak)   A new rash or itching that is not relieved by prescribed medicines   Signs of possible liver problems: dark urine, pale bowel movements, bad stomach pain, feeling very tired and weak, unusual itching, or yellowing of the eyes or skin   Signs of tumor lysis: confusion or agitation, decreased urine, nausea/vomiting, diarrhea, muscle cramping, numbness and/or tingling, seizures   If you think you may be pregnant or may have impregnated your partner  Reproduction Warnings   Pregnancy warning: This drug can have harmful effects on the unborn baby. Women of childbearing  potential must commit to abstain from heterosexual intercourse or use 2 effective methods of birth control, one of which, must be a highly effective method of birth control, beginning at least 4 weeks before treatment starts, during your cancer treatment, including dose interruptions, and for at least 4 weeks after treatment. A highly effective method of birth control includes tubal ligation, intrauterine device (IUD), hormonal (birth control pills, injections, patch and/or implants) or a partner's vasectomy. Stop taking lenalidomide immediately and let your doctor know right away if you think you may be pregnant, miss your menstrual period, or experience unusual menstrual bleeding.   Men with male partners of childbearing potential should use effective methods of birth control during your cancer treatment and for at least 4 weeks after your cancer treatment.    Breastfeeding warning: Women should not breastfeed during  treatment because this drug could enter the breast milk and cause harm to a breastfeeding baby.   Fertility warning: Human fertility studies have not been done with this drug. Talk with your doctor or nurse if you plan to have children. Ask for information on sperm or egg banking.   Dexamethasone (Decadron)  About This Drug  Dexamethasone is used to treat cancer, to decrease inflammation and sometimes used before and after chemotherapy to prevent or treat nausea and/or vomiting. It is given in the vein (IV) or orally (by mouth).  Possible Side Effects   Headache   High blood pressure   Abnormal heart beat   Tiredness and weakness   Changes in mood, which may include depression or a feeling of extreme well-being   Trouble sleeping   Increased sweating   Increased appetite (increased hunger)   Weight gain   Increase risk of infections   Pain in your abdomen   Nausea   Skin changes such as rash, dryness, redness   Blood sugar levels may change   Electrolyte  changes   Swelling of your legs, ankles and/or feet   Changes in your liver function   You may be at risk for cataracts, glaucoma or infections of the eye   Muscle loss and / or weakness (lack of muscle strength)   Increased risk of developing osteoporosis- your bones may become weak and brittle  Note: Not all possible side effects are included above.  Warnings and Precautions   This drug may cause you to feel irritable, nervous or restless.   Allergic reactions, including anaphylaxis are rare but may happen in some patients. Signs of allergic reaction to this drug may be swelling of the face, feeling like your tongue or throat are swelling, trouble breathing, rash, itching, fever, chills, feeling dizzy, and/or feeling that your heart is beating in a fast or not normal way. If this happens, do not take another dose of this drug. You should get urgent medical treatment.   High blood pressure and changes in electrolytes, which can cause fluid build-up around your heart, lungs or elsewhere.   Increased risk of developing a hole in your stomach, small, and/or large intestine if you have ulcers in the lining of your stomach and/or intestine, or have diverticulitis, ulcerative colitis and/or other diseases that affect the gastrointestinal tract.   Effects on the endocrine glands including the pituitary, adrenals or thyroid during or after use of this medication.   Changes in the tissue of the heart, that can cause your heart to have less ability to pump blood. You may be short of breath or our arms, hands, legs and feet may swell.   Increased risk of heart attack.   Severe depression and other psychiatric disorders such as mood changes.   Burning, pain and itching around your anus may happen when this drug is given in the vein too rapidly (IV). It usually happens suddenly and resolves in less than 1 minute.  Important Information   Talk to your doctor or your nurse before stopping this  medication, it should be stopped gradually. Depending on the dose and length of treatment, you could experience serious side effects if stopped abruptly (suddenly).   Talk to your doctor before receiving any vaccinations during your treatment. Some vaccinations are not recommended while receiving dexamethasone.  How to Take Your Medication   For Oral (by mouth): You can take the medicine with or without food. If you have nausea or upset stomach, take  it with food.   Missed dose: If you miss a dose, do not take 2 doses at the same time or extra doses.  If you vomit a dose, take your next dose at the regular time. Do not take 2 doses at the same time   Handling: Wash your hands after handling your medicine, your caretakers should not handle your medicine with bare hands and should wear latex gloves.   Storage: Store this medicine in the original container at room temperature. Protect from moisture and light. Discuss with your nurse or your doctor how to dispose of unused medicine.   Treating Side Effects   Drink plenty of fluids (a minimum of eight glasses per day is recommended).   To help with nausea and vomiting, eat small, frequent meals instead of three large meals a day. Choose foods and drinks that are at room temperature. Ask your nurse or doctor about other helpful tips and medicine that is available to help stop or lessen these symptoms.   If you throw up, you should drink more fluids so that you do not become dehydrated (lack of water in the body from losing too much fluid).   Manage tiredness by pacing your activities for the day.   Be sure to include periods of rest between energy-draining activities.   To help with muscle weakness, get regular exercise. If you feel too tired to exercise vigorously, try taking a short walk.   If you are having trouble sleeping, talk to your nurse or doctor on tips to help you sleep better.   If you are feeling depressed, talk to your nurse  or doctor about it.   Keeping your pain under control is important to your well-being. Please tell your doctor or nurse if you are experiencing pain.   If you have diabetes, keep good control of your blood sugar level. Tell your nurse or your doctor if your glucose levels are higher or lower than normal.   To decrease the risk of infection, wash your hands regularly.   Avoid close contact with people who have a cold, the flu, or other infections.   Take your temperature as your doctor or nurse tells you, and whenever you feel like you may have a fever.   If you get a rash do not put anything on it unless your doctor or nurse says you may. Keep the area around the rash clean and dry. Ask your doctor for medicine if your rash bothers you.   Moisturize your skin several times day.   Avoid sun exposure and apply sunscreen routinely when outdoors.  Food and Drug Interactions   There are no known interactions of dexamethasone with food.   Check with your doctor or pharmacist about all other prescription medicines and over-the-counter medicines and dietary supplements (vitamins, minerals, herbs and others) you are taking before starting this medicine as there are known drug interactions with dexamethasone. Also, check with your doctor or pharmacist before starting any new prescription or over-the-counter medicines, or dietary supplement to make sure that there are no interactions.   There are known interactions of dexamethasone with other medicines and products like acetaminophen, aspirin, and ibuprofen. Ask your doctor what over-the-counter (OTC) medicines you can take.  When to Call the Doctor  Call your doctor or nurse if you have any of these symptoms and/or any new or unusual symptoms:   Fever of 100.4 F (38 C) or higher   Chills   A headache that does not  go away   Trouble breathing   Blurry vision or other changes in eyesight   Feel irritable, nervous or restless    Trouble falling or staying asleep   Severe mood changes such as depression or unusual thoughts and/or behaviors   Thoughts of hurting yourself or others, and suicide   Tiredness that interferes with your daily activities   Feeling that your heart is beating in a fast, slow or not normal way   Feeling dizzy or lightheaded   Chest pain or symptoms of a heart attack. Most heart attacks involve pain in the center of the chest that lasts more than a few minutes. The pain may go away and come back, or it can be constant. It can feel like pressure, squeezing, fullness, or pain. Sometimes pain is felt in one or both arms, the back, neck, jaw, or stomach. If any of these symptoms last 2 minutes, call 911.   Heartburn or indigestion   Nausea that stops you from eating or drinking and/or is not relieved by prescribed medicines   Throwing up more than 3 times a day   Pain in your abdomen that does not go away   Abnormal blood sugar   Unusual thirst, passing urine often, headache, sweating, shakiness, irritability   Swelling of legs, ankles, or feet   Weight gain of 5 pounds in one week (fluid retention)   Signs of possible liver problems: dark urine, pale bowel movements, bad stomach pain, feeling very tired and weak, unusual itching, or yellowing of the eyes or skin   Severe muscle weakness   A new rash or a rash that is not relieved by prescribed medicines   Signs of allergic reaction: swelling of the face, feeling like your tongue or throat are swelling, trouble breathing, rash, itching, fever, chills, feeling dizzy, and/or feeling that your heart is beating in a fast or not normal way. If this happens, call 911 for emergency care.   If you think you may be pregnant  Reproduction Warnings   Pregnancy warning: It is not known if this drug may harm an unborn child. For this reason, be sure to talk with your doctor if you are pregnant or planning to become pregnant while receiving this  drug. Let your doctor know right away if you think you may be pregnant or may have impregnated your partner.   Breastfeeding warning: It is not known if this drug passes into breast milk. For this reason, women should talk to their doctor about the risks and benefits of breastfeeding during treatment with this drug because this drug may enter the breast milk and cause harm to a breastfeeding baby.   Fertility warning: Human fertility studies have not been done with this drug. Talk with your doctor or nurse if you plan to have children. Ask for information on sperm banking.    SELF CARE ACTIVITIES WHILE ON CHEMOTHERAPY/IMMUNOTHERAPY:  Hydration Increase your fluid intake 48 hours prior to treatment and drink at least 8 to 12 cups (64 ounces) of water/decaffeinated beverages per day after treatment. You can still have your cup of coffee or soda but these beverages do not count as part of your 8 to 12 cups that you need to drink daily. No alcohol intake.  Medications Continue taking your normal prescription medication as prescribed.  If you start any new herbal or new supplements please let us know first to make sure it is safe.  Mouth Care Have teeth cleaned professionally before starting treatment.  Keep dentures and partial plates clean. Use soft toothbrush and do not use mouthwashes that contain alcohol. Biotene is a good mouthwash that is available at most pharmacies or may be ordered by calling [phone]. Use warm salt water gargles (1 teaspoon salt per 1 quart warm water) before and after meals and at bedtime. Or you may rinse with 2 tablespoons of three-percent hydrogen peroxide mixed in eight ounces of water. If you are still having problems with your mouth or sores in your mouth please call the clinic. If you need dental work, please let the doctor know before you go for your appointment so that we can coordinate the best possible time for you in regards to your chemo regimen. You need  to also let your dentist know that you are actively taking chemo. We may need to do labs prior to your dental appointment.  Skin Care Always use sunscreen that has not expired and with SPF (Sun Protection Factor) of 50 or higher. Wear hats to protect your head from the sun. Remember to use sunscreen on your hands, ears, face, & feet.  Use good moisturizing lotions such as udder cream, eucerin, or even Vaseline. Some chemotherapies can cause dry skin, color changes in your skin and nails.    Avoid long, hot showers or baths. Use gentle, fragrance-free soaps and laundry detergent. Use moisturizers, preferably creams or ointments rather than lotions because the thicker consistency is better at preventing skin dehydration. Apply the cream or ointment within 15 minutes of showering. Reapply moisturizer at night, and moisturize your hands every time after you wash them.   Infection Prevention Please wash your hands for at least 30 seconds using warm soapy water. Handwashing is the #1 way to prevent the spread of germs. Stay away from sick people or people who are getting over a cold. If you develop respiratory systems such as green/yellow mucus production or productive cough or persistent cough let us know and we will see if you need an antibiotic. It is a good idea to keep a pair of gloves on when going into grocery stores/[employer] to decrease your risk of coming into contact with germs on the carts, etc. Carry alcohol hand gel with you at all times and use it frequently if out in public. If your temperature reaches 100.5 or higher please call the clinic and let us know.  If it is after hours or on the weekend please go to the ER if your temperature is over 100.4.  Please have your own personal thermometer at home to use.    Sex and bodily fluids If you are going to have sex, a condom must be used to protect the person that isn't taking immunotherapy. For a few days after treatment, immunotherapy can be  excreted through your bodily fluids.  When using the toilet please close the lid and flush the toilet twice.  Do this for a few day after you have had immunotherapy.   Contraception It is not known for sure whether or not immunotherapy drugs can be passed on through semen or secretions from the vagina. Because of this some doctors advise people to use a barrier method if you have sex during treatment. This applies to vaginal, anal or oral sex.  Generally, doctors advise a barrier method only for the time you are actually having the treatment and for about a week after your treatment.  Advice like this can be worrying, but this does not mean that you have to avoid  being intimate with your partner. You can still have close contact with your partner and continue to enjoy sex.  Animals If you have cats or birds we just ask that you not change the litter or change the cage.  Please have someone else do this for you while you are on immunotherapy.   Food Safety During and After Cancer Treatment Food safety is important for people both during and after cancer treatment. Cancer and cancer treatments, such as chemotherapy, radiation therapy, and stem cell/bone marrow transplantation, often weaken the immune system. This makes it harder for your body to protect itself from foodborne illness, also called food poisoning. Foodborne illness is caused by eating food that contains harmful bacteria, parasites, or viruses.  Foods to avoid Some foods have a higher risk of becoming tainted with bacteria. These include: Unwashed fresh fruit and vegetables, especially leafy vegetables that can hide dirt and other contaminants Raw sprouts, such as alfalfa sprouts Raw or undercooked beef, especially ground beef, or other raw or undercooked meat and poultry Fatty, fried, or spicy foods immediately before or after treatment.  These can sit heavy on your stomach and make you feel nauseous. Raw or undercooked shellfish,  such as oysters. Sushi and sashimi, which often contain raw fish.  Unpasteurized beverages, such as unpasteurized fruit juices, raw milk, raw yogurt, or cider Undercooked eggs, such as soft boiled, over easy, and poached; raw, unpasteurized eggs; or foods made with raw egg, such as homemade raw cookie dough and homemade mayonnaise  Simple steps for food safety  Shop smart. Do not buy food stored or displayed in an unclean area. Do not buy bruised or damaged fruits or vegetables. Do not buy cans that have cracks, dents, or bulges. Pick up foods that can spoil at the end of your shopping trip and store them in a cooler on the way home.  Prepare and clean up foods carefully. Rinse all fresh fruits and vegetables under running water, and dry them with a clean towel or paper towel. Clean the top of cans before opening them. After preparing food, wash your hands for 20 seconds with hot water and soap. Pay special attention to areas between fingers and under nails. Clean your utensils and dishes with hot water and soap. Disinfect your kitchen and cutting boards using 1 teaspoon of liquid, unscented bleach mixed into 1 quart of water.    Dispose of old food. Eat canned and packaged food before its expiration date (the "use by" or "best before" date). Consume refrigerated leftovers within 3 to 4 days. After that time, throw out the food. Even if the food does not smell or look spoiled, it still may be unsafe. Some bacteria, such as Listeria, can grow even on foods stored in the refrigerator if they are kept for too long.  Take precautions when eating out. At restaurants, avoid buffets and salad bars where food sits out for a long time and comes in contact with many people. Food can become contaminated when someone with a virus, often a norovirus, or another "bug" handles it. Put any leftover food in a "to-go" container yourself, rather than having the server do it. And, refrigerate leftovers as soon  as you get home. Choose restaurants that are clean and that are willing to prepare your food as you order it cooked.    SYMPTOMS TO REPORT AS SOON AS POSSIBLE AFTER TREATMENT:  FEVER GREATER THAN 100.4 F CHILLS WITH OR WITHOUT FEVER NAUSEA AND VOMITING THAT IS NOT  CONTROLLED WITH YOUR NAUSEA MEDICATION UNUSUAL SHORTNESS OF BREATH UNUSUAL BRUISING OR BLEEDING TENDERNESS IN MOUTH AND THROAT WITH OR WITHOUT PRESENCE OF ULCERS URINARY PROBLEMS BOWEL PROBLEMS UNUSUAL RASH     Wear comfortable clothing and clothing appropriate for easy access to any Portacath or PICC line. Let us know if there is anything that we can do to make your therapy better!   What to do if you need assistance after hours or on the weekends: CALL [phone].  HOLD on the line, do not hang up.  You will hear multiple messages but at the end you will be connected with a nurse triage line.  They will contact the doctor if necessary.  Most of the time they will be able to assist you.  Do not call the hospital operator.    I have been informed and understand all of the instructions given to me and have received a copy. I have been instructed to call the clinic [phone] or my family physician as soon as possible for continued medical care, if indicated. I do not have any more questions at this time but understand that I may call the [facility] or the Patient Navigator at [phone] during office hours should I have questions or need assistance in obtaining follow-up care.

## 2020-08-23 ENCOUNTER — Other Ambulatory Visit: Payer: Self-pay | Admitting: *Deleted

## 2020-08-23 NOTE — Patient Outreach (Signed)
[facility] [address]) Care Management  08/23/2020  Eugene [name] 03/10/1955 [id number]  Entry of  08/22/20  Monthly telephonic UMR case management review with UMR RNCMs and Dr Alonza Bogus. Update provided to include:  Medical Conditions Update: [name]  was hospitalized for  impending right pathologic fracture of femur, ORIF trocanteric femoral IM nail . Comorbidities include: Multiple Myeloma , hypertension , CAD  He was discharged to home on 08/10/20 without the need for home health services or DME. Discussed patient progress with tolerating mobility in the home with rolling walker, good family support. Discussed patient follow up with orthopedic and per MD okay to proceed with radiation.  Discussed patient is enrolled in active health management for chronic condition management .   Follow up: Will plan follow up call in the next week , to assess for ongoing care coordinaton care needs.  Joylene Draft, RN, BSN  [place] Management Coordinator  [phone]- Mobile [phone]- Toll Free Main Office

## 2020-08-23 NOTE — Progress Notes (Signed)
Pharmacist Chemotherapy Monitoring - Initial Assessment    Anticipated start date: 09/03/20   The following has been reviewed per standard work regarding the patient's treatment regimen: The patient's diagnosis, treatment plan and drug doses, and organ/hematologic function Lab orders and baseline tests specific to treatment regimen  The treatment plan start date, drug sequencing, and pre-medications Prior authorization status  Patient's documented medication list, including drug-drug interaction screen and prescriptions for anti-emetics and supportive care specific to the treatment regimen The drug concentrations, fluid compatibility, administration routes, and timing of the medications to be used The patient's access for treatment and lifetime cumulative dose history, if applicable  The patient's medication allergies and previous infusion related reactions, if applicable   Changes made to treatment plan:  treatment plan date  Follow up needed:  N/A   Wynona Neat, [facility], 08/23/2020  1:56 PM

## 2020-08-28 DIAGNOSIS — C7951 Secondary malignant neoplasm of bone: Secondary | ICD-10-CM | POA: Diagnosis not present

## 2020-08-28 DIAGNOSIS — C9000 Multiple myeloma not having achieved remission: Secondary | ICD-10-CM | POA: Diagnosis not present

## 2020-08-28 DIAGNOSIS — Z510 Encounter for antineoplastic radiation therapy: Secondary | ICD-10-CM | POA: Diagnosis not present

## 2020-08-29 ENCOUNTER — Other Ambulatory Visit: Payer: Self-pay | Admitting: *Deleted

## 2020-08-29 NOTE — Patient Outreach (Signed)
[facility]) Care Management  08/29/2020  [name] [birth year] [id number]  Transition of care follow up call   Subjective: Unsuccessful outreach call to patient , able to leave a HIPAA compliant voicemail message for return call.    Objective:  [name]  was hospitalized for  impending right pathologic fracture of femur, ORIF trocanteric femoral IM nail . Comorbidities include: Multiple Myeloma , hypertension , CAD  He was discharged to home on 08/10/20 without the need for home health services or DME   Plan If no return call with plan follow up in the next week.    Joylene Draft, RN, BSN  [place] Management Coordinator  [phone]- Mobile [phone]- Toll Free Main Office

## 2020-08-30 DIAGNOSIS — Z510 Encounter for antineoplastic radiation therapy: Secondary | ICD-10-CM | POA: Diagnosis not present

## 2020-08-30 DIAGNOSIS — C7951 Secondary malignant neoplasm of bone: Secondary | ICD-10-CM | POA: Diagnosis not present

## 2020-08-30 DIAGNOSIS — C9000 Multiple myeloma not having achieved remission: Secondary | ICD-10-CM | POA: Diagnosis not present

## 2020-09-02 NOTE — Progress Notes (Signed)
Eugene [name], Eugene [name] [id number]   CLINIC:  Medical Oncology/Hematology  PCP:  [name], MD [address] / [place] [address] [phone]   REASON FOR VISIT:  Multiple myeloma  PRIOR THERAPY: none  NGS Results: not done  CURRENT THERAPY: DaraVRd every 3 weeks x 6 cycles  BRIEF ONCOLOGIC HISTORY:  Oncology History  Multiple myeloma without remission ([place])  07/09/2020 Pathology Results   BONE MARROW, ASPIRATE, CLOT, CORE:  -Hypercellular bone marrow with plasma cell neoplasm  -See comment   PERIPHERAL BLOOD:  -Slight macrocytic anemia   COMMENT:   The bone marrow is hypercellular for age with trilineage hematopoiesis and nonspecific myeloid changes.  In this background, the plasma cells are increased in number representing 10% of all cells associated with atypical cytomorphologic features.  The plasma cells display kappa light chain restriction consistent with plasma cell neoplasm.  Correlation with cytogenetic and FISH studies is recommended.    07/24/2020 Initial Diagnosis   Multiple myeloma without remission ([place])   07/24/2020 Cancer Staging   Staging form: Plasma Cell Myeloma and Plasma Cell Disorders, AJCC 8th Edition - Clinical stage from 07/24/2020: RISS Stage I (Beta-2-microglobulin (mg/L): 2.1, Albumin (g/dL): 3.7, ISS: Stage I, High-risk cytogenetics: Absent, LDH: Normal) - Signed by Heath Lark, MD on 07/24/2020 Stage prefix: Initial diagnosis Beta 2 microglobulin range (mg/L): Less than 3.5 Albumin range (g/dL): Greater than or equal to 3.5 Cytogenetics: No abnormalities   09/03/2020 -  Chemotherapy    Patient is on Treatment Plan: MYELOMA NEWLY DIAGNOSED TRANSPLANT CANDIDATE DARAVRD (DARATUMUMAB SQ) Q21D X 6 CYCLES (INDUCTION/CONSOLIDATION)         CANCER STAGING: Cancer Staging Multiple myeloma without remission ([place]) Staging form: Plasma Cell Myeloma and Plasma Cell Disorders, AJCC 8th Edition -  Clinical stage from 07/24/2020: RISS Stage I (Beta-2-microglobulin (mg/L): 2.1, Albumin (g/dL): 3.7, ISS: Stage I, High-risk cytogenetics: Absent, LDH: Normal) - Signed by Heath Lark, MD on 07/24/2020   INTERVAL HISTORY:  Eugene [name], a 65 y.o. male, returns for routine follow-up and consideration for next cycle of chemotherapy. Eugene [name] was last seen on 07/24/20 by Dr. Heath Lark.  Due for cycle #1 of DaraVRd today.   Overall, he tells me he has been feeling pretty well. He reports pain in his right leg and hip for which he has been taking Tylenol. He is not taking acyclovir or decadron.   Overall, he feels ready for next cycle of chemo today.   REVIEW OF SYSTEMS:  Review of Systems  Constitutional:  Negative for appetite change and fatigue (90%).  HENT:   Positive for trouble swallowing (chewing: d/t teeth).   Musculoskeletal:  Positive for myalgias (5/10 R leg).  Neurological:  Positive for numbness (R leg).  Psychiatric/Behavioral:  Positive for sleep disturbance (d/t leg pain).   All other systems reviewed and are negative.  PAST MEDICAL/SURGICAL HISTORY:  Past Medical History:  Diagnosis Date   Back pain    CAD (coronary artery disease)    a. 01/2017 NSTEMI/Cath: LM nl, LAD 25p - ? hypodense but no filling defect, RI nl, LCX nl, RCA nl, EF 55-65%-->Med Rx.   Erectile dysfunction    History of echocardiogram    a. 01/2017 Echo: EF 55-60%, no rwma.   Hypertension    Past Surgical History:  Procedure Laterality Date   LEFT HEART CATH AND CORONARY ANGIOGRAPHY N/A 01/15/2017   Procedure: LEFT HEART CATH AND CORONARY ANGIOGRAPHY;  Surgeon: Martinique, Peter M,  MD;  Location: [facility];  Service: Cardiovascular;  Laterality: N/A;   ORIF trocanteric femoral IM Nail Right     SOCIAL HISTORY:  Social History   Socioeconomic History   Marital status: Divorced    Spouse name: Not on file   Number of children: Not on file   Years of education: Not on file   Highest  education level: Not on file  Occupational History   Not on file  Tobacco Use   Smoking status: Never   Smokeless tobacco: Never  Vaping Use   Vaping Use: Never used  Substance and Sexual Activity   Alcohol use: No   Drug use: No   Sexual activity: Yes  Other Topics Concern   Not on file  Social History Narrative   Not on file   Social Determinants of Health   Financial Resource Strain: Low Risk    Difficulty of Paying Living Expenses: Not hard at all  Food Insecurity: No Food Insecurity   Worried About Charity fundraiser in the Last Year: Never true   [place] in the Last Year: Never true  Transportation Needs: No Transportation Needs   Lack of Transportation (Medical): No   Lack of Transportation (Non-Medical): No  Physical Activity: Insufficiently Active   Days of Exercise per Week: 2 days   Minutes of Exercise per Session: 10 min  Stress: No Stress Concern Present   Feeling of Stress : Not at all  Social Connections: Moderately Integrated   Frequency of Communication with Friends and Family: More than three times a week   Frequency of Social Gatherings with Friends and Family: More than three times a week   Attends Religious Services: 1 to 4 times per year   Active Member of [employer] or Organizations: No   Attends Music therapist: 1 to 4 times per year   Marital Status: Divorced  Human resources officer Violence: Not At Risk   Fear of Current or Ex-Partner: No   Emotionally Abused: No   Physically Abused: No   Sexually Abused: No    FAMILY HISTORY:  Family History  Problem Relation Age of Onset   Cancer Mother        breast   Diabetes Mother    Diabetes Sister    Hypertension Sister    Cancer Brother    Mental illness Sister     CURRENT MEDICATIONS:  Current Outpatient Medications  Medication Sig Dispense Refill   aspirin 81 MG tablet Take 81 mg by mouth daily.       metoprolol succinate (TOPROL-XL) 25 MG 24 hr tablet TAKE 1 TABLET BY  MOUTH DAILY. 90 tablet 3   Multiple Vitamin (MULTIVITAMIN) tablet Take 1 tablet by mouth daily.       nitroGLYCERIN (NITROSTAT) 0.4 MG SL tablet Place 1 tablet (0.4 mg total) under the tongue every 5 (five) minutes for 3 doses as needed for chest pain. 25 tablet 3   rosuvastatin (CRESTOR) 40 MG tablet Take 1 tablet (40 mg total) by mouth daily. 90 tablet 3   acyclovir (ZOVIRAX) 400 MG tablet Take 1 tablet (400 mg total) by mouth 2 (two) times daily. 60 tablet 5   dexamethasone (DECADRON) 4 MG tablet Take 10 tablets (40 mg total) by mouth once a week. 40 tablet 6   traMADol (ULTRAM) 50 MG tablet Take 1 tablet (50 mg total) by mouth every 6 (six) hours as needed. 60 tablet 0   No current facility-administered  medications for this visit.    ALLERGIES:  No Known Allergies  PHYSICAL EXAM:  Performance status (ECOG): 1 - Symptomatic but completely ambulatory  Vitals:   09/03/20 0847  BP: 136/76  Pulse: 66  Resp: 18  Temp: (!) 97 F (36.1 C)  SpO2: 100%   Wt Readings from Last 3 Encounters:  09/03/20 211 lb 3.2 oz (95.8 kg)  07/24/20 217 lb (98.4 kg)  07/10/20 217 lb (98.4 kg)   Physical Exam Vitals reviewed.  Constitutional:      Appearance: Normal appearance.  Cardiovascular:     Rate and Rhythm: Normal rate and regular rhythm.     Pulses: Normal pulses.     Heart sounds: Normal heart sounds.  Pulmonary:     Effort: Pulmonary effort is normal.     Breath sounds: Normal breath sounds.  Neurological:     General: No focal deficit present.     Mental Status: He is alert and oriented to person, place, and time.  Psychiatric:        Mood and Affect: Mood normal.        Behavior: Behavior normal.    LABORATORY DATA:  I have reviewed the labs as listed.  CBC Latest Ref Rng & Units 09/03/2020 07/09/2020 06/26/2020  WBC 4.0 - 10.5 K/uL 6.3 6.1 6.4  Hemoglobin 13.0 - 17.0 g/dL 11.0(L) 12.8(L) 10.7(L)  Hematocrit 39.0 - 52.0 % 34.7(L) 40.1 33.3(L)  Platelets 150 - 400 K/uL 374  266 252   CMP Latest Ref Rng & Units 09/03/2020 06/26/2020 06/02/2019  Glucose 70 - 99 mg/dL 138(H) 96 90  BUN 8 - 23 mg/dL 22 25(H) 19  Creatinine 0.61 - 1.24 mg/dL 1.05 1.10 1.09  Sodium 135 - 145 mmol/L 134(L) 134(L) 134(L)  Potassium 3.5 - 5.1 mmol/L 3.6 3.9 4.3  Chloride 98 - 111 mmol/L 102 101 102  CO2 22 - 32 mmol/L $RemoveB'26 27 25  'NmrDQyFB$ Calcium 8.9 - 10.3 mg/dL 9.1 8.9 9.2  Total Protein 6.5 - 8.1 g/dL 9.7(H) 8.7(H) 9.0(H)  Total Bilirubin 0.3 - 1.2 mg/dL 0.6 0.5 0.6  Alkaline Phos 38 - 126 U/L 83 65 -  AST 15 - 41 U/L $Remo'20 27 20  'mAZAx$ ALT 0 - 44 U/L $Remo'17 22 19    'jUWzg$ DIAGNOSTIC IMAGING:  I have independently reviewed the scans and discussed with the patient. No results found.   ASSESSMENT:  1.  Standard risk plasma cell myeloma: - He reported low back pain radiating to the right thigh since April, pain gets worse when he walks for more than 2 blocks. - He had history of MGUS and was last seen in our clinic in 2019.  He had an abnormal M spike of 3.2 g on 12/06/2018. - Skeletal survey on 10/22/2017 showed no suspicious lytic lesions. - X-ray of the right sided pelvis on 06/11/2020 showed prominent lytic lesions noted in the proximal midportion of the right femoral diaphysis with no evidence of fracture or dislocation. - Bone marrow biopsy on 07/09/2020 with hypercellular marrow with trilineage hematopoiesis.  Plasma cells representing 10% of cells. - Chromosome analysis 46, XY (20).  Multiple myeloma FISH panel negative. - PET scan on 07/04/2020 with multiple hypermetabolic bony soft tissue lesions.  Dominant lesions in the L5 vertebral body and posterior right acetabulum and distal right femur.  Focal increased uptake in the right tonsillar region with associated soft tissue fullness on CT imaging. - 24-hour urine with total protein 134. - Beta-2 microglobulin 2.1 (06/26/2020), LDH 136.  2.  Social/family history: - He works at [employer] in environmental services. - He was never smoker. - 2  brothers died of metastatic lung cancer.  Sister has thyroid cancer.  Mother had breast cancer.   PLAN:  1.  Stage I standard risk IgG kappa multiple myeloma: - We have discussed treatment plan of multiple myeloma with 4 cycles of induction Dara RVD followed by evaluation for transplant. - We discussed the chemotherapy regimen in detail.  We discussed side effects including but not limited to myelosuppression, GI symptoms, peripheral neuropathy, allergic reactions, increased risk for infections among others. - He has received Revlimid 10 mg 14 tablets which she will start today. - Plan to increase Revlimid to 25 mg during cycle 2. - He will also start dexamethasone 40 mg weekly starting next Tuesday.  2.  Right mid thigh pain: - Status post right femur ORIF trochanteric femoral IM nail on 08/09/2020. - Undergoing radiation therapy to the right femur by Dr. Ysidro Evert at [facility].  He will finish the radiation therapy on 09/05/2020. - He complains of pain which is poorly controlled with Tylenol and NSAIDs. - We will give him prescription for tramadol 50 mg to be taken every 6 hours as needed.  3.  Infection prophylaxis: - We will start him on acyclovir 400 mg twice daily. - Continue aspirin 81 mg for thromboprophylaxis.  4.  Myeloma bone disease: - We talked about initiating him on zoledronic acid 4 mg monthly.  We discussed side effects including hypocalcemia and rare chance of ONJ.  He will start calcium supplements.   Orders placed this encounter:  No orders of the defined types were placed in this encounter.    Derek Jack, MD [place] [phone]   I, Thana Ates, am acting as a scribe for Dr. Derek Jack.  I, Derek Jack MD, have reviewed the above documentation for accuracy and completeness, and I agree with the above.

## 2020-09-03 ENCOUNTER — Inpatient Hospital Stay (HOSPITAL_COMMUNITY): Payer: 59

## 2020-09-03 ENCOUNTER — Other Ambulatory Visit: Payer: Self-pay | Admitting: Hematology and Oncology

## 2020-09-03 ENCOUNTER — Other Ambulatory Visit: Payer: Self-pay

## 2020-09-03 ENCOUNTER — Inpatient Hospital Stay (HOSPITAL_COMMUNITY): Payer: 59 | Attending: Hematology | Admitting: Hematology

## 2020-09-03 VITALS — BP 136/76 | HR 66 | Temp 97.0°F | Resp 18 | Wt 211.2 lb

## 2020-09-03 VITALS — BP 140/72 | HR 66 | Temp 97.5°F

## 2020-09-03 DIAGNOSIS — M25551 Pain in right hip: Secondary | ICD-10-CM | POA: Insufficient documentation

## 2020-09-03 DIAGNOSIS — R509 Fever, unspecified: Secondary | ICD-10-CM | POA: Diagnosis not present

## 2020-09-03 DIAGNOSIS — J984 Other disorders of lung: Secondary | ICD-10-CM | POA: Diagnosis not present

## 2020-09-03 DIAGNOSIS — D649 Anemia, unspecified: Secondary | ICD-10-CM | POA: Diagnosis not present

## 2020-09-03 DIAGNOSIS — D630 Anemia in neoplastic disease: Secondary | ICD-10-CM | POA: Diagnosis present

## 2020-09-03 DIAGNOSIS — C9000 Multiple myeloma not having achieved remission: Secondary | ICD-10-CM | POA: Diagnosis present

## 2020-09-03 DIAGNOSIS — D472 Monoclonal gammopathy: Secondary | ICD-10-CM

## 2020-09-03 DIAGNOSIS — I2510 Atherosclerotic heart disease of native coronary artery without angina pectoris: Secondary | ICD-10-CM | POA: Diagnosis present

## 2020-09-03 DIAGNOSIS — Z20822 Contact with and (suspected) exposure to covid-19: Secondary | ICD-10-CM | POA: Diagnosis present

## 2020-09-03 DIAGNOSIS — J189 Pneumonia, unspecified organism: Principal | ICD-10-CM | POA: Diagnosis present

## 2020-09-03 DIAGNOSIS — I10 Essential (primary) hypertension: Secondary | ICD-10-CM | POA: Diagnosis not present

## 2020-09-03 DIAGNOSIS — Z6831 Body mass index (BMI) 31.0-31.9, adult: Secondary | ICD-10-CM

## 2020-09-03 DIAGNOSIS — M899 Disorder of bone, unspecified: Secondary | ICD-10-CM

## 2020-09-03 DIAGNOSIS — R6510 Systemic inflammatory response syndrome (SIRS) of non-infectious origin without acute organ dysfunction: Secondary | ICD-10-CM | POA: Diagnosis present

## 2020-09-03 DIAGNOSIS — C9001 Multiple myeloma in remission: Secondary | ICD-10-CM | POA: Diagnosis not present

## 2020-09-03 DIAGNOSIS — T451X5A Adverse effect of antineoplastic and immunosuppressive drugs, initial encounter: Secondary | ICD-10-CM | POA: Diagnosis present

## 2020-09-03 DIAGNOSIS — E669 Obesity, unspecified: Secondary | ICD-10-CM | POA: Diagnosis not present

## 2020-09-03 DIAGNOSIS — Z8249 Family history of ischemic heart disease and other diseases of the circulatory system: Secondary | ICD-10-CM | POA: Diagnosis not present

## 2020-09-03 DIAGNOSIS — C7951 Secondary malignant neoplasm of bone: Secondary | ICD-10-CM | POA: Diagnosis not present

## 2020-09-03 DIAGNOSIS — Z5112 Encounter for antineoplastic immunotherapy: Secondary | ICD-10-CM | POA: Insufficient documentation

## 2020-09-03 DIAGNOSIS — D849 Immunodeficiency, unspecified: Secondary | ICD-10-CM | POA: Diagnosis not present

## 2020-09-03 DIAGNOSIS — R0600 Dyspnea, unspecified: Secondary | ICD-10-CM | POA: Diagnosis not present

## 2020-09-03 DIAGNOSIS — E876 Hypokalemia: Secondary | ICD-10-CM | POA: Diagnosis present

## 2020-09-03 DIAGNOSIS — E8809 Other disorders of plasma-protein metabolism, not elsewhere classified: Secondary | ICD-10-CM | POA: Diagnosis present

## 2020-09-03 DIAGNOSIS — R502 Drug induced fever: Secondary | ICD-10-CM | POA: Diagnosis present

## 2020-09-03 DIAGNOSIS — M898X5 Other specified disorders of bone, thigh: Secondary | ICD-10-CM

## 2020-09-03 DIAGNOSIS — E872 Acidosis: Secondary | ICD-10-CM | POA: Diagnosis not present

## 2020-09-03 DIAGNOSIS — Z7982 Long term (current) use of aspirin: Secondary | ICD-10-CM | POA: Insufficient documentation

## 2020-09-03 DIAGNOSIS — E782 Mixed hyperlipidemia: Secondary | ICD-10-CM | POA: Diagnosis not present

## 2020-09-03 DIAGNOSIS — Z719 Counseling, unspecified: Secondary | ICD-10-CM

## 2020-09-03 DIAGNOSIS — M79651 Pain in right thigh: Secondary | ICD-10-CM | POA: Insufficient documentation

## 2020-09-03 LAB — CBC WITH DIFFERENTIAL/PLATELET
Abs Immature Granulocytes: 0.02 10*3/uL (ref 0.00–0.07)
Basophils Absolute: 0 10*3/uL (ref 0.0–0.1)
Basophils Relative: 0 %
Eosinophils Absolute: 0 10*3/uL (ref 0.0–0.5)
Eosinophils Relative: 1 %
HCT: 34.7 % — ABNORMAL LOW (ref 39.0–52.0)
Hemoglobin: 11 g/dL — ABNORMAL LOW (ref 13.0–17.0)
Immature Granulocytes: 0 %
Lymphocytes Relative: 50 %
Lymphs Abs: 3.1 10*3/uL (ref 0.7–4.0)
MCH: 32.3 pg (ref 26.0–34.0)
MCHC: 31.7 g/dL (ref 30.0–36.0)
MCV: 101.8 fL — ABNORMAL HIGH (ref 80.0–100.0)
Monocytes Absolute: 0.4 10*3/uL (ref 0.1–1.0)
Monocytes Relative: 6 %
Neutro Abs: 2.7 10*3/uL (ref 1.7–7.7)
Neutrophils Relative %: 43 %
Platelets: 374 10*3/uL (ref 150–400)
RBC: 3.41 MIL/uL — ABNORMAL LOW (ref 4.22–5.81)
RDW: 13.5 % (ref 11.5–15.5)
WBC: 6.3 10*3/uL (ref 4.0–10.5)
nRBC: 0 % (ref 0.0–0.2)

## 2020-09-03 LAB — COMPREHENSIVE METABOLIC PANEL
ALT: 17 U/L (ref 0–44)
AST: 20 U/L (ref 15–41)
Albumin: 3.6 g/dL (ref 3.5–5.0)
Alkaline Phosphatase: 83 U/L (ref 38–126)
Anion gap: 6 (ref 5–15)
BUN: 22 mg/dL (ref 8–23)
CO2: 26 mmol/L (ref 22–32)
Calcium: 9.1 mg/dL (ref 8.9–10.3)
Chloride: 102 mmol/L (ref 98–111)
Creatinine, Ser: 1.05 mg/dL (ref 0.61–1.24)
GFR, Estimated: >60 mL/min (ref >60)
Glucose, Bld: 138 mg/dL — ABNORMAL HIGH (ref 70–99)
Potassium: 3.6 mmol/L (ref 3.5–5.1)
Sodium: 134 mmol/L — ABNORMAL LOW (ref 135–145)
Total Bilirubin: 0.6 mg/dL (ref 0.3–1.2)
Total Protein: 9.7 g/dL — ABNORMAL HIGH (ref 6.5–8.1)

## 2020-09-03 LAB — LACTATE DEHYDROGENASE: LDH: 101 U/L (ref 98–192)

## 2020-09-03 LAB — TYPE AND SCREEN
ABO/RH(D): O POS
Antibody Screen: NEGATIVE

## 2020-09-03 LAB — MAGNESIUM: Magnesium: 1.7 mg/dL (ref 1.7–2.4)

## 2020-09-03 MED ORDER — DIPHENHYDRAMINE HCL 25 MG PO CAPS
25.0000 mg | ORAL_CAPSULE | Freq: Once | ORAL | Status: AC
  Administered 2020-09-03: 25 mg via ORAL
  Filled 2020-09-03: qty 1

## 2020-09-03 MED ORDER — MONTELUKAST SODIUM 10 MG PO TABS
10.0000 mg | ORAL_TABLET | Freq: Once | ORAL | Status: AC
  Administered 2020-09-03: 10 mg via ORAL
  Filled 2020-09-03: qty 1

## 2020-09-03 MED ORDER — TRAMADOL HCL 50 MG PO TABS: 50.0000 mg | ORAL_TABLET | Freq: Four times a day (QID) | ORAL | 0 refills | Status: AC | PRN

## 2020-09-03 MED ORDER — BORTEZOMIB CHEMO SQ INJECTION 3.5 MG (2.5MG/ML)
1.3000 mg/m2 | Freq: Once | INTRAMUSCULAR | Status: AC
  Administered 2020-09-03: 2.75 mg via SUBCUTANEOUS
  Filled 2020-09-03: qty 1.1

## 2020-09-03 MED ORDER — ACETAMINOPHEN 325 MG PO TABS
650.0000 mg | ORAL_TABLET | Freq: Once | ORAL | Status: AC
  Administered 2020-09-03: 650 mg via ORAL
  Filled 2020-09-03: qty 2

## 2020-09-03 MED ORDER — DEXAMETHASONE 4 MG PO TABS
20.0000 mg | ORAL_TABLET | Freq: Once | ORAL | Status: AC
  Administered 2020-09-03: 20 mg via ORAL
  Filled 2020-09-03: qty 5

## 2020-09-03 MED ORDER — ACYCLOVIR 400 MG PO TABS: 400.0000 mg | ORAL_TABLET | Freq: Two times a day (BID) | ORAL | 5 refills | Status: DC

## 2020-09-03 MED ORDER — DARATUMUMAB-HYALURONIDASE-FIHJ 1800-30000 MG-UT/15ML ~~LOC~~ SOLN
1800.0000 mg | Freq: Once | SUBCUTANEOUS | Status: AC
  Administered 2020-09-03: 1800 mg via SUBCUTANEOUS
  Filled 2020-09-03: qty 15

## 2020-09-03 MED ORDER — PROCHLORPERAZINE MALEATE 10 MG PO TABS: 10.0000 mg | ORAL_TABLET | Freq: Four times a day (QID) | ORAL | 3 refills | Status: DC | PRN

## 2020-09-03 MED ORDER — SODIUM CHLORIDE 0.9 % IV SOLN: Freq: Once | INTRAVENOUS | Status: AC

## 2020-09-03 MED ORDER — DEXAMETHASONE 4 MG PO TABS: 40.0000 mg | ORAL_TABLET | ORAL | 6 refills | Status: DC

## 2020-09-03 MED ORDER — ZOLEDRONIC ACID 4 MG/100ML IV SOLN
4.0000 mg | Freq: Once | INTRAVENOUS | Status: AC
  Administered 2020-09-03: 4 mg via INTRAVENOUS
  Filled 2020-09-03: qty 100

## 2020-09-03 NOTE — Patient Instructions (Addendum)
[place] at [facility] Discharge Instructions  You were seen today by Dr. Delton Coombes. He went over your recent results, and your received your treatment. You have been prescribed Decadron: take 5 tablets this week, and then 10 tablets once every following week. You have also been prescribed Acyclovir: take 1 tablet 2 times a day. You have also been prescribed Tramadol: take every 6 hours as needed for pain. Dr. Delton Coombes will see you back in 3 weeks for labs and follow up.   Thank you for choosing [place] at [facility] to provide your oncology and hematology care.  To afford each patient quality time with our provider, please arrive at least 15 minutes before your scheduled appointment time.   If you have a lab appointment with the [facility] please come in thru the Main Entrance and check in at the main information desk  You need to re-schedule your appointment should you arrive 10 or more minutes late.  We strive to give you quality time with our providers, and arriving late affects you and other patients whose appointments are after yours.  Also, if you no show three or more times for appointments you may be dismissed from the clinic at the providers discretion.     Again, thank you for choosing [facility].  Our hope is that these requests will decrease the amount of time that you wait before being seen by our physicians.       _____________________________________________________________  Should you have questions after your visit to [facility], please contact our office at [phone] between the hours of 8:00 a.m. and 4:30 p.m.  Voicemails left after 4:00 p.m. will not be returned until the following business day.  For prescription refill requests, have your pharmacy contact our office and allow 72 hours.    Cancer Center Support Programs:   > Cancer Support Group  2nd Tuesday of the month 1pm-2pm,  Journey Room

## 2020-09-03 NOTE — Progress Notes (Signed)
Patient has been assessed, vital signs and labs have been reviewed by Dr. Katragadda. ANC, Creatinine, LFTs, and Platelets are within treatment parameters per Dr. Katragadda. The patient is good to proceed with treatment at this time. Primary RN and pharmacy aware.  

## 2020-09-03 NOTE — Progress Notes (Signed)
Treatment given per orders. Patient tolerated it well without problems. Vitals stable and discharged home from clinic ambulatory. Follow up as scheduled.  Pt waited his post 2 hour wait time after first darzalex injection without any difficulty.

## 2020-09-03 NOTE — Progress Notes (Signed)
Patient presents today for treatment.  Labs reviewed by Dr. Delton Coombes during his office visit.  Labs are all within parameters.  Patient will proceed with treatment as ordered per Dr. Delton Coombes.

## 2020-09-03 NOTE — Progress Notes (Signed)
Chaplain engaged in an initial visit with Eugene [name].  Angelia talked about his family, healthcare journey, and his life philosophy and theology.  Faron shared that he believes in treating people how he would like to be treated.  He is adamant about being a good father and showing up for his children.  With his diagnosis, he works to also take things one day at a time.  He shared that he knows that there are people worse off than him.  Chaplain offered presence, listening and support as she learned about the dynamics of his upbringing and his faith.   Derrike also shared that he had two brothers that recently passed away from cancer.    Chaplain will continue to follow-up and offer support.      09/03/20 1100  Clinical Encounter Type  Visited With Patient  Visit Type Initial

## 2020-09-03 NOTE — Patient Instructions (Signed)
[facility]  Discharge Instructions: Thank you for choosing [facility] to provide your oncology and hematology care.  If you have a lab appointment with the Cancer Center, please come in thru the Main Entrance and check in at the main information desk.  Wear comfortable clothing and clothing appropriate for easy access to any Portacath or PICC line.   We strive to give you quality time with your provider. You may need to reschedule your appointment if you arrive late (15 or more minutes).  Arriving late affects you and other patients whose appointments are after yours.  Also, if you miss three or more appointments without notifying the office, you may be dismissed from the clinic at the provider's discretion.      For prescription refill requests, have your pharmacy contact our office and allow 72 hours for refills to be completed.        To help prevent nausea and vomiting after your treatment, we encourage you to take your nausea medication as directed.  BELOW ARE SYMPTOMS THAT SHOULD BE REPORTED IMMEDIATELY: *FEVER GREATER THAN 100.4 F (38 C) OR HIGHER *CHILLS OR SWEATING *NAUSEA AND VOMITING THAT IS NOT CONTROLLED WITH YOUR NAUSEA MEDICATION *UNUSUAL SHORTNESS OF BREATH *UNUSUAL BRUISING OR BLEEDING *URINARY PROBLEMS (pain or burning when urinating, or frequent urination) *BOWEL PROBLEMS (unusual diarrhea, constipation, pain near the anus) TENDERNESS IN MOUTH AND THROAT WITH OR WITHOUT PRESENCE OF ULCERS (sore throat, sores in mouth, or a toothache) UNUSUAL RASH, SWELLING OR PAIN  UNUSUAL VAGINAL DISCHARGE OR ITCHING   Items with * indicate a potential emergency and should be followed up as soon as possible or go to the Emergency Department if any problems should occur.  Please show the CHEMOTHERAPY ALERT CARD or IMMUNOTHERAPY ALERT CARD at check-in to the Emergency Department and triage nurse.  Should you have questions after your visit or need to cancel  or reschedule your appointment, please contact [facility] [phone]  and follow the prompts.  Office hours are 8:00 a.m. to 4:30 p.m. Monday - Friday. Please note that voicemails left after 4:00 p.m. may not be returned until the following business day.  We are closed weekends and major holidays. You have access to a nurse at all times for urgent questions. Please call the main number to the clinic [phone] and follow the prompts.  For any non-urgent questions, you may also contact your provider using MyChart. We now offer e-Visits for anyone 18 and older to request care online for non-urgent symptoms. For details visit [email or web address].   Also download the MyChart app! Go to the app store, search "MyChart", open the app, select [place], and log in with your MyChart username and password.  Due to Covid, a mask is required upon entering the hospital/clinic. If you do not have a mask, one will be given to you upon arrival. For doctor visits, patients may have 1 support person aged 18 or older with them. For treatment visits, patients cannot have anyone with them due to current Covid guidelines and our immunocompromised population.  

## 2020-09-04 ENCOUNTER — Other Ambulatory Visit: Payer: Self-pay

## 2020-09-04 ENCOUNTER — Encounter (HOSPITAL_COMMUNITY): Payer: Self-pay | Admitting: *Deleted

## 2020-09-04 ENCOUNTER — Inpatient Hospital Stay (HOSPITAL_COMMUNITY)
Admission: EM | Admit: 2020-09-04 | Discharge: 2020-09-07 | DRG: 194 | Disposition: A | Discharge status: Home / Self Care | Payer: 59 | Attending: Family Medicine | Admitting: Family Medicine

## 2020-09-04 ENCOUNTER — Telehealth (HOSPITAL_COMMUNITY): Payer: Self-pay | Admitting: *Deleted

## 2020-09-04 ENCOUNTER — Emergency Department (HOSPITAL_COMMUNITY): Payer: 59

## 2020-09-04 DIAGNOSIS — I10 Essential (primary) hypertension: Secondary | ICD-10-CM | POA: Diagnosis present

## 2020-09-04 DIAGNOSIS — E872 Acidosis: Secondary | ICD-10-CM

## 2020-09-04 DIAGNOSIS — E669 Obesity, unspecified: Secondary | ICD-10-CM | POA: Diagnosis present

## 2020-09-04 DIAGNOSIS — T451X5A Adverse effect of antineoplastic and immunosuppressive drugs, initial encounter: Secondary | ICD-10-CM | POA: Diagnosis present

## 2020-09-04 DIAGNOSIS — D849 Immunodeficiency, unspecified: Secondary | ICD-10-CM | POA: Diagnosis present

## 2020-09-04 DIAGNOSIS — R0600 Dyspnea, unspecified: Secondary | ICD-10-CM

## 2020-09-04 DIAGNOSIS — R509 Fever, unspecified: Secondary | ICD-10-CM | POA: Diagnosis present

## 2020-09-04 DIAGNOSIS — C7951 Secondary malignant neoplasm of bone: Secondary | ICD-10-CM | POA: Diagnosis not present

## 2020-09-04 DIAGNOSIS — R502 Drug induced fever: Secondary | ICD-10-CM | POA: Diagnosis present

## 2020-09-04 DIAGNOSIS — E8809 Other disorders of plasma-protein metabolism, not elsewhere classified: Secondary | ICD-10-CM | POA: Diagnosis present

## 2020-09-04 DIAGNOSIS — Z8249 Family history of ischemic heart disease and other diseases of the circulatory system: Secondary | ICD-10-CM | POA: Diagnosis not present

## 2020-09-04 DIAGNOSIS — J189 Pneumonia, unspecified organism: Secondary | ICD-10-CM

## 2020-09-04 DIAGNOSIS — R0689 Other abnormalities of breathing: Secondary | ICD-10-CM

## 2020-09-04 DIAGNOSIS — R6510 Systemic inflammatory response syndrome (SIRS) of non-infectious origin without acute organ dysfunction: Secondary | ICD-10-CM | POA: Diagnosis present

## 2020-09-04 DIAGNOSIS — D649 Anemia, unspecified: Secondary | ICD-10-CM | POA: Diagnosis present

## 2020-09-04 DIAGNOSIS — E876 Hypokalemia: Secondary | ICD-10-CM

## 2020-09-04 DIAGNOSIS — E782 Mixed hyperlipidemia: Secondary | ICD-10-CM | POA: Diagnosis present

## 2020-09-04 DIAGNOSIS — C9000 Multiple myeloma not having achieved remission: Secondary | ICD-10-CM | POA: Diagnosis present

## 2020-09-04 DIAGNOSIS — I2510 Atherosclerotic heart disease of native coronary artery without angina pectoris: Secondary | ICD-10-CM | POA: Diagnosis present

## 2020-09-04 DIAGNOSIS — D630 Anemia in neoplastic disease: Secondary | ICD-10-CM | POA: Diagnosis present

## 2020-09-04 DIAGNOSIS — Z6831 Body mass index (BMI) 31.0-31.9, adult: Secondary | ICD-10-CM | POA: Diagnosis not present

## 2020-09-04 DIAGNOSIS — C9001 Multiple myeloma in remission: Secondary | ICD-10-CM | POA: Diagnosis not present

## 2020-09-04 DIAGNOSIS — Z20822 Contact with and (suspected) exposure to covid-19: Secondary | ICD-10-CM | POA: Diagnosis present

## 2020-09-04 LAB — PROTEIN ELECTROPHORESIS, SERUM
A/G Ratio: 0.7 (ref 0.7–1.7)
Albumin ELP: 3.6 g/dL (ref 2.9–4.4)
Alpha-1-Globulin: 0.3 g/dL (ref 0.0–0.4)
Alpha-2-Globulin: 0.9 g/dL (ref 0.4–1.0)
Beta Globulin: 1 g/dL (ref 0.7–1.3)
Gamma Globulin: 3.3 g/dL — ABNORMAL HIGH (ref 0.4–1.8)
Globulin, Total: 5.5 g/dL — ABNORMAL HIGH (ref 2.2–3.9)
M-Spike, %: 2.9 g/dL — ABNORMAL HIGH
Total Protein ELP: 9.1 g/dL — ABNORMAL HIGH (ref 6.0–8.5)

## 2020-09-04 LAB — COMPREHENSIVE METABOLIC PANEL
ALT: 24 U/L (ref 0–44)
AST: 31 U/L (ref 15–41)
Albumin: 3.2 g/dL — ABNORMAL LOW (ref 3.5–5.0)
Alkaline Phosphatase: 67 U/L (ref 38–126)
Anion gap: 8 (ref 5–15)
BUN: 27 mg/dL — ABNORMAL HIGH (ref 8–23)
CO2: 23 mmol/L (ref 22–32)
Calcium: 7.8 mg/dL — ABNORMAL LOW (ref 8.9–10.3)
Chloride: 101 mmol/L (ref 98–111)
Creatinine, Ser: 1.13 mg/dL (ref 0.61–1.24)
GFR, Estimated: >60 mL/min (ref >60)
Glucose, Bld: 124 mg/dL — ABNORMAL HIGH (ref 70–99)
Potassium: 3.3 mmol/L — ABNORMAL LOW (ref 3.5–5.1)
Sodium: 132 mmol/L — ABNORMAL LOW (ref 135–145)
Total Bilirubin: 0.7 mg/dL (ref 0.3–1.2)
Total Protein: 8.8 g/dL — ABNORMAL HIGH (ref 6.5–8.1)

## 2020-09-04 LAB — CBC WITH DIFFERENTIAL/PLATELET
Abs Immature Granulocytes: 0.01 10*3/uL (ref 0.00–0.07)
Basophils Absolute: 0 10*3/uL (ref 0.0–0.1)
Basophils Relative: 0 %
Eosinophils Absolute: 0.1 10*3/uL (ref 0.0–0.5)
Eosinophils Relative: 1 %
HCT: 33.1 % — ABNORMAL LOW (ref 39.0–52.0)
Hemoglobin: 10.7 g/dL — ABNORMAL LOW (ref 13.0–17.0)
Immature Granulocytes: 0 %
Lymphocytes Relative: 14 %
Lymphs Abs: 0.8 10*3/uL (ref 0.7–4.0)
MCH: 32.1 pg (ref 26.0–34.0)
MCHC: 32.3 g/dL (ref 30.0–36.0)
MCV: 99.4 fL (ref 80.0–100.0)
Monocytes Absolute: 0.2 10*3/uL (ref 0.1–1.0)
Monocytes Relative: 3 %
Neutro Abs: 4.6 10*3/uL (ref 1.7–7.7)
Neutrophils Relative %: 82 %
Platelets: 275 10*3/uL (ref 150–400)
RBC: 3.33 MIL/uL — ABNORMAL LOW (ref 4.22–5.81)
RDW: 13.8 % (ref 11.5–15.5)
WBC: 5.6 10*3/uL (ref 4.0–10.5)
nRBC: 0 % (ref 0.0–0.2)

## 2020-09-04 LAB — URINALYSIS, ROUTINE W REFLEX MICROSCOPIC
Bacteria, UA: NONE SEEN
Bilirubin Urine: NEGATIVE
Glucose, UA: NEGATIVE mg/dL
Hgb urine dipstick: NEGATIVE
Ketones, ur: 5 mg/dL — AB
Leukocytes,Ua: NEGATIVE
Nitrite: NEGATIVE
Protein, ur: 30 mg/dL — AB
Specific Gravity, Urine: 1.03 (ref 1.005–1.030)
pH: 5 (ref 5.0–8.0)

## 2020-09-04 LAB — KAPPA/LAMBDA LIGHT CHAINS
Kappa free light chain: 126.6 mg/L — ABNORMAL HIGH (ref 3.3–19.4)
Kappa, lambda light chain ratio: 10.38 — ABNORMAL HIGH (ref 0.26–1.65)
Lambda free light chains: 12.2 mg/L (ref 5.7–26.3)

## 2020-09-04 LAB — LACTIC ACID, PLASMA: Lactic Acid, Venous: 2.5 mmol/L (ref 0.5–1.9)

## 2020-09-04 LAB — PROTIME-INR
INR: 1.1 (ref 0.8–1.2)
Prothrombin Time: 14.6 seconds (ref 11.4–15.2)

## 2020-09-04 LAB — RESP PANEL BY RT-PCR (FLU A&B, COVID) ARPGX2
Influenza A by PCR: NEGATIVE
Influenza B by PCR: NEGATIVE
SARS Coronavirus 2 by RT PCR: NEGATIVE

## 2020-09-04 LAB — PRETREATMENT RBC PHENOTYPE

## 2020-09-04 MED ORDER — ENOXAPARIN SODIUM 40 MG/0.4ML IJ SOSY
40.0000 mg | PREFILLED_SYRINGE | INTRAMUSCULAR | Status: DC
  Administered 2020-09-05 – 2020-09-07 (×3): 40 mg via SUBCUTANEOUS
  Filled 2020-09-04 (×3): qty 0.4

## 2020-09-04 MED ORDER — SODIUM CHLORIDE 0.9 % IV SOLN
2.0000 g | Freq: Once | INTRAVENOUS | Status: AC
  Administered 2020-09-04: 2 g via INTRAVENOUS
  Filled 2020-09-04: qty 2

## 2020-09-04 MED ORDER — IBUPROFEN 400 MG PO TABS
600.0000 mg | ORAL_TABLET | Freq: Once | ORAL | Status: AC
  Administered 2020-09-04: 600 mg via ORAL
  Filled 2020-09-04: qty 2

## 2020-09-04 MED ORDER — SODIUM CHLORIDE 0.9 % IV BOLUS
1000.0000 mL | Freq: Once | INTRAVENOUS | Status: AC
  Administered 2020-09-04: 1000 mL via INTRAVENOUS

## 2020-09-04 MED ORDER — VANCOMYCIN HCL 1500 MG/300ML IV SOLN
1500.0000 mg | Freq: Once | INTRAVENOUS | Status: AC
  Administered 2020-09-05: 1500 mg via INTRAVENOUS
  Filled 2020-09-04: qty 300

## 2020-09-04 NOTE — H&P (Signed)
History and Physical  Azar South CHE:[id number] DOB: [birth year] DOA: 09/04/2020  Referring physician: Luna Fuse, MD PCP: [name], MD  Patient coming from: Home  Chief Complaint: Fever  HPI: Eugene [name] is a 65 y.o. male with medical history significant for multiple myeloma, hypertension, hyperlipidemia, CAD who presents to the emergency department due to fever at home today.  Patient states that he had his first chemotherapy yesterday (8/24) and that today, he spiked fever to 102F, on a second check of temperature, he was still febrile with a temperature of 103F, so he decided to go to the ED for further evaluation and management.  He denies headache, chest pain, shortness of breath, nausea, vomiting, abdominal pain.  ED Course:  In the emergency department, he was febrile with a temperature of 102.41F, other vital signs are within normal range.   Work-up in the ED showed normocytic anemia, hyponatremia, hypokalemia, hypoalbuminemia, lactic acid 2.5 > 1.2, kappa free light chain 126.6, kappa lambda light 10.38.  Urinalysis was unimpressive for UTI.  Influenza A, B, SARS coronavirus 2 was negative. Chest x-ray showed low lung volumes, with patchy bibasilar airspace disease. Findings could reflect pneumonia given clinical presentation. Patient was treated with IV vancomycin and cefepime, IV hydration was provided, Advil was given.  Hospitalist was asked to admit patient for further evaluation and management.   Review of Systems: Constitutional: Positive for fever and chills HENT: Negative for ear pain and sore throat.   Eyes: Negative for pain and visual disturbance.  Respiratory: Negative for cough, chest tightness and shortness of breath.   Cardiovascular: Negative for chest pain and palpitations.  Gastrointestinal: Negative for abdominal pain and vomiting.  Endocrine: Negative for polyphagia and polyuria.  Genitourinary: Negative for decreased urine volume, dysuria,  enuresis Musculoskeletal: Negative for arthralgias and back pain.  Skin: Negative for color change and rash.  Allergic/Immunologic: Negative for immunocompromised state.  Neurological: Negative for tremors, syncope, speech difficulty.  Hematological: Does not bruise/bleed easily.  All other systems reviewed and are negative  Review of systems as noted in the HPI. All other systems reviewed and are negative.   Past Medical History:  Diagnosis Date   Back pain    CAD (coronary artery disease)    a. 01/2017 NSTEMI/Cath: LM nl, LAD 25p - ? hypodense but no filling defect, RI nl, LCX nl, RCA nl, EF 55-65%-->Med Rx.   Erectile dysfunction    History of echocardiogram    a. 01/2017 Echo: EF 55-60%, no rwma.   Hypertension    Past Surgical History:  Procedure Laterality Date   LEFT HEART CATH AND CORONARY ANGIOGRAPHY N/A 01/15/2017   Procedure: LEFT HEART CATH AND CORONARY ANGIOGRAPHY;  Surgeon: Martinique, Peter M, MD;  Location: [facility];  Service: Cardiovascular;  Laterality: N/A;   ORIF trocanteric femoral IM Nail Right     Social History:  reports that he has never smoked. He has never used smokeless tobacco. He reports that he does not drink alcohol and does not use drugs.   No Known Allergies  Family History  Problem Relation Age of Onset   Cancer Mother        breast   Diabetes Mother    Diabetes Sister    Hypertension Sister    Cancer Brother    Mental illness Sister      Prior to Admission medications   Medication Sig Start Date End Date Taking? Authorizing Provider  acyclovir (ZOVIRAX) 400 MG tablet Take 1 tablet (400  mg total) by mouth 2 (two) times daily. 09/03/20  Yes Derek Jack, MD  aspirin 81 MG tablet Take 81 mg by mouth daily.     Yes [provider]  BORTEZOMIB IJ Inject as directed once a week. 09/03/20  Yes [provider]  calcium-vitamin D (OSCAL WITH D) 500-200 MG-UNIT tablet Take 1 tablet by mouth.   Yes [provider]  DARATUMUMAB-HYALURONIDASE-FIHJ [place] Inject into the skin once a week. 09/03/20  Yes [provider]  dexamethasone (DECADRON) 4 MG tablet Take 10 tablets (40 mg total) by mouth once a week. 09/03/20  Yes Derek Jack, MD  lenalidomide (REVLIMID) 10 MG capsule Take 10 mg by mouth daily. Daily, 14 days on 7 days off every 21 days for cycle 1 of tx; after cycle 1 will increase to 25 mg daily 14 days on 1 week off for subsequent cycles 09/03/20  Yes [provider]  metoprolol succinate (TOPROL-XL) 25 MG 24 hr tablet TAKE 1 TABLET BY MOUTH DAILY. Patient taking differently: Take 25 mg by mouth daily. 03/13/20 03/13/21 Yes BranchAlphonse Guild, MD  Multiple Vitamin (MULTIVITAMIN) tablet Take 1 tablet by mouth daily.     Yes [provider]  nitroGLYCERIN (NITROSTAT) 0.4 MG SL tablet Place 1 tablet (0.4 mg total) under the tongue every 5 (five) minutes for 3 doses as needed for chest pain. 07/10/20  Yes Strader, [place] M, PA-C  rosuvastatin (CRESTOR) 40 MG tablet Take 1 tablet (40 mg total) by mouth daily. 12/07/18  Yes [place], Modena Nunnery, MD  traMADol (ULTRAM) 50 MG tablet Take 1 tablet (50 mg total) by mouth every 6 (six) hours as needed. 09/03/20  Yes Derek Jack, MD  prochlorperazine (COMPAZINE) 10 MG tablet Take 1 tablet (10 mg total) by mouth every 6 (six) hours as needed for nausea or vomiting. Patient not taking: Reported on 09/04/2020 09/03/20   Derek Jack, MD    Physical Exam: BP 121/70   Pulse 86   Temp 98.9 F (37.2 C)   Resp 19   Ht _0  (1.753 m)   Wt 95.7 kg   SpO2 98%   BMI 31.16 kg/m   General: 65 y.o. year-old male well developed well nourished in no acute distress.  Alert and oriented x3. HEENT: NCAT, EOMI Neck: Supple, trachea medial Cardiovascular: Regular rate and rhythm with no rubs or gallops.  No thyromegaly or JVD noted.  No lower extremity edema. 2/4 pulses in all 4 extremities. Respiratory: Clear to  auscultation with no wheezes or rales. Good inspiratory effort. Abdomen: Soft, nontender nondistended with normal bowel sounds x4 quadrants. Muskuloskeletal: No cyanosis, clubbing or edema noted bilaterally Neuro: CN II-XII intact, strength 5/5 x 4, sensation, reflexes intact Skin: No ulcerative lesions noted or rashes Psychiatry: Judgement and insight appear normal. Mood is appropriate for condition and setting          Labs on Admission:  Basic Metabolic Panel: Recent Labs  Lab 09/03/20 0811 09/04/20 2011  NA 134* 132*  K 3.6 3.3*  CL 102 101  CO2 26 23  GLUCOSE 138* 124*  BUN 22 27*  CREATININE 1.05 1.13  CALCIUM 9.1 7.8*  MG 1.7  --    Liver Function Tests: Recent Labs  Lab 09/03/20 0811 09/04/20 2011  AST 20 31  ALT 17 24  ALKPHOS 83 67  BILITOT 0.6 0.7  PROT 9.7* 8.8*  ALBUMIN 3.6 3.2*   No results for input(s): LIPASE, AMYLASE in the last 168 hours. No  results for input(s): AMMONIA in the last 168 hours. CBC: Recent Labs  Lab 09/03/20 0811 09/04/20 2011  WBC 6.3 5.6  NEUTROABS 2.7 4.6  HGB 11.0* 10.7*  HCT 34.7* 33.1*  MCV 101.8* 99.4  PLT 374 275   Cardiac Enzymes: No results for input(s): CKTOTAL, CKMB, CKMBINDEX, TROPONINI in the last 168 hours.  BNP (last 3 results) No results for input(s): BNP in the last 8760 hours.  ProBNP (last 3 results) No results for input(s): PROBNP in the last 8760 hours.  CBG: No results for input(s): GLUCAP in the last 168 hours.  Radiological Exams on Admission: DG Chest 2 View  Result Date: 09/04/2020 CLINICAL DATA:  Fever, suspected sepsis EXAM: CHEST - 2 VIEW COMPARISON:  06/24/2018 FINDINGS: Frontal and lateral views of the chest demonstrate unremarkable cardiac silhouette. Lung volumes are diminished, patchy bibasilar consolidation greatest in the right infrahilar region. Findings could reflect hypoventilatory change or developing airspace disease. No effusion or pneumothorax. No acute bony abnormalities.  IMPRESSION: 1. Low lung volumes, with patchy bibasilar airspace disease. Findings could reflect pneumonia given clinical presentation. Electronically Signed   By: Randa Ngo M.D.   On: 09/04/2020 21:09    EKG: I independently viewed the EKG done and my findings are as followed: EKG was not done in the ED  Assessment/Plan Present on Admission:  Acute febrile illness  Obesity  CAD (coronary artery disease)  Multiple myeloma without remission ([place])  Essential hypertension  Principal Problem:   Acute febrile illness Active Problems:   Obesity   CAD (coronary artery disease)   Essential hypertension   Multiple myeloma without remission ([facility])   Mixed hyperlipidemia   Hypokalemia   Hypoalbuminemia   Normocytic anemia   Lactic acidosis   Acute febrile illness possibly secondary to pneumonia Chest x-ray was suggestive of possible pneumonia Patient was empirically started on IV vancomycin and cefepime, we shall continue with same at this time with plan to de-escalate/discontinue based on blood culture, urine Legionella, strep pneumo and procalcitonin Continue Mucinex, incentive spirometry, flutter valve  Multiple myeloma without remission Patient started his chemotherapy yesterday Patient is being treated with daratumumab, bortezomib and zoledronic acid Patient follows with Dr. Alvy Bimler, [facility] was consulted in the ED  Hypokalemia K+ is 3.3 K+ will be replenished Please monitor for AM K+ for further replenishmemnt  Hypoalbuminemia possibly secondary to mild protein calorie malnutrition Protein supplement to be provided  Normocytic anemia-stable  Lactic acidosis-resolved Lactic acid  2.5 > 1.2  CAD Continue aspirin, Crestor Nitroglycerin held at this time due to soft BP  Essential hypertension Metoprolol will be held at this time due to soft BP  Mixed hyperlipidemia Continue Crestor  Obesity (BMI 31.16) Patient will be counseled on diet and lifestyle  medication when more stable  Other home medications: Acyclovir, Os-Cal with D, multivitamin  DVT prophylaxis: Lovenox  Code Status: Full code  Family Communication: Daughter at bedside (all questions answered to satisfaction)  Disposition Plan:  Patient is from:                        home Anticipated DC to:                   SNF or family members home Anticipated DC date:               2-3 days Anticipated DC barriers:          Patient requires inpatient management due to acute  febrile illness with suspicion for possible pneumonia  Consults called: Oncology  Admission status: Inpatient    Bernadette Hoit MD [facility]  09/05/2020, 4:40 AM

## 2020-09-04 NOTE — Telephone Encounter (Signed)
Spoke with pt for 24 hour f/u - denies any adverse effects from tx yesterday.  Instructed to call the clinic with any questions or concerns, or should he develop any new s/s as discussed.  Verbalizes understanding.

## 2020-09-04 NOTE — ED Provider Notes (Signed)
[facility] EMERGENCY DEPARTMENT Provider Note   CSN: [id number] Arrival date & time: 09/04/20  1924     History Chief Complaint  Patient presents with   Fever    Eugene [name] is a 65 y.o. male.  Patient with history of multiple myeloma last received chemotherapy injection yesterday.  Presents with fever today.  Otherwise denies any other symptoms.  Denies sore throat or cough or runny nose.  Denies headache or chest pain or abdominal pain.  Denies vomiting or diarrhea.      Past Medical History:  Diagnosis Date   Back pain    CAD (coronary artery disease)    a. 01/2017 NSTEMI/Cath: LM nl, LAD 25p - ? hypodense but no filling defect, RI nl, LCX nl, RCA nl, EF 55-65%-->Med Rx.   Erectile dysfunction    History of echocardiogram    a. 01/2017 Echo: EF 55-60%, no rwma.   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   CAD (coronary artery disease) 01/17/2017   NSTEMI (non-ST elevated myocardial infarction) ([place]) 01/15/2017   Obesity 09/14/2014   Routine general medical examination at a health care facility 08/24/2013   ED (erectile dysfunction) 01/20/2011    Past Surgical History:  Procedure Laterality Date   LEFT HEART CATH AND CORONARY ANGIOGRAPHY N/A 01/15/2017   Procedure: LEFT HEART CATH AND CORONARY ANGIOGRAPHY;  Surgeon: Martinique, Peter M, MD;  Location: [facility];  Service: Cardiovascular;  Laterality: N/A;   ORIF trocanteric femoral IM Nail Right        Family History  Problem Relation Age of Onset   Cancer Mother        breast   Diabetes Mother    Diabetes Sister    Hypertension Sister    Cancer Brother    Mental illness Sister     Social History   Tobacco Use   Smoking status: Never   Smokeless tobacco: Never  Vaping  Use   Vaping Use: Never used  Substance Use Topics   Alcohol use: No   Drug use: No    Home Medications Prior to Admission medications   Medication Sig Start Date End Date Taking? Authorizing Provider  acyclovir (ZOVIRAX) 400 MG tablet Take 1 tablet (400 mg total) by mouth 2 (two) times daily. 09/03/20  Yes Derek Jack, MD  aspirin 81 MG tablet Take 81 mg by mouth daily.     Yes [provider]  BORTEZOMIB IJ Inject as directed once a week. 09/03/20  Yes [provider]  calcium-vitamin D (OSCAL WITH D) 500-200 MG-UNIT tablet Take 1 tablet by mouth.   Yes [provider]  DARATUMUMAB-HYALURONIDASE-FIHJ [place] Inject into the skin once a week. 09/03/20  Yes [provider]  dexamethasone (DECADRON) 4 MG tablet Take 10 tablets (40 mg total) by mouth once a week. 09/03/20  Yes Derek Jack, MD  lenalidomide (REVLIMID) 10 MG capsule Take 10 mg by mouth daily. Daily, 14 days on 7 days off every 21 days for cycle 1 of tx; after cycle 1 will increase to 25 mg daily 14 days on 1 week off for subsequent cycles 09/03/20  Yes [provider]  metoprolol succinate (TOPROL-XL) 25 MG 24 hr tablet TAKE 1 TABLET BY MOUTH DAILY. Patient taking differently: Take 25 mg by  mouth daily. 03/13/20 03/13/21 Yes BranchAlphonse Guild, MD  Multiple Vitamin (MULTIVITAMIN) tablet Take 1 tablet by mouth daily.     Yes [provider]  nitroGLYCERIN (NITROSTAT) 0.4 MG SL tablet Place 1 tablet (0.4 mg total) under the tongue every 5 (five) minutes for 3 doses as needed for chest pain. 07/10/20  Yes Strader, [place] M, PA-C  rosuvastatin (CRESTOR) 40 MG tablet Take 1 tablet (40 mg total) by mouth daily. 12/07/18  Yes [place], Modena Nunnery, MD  traMADol (ULTRAM) 50 MG tablet Take 1 tablet (50 mg total) by mouth every 6 (six) hours as needed. 09/03/20  Yes Derek Jack, MD  prochlorperazine (COMPAZINE) 10 MG tablet Take 1 tablet (10 mg total) by mouth every 6 (six)  hours as needed for nausea or vomiting. Patient not taking: Reported on 09/04/2020 09/03/20   Derek Jack, MD    Allergies    Patient has no known allergies.  Review of Systems   Review of Systems  Constitutional:  Positive for chills and fever.  HENT:  Negative for ear pain and sore throat.   Eyes:  Negative for pain.  Respiratory:  Negative for cough.   Cardiovascular:  Negative for chest pain.  Gastrointestinal:  Negative for abdominal pain.  Genitourinary:  Negative for flank pain.  Musculoskeletal:  Negative for back pain.  Skin:  Negative for color change and rash.  Neurological:  Negative for syncope.  All other systems reviewed and are negative.  Physical Exam Updated Vital Signs BP 97/62   Pulse 84   Temp 98.9 F (37.2 C)   Resp (!) 21   Ht $R'5\' 9"'Er$  (1.753 m)   Wt 95.7 kg   SpO2 97%   BMI 31.16 kg/m   Physical Exam Constitutional:      Appearance: He is well-developed.  HENT:     Head: Normocephalic.     Nose: Nose normal.  Eyes:     Extraocular Movements: Extraocular movements intact.  Cardiovascular:     Rate and Rhythm: Tachycardia present.  Pulmonary:     Comments: Moderately tachypneic Skin:    Coloration: Skin is not jaundiced.  Neurological:     Mental Status: He is alert. Mental status is at baseline.    ED Results / Procedures / Treatments   Labs (all labs ordered are listed, but only abnormal results are displayed) Labs Reviewed  COMPREHENSIVE METABOLIC PANEL - Abnormal; Notable for the following components:      Result Value   Sodium 132 (*)    Potassium 3.3 (*)    Glucose, Bld 124 (*)    BUN 27 (*)    Calcium 7.8 (*)    Total Protein 8.8 (*)    Albumin 3.2 (*)    All other components within normal limits  LACTIC ACID, PLASMA - Abnormal; Notable for the following components:   Lactic Acid, Venous 2.5 (*)    All other components within normal limits  CBC WITH DIFFERENTIAL/PLATELET - Abnormal; Notable for the following  components:   RBC 3.33 (*)    Hemoglobin 10.7 (*)    HCT 33.1 (*)    All other components within normal limits  CULTURE, BLOOD (ROUTINE X 2)  CULTURE, BLOOD (ROUTINE X 2)  RESP PANEL BY RT-PCR (FLU A&B, COVID) ARPGX2  PROTIME-INR  LACTIC ACID, PLASMA  URINALYSIS, ROUTINE W REFLEX MICROSCOPIC    EKG None  Radiology DG Chest 2 View  Result Date: 09/04/2020 CLINICAL DATA:  Fever, suspected sepsis EXAM: CHEST - 2  VIEW COMPARISON:  06/24/2018 FINDINGS: Frontal and lateral views of the chest demonstrate unremarkable cardiac silhouette. Lung volumes are diminished, patchy bibasilar consolidation greatest in the right infrahilar region. Findings could reflect hypoventilatory change or developing airspace disease. No effusion or pneumothorax. No acute bony abnormalities. IMPRESSION: 1. Low lung volumes, with patchy bibasilar airspace disease. Findings could reflect pneumonia given clinical presentation. Electronically Signed   By: Randa Ngo M.D.   On: 09/04/2020 21:09    Procedures Procedures   Medications Ordered in ED Medications  sodium chloride 0.9 % bolus 1,000 mL (1,000 mLs Intravenous Incomplete 09/04/20 2149)  sodium chloride 0.9 % bolus 1,000 mL (0 mLs Intravenous Stopped 09/04/20 2139)  ibuprofen (ADVIL) tablet 600 mg (600 mg Oral Given 09/04/20 1953)  ceFEPIme (MAXIPIME) 2 g in sodium chloride 0.9 % 100 mL IVPB (0 g Intravenous Stopped 09/04/20 2215)    ED Course  I have reviewed the triage vital signs and the nursing notes.  Pertinent labs & imaging results that were available during my care of the patient were reviewed by me and considered in my medical decision making (see chart for details).    MDM Rules/Calculators/A&P                           Patient is immunocompromise, presenting with fever and tachycardia.  Sepsis protocol initiated.  Lactic acid mildly elevated 2.5 white count is otherwise normal.  Patient given 2 L bolus of IV fluids.  Blood pressure remains  slightly low but within normal range.  Patient started on broad-spectrum antibiotics cefepime and vancomycin.  Case discussed with oncology.  Case discussed with hospitalist for admission.  Final Clinical Impression(s) / ED Diagnoses Final diagnoses:  Community acquired pneumonia, unspecified laterality    Rx / DC Orders ED Discharge Orders     None        Luna Fuse, MD 09/04/20 2309

## 2020-09-04 NOTE — ED Triage Notes (Signed)
Pt received chemo yesterday.  Fever today and took tylenol about 20 min. Ago.  102.9 fever in triage. Denies any pain at present.

## 2020-09-05 ENCOUNTER — Ambulatory Visit: Payer: Self-pay | Admitting: *Deleted

## 2020-09-05 DIAGNOSIS — E8809 Other disorders of plasma-protein metabolism, not elsewhere classified: Secondary | ICD-10-CM | POA: Diagnosis present

## 2020-09-05 DIAGNOSIS — D649 Anemia, unspecified: Secondary | ICD-10-CM | POA: Diagnosis present

## 2020-09-05 DIAGNOSIS — E876 Hypokalemia: Secondary | ICD-10-CM | POA: Diagnosis present

## 2020-09-05 DIAGNOSIS — E782 Mixed hyperlipidemia: Secondary | ICD-10-CM | POA: Diagnosis present

## 2020-09-05 DIAGNOSIS — R509 Fever, unspecified: Secondary | ICD-10-CM | POA: Diagnosis not present

## 2020-09-05 DIAGNOSIS — E872 Acidosis: Secondary | ICD-10-CM | POA: Diagnosis present

## 2020-09-05 LAB — PROCALCITONIN: Procalcitonin: 8.66 ng/mL

## 2020-09-05 LAB — CBC
HCT: 30.5 % — ABNORMAL LOW (ref 39.0–52.0)
Hemoglobin: 9.7 g/dL — ABNORMAL LOW (ref 13.0–17.0)
MCH: 31.9 pg (ref 26.0–34.0)
MCHC: 31.8 g/dL (ref 30.0–36.0)
MCV: 100.3 fL — ABNORMAL HIGH (ref 80.0–100.0)
Platelets: 243 10*3/uL (ref 150–400)
RBC: 3.04 MIL/uL — ABNORMAL LOW (ref 4.22–5.81)
RDW: 13.9 % (ref 11.5–15.5)
WBC: 4.3 10*3/uL (ref 4.0–10.5)
nRBC: 0 % (ref 0.0–0.2)

## 2020-09-05 LAB — COMPREHENSIVE METABOLIC PANEL
ALT: 22 U/L (ref 0–44)
AST: 25 U/L (ref 15–41)
Albumin: 2.8 g/dL — ABNORMAL LOW (ref 3.5–5.0)
Alkaline Phosphatase: 58 U/L (ref 38–126)
Anion gap: 7 (ref 5–15)
BUN: 26 mg/dL — ABNORMAL HIGH (ref 8–23)
CO2: 21 mmol/L — ABNORMAL LOW (ref 22–32)
Calcium: 7.5 mg/dL — ABNORMAL LOW (ref 8.9–10.3)
Chloride: 107 mmol/L (ref 98–111)
Creatinine, Ser: 1.11 mg/dL (ref 0.61–1.24)
GFR, Estimated: >60 mL/min (ref >60)
Glucose, Bld: 132 mg/dL — ABNORMAL HIGH (ref 70–99)
Potassium: 3.4 mmol/L — ABNORMAL LOW (ref 3.5–5.1)
Sodium: 135 mmol/L (ref 135–145)
Total Bilirubin: 0.6 mg/dL (ref 0.3–1.2)
Total Protein: 7.4 g/dL (ref 6.5–8.1)

## 2020-09-05 LAB — PROTIME-INR
INR: 1.3 — ABNORMAL HIGH (ref 0.8–1.2)
Prothrombin Time: 16 seconds — ABNORMAL HIGH (ref 11.4–15.2)

## 2020-09-05 LAB — HIV ANTIBODY (ROUTINE TESTING W REFLEX): HIV Screen 4th Generation wRfx: NONREACTIVE

## 2020-09-05 LAB — MAGNESIUM: Magnesium: 1.5 mg/dL — ABNORMAL LOW (ref 1.7–2.4)

## 2020-09-05 LAB — PHOSPHORUS: Phosphorus: 2.2 mg/dL — ABNORMAL LOW (ref 2.5–4.6)

## 2020-09-05 LAB — LACTIC ACID, PLASMA: Lactic Acid, Venous: 1.2 mmol/L (ref 0.5–1.9)

## 2020-09-05 LAB — APTT: aPTT: 28 seconds (ref 24–36)

## 2020-09-05 MED ORDER — ENSURE ENLIVE PO LIQD
237.0000 mL | Freq: Two times a day (BID) | ORAL | Status: DC
  Administered 2020-09-05 – 2020-09-07 (×4): 237 mL via ORAL
  Filled 2020-09-05 (×4): qty 237

## 2020-09-05 MED ORDER — DM-GUAIFENESIN ER 30-600 MG PO TB12
1.0000 | ORAL_TABLET | Freq: Two times a day (BID) | ORAL | Status: DC
  Administered 2020-09-06 – 2020-09-07 (×3): 1 via ORAL
  Filled 2020-09-05 (×5): qty 1

## 2020-09-05 MED ORDER — VANCOMYCIN HCL 1500 MG/300ML IV SOLN
1500.0000 mg | INTRAVENOUS | Status: DC
  Administered 2020-09-05 – 2020-09-06 (×2): 1500 mg via INTRAVENOUS
  Filled 2020-09-05 (×2): qty 300

## 2020-09-05 MED ORDER — SODIUM CHLORIDE 0.9 % IV SOLN
2.0000 g | Freq: Three times a day (TID) | INTRAVENOUS | Status: DC
  Administered 2020-09-05 – 2020-09-07 (×7): 2 g via INTRAVENOUS
  Filled 2020-09-05 (×6): qty 2

## 2020-09-05 MED ORDER — ROSUVASTATIN CALCIUM 20 MG PO TABS
40.0000 mg | ORAL_TABLET | Freq: Every day | ORAL | Status: DC
  Filled 2020-09-05 (×3): qty 2

## 2020-09-05 MED ORDER — ASPIRIN 81 MG PO CHEW
81.0000 mg | CHEWABLE_TABLET | Freq: Every day | ORAL | Status: DC
  Administered 2020-09-05 – 2020-09-07 (×3): 81 mg via ORAL
  Filled 2020-09-05 (×3): qty 1

## 2020-09-05 MED ORDER — ACYCLOVIR 800 MG PO TABS
400.0000 mg | ORAL_TABLET | Freq: Two times a day (BID) | ORAL | Status: DC
  Administered 2020-09-05 – 2020-09-07 (×4): 400 mg via ORAL
  Filled 2020-09-05 (×5): qty 1

## 2020-09-05 MED ORDER — POTASSIUM CHLORIDE 20 MEQ PO PACK
40.0000 meq | PACK | Freq: Once | ORAL | Status: AC
  Administered 2020-09-05: 40 meq via ORAL
  Filled 2020-09-05: qty 2

## 2020-09-05 MED ORDER — ADULT MULTIVITAMIN W/MINERALS CH
1.0000 | ORAL_TABLET | Freq: Every day | ORAL | Status: DC
  Administered 2020-09-05 – 2020-09-07 (×3): 1 via ORAL
  Filled 2020-09-05 (×3): qty 1

## 2020-09-05 MED ORDER — CALCIUM CARBONATE-VITAMIN D 500-200 MG-UNIT PO TABS
1.0000 | ORAL_TABLET | Freq: Every day | ORAL | Status: DC
  Administered 2020-09-05 – 2020-09-07 (×3): 1 via ORAL
  Filled 2020-09-05 (×3): qty 1

## 2020-09-05 NOTE — ED Notes (Signed)
Patient resting at this time.

## 2020-09-05 NOTE — Progress Notes (Signed)
Patient Demographics:    Eugene [name], is a 65 y.o. male, DOB - 03/11/1955, PNT:[id number]  Admit date - 09/04/2020   Admitting Physician Bernadette Hoit, DO  Outpatient Primary MD for the patient is [name], MD  LOS - 1   Chief Complaint  Patient presents with   Fever        Subjective:    Eugene [name] today has   no emesis,  No chest pain,  -No further fevers at this time  Assessment  & Plan :    Principal Problem:   Acute febrile illness Active Problems:   Obesity   CAD (coronary artery disease)   Essential hypertension   Multiple myeloma without remission (HCC)   Mixed hyperlipidemia   Hypokalemia   Hypoalbuminemia   Normocytic anemia   Lactic acidosis  Brief Summary:- 65 y.o. male with medical history significant for multiple myeloma, hypertension, hyperlipidemia, CAD admitted on 09/04/2020 with febrile illness in the setting of ongoing chemotherapy  A/p 1) acute febrile illness/SIRS-patient with T-max of 102.9, tachycardia and tachypnea PCT is 8.66 -Repeat chest x-ray in a.m. -Patient is immunocompromise due to ongoing chemotherapy -Continue Vanco and cefepime pending further culture data Lactic acid 2.5 >>1.2  2)Multiple myeloma without remission -Took chemotherapy on 09/03/2020  -patient is being treated with daratumumab, bortezomib and zoledronic acid Patient follows with Dr. Alvy Bimler, [place] and Dr. Delton Coombes  3) normocytic chronic anemia of malignancy and chemoRx--patient with neoplastic stable anemia -Hemoglobin greater than 9 which is close to patient's baseline  4)H/o CAD-no ACS type symptoms, continue Crestor and aspirin -Hold metoprolol until BP improves  5) hypokalemia--- replace and recheck  Disposition/Need for in-Hospital Stay- patient unable to be discharged at this time due to --febrile illness in an Immunocompromised patient continue IV Vanco and  cefepime pending culture data*  Status is: Inpatient  Remains inpatient appropriate because: Please see disposition above  Disposition: The patient is from: Home              Anticipated d/c is to: Home              Anticipated d/c date is: 2 days              Patient currently is not medically stable to d/c. Barriers: Not Clinically Stable-   Code Status :  -  Code Status: Full Code   Family Communication:    NA (patient is alert, awake and coherent)   Consults  :  EDP Discussed with Oncologist   DVT Prophylaxis  :   - SCDs   enoxaparin (LOVENOX) injection 40 mg Start: 09/05/20 0600 SCDs Start: 09/04/20 2357    Lab Results  Component Value Date   PLT 243 09/05/2020    Inpatient Medications  Scheduled Meds:  acyclovir  400 mg Oral BID   aspirin  81 mg Oral Daily   calcium-vitamin D  1 tablet Oral Q breakfast   dextromethorphan-guaiFENesin  1 tablet Oral BID   enoxaparin (LOVENOX) injection  40 mg Subcutaneous Q24H   feeding supplement  237 mL Oral BID BM   multivitamin with minerals  1 tablet Oral Daily   rosuvastatin  40 mg Oral Daily   Continuous Infusions:  ceFEPime (MAXIPIME) IV Stopped (09/05/20  1731)   vancomycin     PRN Meds:.    Anti-infectives (From admission, onward)    Start     Dose/Rate Route Frequency Ordered Stop   09/05/20 2200  vancomycin (VANCOREADY) IVPB 1500 mg/300 mL        1,500 mg 150 mL/hr over 120 Minutes Intravenous Every 24 hours 09/05/20 0146     09/05/20 1000  acyclovir (ZOVIRAX) tablet 400 mg        400 mg Oral 2 times daily 09/05/20 0706     09/05/20 0600  ceFEPIme (MAXIPIME) 2 g in sodium chloride 0.9 % 100 mL IVPB        2 g 200 mL/hr over 30 Minutes Intravenous Every 8 hours 09/05/20 0424     09/04/20 2315  vancomycin (VANCOREADY) IVPB 1500 mg/300 mL        1,500 mg 150 mL/hr over 120 Minutes Intravenous  Once 09/04/20 2314 09/05/20 0226   09/04/20 2130  ceFEPIme (MAXIPIME) 2 g in sodium chloride 0.9 % 100 mL IVPB         2 g 200 mL/hr over 30 Minutes Intravenous  Once 09/04/20 2119 09/04/20 2215         Objective:   Vitals:   09/05/20 1700 09/05/20 1730 09/05/20 1736 09/05/20 1820  BP: 122/79 110/67  133/84  Pulse: 85 92  99  Resp: 20 (!) 24  (!) 24  Temp:   98.6 F (37 C) 98.8 F (37.1 C)  TempSrc:   Oral Oral  SpO2: 100% 99%  100%  Weight:      Height:        Wt Readings from Last 3 Encounters:  09/04/20 95.7 kg  09/03/20 95.8 kg  07/24/20 98.4 kg     Intake/Output Summary (Last 24 hours) at 09/05/2020 1829 Last data filed at 09/05/2020 1731 Gross per 24 hour  Intake 2501.36 ml  Output --  Net 2501.36 ml     Physical Exam  Gen:- Awake Alert,  in no apparent distress  HEENT:- [place].AT, No sclera icterus Neck-Supple Neck,No JVD,.  Lungs-diminished breath sounds, no wheezing  CV- S1, S2 normal, regular  Abd-  +ve B.Sounds, Abd Soft, No tenderness,    Extremity/Skin:- No  edema, pedal pulses present  Psych-affect is appropriate, oriented x3 Neuro-no new focal deficits, no tremors   Data Review:   Micro Results Recent Results (from the past 240 hour(s))  Resp Panel by RT-PCR (Flu A&B, Covid) Nasopharyngeal Swab     Status: None   Collection Time: 09/04/20  8:06 PM   Specimen: Nasopharyngeal Swab; Nasopharyngeal(NP) swabs in vial transport medium  Result Value Ref Range Status   SARS Coronavirus 2 by RT PCR NEGATIVE NEGATIVE Final    Comment: (NOTE) SARS-CoV-2 target nucleic acids are NOT DETECTED.  The SARS-CoV-2 RNA is generally detectable in upper respiratory specimens during the acute phase of infection. The lowest concentration of SARS-CoV-2 viral copies this assay can detect is 138 copies/mL. A negative result does not preclude SARS-Cov-2 infection and should not be used as the sole basis for treatment or other patient management decisions. A negative result may occur with  improper specimen collection/handling, submission of specimen other than nasopharyngeal swab,  presence of viral mutation(s) within the areas targeted by this assay, and inadequate number of viral copies(<138 copies/mL). A negative result must be combined with clinical observations, patient history, and epidemiological information. The expected result is Negative.  Fact Sheet for Patients:  EntrepreneurPulse.com.au  Fact Sheet for Healthcare  Providers:  IncredibleEmployment.be  This test is no t yet approved or cleared by the [place] and  has been authorized for detection and/or diagnosis of SARS-CoV-2 by FDA under an Emergency Use Authorization (EUA). This EUA will remain  in effect (meaning this test can be used) for the duration of the COVID-19 declaration under Section 564(b)(1) of the Act, 21 U.S.C.section 360bbb-3(b)(1), unless the authorization is terminated  or revoked sooner.       Influenza A by PCR NEGATIVE NEGATIVE Final   Influenza B by PCR NEGATIVE NEGATIVE Final    Comment: (NOTE) The Xpert Xpress SARS-CoV-2/FLU/RSV plus assay is intended as an aid in the diagnosis of influenza from Nasopharyngeal swab specimens and should not be used as a sole basis for treatment. Nasal washings and aspirates are unacceptable for Xpert Xpress SARS-CoV-2/FLU/RSV testing.  Fact Sheet for Patients: EntrepreneurPulse.com.au  Fact Sheet for Healthcare Providers: IncredibleEmployment.be  This test is not yet approved or cleared by the [place] FDA and has been authorized for detection and/or diagnosis of SARS-CoV-2 by FDA under an Emergency Use Authorization (EUA). This EUA will remain in effect (meaning this test can be used) for the duration of the COVID-19 declaration under Section 564(b)(1) of the Act, 21 U.S.C. section 360bbb-3(b)(1), unless the authorization is terminated or revoked.  Performed at [facility], [address] [place], [place] [address]   Culture, blood (Routine x  2)     Status: None (Preliminary result)   Collection Time: 09/04/20  8:11 PM   Specimen: Right Antecubital; Blood  Result Value Ref Range Status   Specimen Description RIGHT ANTECUBITAL  Final   Special Requests   Final    BOTTLES DRAWN AEROBIC AND ANAEROBIC Blood Culture results may not be optimal due to an excessive volume of blood received in culture bottles   Culture   Final    NO GROWTH < 12 HOURS Performed at [facility], [address] [place], [place] [address]    Report Status PENDING  Incomplete  Culture, blood (Routine x 2)     Status: None (Preliminary result)   Collection Time: 09/04/20  8:11 PM   Specimen: BLOOD LEFT HAND  Result Value Ref Range Status   Specimen Description BLOOD LEFT HAND  Final   Special Requests   Final    BOTTLES DRAWN AEROBIC AND ANAEROBIC Blood Culture adequate volume   Culture   Final    NO GROWTH < 12 HOURS Performed at [facility], [address], [place], [place] [address]    Report Status PENDING  Incomplete    Radiology Reports DG Chest 2 View  Result Date: 09/04/2020 CLINICAL DATA:  Fever, suspected sepsis EXAM: CHEST - 2 VIEW COMPARISON:  06/24/2018 FINDINGS: Frontal and lateral views of the chest demonstrate unremarkable cardiac silhouette. Lung volumes are diminished, patchy bibasilar consolidation greatest in the right infrahilar region. Findings could reflect hypoventilatory change or developing airspace disease. No effusion or pneumothorax. No acute bony abnormalities. IMPRESSION: 1. Low lung volumes, with patchy bibasilar airspace disease. Findings could reflect pneumonia given clinical presentation. Electronically Signed   By: Randa Ngo M.D.   On: 09/04/2020 21:09     CBC Recent Labs  Lab 09/03/20 0811 09/04/20 2011 09/05/20 0438  WBC 6.3 5.6 4.3  HGB 11.0* 10.7* 9.7*  HCT 34.7* 33.1* 30.5*  PLT 374 275 243  MCV 101.8* 99.4 100.3*  MCH 32.3 32.1 31.9  MCHC 31.7 32.3 31.8  RDW 13.5 13.8 13.9  LYMPHSABS 3.1  0.8  --    MONOABS 0.4 0.2  --   EOSABS 0.0 0.1  --   BASOSABS 0.0 0.0  --     Chemistries  Recent Labs  Lab 09/03/20 0811 09/04/20 2011 09/05/20 0438  NA 134* 132* 135  K 3.6 3.3* 3.4*  CL 102 101 107  CO2 26 23 21*  GLUCOSE 138* 124* 132*  BUN 22 27* 26*  CREATININE 1.05 1.13 1.11  CALCIUM 9.1 7.8* 7.5*  MG 1.7  --  1.5*  AST _0 ALT _1 ALKPHOS 83 67 58  BILITOT 0.6 0.7 0.6   ------------------------------------------------------------------------------------------------------------------ No results for input(s): CHOL, HDL, LDLCALC, TRIG, CHOLHDL, LDLDIRECT in the last 72 hours.  Lab Results  Component Value Date   HGBA1C 5.4 07/16/2020   ------------------------------------------------------------------------------------------------------------------ No results for input(s): TSH, T4TOTAL, T3FREE, THYROIDAB in the last 72 hours.  Invalid input(s): FREET3 ------------------------------------------------------------------------------------------------------------------ No results for input(s): VITAMINB12, FOLATE, FERRITIN, TIBC, IRON, RETICCTPCT in the last 72 hours.  Coagulation profile Recent Labs  Lab 09/04/20 2011 09/05/20 0438  INR 1.1 1.3*    No results for input(s): DDIMER in the last 72 hours.  Cardiac Enzymes No results for input(s): CKMB, TROPONINI, MYOGLOBIN in the last 168 hours.  Invalid input(s): CK ------------------------------------------------------------------------------------------------------------------ No results found for: BNP   Roxan Hockey M.D on 09/05/2020 at 6:29 PM  Go to [email or web address] - for contact info  [facility] - Office  [phone]

## 2020-09-05 NOTE — Progress Notes (Addendum)
Pharmacy Antibiotic Note  Eugene [name] is a 65 y.o. male admitted on 09/04/2020 with sepsis.  Pharmacy has been consulted for Vancomycin/Cefepime dosing. WBC WNL. Renal function ok. Multiple myeloma currently on chemo.   Plan: -Vancomycin 1500 mg IV q24h >>>Estimated AUC: 521 -Cefepime 2g IV q8h -Trend WBC, temp, renal function  -F/U infectious work-up -Drug levels as indicated   Height: 5' 9" (175.3 cm) Weight: 95.7 kg (211 lb) IBW/kg (Calculated) : 70.7  Temp (24hrs), Avg:100.6 F (38.1 C), Min:98.9 F (37.2 C), Max:102.9 F (39.4 C)  Recent Labs  Lab 09/03/20 0811 09/04/20 2011 09/05/20 0005  WBC 6.3 5.6  --   CREATININE 1.05 1.13  --   LATICACIDVEN  --  2.5* 1.2    Estimated Creatinine Clearance: 76.4 mL/min (by C-G formula based on SCr of 1.13 mg/dL).    No Known Allergies  James Ledford, PharmD, BCPS Clinical Pharmacist Phone: [phone]  

## 2020-09-05 NOTE — ED Notes (Signed)
ED TO INPATIENT HANDOFF REPORT  ED Nurse Name and Phone #:   S Name/Age/Gender Eugene [name] 65 y.o. male Room/Bed: APA14/APA14  Code Status   Code Status: Full Code  Home/SNF/Other Home Patient oriented to: self, place, time and situation Is this baseline? Yes   Triage Complete: Triage complete  Chief Complaint Acute febrile illness [R50.9]  Triage Note Pt received chemo yesterday.  Fever today and took tylenol about 20 min. Ago.  102.9 fever in triage. Denies any pain at present.     Allergies No Known Allergies  Level of Care/Admitting Diagnosis ED Disposition    ED Disposition  Admit   Condition  --   Comment  Hospital Area: [facility] [id number]  Level of Care: Med-Surg [16]  Covid Evaluation: Confirmed COVID Negative  Diagnosis: Acute febrile illness [644160]  Admitting Physician: Bernadette Hoit [id number]  Attending Physician: Bernadette Hoit [id number]  Estimated length of stay: past midnight tomorrow  Certification:: I certify this patient will need inpatient services for at least 2 midnights         B Medical/Surgery History Past Medical History:  Diagnosis Date  . Back pain   . CAD (coronary artery disease)    a. 01/2017 NSTEMI/Cath: LM nl, LAD 25p - ? hypodense but no filling defect, RI nl, LCX nl, RCA nl, EF 55-65%-->Med Rx.  . Erectile dysfunction   . History of echocardiogram    a. 01/2017 Echo: EF 55-60%, no rwma.  . Hypertension    Past Surgical History:  Procedure Laterality Date  . LEFT HEART CATH AND CORONARY ANGIOGRAPHY N/A 01/15/2017   Procedure: LEFT HEART CATH AND CORONARY ANGIOGRAPHY;  Surgeon: Martinique, Peter M, MD;  Location: [facility];  Service: Cardiovascular;  Laterality: N/A;  . ORIF trocanteric femoral IM Nail Right      A IV Location/Drains/Wounds Patient Lines/Drains/Airways Status    Active Line/Drains/Airways    Name Placement date Placement time Site Days   Peripheral IV 09/03/20 22 G Right  Antecubital 09/03/20  1057  Antecubital  2          Intake/Output Last 24 hours  Intake/Output Summary (Last 24 hours) at 09/05/2020 1614 Last data filed at 09/05/2020 0226 Gross per 24 hour  Intake 2401.36 ml  Output --  Net 2401.36 ml    Labs/Imaging Results for orders placed or performed during the hospital encounter of 09/04/20 (from the past 48 hour(s))  Resp Panel by RT-PCR (Flu A&B, Covid) Nasopharyngeal Swab     Status: None   Collection Time: 09/04/20  8:06 PM   Specimen: Nasopharyngeal Swab; Nasopharyngeal(NP) swabs in vial transport medium  Result Value Ref Range   SARS Coronavirus 2 by RT PCR NEGATIVE NEGATIVE    Comment: (NOTE) SARS-CoV-2 target nucleic acids are NOT DETECTED.  The SARS-CoV-2 RNA is generally detectable in upper respiratory specimens during the acute phase of infection. The lowest concentration of SARS-CoV-2 viral copies this assay can detect is 138 copies/mL. A negative result does not preclude SARS-Cov-2 infection and should not be used as the sole basis for treatment or other patient management decisions. A negative result may occur with  improper specimen collection/handling, submission of specimen other than nasopharyngeal swab, presence of viral mutation(s) within the areas targeted by this assay, and inadequate number of viral copies(<138 copies/mL). A negative result must be combined with clinical observations, patient history, and epidemiological information. The expected result is Negative.  Fact Sheet for Patients:  EntrepreneurPulse.com.au  Fact Sheet for Healthcare Providers:  IncredibleEmployment.be  This test is no t yet approved or cleared by the [place] and  has been authorized for detection and/or diagnosis of SARS-CoV-2 by FDA under an Emergency Use Authorization (EUA). This EUA will remain  in effect (meaning this test can be used) for the duration of the COVID-19 declaration  under Section 564(b)(1) of the Act, 21 U.S.C.section 360bbb-3(b)(1), unless the authorization is terminated  or revoked sooner.       Influenza A by PCR NEGATIVE NEGATIVE   Influenza B by PCR NEGATIVE NEGATIVE    Comment: (NOTE) The Xpert Xpress SARS-CoV-2/FLU/RSV plus assay is intended as an aid in the diagnosis of influenza from Nasopharyngeal swab specimens and should not be used as a sole basis for treatment. Nasal washings and aspirates are unacceptable for Xpert Xpress SARS-CoV-2/FLU/RSV testing.  Fact Sheet for Patients: EntrepreneurPulse.com.au  Fact Sheet for Healthcare Providers: IncredibleEmployment.be  This test is not yet approved or cleared by the [place] FDA and has been authorized for detection and/or diagnosis of SARS-CoV-2 by FDA under an Emergency Use Authorization (EUA). This EUA will remain in effect (meaning this test can be used) for the duration of the COVID-19 declaration under Section 564(b)(1) of the Act, 21 U.S.C. section 360bbb-3(b)(1), unless the authorization is terminated or revoked.  Performed at [facility], [address], [place], [place] [address]   Comprehensive metabolic panel     Status: Abnormal   Collection Time: 09/04/20  8:11 PM  Result Value Ref Range   Sodium 132 (L) 135 - 145 mmol/L   Potassium 3.3 (L) 3.5 - 5.1 mmol/L   Chloride 101 98 - 111 mmol/L   CO2 23 22 - 32 mmol/L   Glucose, Bld 124 (H) 70 - 99 mg/dL    Comment: Glucose reference range applies only to samples taken after fasting for at least 8 hours.   BUN 27 (H) 8 - 23 mg/dL   Creatinine, Ser 1.13 0.61 - 1.24 mg/dL   Calcium 7.8 (L) 8.9 - 10.3 mg/dL   Total Protein 8.8 (H) 6.5 - 8.1 g/dL   Albumin 3.2 (L) 3.5 - 5.0 g/dL   AST 31 15 - 41 U/L   ALT 24 0 - 44 U/L   Alkaline Phosphatase 67 38 - 126 U/L   Total Bilirubin 0.7 0.3 - 1.2 mg/dL   GFR, Estimated >60 >60 mL/min    Comment: (NOTE) Calculated using the CKD-EPI  Creatinine Equation (2021)    Anion gap 8 5 - 15    Comment: Performed at [facility], [address], [place], [place] [address]  Lactic acid, plasma     Status: Abnormal   Collection Time: 09/04/20  8:11 PM  Result Value Ref Range   Lactic Acid, Venous 2.5 (HH) 0.5 - 1.9 mmol/L    Comment: CRITICAL RESULT CALLED TO, READ BACK BY AND VERIFIED WITH: WARD,R ON 09/04/20 AT 2140 BY LOY,C Performed at [facility], [address] [place], [place] [address]   CBC with Differential     Status: Abnormal   Collection Time: 09/04/20  8:11 PM  Result Value Ref Range   WBC 5.6 4.0 - 10.5 K/uL   RBC 3.33 (L) 4.22 - 5.81 MIL/uL   Hemoglobin 10.7 (L) 13.0 - 17.0 g/dL   HCT 33.1 (L) 39.0 - 52.0 %   MCV 99.4 80.0 - 100.0 fL   MCH 32.1 26.0 - 34.0 pg   MCHC 32.3 30.0 - 36.0 g/dL   RDW 13.8 11.5 -  15.5 %   Platelets 275 150 - 400 K/uL   nRBC 0.0 0.0 - 0.2 %   Neutrophils Relative % 82 %   Neutro Abs 4.6 1.7 - 7.7 K/uL   Lymphocytes Relative 14 %   Lymphs Abs 0.8 0.7 - 4.0 K/uL   Monocytes Relative 3 %   Monocytes Absolute 0.2 0.1 - 1.0 K/uL   Eosinophils Relative 1 %   Eosinophils Absolute 0.1 0.0 - 0.5 K/uL   Basophils Relative 0 %   Basophils Absolute 0.0 0.0 - 0.1 K/uL   WBC Morphology DOHLE BODIES     Comment: TOXIC GRANULATION   Immature Granulocytes 0 %   Abs Immature Granulocytes 0.01 0.00 - 0.07 K/uL   Reactive, Benign Lymphocytes PRESENT     Comment: Performed at [facility], [address], [place], [place] [address]  Protime-INR     Status: None   Collection Time: 09/04/20  8:11 PM  Result Value Ref Range   Prothrombin Time 14.6 11.4 - 15.2 seconds   INR 1.1 0.8 - 1.2    Comment: (NOTE) INR goal varies based on device and disease states. Performed at [facility], [address] [place], [place] [address]   Culture, blood (Routine x 2)     Status: None (Preliminary result)   Collection Time: 09/04/20  8:11 PM   Specimen: Right Antecubital; Blood  Result Value Ref Range    Specimen Description RIGHT ANTECUBITAL    Special Requests      BOTTLES DRAWN AEROBIC AND ANAEROBIC Blood Culture results may not be optimal due to an excessive volume of blood received in culture bottles   Culture      NO GROWTH < 12 HOURS Performed at [facility], [address] [place], [place] [address]    Report Status PENDING   Culture, blood (Routine x 2)     Status: None (Preliminary result)   Collection Time: 09/04/20  8:11 PM   Specimen: BLOOD LEFT HAND  Result Value Ref Range   Specimen Description BLOOD LEFT HAND    Special Requests      BOTTLES DRAWN AEROBIC AND ANAEROBIC Blood Culture adequate volume   Culture      NO GROWTH < 12 HOURS Performed at [facility], [address], [place], [place] [address]    Report Status PENDING   Urinalysis, Routine w reflex microscopic     Status: Abnormal   Collection Time: 09/04/20 10:25 PM  Result Value Ref Range   Color, Urine YELLOW YELLOW   APPearance HAZY (A) CLEAR   Specific Gravity, Urine 1.030 1.005 - 1.030   pH 5.0 5.0 - 8.0   Glucose, UA NEGATIVE NEGATIVE mg/dL   Hgb urine dipstick NEGATIVE NEGATIVE   Bilirubin Urine NEGATIVE NEGATIVE   Ketones, ur 5 (A) NEGATIVE mg/dL   Protein, ur 30 (A) NEGATIVE mg/dL   Nitrite NEGATIVE NEGATIVE   Leukocytes,Ua NEGATIVE NEGATIVE   RBC / HPF 0-5 0 - 5 RBC/hpf   WBC, UA 0-5 0 - 5 WBC/hpf   Bacteria, UA NONE SEEN NONE SEEN   Mucus PRESENT     Comment: Performed at [facility], [address], [place], [place] [address]  Lactic acid, plasma     Status: None   Collection Time: 09/05/20 12:05 AM  Result Value Ref Range   Lactic Acid, Venous 1.2 0.5 - 1.9 mmol/L    Comment: Performed at [facility], [address] [place],  [address]  HIV Antibody (routine testing w  rflx)     Status: None   Collection Time: 09/05/20  4:38 AM  Result Value Ref Range   HIV Screen 4th Generation wRfx Non Reactive Non Reactive    Comment: Performed at [facility], [address] [place], [place] [address]  Comprehensive metabolic panel     Status: Abnormal   Collection Time: 09/05/20  4:38 AM  Result Value Ref Range   Sodium 135 135 - 145 mmol/L   Potassium 3.4 (L) 3.5 - 5.1 mmol/L   Chloride 107 98 - 111 mmol/L   CO2 21 (L) 22 - 32 mmol/L   Glucose, Bld 132 (H) 70 - 99 mg/dL    Comment: Glucose reference range applies only to samples taken after fasting for at least 8 hours.   BUN 26 (H) 8 - 23 mg/dL   Creatinine, Ser 1.11 0.61 - 1.24 mg/dL   Calcium 7.5 (L) 8.9 - 10.3 mg/dL   Total Protein 7.4 6.5 - 8.1 g/dL   Albumin 2.8 (L) 3.5 - 5.0 g/dL   AST 25 15 - 41 U/L   ALT 22 0 - 44 U/L   Alkaline Phosphatase 58 38 - 126 U/L   Total Bilirubin 0.6 0.3 - 1.2 mg/dL   GFR, Estimated >60 >60 mL/min    Comment: (NOTE) Calculated using the CKD-EPI Creatinine Equation (2021)    Anion gap 7 5 - 15    Comment: Performed at [facility], [address], [place], [place] [address]  CBC     Status: Abnormal   Collection Time: 09/05/20  4:38 AM  Result Value Ref Range   WBC 4.3 4.0 - 10.5 K/uL   RBC 3.04 (L) 4.22 - 5.81 MIL/uL   Hemoglobin 9.7 (L) 13.0 - 17.0 g/dL   HCT 30.5 (L) 39.0 - 52.0 %   MCV 100.3 (H) 80.0 - 100.0 fL   MCH 31.9 26.0 - 34.0 pg   MCHC 31.8 30.0 - 36.0 g/dL   RDW 13.9 11.5 - 15.5 %   Platelets 243 150 - 400 K/uL   nRBC 0.0 0.0 - 0.2 %    Comment: Performed at [facility], [address] [place], [place] [address]  Protime-INR     Status: Abnormal   Collection Time: 09/05/20  4:38 AM  Result Value Ref Range   Prothrombin Time 16.0 (H) 11.4 - 15.2 seconds   INR 1.3 (H) 0.8 - 1.2    Comment: (NOTE) INR goal varies based on device and disease states. Performed at [facility], [address], [place], [place] [address]   APTT     Status: None   Collection Time: 09/05/20  4:38 AM  Result Value Ref Range   aPTT 28 24 - 36 seconds    Comment: Performed at [facility], [address], [place], [place] [address]  Magnesium     Status: Abnormal    Collection Time: 09/05/20  4:38 AM  Result Value Ref Range   Magnesium 1.5 (L) 1.7 - 2.4 mg/dL    Comment: Performed at [facility], [address] [place], [place] [address]  Phosphorus     Status: Abnormal   Collection Time: 09/05/20  4:38 AM  Result Value Ref Range   Phosphorus 2.2 (L) 2.5 - 4.6 mg/dL    Comment: Performed at [facility], [address] [place], [place] [address]  Procalcitonin - Baseline     Status: None   Collection Time: 09/05/20  4:38 AM  Result Value Ref Range  Procalcitonin 8.66 ng/mL    Comment:        Interpretation: PCT > 2 ng/mL: Systemic infection (sepsis) is likely, unless other causes are known. (NOTE)       Sepsis PCT Algorithm           Lower Respiratory Tract                                      Infection PCT Algorithm    ----------------------------     ----------------------------         PCT < 0.25 ng/mL                PCT < 0.10 ng/mL          Strongly encourage             Strongly discourage   discontinuation of antibiotics    initiation of antibiotics    ----------------------------     -----------------------------       PCT 0.25 - 0.50 ng/mL            PCT 0.10 - 0.25 ng/mL               OR       >80% decrease in PCT            Discourage initiation of                                            antibiotics      Encourage discontinuation           of antibiotics    ----------------------------     -----------------------------         PCT >= 0.50 ng/mL              PCT 0.26 - 0.50 ng/mL               AND       <80% decrease in PCT              Encourage initiation of                                             antibiotics       Encourage continuation           of antibiotics    ----------------------------     -----------------------------        PCT >= 0.50 ng/mL                  PCT > 0.50 ng/mL               AND         increase in PCT                  Strongly encourage                                      initiation of  antibiotics    Strongly encourage escalation           of antibiotics                                     -----------------------------  PCT <= 0.25 ng/mL                                                 OR                                        > 80% decrease in PCT                                      Discontinue / Do not initiate                                             antibiotics  Performed at [facility], [address], [place], [place] [address]    DG Chest 2 View  Result Date: 09/04/2020 CLINICAL DATA:  Fever, suspected sepsis EXAM: CHEST - 2 VIEW COMPARISON:  06/24/2018 FINDINGS: Frontal and lateral views of the chest demonstrate unremarkable cardiac silhouette. Lung volumes are diminished, patchy bibasilar consolidation greatest in the right infrahilar region. Findings could reflect hypoventilatory change or developing airspace disease. No effusion or pneumothorax. No acute bony abnormalities. IMPRESSION: 1. Low lung volumes, with patchy bibasilar airspace disease. Findings could reflect pneumonia given clinical presentation. Electronically Signed   By: Randa Ngo M.D.   On: 09/04/2020 21:09    Pending Labs Unresulted Labs (From admission, onward)    Start     Ordered   09/11/20 0500  Creatinine, serum  (enoxaparin (LOVENOX)    CrCl >/= 30 ml/min)  Weekly,   R     Comments: while on enoxaparin therapy    09/04/20 2357   09/06/20 0500  Creatinine, serum  Every Mon-Wed-Fri (0500),   R      09/05/20 1028   09/05/20 0424  Legionella Pneumophila Serogp 1 Ur Ag  Once,   STAT        09/05/20 0423   09/05/20 0423  Strep pneumoniae urinary antigen  Once,   STAT        09/05/20 0423          Vitals/Pain Today's Vitals   09/05/20 1130 09/05/20 1200 09/05/20 1230 09/05/20 1500  BP: 114/67 (!) 107/54 114/63 128/70  Pulse: 80 81 89 83  Resp: '20 13  19  '$ Temp:      TempSrc:      SpO2: 100% 100% 100% 100%  Weight:      Height:       PainSc:        Isolation Precautions Airborne and Contact precautions  Medications Medications  enoxaparin (LOVENOX) injection 40 mg (40 mg Subcutaneous Given 09/05/20 0627)  vancomycin (VANCOREADY) IVPB 1500 mg/300 mL (has no administration in time range)  ceFEPIme (MAXIPIME) 2 g in sodium chloride 0.9 % 100 mL IVPB (2 g Intravenous New Bag/Given 09/05/20 1402)  dextromethorphan-guaiFENesin (MUCINEX DM) 30-600 MG per 12 hr tablet 1 tablet (1 tablet Oral Patient Refused/Not Given 09/05/20 0954)  aspirin chewable tablet 81 mg (81 mg Oral Given 09/05/20 0920)  rosuvastatin (CRESTOR) tablet 40 mg (40 mg Oral Patient Refused/Not Given 09/05/20 0955)  calcium-vitamin D (OSCAL WITH D) 500-200 MG-UNIT per tablet 1 tablet (1 tablet Oral Given 09/05/20 0920)  multivitamin with minerals tablet 1 tablet (1 tablet Oral Given 09/05/20 0921)  acyclovir (ZOVIRAX) tablet 400 mg (400 mg Oral Patient Refused/Not Given 09/05/20 0955)  feeding supplement (ENSURE ENLIVE / ENSURE PLUS) liquid 237 mL (237 mLs Oral Patient Refused/Not Given 09/05/20 1403)  sodium chloride 0.9 % bolus 1,000 mL (0 mLs Intravenous Stopped 09/04/20 2139)  ibuprofen (ADVIL) tablet 600 mg (600 mg Oral Given 09/04/20 1953)  ceFEPIme (MAXIPIME) 2 g in sodium chloride 0.9 % 100 mL IVPB (0 g Intravenous Stopped 09/04/20 2215)  sodium chloride 0.9 % bolus 1,000 mL (0 mLs Intravenous Stopped 09/05/20 0017)  vancomycin (VANCOREADY) IVPB 1500 mg/300 mL (0 mg Intravenous Stopped 09/05/20 0226)    Mobility walks Low [name] risk   Focused Assessments    R Recommendations: See Admitting Provider Note  Report given to:   Additional Notes:

## 2020-09-06 ENCOUNTER — Inpatient Hospital Stay (HOSPITAL_COMMUNITY): Payer: 59

## 2020-09-06 DIAGNOSIS — R509 Fever, unspecified: Secondary | ICD-10-CM | POA: Diagnosis not present

## 2020-09-06 LAB — BASIC METABOLIC PANEL
Anion gap: 6 (ref 5–15)
BUN: 19 mg/dL (ref 8–23)
CO2: 24 mmol/L (ref 22–32)
Calcium: 7.9 mg/dL — ABNORMAL LOW (ref 8.9–10.3)
Chloride: 106 mmol/L (ref 98–111)
Creatinine, Ser: 1.01 mg/dL (ref 0.61–1.24)
GFR, Estimated: >60 mL/min (ref >60)
Glucose, Bld: 118 mg/dL — ABNORMAL HIGH (ref 70–99)
Potassium: 3.8 mmol/L (ref 3.5–5.1)
Sodium: 136 mmol/L (ref 135–145)

## 2020-09-06 LAB — MRSA NEXT GEN BY PCR, NASAL: MRSA by PCR Next Gen: NOT DETECTED

## 2020-09-06 LAB — CBC
HCT: 30.7 % — ABNORMAL LOW (ref 39.0–52.0)
Hemoglobin: 9.8 g/dL — ABNORMAL LOW (ref 13.0–17.0)
MCH: 32.5 pg (ref 26.0–34.0)
MCHC: 31.9 g/dL (ref 30.0–36.0)
MCV: 101.7 fL — ABNORMAL HIGH (ref 80.0–100.0)
Platelets: 213 10*3/uL (ref 150–400)
RBC: 3.02 MIL/uL — ABNORMAL LOW (ref 4.22–5.81)
RDW: 14.1 % (ref 11.5–15.5)
WBC: 3.9 10*3/uL — ABNORMAL LOW (ref 4.0–10.5)
nRBC: 0 % (ref 0.0–0.2)

## 2020-09-06 NOTE — Progress Notes (Signed)
Patient Demographics:    Eugene [name], is a 65 y.o. male, DOB - 11/15/1955, ULA:[id number]  Admit date - 09/04/2020   Admitting Physician Bernadette Hoit, DO  Outpatient Primary MD for the patient is Eugene Spar, MD  LOS - 2   Chief Complaint  Patient presents with   Fever        Subjective:    Eugene [name] today has   no emesis,  No chest pain,  -No further fevers at this time  Assessment  & Plan :    Principal Problem:   Acute febrile illness Active Problems:   Obesity   CAD (coronary artery disease)   Essential hypertension   Multiple myeloma without remission (HCC)   Mixed hyperlipidemia   Hypokalemia   Hypoalbuminemia   Normocytic anemia   Lactic acidosis  Brief Summary:- 65 y.o. male with medical history significant for multiple myeloma, hypertension, hyperlipidemia, CAD admitted on 09/04/2020 with febrile illness in the setting of ongoing chemotherapy  A/p 1) acute febrile illness/SIRS- on admission patient had T-max of 102.9, tachycardia and tachypnea PCT is 8.66 -Repeat chest x-ray w/o acute findings -Patient is immunocompromise due to ongoing chemotherapy -Continue Vanco and cefepime pending further culture data Lactic acid 2.5 >>1.2 -Fever curve trended down--with T-max of 100.4 over the last 24 hours  2)Multiple myeloma without remission -Took chemotherapy on 09/03/2020  -patient is being treated with daratumumab, bortezomib and zoledronic acid Patient follows with Dr. Alvy Bimler, [place] and Dr. Delton Coombes  3) normocytic chronic anemia of malignancy and chemoRx--patient with neoplastic stable anemia -Hemoglobin greater than 9 which is close to patient's baseline  4)H/o CAD-no ACS type symptoms, continue Crestor and aspirin -Hold metoprolol until BP improves  5) hypokalemia--- replace and recheck  Disposition/Need for in-Hospital Stay- patient unable to be discharged at  this time due to --febrile illness in an Immunocompromised patient --continue IV Vanco and cefepime pending culture data*  Status is: Inpatient  Remains inpatient appropriate because: Please see disposition above  Disposition: The patient is from: Home              Anticipated d/c is to: Home              Anticipated d/c date is: 2 days              Patient currently is not medically stable to d/c. Barriers: Not Clinically Stable-   Code Status :  -  Code Status: Full Code   Family Communication:     (patient is alert, awake and coherent)  Discussed with patient's girlfriend at bedside at patient's request  Consults  :  EDP Discussed with Oncologist   DVT Prophylaxis  :   - SCDs   enoxaparin (LOVENOX) injection 40 mg Start: 09/05/20 0600 SCDs Start: 09/04/20 2357    Lab Results  Component Value Date   PLT 213 09/06/2020    Inpatient Medications  Scheduled Meds:  acyclovir  400 mg Oral BID   aspirin  81 mg Oral Daily   calcium-vitamin D  1 tablet Oral Q breakfast   dextromethorphan-guaiFENesin  1 tablet Oral BID   enoxaparin (LOVENOX) injection  40 mg Subcutaneous Q24H   feeding supplement  237 mL Oral BID BM   multivitamin  with minerals  1 tablet Oral Daily   rosuvastatin  40 mg Oral Daily   Continuous Infusions:  ceFEPime (MAXIPIME) IV 2 g (09/06/20 1311)   vancomycin 1,500 mg (09/05/20 2223)   PRN Meds:.    Anti-infectives (From admission, onward)    Start     Dose/Rate Route Frequency Ordered Stop   09/05/20 2200  vancomycin (VANCOREADY) IVPB 1500 mg/300 mL        1,500 mg 150 mL/hr over 120 Minutes Intravenous Every 24 hours 09/05/20 0146     09/05/20 1000  acyclovir (ZOVIRAX) tablet 400 mg        400 mg Oral 2 times daily 09/05/20 0706     09/05/20 0600  ceFEPIme (MAXIPIME) 2 g in sodium chloride 0.9 % 100 mL IVPB        2 g 200 mL/hr over 30 Minutes Intravenous Every 8 hours 09/05/20 0424     09/04/20 2315  vancomycin (VANCOREADY) IVPB 1500 mg/300 mL         1,500 mg 150 mL/hr over 120 Minutes Intravenous  Once 09/04/20 2314 09/05/20 0226   09/04/20 2130  ceFEPIme (MAXIPIME) 2 g in sodium chloride 0.9 % 100 mL IVPB        2 g 200 mL/hr over 30 Minutes Intravenous  Once 09/04/20 2119 09/04/20 2215         Objective:   Vitals:   09/05/20 2052 09/06/20 0408 09/06/20 0700 09/06/20 1340  BP: 126/67 129/81 100/64 119/67  Pulse: (!) 108 86 78 83  Resp: $Remo'20 19 16 17  'FCaMY$ Temp: (!) 100.4 F (38 C) 98 F (36.7 C) 98.2 F (36.8 C) 98.3 F (36.8 C)  TempSrc: Oral  Oral Oral  SpO2: 100% 100% 100% 100%  Weight:      Height:        Wt Readings from Last 3 Encounters:  09/04/20 95.7 kg  09/03/20 95.8 kg  07/24/20 98.4 kg     Intake/Output Summary (Last 24 hours) at 09/06/2020 1829 Last data filed at 09/06/2020 1808 Gross per 24 hour  Intake 720 ml  Output --  Net 720 ml    Physical Exam  Gen:- Awake Alert,  in no apparent distress  HEENT:- [place].AT, No sclera icterus Neck-Supple Neck,No JVD,.  Lungs-improved air movement,, no wheezing  CV- S1, S2 normal, regular  Abd-  +ve B.Sounds, Abd Soft, No tenderness,    Extremity/Skin:- No  edema, pedal pulses present  Psych-affect is appropriate, oriented x3 Neuro-no new focal deficits, no tremors   Data Review:   Micro Results Recent Results (from the past 240 hour(s))  Resp Panel by RT-PCR (Flu A&B, Covid) Nasopharyngeal Swab     Status: None   Collection Time: 09/04/20  8:06 PM   Specimen: Nasopharyngeal Swab; Nasopharyngeal(NP) swabs in vial transport medium  Result Value Ref Range Status   SARS Coronavirus 2 by RT PCR NEGATIVE NEGATIVE Final    Comment: (NOTE) SARS-CoV-2 target nucleic acids are NOT DETECTED.  The SARS-CoV-2 RNA is generally detectable in upper respiratory specimens during the acute phase of infection. The lowest concentration of SARS-CoV-2 viral copies this assay can detect is 138 copies/mL. A negative result does not preclude SARS-Cov-2 infection and  should not be used as the sole basis for treatment or other patient management decisions. A negative result may occur with  improper specimen collection/handling, submission of specimen other than nasopharyngeal swab, presence of viral mutation(s) within the areas targeted by this assay, and inadequate number of viral  copies(<138 copies/mL). A negative result must be combined with clinical observations, patient history, and epidemiological information. The expected result is Negative.  Fact Sheet for Patients:  EntrepreneurPulse.com.au  Fact Sheet for Healthcare Providers:  IncredibleEmployment.be  This test is no t yet approved or cleared by the [place] FDA and  has been authorized for detection and/or diagnosis of SARS-CoV-2 by FDA under an Emergency Use Authorization (EUA). This EUA will remain  in effect (meaning this test can be used) for the duration of the COVID-19 declaration under Section 564(b)(1) of the Act, 21 U.S.C.section 360bbb-3(b)(1), unless the authorization is terminated  or revoked sooner.       Influenza A by PCR NEGATIVE NEGATIVE Final   Influenza B by PCR NEGATIVE NEGATIVE Final    Comment: (NOTE) The Xpert Xpress SARS-CoV-2/FLU/RSV plus assay is intended as an aid in the diagnosis of influenza from Nasopharyngeal swab specimens and should not be used as a sole basis for treatment. Nasal washings and aspirates are unacceptable for Xpert Xpress SARS-CoV-2/FLU/RSV testing.  Fact Sheet for Patients: EntrepreneurPulse.com.au  Fact Sheet for Healthcare Providers: IncredibleEmployment.be  This test is not yet approved or cleared by the [place] FDA and has been authorized for detection and/or diagnosis of SARS-CoV-2 by FDA under an Emergency Use Authorization (EUA). This EUA will remain in effect (meaning this test can be used) for the duration of the COVID-19 declaration  under Section 564(b)(1) of the Act, 21 U.S.C. section 360bbb-3(b)(1), unless the authorization is terminated or revoked.  Performed at [facility], [address], [place], [place] [address]   Culture, blood (Routine x 2)     Status: None (Preliminary result)   Collection Time: 09/04/20  8:11 PM   Specimen: Right Antecubital; Blood  Result Value Ref Range Status   Specimen Description RIGHT ANTECUBITAL  Final   Special Requests   Final    BOTTLES DRAWN AEROBIC AND ANAEROBIC Blood Culture results may not be optimal due to an excessive volume of blood received in culture bottles   Culture   Final    NO GROWTH 2 DAYS Performed at [facility], [address] [place], [place] [address]    Report Status PENDING  Incomplete  Culture, blood (Routine x 2)     Status: None (Preliminary result)   Collection Time: 09/04/20  8:11 PM   Specimen: BLOOD LEFT HAND  Result Value Ref Range Status   Specimen Description BLOOD LEFT HAND  Final   Special Requests   Final    BOTTLES DRAWN AEROBIC AND ANAEROBIC Blood Culture adequate volume   Culture   Final    NO GROWTH 2 DAYS Performed at [facility], [address], [place], [place] [address]    Report Status PENDING  Incomplete  MRSA Next Gen by PCR, Nasal     Status: None   Collection Time: 09/06/20  8:55 AM   Specimen: Nasal Mucosa; Nasal Swab  Result Value Ref Range Status   MRSA by PCR Next Gen NOT DETECTED NOT DETECTED Final    Comment: (NOTE) The GeneXpert MRSA Assay (FDA approved for NASAL specimens only), is one component of a comprehensive MRSA colonization surveillance program. It is not intended to diagnose MRSA infection nor to guide or monitor treatment for MRSA infections. Test performance is not FDA approved in patients less than 57 years old. Performed at [facility], [address] [place], [place] [address]     Radiology Reports DG Chest 2 View  Result Date: 09/06/2020 CLINICAL  DATA:  Dyspnea and respiratory  abnormalities.  Fever. EXAM: CHEST - 2 VIEW COMPARISON:  09/04/2020 and 06/24/2018 FINDINGS: Coarse lung markings have minimally changed from the prior examinations. Heart size is within normal limits and stable. No large pleural effusions. No acute bone abnormality. IMPRESSION: Chronic lung changes without acute findings. Electronically Signed   By: Markus Daft M.D.   On: 09/06/2020 08:39   DG Chest 2 View  Result Date: 09/04/2020 CLINICAL DATA:  Fever, suspected sepsis EXAM: CHEST - 2 VIEW COMPARISON:  06/24/2018 FINDINGS: Frontal and lateral views of the chest demonstrate unremarkable cardiac silhouette. Lung volumes are diminished, patchy bibasilar consolidation greatest in the right infrahilar region. Findings could reflect hypoventilatory change or developing airspace disease. No effusion or pneumothorax. No acute bony abnormalities. IMPRESSION: 1. Low lung volumes, with patchy bibasilar airspace disease. Findings could reflect pneumonia given clinical presentation. Electronically Signed   By: Randa Ngo M.D.   On: 09/04/2020 21:09     CBC Recent Labs  Lab 09/03/20 0811 09/04/20 2011 09/05/20 0438 09/06/20 0544  WBC 6.3 5.6 4.3 3.9*  HGB 11.0* 10.7* 9.7* 9.8*  HCT 34.7* 33.1* 30.5* 30.7*  PLT 374 275 243 213  MCV 101.8* 99.4 100.3* 101.7*  MCH 32.3 32.1 31.9 32.5  MCHC 31.7 32.3 31.8 31.9  RDW 13.5 13.8 13.9 14.1  LYMPHSABS 3.1 0.8  --   --   MONOABS 0.4 0.2  --   --   EOSABS 0.0 0.1  --   --   BASOSABS 0.0 0.0  --   --     Chemistries  Recent Labs  Lab 09/03/20 0811 09/04/20 2011 09/05/20 0438 09/06/20 0544  NA 134* 132* 135 136  K 3.6 3.3* 3.4* 3.8  CL 102 101 107 106  CO2 26 23 21* 24  GLUCOSE 138* 124* 132* 118*  BUN 22 27* 26* 19  CREATININE 1.05 1.13 1.11 1.01  CALCIUM 9.1 7.8* 7.5* 7.9*  MG 1.7  --  1.5*  --   AST $Re'20 31 25  'Ool$ --   ALT $Re'17 24 22  'sqq$ --   ALKPHOS 83 67 58  --   BILITOT 0.6 0.7 0.6  --     ------------------------------------------------------------------------------------------------------------------ No results for input(s): CHOL, HDL, LDLCALC, TRIG, CHOLHDL, LDLDIRECT in the last 72 hours.  Lab Results  Component Value Date   HGBA1C 5.4 07/16/2020   ------------------------------------------------------------------------------------------------------------------ No results for input(s): TSH, T4TOTAL, T3FREE, THYROIDAB in the last 72 hours.  Invalid input(s): FREET3 ------------------------------------------------------------------------------------------------------------------ No results for input(s): VITAMINB12, FOLATE, FERRITIN, TIBC, IRON, RETICCTPCT in the last 72 hours.  Coagulation profile Recent Labs  Lab 09/04/20 2011 09/05/20 0438  INR 1.1 1.3*    No results for input(s): DDIMER in the last 72 hours.  Cardiac Enzymes No results for input(s): CKMB, TROPONINI, MYOGLOBIN in the last 168 hours.  Invalid input(s): CK ------------------------------------------------------------------------------------------------------------------ No results found for: BNP   Roxan Hockey M.D on 09/06/2020 at 6:29 PM  Go to [email or web address] - for contact info  [facility] - Office  [phone]

## 2020-09-07 DIAGNOSIS — R509 Fever, unspecified: Secondary | ICD-10-CM | POA: Diagnosis not present

## 2020-09-07 LAB — CBC
HCT: 30.4 % — ABNORMAL LOW (ref 39.0–52.0)
Hemoglobin: 9.8 g/dL — ABNORMAL LOW (ref 13.0–17.0)
MCH: 32.5 pg (ref 26.0–34.0)
MCHC: 32.2 g/dL (ref 30.0–36.0)
MCV: 100.7 fL — ABNORMAL HIGH (ref 80.0–100.0)
Platelets: 222 10*3/uL (ref 150–400)
RBC: 3.02 MIL/uL — ABNORMAL LOW (ref 4.22–5.81)
RDW: 13.9 % (ref 11.5–15.5)
WBC: 4.8 10*3/uL (ref 4.0–10.5)
nRBC: 0 % (ref 0.0–0.2)

## 2020-09-07 MED ORDER — ASPIRIN EC 81 MG PO TBEC: 81.0000 mg | DELAYED_RELEASE_TABLET | Freq: Every day | ORAL | 5 refills | Status: AC

## 2020-09-07 MED ORDER — CEFDINIR 300 MG PO CAPS: 300.0000 mg | ORAL_CAPSULE | Freq: Two times a day (BID) | ORAL | 0 refills | Status: AC

## 2020-09-07 MED ORDER — SODIUM CHLORIDE 0.9 % IV SOLN
2.0000 g | Freq: Once | INTRAVENOUS | Status: AC
  Administered 2020-09-07: 2 g via INTRAVENOUS
  Filled 2020-09-07: qty 20

## 2020-09-07 NOTE — Discharge Instructions (Signed)
1) please take Omnicef/cefdinir antibiotic twice daily for 4 days starting on Sunday 09/08/2020 2) please follow-up with your oncologist within a week for repeat CBC blood test 3) please call or return if persistent fevers or chills or night sweats

## 2020-09-07 NOTE — Discharge Summary (Signed)
Eugene [name], is a 65 y.o. male  DOB 04-24-1955  MRN [id number].  Admission date:  09/04/2020  Admitting Physician  Bernadette Hoit, DO  Discharge Date:  09/07/2020   Primary MD  [name], MD  Recommendations for primary care physician for things to follow:   1) please take Omnicef/cefdinir antibiotic twice daily for 4 days starting on Sunday 09/08/2020 2) please follow-up with your oncologist within a week for repeat CBC blood test 3) please call or return if persistent fevers or chills or night sweats   Admission Diagnosis  Acute febrile illness [R50.9] Community acquired pneumonia, unspecified laterality [J18.9]   Discharge Diagnosis  Acute febrile illness [R50.9] Community acquired pneumonia, unspecified laterality [J18.9]    Principal Problem:   Acute febrile illness Active Problems:   Obesity   CAD (coronary artery disease)   Essential hypertension   Multiple myeloma without remission ([place])   Mixed hyperlipidemia   Hypokalemia   Hypoalbuminemia   Normocytic anemia   Lactic acidosis      Past Medical History:  Diagnosis Date   Back pain    CAD (coronary artery disease)    a. 01/2017 NSTEMI/Cath: LM nl, LAD 25p - ? hypodense but no filling defect, RI nl, LCX nl, RCA nl, EF 55-65%-->Med Rx.   Erectile dysfunction    History of echocardiogram    a. 01/2017 Echo: EF 55-60%, no rwma.   Hypertension     Past Surgical History:  Procedure Laterality Date   LEFT HEART CATH AND CORONARY ANGIOGRAPHY N/A 01/15/2017   Procedure: LEFT HEART CATH AND CORONARY ANGIOGRAPHY;  Surgeon: Martinique, Peter M, MD;  Location: [facility];  Service: Cardiovascular;  Laterality: N/A;   ORIF trocanteric femoral IM Nail Right      HPI  from the history and physical done on the day of admission:      Chief Complaint: Fever   HPI: Eugene [name] is a 65 y.o. male with medical history significant  for multiple myeloma, hypertension, hyperlipidemia, CAD who presents to the emergency department due to fever at home today.  Patient states that he had his first chemotherapy yesterday (8/24) and that today, he spiked fever to 102F, on a second check of temperature, he was still febrile with a temperature of 103F, so he decided to go to the ED for further evaluation and management.  He denies headache, chest pain, shortness of breath, nausea, vomiting, abdominal pain.   ED Course:  In the emergency department, he was febrile with a temperature of 102.57F, other vital signs are within normal range.   Work-up in the ED showed normocytic anemia, hyponatremia, hypokalemia, hypoalbuminemia, lactic acid 2.5 > 1.2, kappa free light chain 126.6, kappa lambda light 10.38.  Urinalysis was unimpressive for UTI.  Influenza A, B, SARS coronavirus 2 was negative. Chest x-ray showed low lung volumes, with patchy bibasilar airspace disease. Findings could reflect pneumonia given clinical presentation. Patient was treated with IV vancomycin and cefepime, IV hydration was provided, Advil was given.  Hospitalist  was asked to admit patient for further evaluation and management.     Hospital Course:    Brief Summary:- 65 y.o. male with medical history significant for multiple myeloma, hypertension, hyperlipidemia, CAD admitted on 09/04/2020 with febrile illness in the setting of ongoing chemotherapy and Radiation Rx   A/p 1) acute febrile illness/SIRS- on admission patient had T-max of 102.9, tachycardia and tachypnea PCT is 8.66 -Repeat chest x-ray w/o acute findings -Patient is immunocompromise due to ongoing chemotherapy and Radiation Rx -Patient was treated with Vanco and cefepime pending further culture data Lactic acid 2.5 >>1.2 -Fever curve trended down-- -Fever eventually resolved -Cultures remain negative okay to discharge on Omnicef   2)Multiple myeloma without remission -Took chemotherapy on  09/03/2020  -patient is being treated with daratumumab, bortezomib and zoledronic acid Patient follows with Dr. Alvy Bimler, Ni and Dr. Delton Coombes    3) normocytic chronic anemia of malignancy and chemoRx--patient with neoplastic stable anemia -Hemoglobin greater than 9 which is close to patient's baseline   4)H/o CAD-no ACS type symptoms, continue Crestor and aspirin Appears improved, okay to change metoprolol to 25 mg daily   5) hypokalemia--- resolved with replacement  Disposition--- Home    Disposition: The patient is from: Home              Anticipated d/c is to: Home             Code Status :  -  Code Status: Full Code    Family Communication:     (patient is alert, awake and coherent)  Discussed with patient's girlfriend at bedside at patient's request   Consults  :  EDP Discussed with Oncologist   Discharge Condition: Stable  Follow UP--- oncology  Diet and Activity recommendation:  As advised  Discharge Instructions    Discharge Instructions     Call MD for:  difficulty breathing, headache or visual disturbances   Complete by: As directed    Call MD for:  persistant dizziness or light-headedness   Complete by: As directed    Call MD for:  persistant nausea and vomiting   Complete by: As directed    Call MD for:  temperature >100.4   Complete by: As directed    Diet - low sodium heart healthy   Complete by: As directed    Discharge instructions   Complete by: As directed    1) please take Omnicef/cefdinir antibiotic twice daily for 4 days starting on Sunday 09/08/2020 2) please follow-up with your oncologist within a week for repeat CBC blood test 3) please call or return if persistent fevers or chills or night sweats   Increase activity slowly   Complete by: As directed         Discharge Medications     Allergies as of 09/07/2020   No Known Allergies      Medication List     STOP taking these medications    aspirin 81 MG tablet Replaced by:  aspirin EC 81 MG tablet   prochlorperazine 10 MG tablet Commonly known as: COMPAZINE       TAKE these medications    acyclovir 400 MG tablet Commonly known as: ZOVIRAX Take 1 tablet (400 mg total) by mouth 2 (two) times daily.   aspirin EC 81 MG tablet Take 1 tablet (81 mg total) by mouth daily with breakfast. Replaces: aspirin 81 MG tablet   BORTEZOMIB IJ Inject as directed once a week.   calcium-vitamin D 500-200 MG-UNIT tablet Commonly known as: OSCAL  WITH D Take 1 tablet by mouth.   cefdinir 300 MG capsule Commonly known as: OMNICEF Take 1 capsule (300 mg total) by mouth 2 (two) times daily for 4 days.   DARATUMUMAB-HYALURONIDASE-FIHJ [place] Inject into the skin once a week.   dexamethasone 4 MG tablet Commonly known as: DECADRON Take 10 tablets (40 mg total) by mouth once a week.   lenalidomide 10 MG capsule Commonly known as: REVLIMID Take 10 mg by mouth daily. Daily, 14 days on 7 days off every 21 days for cycle 1 of tx; after cycle 1 will increase to 25 mg daily 14 days on 1 week off for subsequent cycles   metoprolol succinate 25 MG 24 hr tablet Commonly known as: TOPROL-XL TAKE 1 TABLET BY MOUTH DAILY. What changed: how much to take   multivitamin tablet Take 1 tablet by mouth daily.   nitroGLYCERIN 0.4 MG SL tablet Commonly known as: NITROSTAT Place 1 tablet (0.4 mg total) under the tongue every 5 (five) minutes for 3 doses as needed for chest pain.   rosuvastatin 40 MG tablet Commonly known as: CRESTOR Take 1 tablet (40 mg total) by mouth daily.   traMADol 50 MG tablet Commonly known as: ULTRAM Take 1 tablet (50 mg total) by mouth every 6 (six) hours as needed.        Major procedures and Radiology Reports - PLEASE review detailed and final reports for all details, in brief -    DG Chest 2 View  Result Date: 09/06/2020 CLINICAL DATA:  Dyspnea and respiratory abnormalities.  Fever. EXAM: CHEST - 2 VIEW COMPARISON:  09/04/2020 and 06/24/2018  FINDINGS: Coarse lung markings have minimally changed from the prior examinations. Heart size is within normal limits and stable. No large pleural effusions. No acute bone abnormality. IMPRESSION: Chronic lung changes without acute findings. Electronically Signed   By: Markus Daft M.D.   On: 09/06/2020 08:39   DG Chest 2 View  Result Date: 09/04/2020 CLINICAL DATA:  Fever, suspected sepsis EXAM: CHEST - 2 VIEW COMPARISON:  06/24/2018 FINDINGS: Frontal and lateral views of the chest demonstrate unremarkable cardiac silhouette. Lung volumes are diminished, patchy bibasilar consolidation greatest in the right infrahilar region. Findings could reflect hypoventilatory change or developing airspace disease. No effusion or pneumothorax. No acute bony abnormalities. IMPRESSION: 1. Low lung volumes, with patchy bibasilar airspace disease. Findings could reflect pneumonia given clinical presentation. Electronically Signed   By: Randa Ngo M.D.   On: 09/04/2020 21:09    Micro Results   Recent Results (from the past 240 hour(s))  Resp Panel by RT-PCR (Flu A&B, Covid) Nasopharyngeal Swab     Status: None   Collection Time: 09/04/20  8:06 PM   Specimen: Nasopharyngeal Swab; Nasopharyngeal(NP) swabs in vial transport medium  Result Value Ref Range Status   SARS Coronavirus 2 by RT PCR NEGATIVE NEGATIVE Final    Comment: (NOTE) SARS-CoV-2 target nucleic acids are NOT DETECTED.  The SARS-CoV-2 RNA is generally detectable in upper respiratory specimens during the acute phase of infection. The lowest concentration of SARS-CoV-2 viral copies this assay can detect is 138 copies/mL. A negative result does not preclude SARS-Cov-2 infection and should not be used as the sole basis for treatment or other patient management decisions. A negative result may occur with  improper specimen collection/handling, submission of specimen other than nasopharyngeal swab, presence of viral mutation(s) within the areas  targeted by this assay, and inadequate number of viral copies(<138 copies/mL). A negative result must be combined with  clinical observations, patient history, and epidemiological information. The expected result is Negative.  Fact Sheet for Patients:  EntrepreneurPulse.com.au  Fact Sheet for Healthcare Providers:  IncredibleEmployment.be  This test is no t yet approved or cleared by the [place] FDA and  has been authorized for detection and/or diagnosis of SARS-CoV-2 by FDA under an Emergency Use Authorization (EUA). This EUA will remain  in effect (meaning this test can be used) for the duration of the COVID-19 declaration under Section 564(b)(1) of the Act, 21 U.S.C.section 360bbb-3(b)(1), unless the authorization is terminated  or revoked sooner.       Influenza A by PCR NEGATIVE NEGATIVE Final   Influenza B by PCR NEGATIVE NEGATIVE Final    Comment: (NOTE) The Xpert Xpress SARS-CoV-2/FLU/RSV plus assay is intended as an aid in the diagnosis of influenza from Nasopharyngeal swab specimens and should not be used as a sole basis for treatment. Nasal washings and aspirates are unacceptable for Xpert Xpress SARS-CoV-2/FLU/RSV testing.  Fact Sheet for Patients: EntrepreneurPulse.com.au  Fact Sheet for Healthcare Providers: IncredibleEmployment.be  This test is not yet approved or cleared by the [place] FDA and has been authorized for detection and/or diagnosis of SARS-CoV-2 by FDA under an Emergency Use Authorization (EUA). This EUA will remain in effect (meaning this test can be used) for the duration of the COVID-19 declaration under Section 564(b)(1) of the Act, 21 U.S.C. section 360bbb-3(b)(1), unless the authorization is terminated or revoked.  Performed at [facility], [address] [place], [place] [address]   Culture, blood (Routine x 2)     Status: None (Preliminary result)    Collection Time: 09/04/20  8:11 PM   Specimen: Right Antecubital; Blood  Result Value Ref Range Status   Specimen Description RIGHT ANTECUBITAL  Final   Special Requests   Final    BOTTLES DRAWN AEROBIC AND ANAEROBIC Blood Culture results may not be optimal due to an excessive volume of blood received in culture bottles   Culture   Final    NO GROWTH 3 DAYS Performed at [facility], [address] [place], [place] [address]    Report Status PENDING  Incomplete  Culture, blood (Routine x 2)     Status: None (Preliminary result)   Collection Time: 09/04/20  8:11 PM   Specimen: BLOOD LEFT HAND  Result Value Ref Range Status   Specimen Description BLOOD LEFT HAND  Final   Special Requests   Final    BOTTLES DRAWN AEROBIC AND ANAEROBIC Blood Culture adequate volume   Culture   Final    NO GROWTH 3 DAYS Performed at [facility], [address] [place], [place] [address]    Report Status PENDING  Incomplete  MRSA Next Gen by PCR, Nasal     Status: None   Collection Time: 09/06/20  8:55 AM   Specimen: Nasal Mucosa; Nasal Swab  Result Value Ref Range Status   MRSA by PCR Next Gen NOT DETECTED NOT DETECTED Final    Comment: (NOTE) The GeneXpert MRSA Assay (FDA approved for NASAL specimens only), is one component of a comprehensive MRSA colonization surveillance program. It is not intended to diagnose MRSA infection nor to guide or monitor treatment for MRSA infections. Test performance is not FDA approved in patients less than 40 years old. Performed at [facility], [address], [place], [place] [address]        Today   Subjective    Pao Haffey today has no new complaints -No further fevers,  no further chills  No Nausea, Vomiting or Diarrhea          Patient has been seen and examined prior to discharge   Objective   Blood pressure 116/64, pulse 76, temperature 98.5 F (36.9 C), temperature source Oral, resp. rate 18, height $RemoveBe'5\' 9"'SFXahjzHP$  (1.753 m), weight 95.7 kg, SpO2  100 %.   Intake/Output Summary (Last 24 hours) at 09/07/2020 1224 Last data filed at 09/07/2020 0900 Gross per 24 hour  Intake 1645.93 ml  Output --  Net 1645.93 ml    Exam Gen:- Awake Alert, no acute distress  HEENT:- [place].AT, No sclera icterus Neck-Supple Neck,No JVD,.  Lungs-  CTAB , good air movement bilaterally  CV- S1, S2 normal, regular Abd-  +ve B.Sounds, Abd Soft, No tenderness,    Extremity/Skin:- No  edema,   good pulses Psych-affect is appropriate, oriented x3 Neuro-no new focal deficits, no tremors    Data Review   CBC w Diff:  Lab Results  Component Value Date   WBC 4.8 09/07/2020   HGB 9.8 (L) 09/07/2020   HCT 30.4 (L) 09/07/2020   PLT 222 09/07/2020   LYMPHOPCT 14 09/04/2020   MONOPCT 3 09/04/2020   EOSPCT 1 09/04/2020   BASOPCT 0 09/04/2020    CMP:  Lab Results  Component Value Date   NA 136 09/06/2020   K 3.8 09/06/2020   CL 106 09/06/2020   CO2 24 09/06/2020   BUN 19 09/06/2020   CREATININE 1.01 09/06/2020   CREATININE 1.09 06/02/2019   PROT 7.4 09/05/2020   ALBUMIN 2.8 (L) 09/05/2020   BILITOT 0.6 09/05/2020   ALKPHOS 58 09/05/2020   AST 25 09/05/2020   ALT 22 09/05/2020  .   Total Discharge time is about 33 minutes  Roxan Hockey M.D on 09/07/2020 at 12:24 PM  Go to [email or web address] -  for contact info  [facility] - Office  [phone]

## 2020-09-07 NOTE — Progress Notes (Signed)
Nsg Discharge Note  Admit Date:  09/04/2020 Discharge date: 09/07/2020   Eugene [name] to be D/C'd Home per MD order.  AVS completed.  Copy for chart, and copy for patient signed, and dated. Patient/caregiver able to verbalize understanding. IV removed. Discharge paper work given and reviewed with patient. Patient stable upon discharge.   Discharge Medication: Allergies as of 09/07/2020   No Known Allergies      Medication List     STOP taking these medications    aspirin 81 MG tablet Replaced by: aspirin EC 81 MG tablet   prochlorperazine 10 MG tablet Commonly known as: COMPAZINE       TAKE these medications    acyclovir 400 MG tablet Commonly known as: ZOVIRAX Take 1 tablet (400 mg total) by mouth 2 (two) times daily.   aspirin EC 81 MG tablet Take 1 tablet (81 mg total) by mouth daily with breakfast. Replaces: aspirin 81 MG tablet   BORTEZOMIB IJ Inject as directed once a week.   calcium-vitamin D 500-200 MG-UNIT tablet Commonly known as: OSCAL WITH D Take 1 tablet by mouth.   cefdinir 300 MG capsule Commonly known as: OMNICEF Take 1 capsule (300 mg total) by mouth 2 (two) times daily for 4 days.   DARATUMUMAB-HYALURONIDASE-FIHJ [place] Inject into the skin once a week.   dexamethasone 4 MG tablet Commonly known as: DECADRON Take 10 tablets (40 mg total) by mouth once a week.   lenalidomide 10 MG capsule Commonly known as: REVLIMID Take 10 mg by mouth daily. Daily, 14 days on 7 days off every 21 days for cycle 1 of tx; after cycle 1 will increase to 25 mg daily 14 days on 1 week off for subsequent cycles   metoprolol succinate 25 MG 24 hr tablet Commonly known as: TOPROL-XL TAKE 1 TABLET BY MOUTH DAILY. What changed: how much to take   multivitamin tablet Take 1 tablet by mouth daily.   nitroGLYCERIN 0.4 MG SL tablet Commonly known as: NITROSTAT Place 1 tablet (0.4 mg total) under the tongue every 5 (five) minutes for 3 doses as needed for chest  pain.   rosuvastatin 40 MG tablet Commonly known as: CRESTOR Take 1 tablet (40 mg total) by mouth daily.   traMADol 50 MG tablet Commonly known as: ULTRAM Take 1 tablet (50 mg total) by mouth every 6 (six) hours as needed.        Discharge Assessment: Vitals:   09/07/20 0044 09/07/20 0633  BP:  116/64  Pulse: 62 76  Resp:    Temp:  98.5 F (36.9 C)  SpO2: 100% 100%   Skin clean, dry and intact without evidence of skin break down, no evidence of skin tears noted. IV catheter discontinued intact. Site without signs and symptoms of complications - no redness or edema noted at insertion site, patient denies c/o pain - only slight tenderness at site.  Dressing with slight pressure applied.  D/c Instructions-Education: Discharge instructions given to patient/family with verbalized understanding. D/c education completed with patient/family including follow up instructions, medication list, d/c activities limitations if indicated, with other d/c instructions as indicated by MD - patient able to verbalize understanding, all questions fully answered. Patient instructed to return to ED, call 911, or call MD for any changes in condition.  Patient escorted via [place], and D/C home via private auto.  Zenaida Deed, RN 09/07/2020 1:47 PM

## 2020-09-09 ENCOUNTER — Other Ambulatory Visit: Payer: Self-pay | Admitting: *Deleted

## 2020-09-09 ENCOUNTER — Encounter: Payer: Self-pay | Admitting: *Deleted

## 2020-09-09 ENCOUNTER — Telehealth: Payer: Self-pay | Admitting: *Deleted

## 2020-09-09 LAB — CULTURE, BLOOD (ROUTINE X 2)
Culture: NO GROWTH
Culture: NO GROWTH
Special Requests: ADEQUATE

## 2020-09-09 NOTE — Telephone Encounter (Signed)
Transition Care Management Follow-up Telephone Call Date of discharge and from where: 09-08-20, [facility]  How have you been since you were released from the hospital? Has been doing ok  Any questions or concerns? No  Items Reviewed: Did the pt receive and understand the discharge instructions provided? Yes  Medications obtained and verified? Yes  Other? No  Any new allergies since your discharge? No  Dietary orders reviewed? Yes Do you have support at home? Yes   Home Care and Equipment/Supplies: Were home health services ordered? no If so, what is the name of the agency? NA  Has the agency set up a time to come to the patient's home? not applicable Were any new equipment or medical supplies ordered?  No What is the name of the medical supply agency? NA Were you able to get the supplies/equipment? not applicable Do you have any questions related to the use of the equipment or supplies? No  Functional Questionnaire: (I = Independent and D = Dependent) ADLs: I  Bathing/Dressing- I  Meal Prep- I  Eating- I  Maintaining continence- I  Transferring/Ambulation- I  Managing Meds- I  Follow up appointments reviewed:  PCP Hospital f/u appt confirmed? Yes  Scheduled to see Posey Pronto on 09-18-20 @ 10:40. [facility] f/u appt confirmed? Yes   Are transportation arrangements needed? No  If their condition worsens, is the pt aware to call PCP or go to the Emergency Dept.? Yes Was the patient provided with contact information for the PCP's office or ED? Yes Was to pt encouraged to call back with questions or concerns? Yes

## 2020-09-09 NOTE — Patient Outreach (Signed)
[facility]) Care Management  09/09/2020  Eugene [name] [birth year] [id number]   Transition of care call/case closure   Referral received:09/09/20 Notification of discharge after readmission . Initial outreach:09/09/20 Insurance: [employer]    Subjective: Following patient from prior admission and  this current hospital readmission.  Explained purpose of call and completed transition of care assessment.  Eugene [name] states that he is feeling much better. He denies having fever, cough. He reports taking antibiotics as prescribed.  He reports , tolerating diet, denies bowel or bladder problems.  Patient son and daughter assisting with his  recovery.He report continues to tolerate mobility in home improvement in pain and states getting stronger.   He discussed beginning chemotherapy at [facility] and plans for radiation therapy at [facility] will be scheduled at a later time .  Reviewed accessing the following [place] Benefits  He does not have the hospital indemnity plan ,  he is accessing FMLA benefit . He uses a [facility] outpatient pharmacy at [facility] outpatient pharmacy.     Objective:  Eugene [name] was hospitalized at [facility] 8/ 24- 8/28 for Acute febrile illness, community acquired pneumonia Comorbidities include: Multiple myeloma, hypertension,CAD,  recent ORIF trocanteric femoral IM nail, chemotherapy.  He was discharged to home on 09/08/20 without the need for home health services or DME.   Assessment:  Patient voices good understanding of all discharge instructions.  See transition of care flowsheet for assessment details.   Plan:  Reviewed hospital discharge diagnosis of Febrile Illness, community acquired pneumonia   and discharge treatment plan using hospital discharge instructions, assessing medication adherence, reviewing problems requiring provider notification, and discussing the importance of follow up with surgeon,  primary care provider and/or specialists as directed.  Reviewed [place] healthy lifestyle program information to receive discounted premium for  2023   Step 1: Get  your annual physical  Step 2: Complete your health assessment  Step 3:Identify your current health status and complete the corresponding action step between January 13, 2020 and September 12, 2020.    Using [place] website, verified that patient is an active participate in [place]'s Active Health Management chronic disease management program.    Patient agreeable to follow up call in the next week for assessment care coordination needs prior to case closure .  Joylene Draft, RN, BSN  [place] Management Coordinator  [phone]- Mobile [phone]- Toll Free Main Office

## 2020-09-10 ENCOUNTER — Other Ambulatory Visit (HOSPITAL_COMMUNITY): Payer: Self-pay

## 2020-09-10 ENCOUNTER — Encounter (HOSPITAL_COMMUNITY): Payer: Self-pay

## 2020-09-10 ENCOUNTER — Inpatient Hospital Stay (HOSPITAL_COMMUNITY): Payer: 59

## 2020-09-10 ENCOUNTER — Other Ambulatory Visit: Payer: Self-pay

## 2020-09-10 VITALS — BP 118/53 | HR 68 | Temp 97.0°F | Resp 18 | Wt 216.2 lb

## 2020-09-10 DIAGNOSIS — Z5112 Encounter for antineoplastic immunotherapy: Secondary | ICD-10-CM | POA: Insufficient documentation

## 2020-09-10 DIAGNOSIS — C9000 Multiple myeloma not having achieved remission: Secondary | ICD-10-CM

## 2020-09-10 DIAGNOSIS — I10 Essential (primary) hypertension: Secondary | ICD-10-CM | POA: Insufficient documentation

## 2020-09-10 DIAGNOSIS — M25551 Pain in right hip: Secondary | ICD-10-CM | POA: Insufficient documentation

## 2020-09-10 DIAGNOSIS — Z7982 Long term (current) use of aspirin: Secondary | ICD-10-CM | POA: Insufficient documentation

## 2020-09-10 DIAGNOSIS — M79651 Pain in right thigh: Secondary | ICD-10-CM | POA: Insufficient documentation

## 2020-09-10 LAB — CBC WITH DIFFERENTIAL/PLATELET
Abs Immature Granulocytes: 0.01 10*3/uL (ref 0.00–0.07)
Basophils Absolute: 0 10*3/uL (ref 0.0–0.1)
Basophils Relative: 0 %
Eosinophils Absolute: 0.2 10*3/uL (ref 0.0–0.5)
Eosinophils Relative: 5 %
HCT: 33.7 % — ABNORMAL LOW (ref 39.0–52.0)
Hemoglobin: 10.8 g/dL — ABNORMAL LOW (ref 13.0–17.0)
Immature Granulocytes: 0 %
Lymphocytes Relative: 33 %
Lymphs Abs: 1.5 10*3/uL (ref 0.7–4.0)
MCH: 32.3 pg (ref 26.0–34.0)
MCHC: 32 g/dL (ref 30.0–36.0)
MCV: 100.9 fL — ABNORMAL HIGH (ref 80.0–100.0)
Monocytes Absolute: 0.3 10*3/uL (ref 0.1–1.0)
Monocytes Relative: 6 %
Neutro Abs: 2.5 10*3/uL (ref 1.7–7.7)
Neutrophils Relative %: 56 %
Platelets: 291 10*3/uL (ref 150–400)
RBC: 3.34 MIL/uL — ABNORMAL LOW (ref 4.22–5.81)
RDW: 13.7 % (ref 11.5–15.5)
WBC: 4.5 10*3/uL (ref 4.0–10.5)
nRBC: 0 % (ref 0.0–0.2)

## 2020-09-10 LAB — COMPREHENSIVE METABOLIC PANEL
ALT: 40 U/L (ref 0–44)
AST: 30 U/L (ref 15–41)
Albumin: 3.7 g/dL (ref 3.5–5.0)
Alkaline Phosphatase: 88 U/L (ref 38–126)
Anion gap: 7 (ref 5–15)
BUN: 18 mg/dL (ref 8–23)
CO2: 25 mmol/L (ref 22–32)
Calcium: 8.7 mg/dL — ABNORMAL LOW (ref 8.9–10.3)
Chloride: 101 mmol/L (ref 98–111)
Creatinine, Ser: 0.99 mg/dL (ref 0.61–1.24)
GFR, Estimated: >60 mL/min (ref >60)
Glucose, Bld: 151 mg/dL — ABNORMAL HIGH (ref 70–99)
Potassium: 3.5 mmol/L (ref 3.5–5.1)
Sodium: 133 mmol/L — ABNORMAL LOW (ref 135–145)
Total Bilirubin: 0.6 mg/dL (ref 0.3–1.2)
Total Protein: 8.7 g/dL — ABNORMAL HIGH (ref 6.5–8.1)

## 2020-09-10 LAB — MAGNESIUM: Magnesium: 1.9 mg/dL (ref 1.7–2.4)

## 2020-09-10 MED ORDER — DARATUMUMAB-HYALURONIDASE-FIHJ 1800-30000 MG-UT/15ML ~~LOC~~ SOLN
1800.0000 mg | Freq: Once | SUBCUTANEOUS | Status: AC
  Administered 2020-09-10: 1800 mg via SUBCUTANEOUS
  Filled 2020-09-10: qty 15

## 2020-09-10 MED ORDER — ACETAMINOPHEN 325 MG PO TABS
650.0000 mg | ORAL_TABLET | Freq: Once | ORAL | Status: AC
  Administered 2020-09-10: 650 mg via ORAL
  Filled 2020-09-10: qty 2

## 2020-09-10 MED ORDER — FAMOTIDINE 20 MG PO TABS
20.0000 mg | ORAL_TABLET | Freq: Once | ORAL | Status: AC
  Administered 2020-09-10: 20 mg via ORAL
  Filled 2020-09-10: qty 1

## 2020-09-10 MED ORDER — MONTELUKAST SODIUM 10 MG PO TABS
10.0000 mg | ORAL_TABLET | Freq: Once | ORAL | Status: AC
  Administered 2020-09-10: 10 mg via ORAL
  Filled 2020-09-10: qty 1

## 2020-09-10 MED ORDER — BORTEZOMIB CHEMO SQ INJECTION 3.5 MG (2.5MG/ML)
1.3000 mg/m2 | Freq: Once | INTRAMUSCULAR | Status: AC
  Administered 2020-09-10: 2.75 mg via SUBCUTANEOUS
  Filled 2020-09-10: qty 1.1

## 2020-09-10 MED ORDER — DIPHENHYDRAMINE HCL 25 MG PO CAPS
25.0000 mg | ORAL_CAPSULE | Freq: Once | ORAL | Status: AC
  Administered 2020-09-10: 25 mg via ORAL
  Filled 2020-09-10: qty 1

## 2020-09-10 NOTE — Patient Instructions (Signed)
[facility]  Discharge Instructions: Thank you for choosing [facility] to provide your oncology and hematology care.  If you have a lab appointment with the Cancer Center, please come in thru the Main Entrance and check in at the main information desk.  Wear comfortable clothing and clothing appropriate for easy access to any Portacath or PICC line.   We strive to give you quality time with your provider. You may need to reschedule your appointment if you arrive late (15 or more minutes).  Arriving late affects you and other patients whose appointments are after yours.  Also, if you miss three or more appointments without notifying the office, you may be dismissed from the clinic at the provider's discretion.      For prescription refill requests, have your pharmacy contact our office and allow 72 hours for refills to be completed.        To help prevent nausea and vomiting after your treatment, we encourage you to take your nausea medication as directed.  BELOW ARE SYMPTOMS THAT SHOULD BE REPORTED IMMEDIATELY: *FEVER GREATER THAN 100.4 F (38 C) OR HIGHER *CHILLS OR SWEATING *NAUSEA AND VOMITING THAT IS NOT CONTROLLED WITH YOUR NAUSEA MEDICATION *UNUSUAL SHORTNESS OF BREATH *UNUSUAL BRUISING OR BLEEDING *URINARY PROBLEMS (pain or burning when urinating, or frequent urination) *BOWEL PROBLEMS (unusual diarrhea, constipation, pain near the anus) TENDERNESS IN MOUTH AND THROAT WITH OR WITHOUT PRESENCE OF ULCERS (sore throat, sores in mouth, or a toothache) UNUSUAL RASH, SWELLING OR PAIN  UNUSUAL VAGINAL DISCHARGE OR ITCHING   Items with * indicate a potential emergency and should be followed up as soon as possible or go to the Emergency Department if any problems should occur.  Please show the CHEMOTHERAPY ALERT CARD or IMMUNOTHERAPY ALERT CARD at check-in to the Emergency Department and triage nurse.  Should you have questions after your visit or need to cancel  or reschedule your appointment, please contact [facility] [phone]  and follow the prompts.  Office hours are 8:00 a.m. to 4:30 p.m. Monday - Friday. Please note that voicemails left after 4:00 p.m. may not be returned until the following business day.  We are closed weekends and major holidays. You have access to a nurse at all times for urgent questions. Please call the main number to the clinic [phone] and follow the prompts.  For any non-urgent questions, you may also contact your provider using MyChart. We now offer e-Visits for anyone 18 and older to request care online for non-urgent symptoms. For details visit [email or web address].   Also download the MyChart app! Go to the app store, search "MyChart", open the app, select [facility], and log in with your MyChart username and password.  Due to Covid, a mask is required upon entering the hospital/clinic. If you do not have a mask, one will be given to you upon arrival. For doctor visits, patients may have 1 support person aged 18 or older with them. For treatment visits, patients cannot have anyone with them due to current Covid guidelines and our immunocompromised population.  

## 2020-09-10 NOTE — Progress Notes (Signed)
Patient present for day 8 today of cycle one. Patient was hospitalized las week after day one for three days for fever. Patient did miss three doses of his Revlimid but started back on Sunday. Verified with MD ok for patient to have radiation on his leg while receiving chemo. Labs reviewed and ok to proceed per MD.    Treatment given per orders. Observed pt for 2 hours post injection per protocol.  Patient tolerated it well without problems. Vitals stable and discharged home from clinic ambulatory. Follow up as scheduled.

## 2020-09-11 ENCOUNTER — Ambulatory Visit: Payer: 59

## 2020-09-11 LAB — TYPE AND SCREEN
ABO/RH(D): O POS
Antibody Screen: POSITIVE
DAT, IgG: NEGATIVE
DAT, complement: NEGATIVE

## 2020-09-17 ENCOUNTER — Other Ambulatory Visit (HOSPITAL_COMMUNITY): Payer: Self-pay | Admitting: Hematology and Oncology

## 2020-09-17 ENCOUNTER — Inpatient Hospital Stay (HOSPITAL_COMMUNITY): Payer: 59 | Attending: Hematology

## 2020-09-17 ENCOUNTER — Other Ambulatory Visit: Payer: Self-pay | Admitting: Hematology and Oncology

## 2020-09-17 ENCOUNTER — Inpatient Hospital Stay (HOSPITAL_COMMUNITY): Payer: 59

## 2020-09-17 ENCOUNTER — Other Ambulatory Visit (HOSPITAL_COMMUNITY): Payer: Self-pay

## 2020-09-17 ENCOUNTER — Other Ambulatory Visit: Payer: Self-pay

## 2020-09-17 VITALS — BP 116/61 | HR 67 | Temp 96.7°F | Resp 18 | Wt 216.6 lb

## 2020-09-17 DIAGNOSIS — C9000 Multiple myeloma not having achieved remission: Secondary | ICD-10-CM | POA: Insufficient documentation

## 2020-09-17 DIAGNOSIS — Z5112 Encounter for antineoplastic immunotherapy: Secondary | ICD-10-CM | POA: Insufficient documentation

## 2020-09-17 LAB — COMPREHENSIVE METABOLIC PANEL
ALT: 20 U/L (ref 0–44)
AST: 21 U/L (ref 15–41)
Albumin: 3.6 g/dL (ref 3.5–5.0)
Alkaline Phosphatase: 82 U/L (ref 38–126)
Anion gap: 9 (ref 5–15)
BUN: 18 mg/dL (ref 8–23)
CO2: 25 mmol/L (ref 22–32)
Calcium: 8.5 mg/dL — ABNORMAL LOW (ref 8.9–10.3)
Chloride: 99 mmol/L (ref 98–111)
Creatinine, Ser: 1 mg/dL (ref 0.61–1.24)
GFR, Estimated: >60 mL/min (ref >60)
Glucose, Bld: 144 mg/dL — ABNORMAL HIGH (ref 70–99)
Potassium: 3.5 mmol/L (ref 3.5–5.1)
Sodium: 133 mmol/L — ABNORMAL LOW (ref 135–145)
Total Bilirubin: 0.7 mg/dL (ref 0.3–1.2)
Total Protein: 7.9 g/dL (ref 6.5–8.1)

## 2020-09-17 LAB — CBC WITH DIFFERENTIAL/PLATELET
Abs Immature Granulocytes: 0.01 10*3/uL (ref 0.00–0.07)
Basophils Absolute: 0 10*3/uL (ref 0.0–0.1)
Basophils Relative: 0 %
Eosinophils Absolute: 0.1 10*3/uL (ref 0.0–0.5)
Eosinophils Relative: 2 %
HCT: 34.9 % — ABNORMAL LOW (ref 39.0–52.0)
Hemoglobin: 11.3 g/dL — ABNORMAL LOW (ref 13.0–17.0)
Immature Granulocytes: 0 %
Lymphocytes Relative: 54 %
Lymphs Abs: 1.7 10*3/uL (ref 0.7–4.0)
MCH: 32.4 pg (ref 26.0–34.0)
MCHC: 32.4 g/dL (ref 30.0–36.0)
MCV: 100 fL (ref 80.0–100.0)
Monocytes Absolute: 0.2 10*3/uL (ref 0.1–1.0)
Monocytes Relative: 7 %
Neutro Abs: 1.2 10*3/uL — ABNORMAL LOW (ref 1.7–7.7)
Neutrophils Relative %: 37 %
Platelets: 291 10*3/uL (ref 150–400)
RBC: 3.49 MIL/uL — ABNORMAL LOW (ref 4.22–5.81)
RDW: 13.8 % (ref 11.5–15.5)
WBC: 3.2 10*3/uL — ABNORMAL LOW (ref 4.0–10.5)
nRBC: 0 % (ref 0.0–0.2)

## 2020-09-17 LAB — SAMPLE TO BLOOD BANK

## 2020-09-17 LAB — LACTATE DEHYDROGENASE: LDH: 96 U/L — ABNORMAL LOW (ref 98–192)

## 2020-09-17 LAB — MAGNESIUM: Magnesium: 1.9 mg/dL (ref 1.7–2.4)

## 2020-09-17 MED ORDER — DARATUMUMAB-HYALURONIDASE-FIHJ 1800-30000 MG-UT/15ML ~~LOC~~ SOLN
1800.0000 mg | Freq: Once | SUBCUTANEOUS | Status: AC
  Administered 2020-09-17: 1800 mg via SUBCUTANEOUS
  Filled 2020-09-17: qty 15

## 2020-09-17 MED ORDER — MONTELUKAST SODIUM 10 MG PO TABS
10.0000 mg | ORAL_TABLET | Freq: Once | ORAL | Status: AC
  Administered 2020-09-17: 10 mg via ORAL
  Filled 2020-09-17: qty 1

## 2020-09-17 MED ORDER — DIPHENHYDRAMINE HCL 25 MG PO CAPS
25.0000 mg | ORAL_CAPSULE | Freq: Once | ORAL | Status: AC
  Administered 2020-09-17: 25 mg via ORAL
  Filled 2020-09-17: qty 1

## 2020-09-17 MED ORDER — FAMOTIDINE 20 MG PO TABS
20.0000 mg | ORAL_TABLET | Freq: Once | ORAL | Status: AC
  Administered 2020-09-17: 20 mg via ORAL
  Filled 2020-09-17: qty 1

## 2020-09-17 MED ORDER — LENALIDOMIDE 25 MG PO CAPS: 25.0000 mg | ORAL_CAPSULE | Freq: Every day | ORAL | 0 refills | Status: DC

## 2020-09-17 MED ORDER — DEXAMETHASONE 4 MG PO TABS
12.0000 mg | ORAL_TABLET | Freq: Once | ORAL | Status: AC
  Administered 2020-09-17: 12 mg via ORAL
  Filled 2020-09-17: qty 3

## 2020-09-17 MED ORDER — ACETAMINOPHEN 325 MG PO TABS
650.0000 mg | ORAL_TABLET | Freq: Once | ORAL | Status: AC
  Administered 2020-09-17: 650 mg via ORAL
  Filled 2020-09-17: qty 2

## 2020-09-17 NOTE — Patient Instructions (Signed)
[facility]  Discharge Instructions: Thank you for choosing [facility] to provide your oncology and hematology care.  If you have a lab appointment with the [facility], please come in thru the Main Entrance and check in at the main information desk.  Wear comfortable clothing and clothing appropriate for easy access to any Portacath or PICC line.   We strive to give you quality time with your provider. You may need to reschedule your appointment if you arrive late (15 or more minutes).  Arriving late affects you and other patients whose appointments are after yours.  Also, if you miss three or more appointments without notifying the office, you may be dismissed from the clinic at the provider's discretion.      For prescription refill requests, have your pharmacy contact our office and allow 72 hours for refills to be completed.    Today you received the following chemotherapy and/or immunotherapy agents Darzalex Faspro [place].      To help prevent nausea and vomiting after your treatment, we encourage you to take your nausea medication as directed.  BELOW ARE SYMPTOMS THAT SHOULD BE REPORTED IMMEDIATELY: *FEVER GREATER THAN 100.4 F (38 C) OR HIGHER *CHILLS OR SWEATING *NAUSEA AND VOMITING THAT IS NOT CONTROLLED WITH YOUR NAUSEA MEDICATION *UNUSUAL SHORTNESS OF BREATH *UNUSUAL BRUISING OR BLEEDING *URINARY PROBLEMS (pain or burning when urinating, or frequent urination) *BOWEL PROBLEMS (unusual diarrhea, constipation, pain near the anus) TENDERNESS IN MOUTH AND THROAT WITH OR WITHOUT PRESENCE OF ULCERS (sore throat, sores in mouth, or a toothache) UNUSUAL RASH, SWELLING OR PAIN  UNUSUAL VAGINAL DISCHARGE OR ITCHING   Items with * indicate a potential emergency and should be followed up as soon as possible or go to the Emergency Department if any problems should occur.  Please show the CHEMOTHERAPY ALERT CARD or IMMUNOTHERAPY ALERT CARD at check-in to the  Emergency Department and triage nurse.  Should you have questions after your visit or need to cancel or reschedule your appointment, please contact [facility] [phone]  and follow the prompts.  Office hours are 8:00 a.m. to 4:30 p.m. Monday - Friday. Please note that voicemails left after 4:00 p.m. may not be returned until the following business day.  We are closed weekends and major holidays. You have access to a nurse at all times for urgent questions. Please call the main number to the clinic [phone] and follow the prompts.  For any non-urgent questions, you may also contact your provider using MyChart. We now offer e-Visits for anyone 21 and older to request care online for non-urgent symptoms. For details visit [email or web address].   Also download the MyChart app! Go to the app store, search "MyChart", open the app, select [facility], and log in with your MyChart username and password.  Due to Covid, a mask is required upon entering the hospital/clinic. If you do not have a mask, one will be given to you upon arrival. For doctor visits, patients may have 1 support person aged 25 or older with them. For treatment visits, patients cannot have anyone with them due to current Covid guidelines and our immunocompromised population.

## 2020-09-17 NOTE — Telephone Encounter (Signed)
New Revlimid prescription sent in for '25mg'$  14 days on, 7 days off per Dr. Delton Coombes

## 2020-09-17 NOTE — Progress Notes (Signed)
Patient presents today for Darzalex Faspro [place]. WBC 3.2, ANC 1.2 today. Message sent to Dr. Delton Coombes / Janan Ridge RN to report 1.2 ANC. Patient taking Revlimid as prescribed 14 days on with 7 days off every 21 days. Patient denies any side effects of the Revlimid.  Vital signs within parameters for treatment.   Message received from Dr. Delton Coombes to proceed with today's treatment. Patient unable to take 40 mgs of Decadron prior to arrival due to the patient states he only had 7 pills left. Pharmacy to schedule 3 more 4 mg Decadron tables today PO. We will monitor patient today for one hour per Cjones RPH due to after 1st Cycle the patient had a reaction and presented to the ED. Cycle 2 we monitored the patient for 2 hours. Today is Cycle 3 and we will monitor for 1 hour.    Treatment given today per MD orders. Tolerated without adverse affects. Vital signs stable. No complaints at this time. Discharged from clinic ambulatory in stable condition. Alert and oriented x 3. F/U with [facility] as scheduled.

## 2020-09-18 ENCOUNTER — Encounter: Payer: Self-pay | Admitting: Internal Medicine

## 2020-09-18 ENCOUNTER — Ambulatory Visit: Payer: 59 | Admitting: Internal Medicine

## 2020-09-18 VITALS — BP 144/65 | HR 87 | Temp 98.1°F | Resp 18 | Ht 69.0 in | Wt 212.1 lb

## 2020-09-18 DIAGNOSIS — R509 Fever, unspecified: Secondary | ICD-10-CM

## 2020-09-18 DIAGNOSIS — Z09 Encounter for follow-up examination after completed treatment for conditions other than malignant neoplasm: Secondary | ICD-10-CM | POA: Diagnosis not present

## 2020-09-18 DIAGNOSIS — I10 Essential (primary) hypertension: Secondary | ICD-10-CM

## 2020-09-18 DIAGNOSIS — I2510 Atherosclerotic heart disease of native coronary artery without angina pectoris: Secondary | ICD-10-CM | POA: Diagnosis not present

## 2020-09-18 LAB — KAPPA/LAMBDA LIGHT CHAINS
Kappa free light chain: 23 mg/L — ABNORMAL HIGH (ref 3.3–19.4)
Kappa, lambda light chain ratio: 2.95 — ABNORMAL HIGH (ref 0.26–1.65)
Lambda free light chains: 7.8 mg/L (ref 5.7–26.3)

## 2020-09-18 NOTE — Assessment & Plan Note (Signed)
Has had cardiac cath in the past, no stents On Aspirin, statin and B-blocker F/u with Cardiology

## 2020-09-18 NOTE — Assessment & Plan Note (Signed)
BP Readings from Last 1 Encounters:  09/18/20 (!) 144/65   Elevated today, overall well-controlled Counseled for compliance with the medications Advised DASH diet and moderate exercise/walking as tolerated

## 2020-09-18 NOTE — Assessment & Plan Note (Signed)
Recent hospitalization for possible pneumonia, was given IV cefepime and Vancomycin followed by Cefdinir Afebrile now He is immunocompromised due to chemotherapy and radiation therapy. Reviewed last CBC and BMP.

## 2020-09-18 NOTE — Patient Instructions (Signed)
Please continue taking medications as prescribed.  Please follow low salt diet and ambulate as tolerated.

## 2020-09-18 NOTE — Progress Notes (Addendum)
Established Patient Office Visit  Subjective:  Patient ID: Eugene [name], male    DOB: 1955/03/29  Age: 65 y.o. MRN: [id number]  CC:  Chief Complaint  Patient presents with   Transitions Of Care    [facility] pt was discharged from [facility] 09-08-20 started chemo    HPI Eugene [name] is a 65 year old male with PMH of CAD, HTN, multiple myeloma and obesity who presents for follow up after recent hospitalization for fever likely 2/2 to CAP.  He was hospitalized from 08/24 to 08/27 for acute febrile illness, which was thought to be due to CAP.  He was given IV cefepime and vancomycin followed by oral cefdinir in the outpatient setting.  He denies any fever, chills, cough, dyspnea or wheezing currently.  He has resumed his chemotherapy for MM.  Last CBC and BMP were reviewed in the system.  His blood pressure was elevated in the office today, but it remains stable overall.Takes medications regularly. Patient denies headache, dizziness, chest pain, dyspnea or palpitations. Has h/o CAD and takes Aspirin, statin and B-blocker.  Past Medical History:  Diagnosis Date   Back pain    CAD (coronary artery disease)    a. 01/2017 NSTEMI/Cath: LM nl, LAD 25p - ? hypodense but no filling defect, RI nl, LCX nl, RCA nl, EF 55-65%-->Med Rx.   Erectile dysfunction    History of echocardiogram    a. 01/2017 Echo: EF 55-60%, no rwma.   Hypertension     Past Surgical History:  Procedure Laterality Date   LEFT HEART CATH AND CORONARY ANGIOGRAPHY N/A 01/15/2017   Procedure: LEFT HEART CATH AND CORONARY ANGIOGRAPHY;  Surgeon: Martinique, Peter M, MD;  Location: [facility];  Service: Cardiovascular;  Laterality: N/A;   ORIF trocanteric femoral IM Nail Right     Family History  Problem Relation Age of Onset   Cancer Mother        breast   Diabetes Mother    Diabetes Sister    Hypertension Sister    Cancer Brother    Mental illness Sister     Social History   Socioeconomic History   Marital  status: Significant Other    Spouse name: Not on file   Number of children: Not on file   Years of education: Not on file   Highest education level: Not on file  Occupational History   Not on file  Tobacco Use   Smoking status: Never   Smokeless tobacco: Never  Vaping Use   Vaping Use: Never used  Substance and Sexual Activity   Alcohol use: No   Drug use: No   Sexual activity: Yes  Other Topics Concern   Not on file  Social History Narrative   Not on file   Social Determinants of Health   Financial Resource Strain: Low Risk    Difficulty of Paying Living Expenses: Not hard at all  Food Insecurity: No Food Insecurity   Worried About Charity fundraiser in the Last Year: Never true   [place] in the Last Year: Never true  Transportation Needs: No Transportation Needs   Lack of Transportation (Medical): No   Lack of Transportation (Non-Medical): No  Physical Activity: Insufficiently Active   Days of Exercise per Week: 2 days   Minutes of Exercise per Session: 10 min  Stress: No Stress Concern Present   Feeling of Stress : Not at all  Social Connections: Moderately Integrated   Frequency of Communication with Friends  and Family: More than three times a week   Frequency of Social Gatherings with Friends and Family: More than three times a week   Attends Religious Services: 1 to 4 times per year   Active Member of [employer] or Organizations: No   Attends Music therapist: 1 to 4 times per year   Marital Status: Divorced  Human resources officer Violence: Not At Risk   Fear of Current or Ex-Partner: No   Emotionally Abused: No   Physically Abused: No   Sexually Abused: No    Outpatient Medications Prior to Visit  Medication Sig Dispense Refill   acyclovir (ZOVIRAX) 400 MG tablet Take 1 tablet (400 mg total) by mouth 2 (two) times daily. 60 tablet 5   aspirin EC 81 MG tablet Take 1 tablet (81 mg total) by mouth daily with breakfast. 30 tablet 5   BORTEZOMIB IJ  Inject as directed once a week.     calcium-vitamin D (OSCAL WITH D) 500-200 MG-UNIT tablet Take 1 tablet by mouth.     DARATUMUMAB-HYALURONIDASE-FIHJ [place] Inject into the skin once a week.     dexamethasone (DECADRON) 4 MG tablet Take 10 tablets (40 mg total) by mouth once a week. 40 tablet 6   lenalidomide (REVLIMID) 25 MG capsule Take 1 capsule (25 mg total) by mouth daily. 14 days on 7 days off every 21 days 14 capsule 0   metoprolol succinate (TOPROL-XL) 25 MG 24 hr tablet TAKE 1 TABLET BY MOUTH DAILY. (Patient taking differently: Take 25 mg by mouth daily.) 90 tablet 3   Multiple Vitamin (MULTIVITAMIN) tablet Take 1 tablet by mouth daily.       nitroGLYCERIN (NITROSTAT) 0.4 MG SL tablet Place 1 tablet (0.4 mg total) under the tongue every 5 (five) minutes for 3 doses as needed for chest pain. 25 tablet 3   rosuvastatin (CRESTOR) 40 MG tablet Take 1 tablet (40 mg total) by mouth daily. 90 tablet 3   traMADol (ULTRAM) 50 MG tablet Take 1 tablet (50 mg total) by mouth every 6 (six) hours as needed. 60 tablet 0   No facility-administered medications prior to visit.    No Known Allergies  ROS Review of Systems  Constitutional:  Negative for chills and fever.  HENT:  Negative for congestion and sore throat.   Eyes:  Negative for pain and discharge.  Respiratory:  Negative for cough and shortness of breath.   Cardiovascular:  Negative for chest pain and palpitations.  Gastrointestinal:  Negative for constipation, diarrhea, nausea and vomiting.  Endocrine: Negative for polydipsia and polyuria.  Genitourinary:  Negative for dysuria and hematuria.  Musculoskeletal:  Positive for arthralgias and myalgias. Negative for neck pain and neck stiffness.  Skin:  Negative for rash.  Neurological:  Negative for dizziness, weakness, numbness and headaches.  Psychiatric/Behavioral:  Negative for agitation and behavioral problems.      Objective:    Physical Exam Vitals reviewed.  Constitutional:       General: He is not in acute distress.    Appearance: He is not diaphoretic.  HENT:     Head: Normocephalic and atraumatic.     Nose: Nose normal.     Mouth/Throat:     Mouth: Mucous membranes are moist.  Eyes:     General: No scleral icterus.    Extraocular Movements: Extraocular movements intact.  Cardiovascular:     Rate and Rhythm: Normal rate and regular rhythm.     Pulses: Normal pulses.     Heart  sounds: Normal heart sounds. No murmur heard. Pulmonary:     Breath sounds: Normal breath sounds. No wheezing or rales.  Musculoskeletal:     Cervical back: Neck supple. No tenderness.     Right lower leg: No edema.     Left lower leg: No edema.  Skin:    General: Skin is warm.     Findings: No rash.  Neurological:     General: No focal deficit present.     Mental Status: He is alert and oriented to person, place, and time.     Sensory: No sensory deficit.     Motor: No weakness.  Psychiatric:        Mood and Affect: Mood normal.        Behavior: Behavior normal.    BP (!) 144/65 (BP Location: Left Arm, Patient Position: Sitting, Cuff Size: Normal)   Pulse 87   Temp 98.1 F (36.7 C) (Oral)   Resp 18   Ht _0  (1.753 m)   Wt 212 lb 1.9 oz (96.2 kg)   SpO2 98%   BMI 31.32 kg/m  Wt Readings from Last 3 Encounters:  09/18/20 212 lb 1.9 oz (96.2 kg)  09/17/20 216 lb 9.6 oz (98.2 kg)  09/10/20 216 lb 3.2 oz (98.1 kg)     Health Maintenance Due  Topic Date Due   COVID-19 Vaccine (1) Never done   Pneumococcal Vaccine 7-36 Years old (1 - PCV) Never done   Zoster Vaccines- Shingrix (1 of 2) Never done   COLONOSCOPY (Pts 45-47yr Insurance coverage will need to be confirmed)  04/07/2017   TETANUS/TDAP  03/26/2019    There are no preventive care reminders to display for this patient.  Lab Results  Component Value Date   TSH 2.140 07/16/2020   Lab Results  Component Value Date   WBC 3.2 (L) 09/17/2020   HGB 11.3 (L) 09/17/2020   HCT 34.9 (L) 09/17/2020    MCV 100.0 09/17/2020   PLT 291 09/17/2020   Lab Results  Component Value Date   NA 133 (L) 09/17/2020   K 3.5 09/17/2020   CO2 25 09/17/2020   GLUCOSE 144 (H) 09/17/2020   BUN 18 09/17/2020   CREATININE 1.00 09/17/2020   BILITOT 0.7 09/17/2020   ALKPHOS 82 09/17/2020   AST 21 09/17/2020   ALT 20 09/17/2020   PROT 7.9 09/17/2020   ALBUMIN 3.6 09/17/2020   CALCIUM 8.5 (L) 09/17/2020   ANIONGAP 9 09/17/2020   Lab Results  Component Value Date   CHOL 148 07/16/2020   Lab Results  Component Value Date   HDL 46 07/16/2020   Lab Results  Component Value Date   LDLCALC 92 07/16/2020   Lab Results  Component Value Date   TRIG 48 07/16/2020   Lab Results  Component Value Date   CHOLHDL 3.2 07/16/2020   Lab Results  Component Value Date   HGBA1C 5.4 07/16/2020      Assessment & Plan:   Problem List Items Addressed This Visit       [facility], including [facility] Medications reconciled and reviewed with the patient      Acute febrile illness   Recent hospitalization for possible pneumonia, was given IV cefepime and Vancomycin followed by Cefdinir Afebrile now He is immunocompromised due to chemotherapy and radiation therapy. Reviewed last CBC and BMP.       Cardiovascular and Mediastinum   CAD (coronary artery disease)  Has had cardiac cath in the past, no stents On Aspirin, statin and B-blocker F/u with Cardiology      Essential hypertension    BP Readings from Last 1 Encounters:  09/18/20 (!) 144/65  Elevated today, overall well-controlled Counseled for compliance with the medications Advised DASH diet and moderate exercise/walking as tolerated        No orders of the defined types were placed in this encounter.   Follow-up: Return in about 6 weeks (around 10/30/2020) for Annual physical.    [name], MD

## 2020-09-18 NOTE — Assessment & Plan Note (Signed)
Hospital chart reviewed, including discharge summary ?Medications reconciled and reviewed with the patient ?

## 2020-09-19 ENCOUNTER — Other Ambulatory Visit: Payer: Self-pay | Admitting: *Deleted

## 2020-09-19 LAB — PROTEIN ELECTROPHORESIS, SERUM
A/G Ratio: 0.9 (ref 0.7–1.7)
Albumin ELP: 3.4 g/dL (ref 2.9–4.4)
Alpha-1-Globulin: 0.3 g/dL (ref 0.0–0.4)
Alpha-2-Globulin: 0.7 g/dL (ref 0.4–1.0)
Beta Globulin: 1 g/dL (ref 0.7–1.3)
Gamma Globulin: 1.9 g/dL — ABNORMAL HIGH (ref 0.4–1.8)
Globulin, Total: 3.9 g/dL (ref 2.2–3.9)
M-Spike, %: 1.6 g/dL — ABNORMAL HIGH
Total Protein ELP: 7.3 g/dL (ref 6.0–8.5)

## 2020-09-19 NOTE — Patient Outreach (Signed)
[facility]) Care Management  09/19/2020  Eugene [name] 26-Jul-1955 [id number]   Transition of care call/case closure    Referral received:09/09/20 Notification of discharge after readmission . Initial outreach:09/09/20 Insurance: [place] [employer]   Subjective Successful outreach call to patient, he reports doing pretty good. He discussed recent visit with PCP and chemo therapy on this week. He states having a little nausea day to chemotherapy that has improved and denies any on symptoms of concerns, no fever, shortness of breath. He discussed continued improvement with mobility after Right ORIF femur.  Patient had questions regarding benefits when he runs out of PAL day, provided contact number for [place] benefits [phone]  Objective:  Eugene [name] was hospitalized at [facility] 8/ 24- 8/28 for Acute febrile illness, community acquired pneumonia Comorbidities include: Multiple myeloma, hypertension,CAD,  recent ORIF trocanteric femoral IM nail, chemotherapy.  He was discharged to home on 09/08/20 without the need for home health services or DME.  Plan No ongoing care management needs identified will plan [facility] care management case closure    Joylene Draft, RN, BSN  [place] Management Coordinator  [phone]- Mobile [phone]- [place]

## 2020-09-23 ENCOUNTER — Other Ambulatory Visit (HOSPITAL_COMMUNITY): Payer: Self-pay

## 2020-09-23 DIAGNOSIS — C9000 Multiple myeloma not having achieved remission: Secondary | ICD-10-CM

## 2020-09-23 MED ORDER — ACYCLOVIR 400 MG PO TABS
400.0000 mg | ORAL_TABLET | Freq: Two times a day (BID) | ORAL | 5 refills | Status: DC
  Filled 2020-09-23: qty 60, 30d supply, fill #0
  Filled 2020-10-21: qty 60, 30d supply, fill #1
  Filled 2020-11-18: qty 60, 30d supply, fill #2
  Filled 2020-12-19: qty 60, 30d supply, fill #3
  Filled 2021-01-21: qty 60, 30d supply, fill #4
  Filled 2021-02-18: qty 60, 30d supply, fill #5

## 2020-09-23 NOTE — Telephone Encounter (Signed)
Patient called inquiring about Acyclovir medication. Call placed to Irene who states that patient needs to have this medication filled at [facility] due to insurance. I have called and made the patient aware. He is agreeable. Prescription transferred per Dr. Tomie China previous order.

## 2020-09-23 NOTE — Progress Notes (Signed)
Eugene [name], [place] [address]   CLINIC:  Medical Oncology/Hematology  PCP:  [name], MD [address] / [place] [address] [phone]   REASON FOR VISIT:  Follow-up for multiple myeloma  PRIOR THERAPY: none  NGS Results: not done  CURRENT THERAPY: DaraVRd every 3 weeks x 6 cycles  BRIEF ONCOLOGIC HISTORY:  Oncology History  Multiple myeloma without remission ([place])  07/09/2020 Pathology Results   BONE MARROW, ASPIRATE, CLOT, CORE:  -Hypercellular bone marrow with plasma cell neoplasm  -See comment   PERIPHERAL BLOOD:  -Slight macrocytic anemia   COMMENT:   The bone marrow is hypercellular for age with trilineage hematopoiesis and nonspecific myeloid changes.  In this background, the plasma cells are increased in number representing 10% of all cells associated with atypical cytomorphologic features.  The plasma cells display kappa light chain restriction consistent with plasma cell neoplasm.  Correlation with cytogenetic and FISH studies is recommended.    07/24/2020 Initial Diagnosis   Multiple myeloma without remission ([place])   07/24/2020 Cancer Staging   Staging form: Plasma Cell Myeloma and Plasma Cell Disorders, AJCC 8th Edition - Clinical stage from 07/24/2020: RISS Stage I (Beta-2-microglobulin (mg/L): 2.1, Albumin (g/dL): 3.7, ISS: Stage I, High-risk cytogenetics: Absent, LDH: Normal) - Signed by Heath Lark, MD on 07/24/2020 Stage prefix: Initial diagnosis Beta 2 microglobulin range (mg/L): Less than 3.5 Albumin range (g/dL): Greater than or equal to 3.5 Cytogenetics: No abnormalities   09/03/2020 -  Chemotherapy    Patient is on Treatment Plan: MYELOMA NEWLY DIAGNOSED TRANSPLANT CANDIDATE DARAVRD (DARATUMUMAB SQ) Q21D X 6 CYCLES (INDUCTION/CONSOLIDATION)         CANCER STAGING: Cancer Staging Multiple myeloma without remission ([place]) Staging form: Plasma Cell Myeloma and Plasma Cell Disorders, AJCC 8th  Edition - Clinical stage from 07/24/2020: RISS Stage I (Beta-2-microglobulin (mg/L): 2.1, Albumin (g/dL): 3.7, ISS: Stage I, High-risk cytogenetics: Absent, LDH: Normal) - Signed by Heath Lark, MD on 07/24/2020   INTERVAL HISTORY:  Eugene [name], a 65 y.o. male, returns for routine follow-up and consideration for next cycle of chemotherapy. Bradey was last seen on 09/03/2020.  Due for cycle #2 of DaraVRd  today.   Overall, he tells me he has been feeling pretty well. He reports occasional nausea, constipation, and mild fatigue. He denies tingling and numbness. He has not started radiation therapy yet at this time.   Overall, he feels ready for next cycle of chemo today.   REVIEW OF SYSTEMS:  Review of Systems  Constitutional:  Positive for fatigue (60%). Negative for appetite change (75%).  Gastrointestinal:  Positive for constipation and nausea.  Neurological:  Negative for numbness.  All other systems reviewed and are negative.  PAST MEDICAL/SURGICAL HISTORY:  Past Medical History:  Diagnosis Date   Back pain    CAD (coronary artery disease)    a. 01/2017 NSTEMI/Cath: LM nl, LAD 25p - ? hypodense but no filling defect, RI nl, LCX nl, RCA nl, EF 55-65%-->Med Rx.   Erectile dysfunction    History of echocardiogram    a. 01/2017 Echo: EF 55-60%, no rwma.   Hypertension    Past Surgical History:  Procedure Laterality Date   LEFT HEART CATH AND CORONARY ANGIOGRAPHY N/A 01/15/2017   Procedure: LEFT HEART CATH AND CORONARY ANGIOGRAPHY;  Surgeon: Martinique, Peter M, MD;  [facility];  Service: Cardiovascular;  Laterality: N/A;   ORIF trocanteric femoral IM Nail Right  SOCIAL HISTORY:  Social History   Socioeconomic History   Marital status: Significant Other    Spouse name: Not on file   Number of children: Not on file   Years of education: Not on file   Highest education level: Not on file  Occupational History   Not on file  Tobacco Use   Smoking status:  Never   Smokeless tobacco: Never  Vaping Use   Vaping Use: Never used  Substance and Sexual Activity   Alcohol use: No   Drug use: No   Sexual activity: Yes  Other Topics Concern   Not on file  Social History Narrative   Not on file   Social Determinants of Health   Financial Resource Strain: Low Risk    Difficulty of Paying Living Expenses: Not hard at all  Food Insecurity: No Food Insecurity   Worried About Charity fundraiser in the Last Year: Never true   [place] in the Last Year: Never true  Transportation Needs: No Transportation Needs   Lack of Transportation (Medical): No   Lack of Transportation (Non-Medical): No  Physical Activity: Insufficiently Active   Days of Exercise per Week: 2 days   Minutes of Exercise per Session: 10 min  Stress: No Stress Concern Present   Feeling of Stress : Not at all  Social Connections: Moderately Integrated   Frequency of Communication with Friends and Family: More than three times a week   Frequency of Social Gatherings with Friends and Family: More than three times a week   Attends Religious Services: 1 to 4 times per year   Active Member of [employer] or Organizations: No   Attends Music therapist: 1 to 4 times per year   Marital Status: Divorced  Human resources officer Violence: Not At Risk   Fear of Current or Ex-Partner: No   Emotionally Abused: No   Physically Abused: No   Sexually Abused: No    FAMILY HISTORY:  Family History  Problem Relation Age of Onset   Cancer Mother        breast   Diabetes Mother    Diabetes Sister    Hypertension Sister    Cancer Brother    Mental illness Sister     CURRENT MEDICATIONS:  Current Outpatient Medications  Medication Sig Dispense Refill   acyclovir (ZOVIRAX) 400 MG tablet Take 1 tablet (400 mg total) by mouth 2 (two) times daily. 60 tablet 5   aspirin EC 81 MG tablet Take 1 tablet (81 mg total) by mouth daily with breakfast. 30 tablet 5   BORTEZOMIB IJ Inject  as directed once a week.     calcium-vitamin D (OSCAL WITH D) 500-200 MG-UNIT tablet Take 1 tablet by mouth.     DARATUMUMAB-HYALURONIDASE-FIHJ [place] Inject into the skin once a week.     dexamethasone (DECADRON) 4 MG tablet Take 10 tablets (40 mg total) by mouth once a week. 40 tablet 6   lenalidomide (REVLIMID) 25 MG capsule Take 1 capsule (25 mg total) by mouth daily. 14 days on 7 days off every 21 days 14 capsule 0   metoprolol succinate (TOPROL-XL) 25 MG 24 hr tablet TAKE 1 TABLET BY MOUTH DAILY. (Patient taking differently: Take 25 mg by mouth daily.) 90 tablet 3   Multiple Vitamin (MULTIVITAMIN) tablet Take 1 tablet by mouth daily.       nitroGLYCERIN (NITROSTAT) 0.4 MG SL tablet Place 1 tablet (0.4 mg total) under the tongue every 5 (five)  minutes for 3 doses as needed for chest pain. 25 tablet 3   rosuvastatin (CRESTOR) 40 MG tablet Take 1 tablet (40 mg total) by mouth daily. 90 tablet 3   traMADol (ULTRAM) 50 MG tablet Take 1 tablet (50 mg total) by mouth every 6 (six) hours as needed. 60 tablet 0   No current facility-administered medications for this visit.    ALLERGIES:  No Known Allergies  PHYSICAL EXAM:  Performance status (ECOG): 1 - Symptomatic but completely ambulatory  There were no vitals filed for this visit. Wt Readings from Last 3 Encounters:  09/18/20 212 lb 1.9 oz (96.2 kg)  09/17/20 216 lb 9.6 oz (98.2 kg)  09/10/20 216 lb 3.2 oz (98.1 kg)   Physical Exam Vitals reviewed.  Constitutional:      Appearance: Normal appearance.  Cardiovascular:     Rate and Rhythm: Normal rate and regular rhythm.     Pulses: Normal pulses.     Heart sounds: Normal heart sounds.  Pulmonary:     Effort: Pulmonary effort is normal.     Breath sounds: Normal breath sounds.  Neurological:     General: No focal deficit present.     Mental Status: He is alert and oriented to person, place, and time.  Psychiatric:        Mood and Affect: Mood normal.        Behavior: Behavior  normal.    LABORATORY DATA:  I have reviewed the labs as listed.  CBC Latest Ref Rng & Units 09/17/2020 09/10/2020 09/07/2020  WBC 4.0 - 10.5 K/uL 3.2(L) 4.5 4.8  Hemoglobin 13.0 - 17.0 g/dL 11.3(L) 10.8(L) 9.8(L)  Hematocrit 39.0 - 52.0 % 34.9(L) 33.7(L) 30.4(L)  Platelets 150 - 400 K/uL 291 291 222   CMP Latest Ref Rng & Units 09/17/2020 09/10/2020 09/06/2020  Glucose 70 - 99 mg/dL 144(H) 151(H) 118(H)  BUN 8 - 23 mg/dL _0 Creatinine 0.61 - 1.24 mg/dL 1.00 0.99 1.01  Sodium 135 - 145 mmol/L 133(L) 133(L) 136  Potassium 3.5 - 5.1 mmol/L 3.5 3.5 3.8  Chloride 98 - 111 mmol/L 99 101 106  CO2 22 - 32 mmol/L _1 Calcium 8.9 - 10.3 mg/dL 8.5(L) 8.7(L) 7.9(L)  Total Protein 6.5 - 8.1 g/dL 7.9 8.7(H) -  Total Bilirubin 0.3 - 1.2 mg/dL 0.7 0.6 -  Alkaline Phos 38 - 126 U/L 82 88 -  AST 15 - 41 U/L 21 30 -  ALT 0 - 44 U/L 20 40 -    DIAGNOSTIC IMAGING:  I have independently reviewed the scans and discussed with the patient. DG Chest 2 View  Result Date: 09/06/2020 CLINICAL DATA:  Dyspnea and respiratory abnormalities.  Fever. EXAM: CHEST - 2 VIEW COMPARISON:  09/04/2020 and 06/24/2018 FINDINGS: Coarse lung markings have minimally changed from the prior examinations. Heart size is within normal limits and stable. No large pleural effusions. No acute bone abnormality. IMPRESSION: Chronic lung changes without acute findings. Electronically Signed   By: Markus Daft M.D.   On: 09/06/2020 08:39   DG Chest 2 View  Result Date: 09/04/2020 CLINICAL DATA:  Fever, suspected sepsis EXAM: CHEST - 2 VIEW COMPARISON:  06/24/2018 FINDINGS: Frontal and lateral views of the chest demonstrate unremarkable cardiac silhouette. Lung volumes are diminished, patchy bibasilar consolidation greatest in the right infrahilar region. Findings could reflect hypoventilatory change or developing airspace disease. No effusion or pneumothorax. No acute bony abnormalities. IMPRESSION: 1. Low lung volumes, with patchy  bibasilar airspace  disease. Findings could reflect pneumonia given clinical presentation. Electronically Signed   By: Randa Ngo M.D.   On: 09/04/2020 21:09     ASSESSMENT:  1.  Standard risk plasma cell myeloma: - He reported low back pain radiating to the right thigh since April, pain gets worse when he walks for more than 2 blocks. - He had history of MGUS and was last seen in our clinic in 2019.  He had an abnormal M spike of 3.2 g on 12/06/2018. - Skeletal survey on 10/22/2017 showed no suspicious lytic lesions. - X-ray of the right sided pelvis on 06/11/2020 showed prominent lytic lesions noted in the proximal midportion of the right femoral diaphysis with no evidence of fracture or dislocation. - Bone marrow biopsy on 07/09/2020 with hypercellular marrow with trilineage hematopoiesis.  Plasma cells representing 10% of cells. - Chromosome analysis 46, XY (20).  Multiple myeloma FISH panel negative. - PET scan on 07/04/2020 with multiple hypermetabolic bony soft tissue lesions.  Dominant lesions in the L5 vertebral body and posterior right acetabulum and distal right femur.  Focal increased uptake in the right tonsillar region with associated soft tissue fullness on CT imaging. - 24-hour urine with total protein 134. - Beta-2 microglobulin 2.1 (06/26/2020), LDH 136.  2.  Social/family history: - He works at [employer] in environmental services. - He was never smoker. - 2 brothers died of metastatic lung cancer.  Sister has thyroid cancer.  Mother had breast cancer.   PLAN:  1.  Stage I standard risk IgG kappa multiple myeloma: - He has completed cycle 1 of Dara VRD. - We will continue 4 cycles of Dara VRD followed by transplant evaluation. - He has occasional nausea but denied any vomiting.  Otherwise he is tolerating treatments very well. - He had recent hospitalization after cycle 1 day 1 of treatment with pneumonia and fever.  He does not report any fevers at this time. -  Will increase his Revlimid to 25 mg 2 weeks on 1 week off.  He will proceed with cycle 2 today. - Reviewed myeloma labs from 09/17/2020 which showed M spike decreased to 1.6 from 2.9 g.  Light chain ratio also improved to 2.95 from 10.  CBC shows white count 3.6 with ANC of 900.  We will closely monitor ANC.  CMP was within normal limits. - He will wait and see how he feels after we increase the Revlimid dose, as he wants to go back to work. - RTC 3 weeks for follow-up with repeat labs.  We will also consider referral to transplant center.  2.  Right mid thigh pain: - Status post right femur ORIF trochanteric femoral IM nail on 08/09/2020. - He was recommended radiation therapy to the right femur by Dr. Ysidro Evert at [place] therapy was not started as he started systemic therapy. - He reports occasional pain in the right mid thigh which is controlled with NSAIDs.  He is requiring tramadol very rarely.  3.  Infection prophylaxis: - Continue acyclovir 400 mg twice daily.  Continue aspirin 81 mg for thromboprophylaxis.  4.  Myeloma bone disease: - Zometa was started on 09/03/2020. - He tolerated the dose very well.  Calcium today is 8.8.  We will continue Zometa every 4 weeks.   Orders placed this encounter:  No orders of the defined types were placed in this encounter.    Derek Jack, MD [place] [phone]   I, Thana Ates, am  acting as a scribe for Dr. Derek Jack.  I, Derek Jack MD, have reviewed the above documentation for accuracy and completeness, and I agree with the above.

## 2020-09-24 ENCOUNTER — Other Ambulatory Visit: Payer: Self-pay

## 2020-09-24 ENCOUNTER — Inpatient Hospital Stay (HOSPITAL_COMMUNITY): Payer: 59 | Admitting: Hematology

## 2020-09-24 ENCOUNTER — Inpatient Hospital Stay (HOSPITAL_COMMUNITY): Payer: 59

## 2020-09-24 VITALS — BP 125/76 | HR 89 | Temp 96.9°F | Resp 18 | Wt 216.4 lb

## 2020-09-24 DIAGNOSIS — C9000 Multiple myeloma not having achieved remission: Secondary | ICD-10-CM | POA: Diagnosis not present

## 2020-09-24 DIAGNOSIS — Z5112 Encounter for antineoplastic immunotherapy: Secondary | ICD-10-CM | POA: Diagnosis not present

## 2020-09-24 DIAGNOSIS — D472 Monoclonal gammopathy: Secondary | ICD-10-CM

## 2020-09-24 DIAGNOSIS — M898X5 Other specified disorders of bone, thigh: Secondary | ICD-10-CM

## 2020-09-24 DIAGNOSIS — M899 Disorder of bone, unspecified: Secondary | ICD-10-CM | POA: Diagnosis not present

## 2020-09-24 LAB — COMPREHENSIVE METABOLIC PANEL
ALT: 17 U/L (ref 0–44)
AST: 19 U/L (ref 15–41)
Albumin: 3.8 g/dL (ref 3.5–5.0)
Alkaline Phosphatase: 103 U/L (ref 38–126)
Anion gap: 9 (ref 5–15)
BUN: 14 mg/dL (ref 8–23)
CO2: 26 mmol/L (ref 22–32)
Calcium: 8.8 mg/dL — ABNORMAL LOW (ref 8.9–10.3)
Chloride: 103 mmol/L (ref 98–111)
Creatinine, Ser: 1.05 mg/dL (ref 0.61–1.24)
GFR, Estimated: >60 mL/min (ref >60)
Glucose, Bld: 139 mg/dL — ABNORMAL HIGH (ref 70–99)
Potassium: 3.7 mmol/L (ref 3.5–5.1)
Sodium: 138 mmol/L (ref 135–145)
Total Bilirubin: 0.7 mg/dL (ref 0.3–1.2)
Total Protein: 7.8 g/dL (ref 6.5–8.1)

## 2020-09-24 LAB — CBC WITH DIFFERENTIAL/PLATELET
Abs Immature Granulocytes: 0.01 10*3/uL (ref 0.00–0.07)
Basophils Absolute: 0 10*3/uL (ref 0.0–0.1)
Basophils Relative: 1 %
Eosinophils Absolute: 0.1 10*3/uL (ref 0.0–0.5)
Eosinophils Relative: 1 %
HCT: 38.4 % — ABNORMAL LOW (ref 39.0–52.0)
Hemoglobin: 12.2 g/dL — ABNORMAL LOW (ref 13.0–17.0)
Immature Granulocytes: 0 %
Lymphocytes Relative: 62 %
Lymphs Abs: 2.2 10*3/uL (ref 0.7–4.0)
MCH: 31.5 pg (ref 26.0–34.0)
MCHC: 31.8 g/dL (ref 30.0–36.0)
MCV: 99.2 fL (ref 80.0–100.0)
Monocytes Absolute: 0.4 10*3/uL (ref 0.1–1.0)
Monocytes Relative: 11 %
Neutro Abs: 0.9 10*3/uL — ABNORMAL LOW (ref 1.7–7.7)
Neutrophils Relative %: 25 %
Platelets: 264 10*3/uL (ref 150–400)
RBC: 3.87 MIL/uL — ABNORMAL LOW (ref 4.22–5.81)
RDW: 13.4 % (ref 11.5–15.5)
WBC: 3.6 10*3/uL — ABNORMAL LOW (ref 4.0–10.5)
nRBC: 0 % (ref 0.0–0.2)

## 2020-09-24 LAB — MAGNESIUM: Magnesium: 2 mg/dL (ref 1.7–2.4)

## 2020-09-24 LAB — LACTATE DEHYDROGENASE: LDH: 104 U/L (ref 98–192)

## 2020-09-24 MED ORDER — DEXAMETHASONE 4 MG PO TABS
40.0000 mg | ORAL_TABLET | Freq: Once | ORAL | Status: AC
  Administered 2020-09-24: 40 mg via ORAL
  Filled 2020-09-24: qty 10

## 2020-09-24 MED ORDER — ACETAMINOPHEN 325 MG PO TABS
650.0000 mg | ORAL_TABLET | Freq: Once | ORAL | Status: AC
  Administered 2020-09-24: 650 mg via ORAL
  Filled 2020-09-24: qty 2

## 2020-09-24 MED ORDER — DARATUMUMAB-HYALURONIDASE-FIHJ 1800-30000 MG-UT/15ML ~~LOC~~ SOLN
1800.0000 mg | Freq: Once | SUBCUTANEOUS | Status: AC
  Administered 2020-09-24: 1800 mg via SUBCUTANEOUS
  Filled 2020-09-24: qty 15

## 2020-09-24 MED ORDER — BORTEZOMIB CHEMO SQ INJECTION 3.5 MG (2.5MG/ML)
1.3000 mg/m2 | Freq: Once | INTRAMUSCULAR | Status: AC
  Administered 2020-09-24: 2.75 mg via SUBCUTANEOUS
  Filled 2020-09-24: qty 1.1

## 2020-09-24 MED ORDER — DIPHENHYDRAMINE HCL 25 MG PO CAPS
25.0000 mg | ORAL_CAPSULE | Freq: Once | ORAL | Status: AC
  Administered 2020-09-24: 25 mg via ORAL
  Filled 2020-09-24: qty 1

## 2020-09-24 NOTE — Patient Instructions (Signed)
[facility]  Discharge Instructions: Thank you for choosing [facility] to provide your oncology and hematology care.  If you have a lab appointment with the Cancer Center, please come in thru the Main Entrance and check in at the main information desk.  Wear comfortable clothing and clothing appropriate for easy access to any Portacath or PICC line.   We strive to give you quality time with your provider. You may need to reschedule your appointment if you arrive late (15 or more minutes).  Arriving late affects you and other patients whose appointments are after yours.  Also, if you miss three or more appointments without notifying the office, you may be dismissed from the clinic at the provider's discretion.      For prescription refill requests, have your pharmacy contact our office and allow 72 hours for refills to be completed.        To help prevent nausea and vomiting after your treatment, we encourage you to take your nausea medication as directed.  BELOW ARE SYMPTOMS THAT SHOULD BE REPORTED IMMEDIATELY: *FEVER GREATER THAN 100.4 F (38 C) OR HIGHER *CHILLS OR SWEATING *NAUSEA AND VOMITING THAT IS NOT CONTROLLED WITH YOUR NAUSEA MEDICATION *UNUSUAL SHORTNESS OF BREATH *UNUSUAL BRUISING OR BLEEDING *URINARY PROBLEMS (pain or burning when urinating, or frequent urination) *BOWEL PROBLEMS (unusual diarrhea, constipation, pain near the anus) TENDERNESS IN MOUTH AND THROAT WITH OR WITHOUT PRESENCE OF ULCERS (sore throat, sores in mouth, or a toothache) UNUSUAL RASH, SWELLING OR PAIN  UNUSUAL VAGINAL DISCHARGE OR ITCHING   Items with * indicate a potential emergency and should be followed up as soon as possible or go to the Emergency Department if any problems should occur.  Please show the CHEMOTHERAPY ALERT CARD or IMMUNOTHERAPY ALERT CARD at check-in to the Emergency Department and triage nurse.  Should you have questions after your visit or need to cancel  or reschedule your appointment, please contact [facility] [phone]  and follow the prompts.  Office hours are 8:00 a.m. to 4:30 p.m. Monday - Friday. Please note that voicemails left after 4:00 p.m. may not be returned until the following business day.  We are closed weekends and major holidays. You have access to a nurse at all times for urgent questions. Please call the main number to the clinic [phone] and follow the prompts.  For any non-urgent questions, you may also contact your provider using MyChart. We now offer e-Visits for anyone 18 and older to request care online for non-urgent symptoms. For details visit [email or web address].   Also download the MyChart app! Go to the app store, search "MyChart", open the app, select [place], and log in with your MyChart username and password.  Due to Covid, a mask is required upon entering the hospital/clinic. If you do not have a mask, one will be given to you upon arrival. For doctor visits, patients may have 1 support person aged 18 or older with them. For treatment visits, patients cannot have anyone with them due to current Covid guidelines and our immunocompromised population.  

## 2020-09-24 NOTE — Patient Instructions (Addendum)
[facility] Discharge Instructions  You were seen today by Dr. Katragadda. He went over your recent results, and you received your treatment. Dr. Katragadda will see you back in 3 weeks for labs and follow up.   Thank you for choosing [facility] at [facility] to provide your oncology and hematology care.  To afford each patient quality time with our provider, please arrive at least 15 minutes before your scheduled appointment time.   If you have a lab appointment with the Cancer Center please come in thru the Main Entrance and check in at the main information desk  You need to re-schedule your appointment should you arrive 10 or more minutes late.  We strive to give you quality time with our providers, and arriving late affects you and other patients whose appointments are after yours.  Also, if you no show three or more times for appointments you may be dismissed from the clinic at the providers discretion.     Again, thank you for choosing [facility].  Our hope is that these requests will decrease the amount of time that you wait before being seen by our physicians.       _____________________________________________________________  Should you have questions after your visit to [facility], please contact our office at [phone] between the hours of 8:00 a.m. and 4:30 p.m.  Voicemails left after 4:00 p.m. will not be returned until the following business day.  For prescription refill requests, have your pharmacy contact our office and allow 72 hours.    Cancer Center Support Programs:   > Cancer Support Group  2nd Tuesday of the month 1pm-2pm, Journey Room   

## 2020-09-24 NOTE — Progress Notes (Signed)
Placed a call to [facility] to inquire about Revlimid delivery from prescription sent on 09/17/20. Pharmacy had the incorrect phone number for the patient. Correct contact information given to the pharmacy. Patient arranging delivery with the pharmacy now.

## 2020-09-24 NOTE — Progress Notes (Signed)
Patient has been examined, vital signs and labs have been reviewed by Dr. Katragadda. ANC, Creatinine, LFTs, hemoglobin, and platelets are within treatment parameters per Dr. Katragadda. Patient is okay to proceed with treatment per M.D.   

## 2020-09-24 NOTE — Progress Notes (Signed)
Patient presents today for chemotherapy treatment.  Patient is in satisfactory condition with no new complaints voiced.  Vital signs are stable.  Labs reviewed by Dr. Delton Coombes during his office visit.  We will proceed with treatment per MD orders.  Patient is taking Revlimid and Decadron as prescribed.  They have not missed any doses and report no side effects at this time.    Patient tolerated treatment well with no complaints voiced.  Patient left ambulatory in stable condition.  Vital signs stable at discharge.  Follow up as scheduled.

## 2020-10-01 ENCOUNTER — Inpatient Hospital Stay (HOSPITAL_COMMUNITY): Payer: 59

## 2020-10-01 ENCOUNTER — Other Ambulatory Visit: Payer: Self-pay

## 2020-10-01 VITALS — BP 114/70 | HR 67 | Temp 96.8°F | Resp 18 | Wt 218.0 lb

## 2020-10-01 DIAGNOSIS — C9000 Multiple myeloma not having achieved remission: Secondary | ICD-10-CM

## 2020-10-01 DIAGNOSIS — Z5112 Encounter for antineoplastic immunotherapy: Secondary | ICD-10-CM | POA: Diagnosis not present

## 2020-10-01 LAB — CBC WITH DIFFERENTIAL/PLATELET
Abs Immature Granulocytes: 0.03 10*3/uL (ref 0.00–0.07)
Basophils Absolute: 0 10*3/uL (ref 0.0–0.1)
Basophils Relative: 0 %
Eosinophils Absolute: 0.2 10*3/uL (ref 0.0–0.5)
Eosinophils Relative: 4 %
HCT: 37.3 % — ABNORMAL LOW (ref 39.0–52.0)
Hemoglobin: 11.7 g/dL — ABNORMAL LOW (ref 13.0–17.0)
Immature Granulocytes: 1 %
Lymphocytes Relative: 57 %
Lymphs Abs: 2.2 10*3/uL (ref 0.7–4.0)
MCH: 31.6 pg (ref 26.0–34.0)
MCHC: 31.4 g/dL (ref 30.0–36.0)
MCV: 100.8 fL — ABNORMAL HIGH (ref 80.0–100.0)
Monocytes Absolute: 0.3 10*3/uL (ref 0.1–1.0)
Monocytes Relative: 7 %
Neutro Abs: 1.2 10*3/uL — ABNORMAL LOW (ref 1.7–7.7)
Neutrophils Relative %: 31 %
Platelets: 213 10*3/uL (ref 150–400)
RBC: 3.7 MIL/uL — ABNORMAL LOW (ref 4.22–5.81)
RDW: 14 % (ref 11.5–15.5)
WBC: 3.9 10*3/uL — ABNORMAL LOW (ref 4.0–10.5)
nRBC: 0 % (ref 0.0–0.2)

## 2020-10-01 LAB — COMPREHENSIVE METABOLIC PANEL
ALT: 16 U/L (ref 0–44)
AST: 18 U/L (ref 15–41)
Albumin: 3.7 g/dL (ref 3.5–5.0)
Alkaline Phosphatase: 91 U/L (ref 38–126)
Anion gap: 5 (ref 5–15)
BUN: 13 mg/dL (ref 8–23)
CO2: 26 mmol/L (ref 22–32)
Calcium: 8.2 mg/dL — ABNORMAL LOW (ref 8.9–10.3)
Chloride: 103 mmol/L (ref 98–111)
Creatinine, Ser: 1.05 mg/dL (ref 0.61–1.24)
GFR, Estimated: >60 mL/min (ref >60)
Glucose, Bld: 138 mg/dL — ABNORMAL HIGH (ref 70–99)
Potassium: 3.8 mmol/L (ref 3.5–5.1)
Sodium: 134 mmol/L — ABNORMAL LOW (ref 135–145)
Total Bilirubin: 0.9 mg/dL (ref 0.3–1.2)
Total Protein: 7.3 g/dL (ref 6.5–8.1)

## 2020-10-01 LAB — MAGNESIUM: Magnesium: 1.9 mg/dL (ref 1.7–2.4)

## 2020-10-01 MED ORDER — ACETAMINOPHEN 325 MG PO TABS
650.0000 mg | ORAL_TABLET | Freq: Once | ORAL | Status: AC
  Administered 2020-10-01: 650 mg via ORAL
  Filled 2020-10-01: qty 2

## 2020-10-01 MED ORDER — BORTEZOMIB CHEMO SQ INJECTION 3.5 MG (2.5MG/ML)
1.3000 mg/m2 | Freq: Once | INTRAMUSCULAR | Status: AC
  Administered 2020-10-01: 2.75 mg via SUBCUTANEOUS
  Filled 2020-10-01: qty 1.1

## 2020-10-01 MED ORDER — ZOLEDRONIC ACID 4 MG/100ML IV SOLN
4.0000 mg | Freq: Once | INTRAVENOUS | Status: DC
  Filled 2020-10-01: qty 100

## 2020-10-01 MED ORDER — SODIUM CHLORIDE 0.9 % IV SOLN: Freq: Once | INTRAVENOUS | Status: DC

## 2020-10-01 MED ORDER — DARATUMUMAB-HYALURONIDASE-FIHJ 1800-30000 MG-UT/15ML ~~LOC~~ SOLN
1800.0000 mg | Freq: Once | SUBCUTANEOUS | Status: AC
  Administered 2020-10-01: 1800 mg via SUBCUTANEOUS
  Filled 2020-10-01: qty 15

## 2020-10-01 MED ORDER — DEXAMETHASONE 4 MG PO TABS
40.0000 mg | ORAL_TABLET | Freq: Once | ORAL | Status: AC
  Administered 2020-10-01: 40 mg via ORAL
  Filled 2020-10-01: qty 10

## 2020-10-01 MED ORDER — DIPHENHYDRAMINE HCL 25 MG PO CAPS
25.0000 mg | ORAL_CAPSULE | Freq: Once | ORAL | Status: AC
  Administered 2020-10-01: 25 mg via ORAL
  Filled 2020-10-01: qty 1

## 2020-10-01 NOTE — Progress Notes (Signed)
Zometa clarified by Dr Delton Coombes:  Zometa 4 mg IVPB q 4 weeks - orders updated to reflect this change.  T.O. Dr Rhys Martini, PharmD

## 2020-10-01 NOTE — Progress Notes (Signed)
Chaplain engaged in a follow-up visit with Eugene [name].  Chaplain and Eugene [name] talked about family, church, life, forgiveness and more.  Chaplain and Eugene [name] also discussed ways that Eugene [name] can prepare for infusions through hydrating himself more.   Chaplain offered listening, support and presence.  Chaplain will follow-up.    10/01/20 1100  Clinical Encounter Type  Visited With Patient  Visit Type Follow-up;Spiritual support;Social support

## 2020-10-01 NOTE — Patient Instructions (Signed)
[facility]  Discharge Instructions: Thank you for choosing [facility] to provide your oncology and hematology care.  If you have a lab appointment with the Cancer Center, please come in thru the Main Entrance and check in at the main information desk.  Wear comfortable clothing and clothing appropriate for easy access to any Portacath or PICC line.   We strive to give you quality time with your provider. You may need to reschedule your appointment if you arrive late (15 or more minutes).  Arriving late affects you and other patients whose appointments are after yours.  Also, if you miss three or more appointments without notifying the office, you may be dismissed from the clinic at the provider's discretion.      For prescription refill requests, have your pharmacy contact our office and allow 72 hours for refills to be completed.        To help prevent nausea and vomiting after your treatment, we encourage you to take your nausea medication as directed.  BELOW ARE SYMPTOMS THAT SHOULD BE REPORTED IMMEDIATELY: *FEVER GREATER THAN 100.4 F (38 C) OR HIGHER *CHILLS OR SWEATING *NAUSEA AND VOMITING THAT IS NOT CONTROLLED WITH YOUR NAUSEA MEDICATION *UNUSUAL SHORTNESS OF BREATH *UNUSUAL BRUISING OR BLEEDING *URINARY PROBLEMS (pain or burning when urinating, or frequent urination) *BOWEL PROBLEMS (unusual diarrhea, constipation, pain near the anus) TENDERNESS IN MOUTH AND THROAT WITH OR WITHOUT PRESENCE OF ULCERS (sore throat, sores in mouth, or a toothache) UNUSUAL RASH, SWELLING OR PAIN  UNUSUAL VAGINAL DISCHARGE OR ITCHING   Items with * indicate a potential emergency and should be followed up as soon as possible or go to the Emergency Department if any problems should occur.  Please show the CHEMOTHERAPY ALERT CARD or IMMUNOTHERAPY ALERT CARD at check-in to the Emergency Department and triage nurse.  Should you have questions after your visit or need to cancel  or reschedule your appointment, please contact  CANCER CENTER [phone]  and follow the prompts.  Office hours are 8:00 a.m. to 4:30 p.m. Monday - Friday. Please note that voicemails left after 4:00 p.m. may not be returned until the following business day.  We are closed weekends and major holidays. You have access to a nurse at all times for urgent questions. Please call the main number to the clinic [phone] and follow the prompts.  For any non-urgent questions, you may also contact your provider using MyChart. We now offer e-Visits for anyone 18 and older to request care online for non-urgent symptoms. For details visit [email or web address].   Also download the MyChart app! Go to the app store, search "MyChart", open the app, select [facility] [place], and log in with your MyChart username and password.  Due to Covid, a mask is required upon entering the hospital/clinic. If you do not have a mask, one will be given to you upon arrival. For doctor visits, patients may have 1 support person aged 18 or older with them. For treatment visits, patients cannot have anyone with them due to current Covid guidelines and our immunocompromised population.  

## 2020-10-01 NOTE — Progress Notes (Signed)
Patient presents today for chemotherapy injections.  Patient is in satisfactory condition with no new complaints voiced.  Labs reviewed and all are within treatment parameters.  Vital signs are stable.  We will proceed with treatment per MD orders.   Multiple unsuccessful attempts to get a peripheral IV for Zometa infusion today.  Advised patient to increase fluids.  We will do Zometa next week.    Patient is taking Revlimid as prescribed.  They have not missed any doses and report no side effects at this time.    Patient tolerated treatment well with no complaints voiced.  Patient left ambulatory in stable condition.  Vital signs stable at discharge.  Follow up as scheduled.

## 2020-10-02 ENCOUNTER — Other Ambulatory Visit (HOSPITAL_COMMUNITY): Payer: Self-pay

## 2020-10-02 MED FILL — Metoprolol Succinate Tab ER 24HR 25 MG (Tartrate Equiv): ORAL | 90 days supply | Qty: 90 | Fill #1 | Status: AC

## 2020-10-07 ENCOUNTER — Encounter (HOSPITAL_COMMUNITY): Payer: Self-pay

## 2020-10-07 NOTE — Progress Notes (Signed)
Patient called reporting clear sinus drainage. No fever, patient does not feel bad and reports no additional symptoms. Dr. Delton Coombes made aware. Patient to try OTC relief medications and call for s/s of infection. Patient verbalized understanding.

## 2020-10-08 ENCOUNTER — Inpatient Hospital Stay (HOSPITAL_COMMUNITY): Payer: 59

## 2020-10-08 ENCOUNTER — Other Ambulatory Visit: Payer: Self-pay

## 2020-10-08 VITALS — BP 134/69 | HR 63 | Temp 96.1°F | Resp 18 | Wt 222.0 lb

## 2020-10-08 DIAGNOSIS — C9000 Multiple myeloma not having achieved remission: Secondary | ICD-10-CM

## 2020-10-08 DIAGNOSIS — Z5112 Encounter for antineoplastic immunotherapy: Secondary | ICD-10-CM | POA: Diagnosis not present

## 2020-10-08 LAB — COMPREHENSIVE METABOLIC PANEL
ALT: 20 U/L (ref 0–44)
AST: 20 U/L (ref 15–41)
Albumin: 3.7 g/dL (ref 3.5–5.0)
Alkaline Phosphatase: 94 U/L (ref 38–126)
Anion gap: 7 (ref 5–15)
BUN: 12 mg/dL (ref 8–23)
CO2: 25 mmol/L (ref 22–32)
Calcium: 8.1 mg/dL — ABNORMAL LOW (ref 8.9–10.3)
Chloride: 105 mmol/L (ref 98–111)
Creatinine, Ser: 0.98 mg/dL (ref 0.61–1.24)
GFR, Estimated: >60 mL/min (ref >60)
Glucose, Bld: 114 mg/dL — ABNORMAL HIGH (ref 70–99)
Potassium: 3.2 mmol/L — ABNORMAL LOW (ref 3.5–5.1)
Sodium: 137 mmol/L (ref 135–145)
Total Bilirubin: 0.5 mg/dL (ref 0.3–1.2)
Total Protein: 7.2 g/dL (ref 6.5–8.1)

## 2020-10-08 LAB — CBC WITH DIFFERENTIAL/PLATELET
Abs Immature Granulocytes: 0.03 10*3/uL (ref 0.00–0.07)
Basophils Absolute: 0 10*3/uL (ref 0.0–0.1)
Basophils Relative: 0 %
Eosinophils Absolute: 0.1 10*3/uL (ref 0.0–0.5)
Eosinophils Relative: 3 %
HCT: 37.8 % — ABNORMAL LOW (ref 39.0–52.0)
Hemoglobin: 12.2 g/dL — ABNORMAL LOW (ref 13.0–17.0)
Immature Granulocytes: 1 %
Lymphocytes Relative: 42 %
Lymphs Abs: 2.1 10*3/uL (ref 0.7–4.0)
MCH: 32.2 pg (ref 26.0–34.0)
MCHC: 32.3 g/dL (ref 30.0–36.0)
MCV: 99.7 fL (ref 80.0–100.0)
Monocytes Absolute: 0.6 10*3/uL (ref 0.1–1.0)
Monocytes Relative: 13 %
Neutro Abs: 2 10*3/uL (ref 1.7–7.7)
Neutrophils Relative %: 41 %
Platelets: 274 10*3/uL (ref 150–400)
RBC: 3.79 MIL/uL — ABNORMAL LOW (ref 4.22–5.81)
RDW: 13.9 % (ref 11.5–15.5)
WBC: 4.9 10*3/uL (ref 4.0–10.5)
nRBC: 0 % (ref 0.0–0.2)

## 2020-10-08 LAB — MAGNESIUM: Magnesium: 2 mg/dL (ref 1.7–2.4)

## 2020-10-08 LAB — LACTATE DEHYDROGENASE: LDH: 109 U/L (ref 98–192)

## 2020-10-08 MED ORDER — DARATUMUMAB-HYALURONIDASE-FIHJ 1800-30000 MG-UT/15ML ~~LOC~~ SOLN
1800.0000 mg | Freq: Once | SUBCUTANEOUS | Status: AC
  Administered 2020-10-08: 1800 mg via SUBCUTANEOUS
  Filled 2020-10-08: qty 15

## 2020-10-08 MED ORDER — ZOLEDRONIC ACID 4 MG/100ML IV SOLN
4.0000 mg | Freq: Once | INTRAVENOUS | Status: AC
  Administered 2020-10-08: 4 mg via INTRAVENOUS
  Filled 2020-10-08: qty 100

## 2020-10-08 MED ORDER — SODIUM CHLORIDE 0.9 % IV SOLN: Freq: Once | INTRAVENOUS | Status: AC

## 2020-10-08 MED ORDER — DEXAMETHASONE 4 MG PO TABS
40.0000 mg | ORAL_TABLET | Freq: Once | ORAL | Status: AC
  Administered 2020-10-08: 40 mg via ORAL
  Filled 2020-10-08: qty 10

## 2020-10-08 MED ORDER — ACETAMINOPHEN 325 MG PO TABS
650.0000 mg | ORAL_TABLET | Freq: Once | ORAL | Status: AC
  Administered 2020-10-08: 650 mg via ORAL
  Filled 2020-10-08: qty 2

## 2020-10-08 MED ORDER — DIPHENHYDRAMINE HCL 25 MG PO CAPS
25.0000 mg | ORAL_CAPSULE | Freq: Once | ORAL | Status: AC
  Administered 2020-10-08: 25 mg via ORAL
  Filled 2020-10-08: qty 1

## 2020-10-08 MED ORDER — POTASSIUM CHLORIDE CRYS ER 20 MEQ PO TBCR
40.0000 meq | EXTENDED_RELEASE_TABLET | Freq: Once | ORAL | Status: AC
  Administered 2020-10-08: 40 meq via ORAL
  Filled 2020-10-08: qty 2

## 2020-10-08 NOTE — Patient Instructions (Signed)
[facility]  Discharge Instructions: Thank you for choosing [facility] to provide your oncology and hematology care.  If you have a lab appointment with the Cancer Center, please come in thru the Main Entrance and check in at the main information desk.  Wear comfortable clothing and clothing appropriate for easy access to any Portacath or PICC line.   We strive to give you quality time with your provider. You may need to reschedule your appointment if you arrive late (15 or more minutes).  Arriving late affects you and other patients whose appointments are after yours.  Also, if you miss three or more appointments without notifying the office, you may be dismissed from the clinic at the provider's discretion.      For prescription refill requests, have your pharmacy contact our office and allow 72 hours for refills to be completed.        To help prevent nausea and vomiting after your treatment, we encourage you to take your nausea medication as directed.  BELOW ARE SYMPTOMS THAT SHOULD BE REPORTED IMMEDIATELY: *FEVER GREATER THAN 100.4 F (38 C) OR HIGHER *CHILLS OR SWEATING *NAUSEA AND VOMITING THAT IS NOT CONTROLLED WITH YOUR NAUSEA MEDICATION *UNUSUAL SHORTNESS OF BREATH *UNUSUAL BRUISING OR BLEEDING *URINARY PROBLEMS (pain or burning when urinating, or frequent urination) *BOWEL PROBLEMS (unusual diarrhea, constipation, pain near the anus) TENDERNESS IN MOUTH AND THROAT WITH OR WITHOUT PRESENCE OF ULCERS (sore throat, sores in mouth, or a toothache) UNUSUAL RASH, SWELLING OR PAIN  UNUSUAL VAGINAL DISCHARGE OR ITCHING   Items with * indicate a potential emergency and should be followed up as soon as possible or go to the Emergency Department if any problems should occur.  Please show the CHEMOTHERAPY ALERT CARD or IMMUNOTHERAPY ALERT CARD at check-in to the Emergency Department and triage nurse.  Should you have questions after your visit or need to cancel  or reschedule your appointment, please contact [facility] [phone]  and follow the prompts.  Office hours are 8:00 a.m. to 4:30 p.m. Monday - Friday. Please note that voicemails left after 4:00 p.m. may not be returned until the following business day.  We are closed weekends and major holidays. You have access to a nurse at all times for urgent questions. Please call the main number to the clinic [phone] and follow the prompts.  For any non-urgent questions, you may also contact your provider using MyChart. We now offer e-Visits for anyone 18 and older to request care online for non-urgent symptoms. For details visit [email or web address].   Also download the MyChart app! Go to the app store, search "MyChart", open the app, select [facility], and log in with your MyChart username and password.  Due to Covid, a mask is required upon entering the hospital/clinic. If you do not have a mask, one will be given to you upon arrival. For doctor visits, patients may have 1 support person aged 18 or older with them. For treatment visits, patients cannot have anyone with them due to current Covid guidelines and our immunocompromised population.  

## 2020-10-08 NOTE — Progress Notes (Signed)
Patient presents today for chemotherapy treatment.  Patient is in satisfactory condition with no new complaints voiced today.  Vital signs are stable.  All labs are within treatment parameters.  Potassium today is 3.2.  We will give Klor Con 40 mEq orally today per standing orders.  We will proceed with treatment per MD orders.    Patient tolerated treatment well with no complaints voiced.  Patient left ambulatory in stable condition.  Vital signs stable at discharge.  Follow up as scheduled.

## 2020-10-09 ENCOUNTER — Other Ambulatory Visit (HOSPITAL_COMMUNITY): Payer: Self-pay | Admitting: Hematology

## 2020-10-09 LAB — KAPPA/LAMBDA LIGHT CHAINS
Kappa free light chain: 25.1 mg/L — ABNORMAL HIGH (ref 3.3–19.4)
Kappa, lambda light chain ratio: 2.24 — ABNORMAL HIGH (ref 0.26–1.65)
Lambda free light chains: 11.2 mg/L (ref 5.7–26.3)

## 2020-10-09 NOTE — Telephone Encounter (Signed)
Chart reviewed. Revlimid refilled per last office note with Dr. Katragadda.  

## 2020-10-10 LAB — PROTEIN ELECTROPHORESIS, SERUM
A/G Ratio: 1.1 (ref 0.7–1.7)
Albumin ELP: 3.5 g/dL (ref 2.9–4.4)
Alpha-1-Globulin: 0.2 g/dL (ref 0.0–0.4)
Alpha-2-Globulin: 0.7 g/dL (ref 0.4–1.0)
Beta Globulin: 1 g/dL (ref 0.7–1.3)
Gamma Globulin: 1.2 g/dL (ref 0.4–1.8)
Globulin, Total: 3.2 g/dL (ref 2.2–3.9)
M-Spike, %: 1 g/dL — ABNORMAL HIGH
Total Protein ELP: 6.7 g/dL (ref 6.0–8.5)

## 2020-10-14 NOTE — Progress Notes (Signed)
[place], [place] [address]   CLINIC:  Medical Oncology/Hematology  PCP:  [name], MD [address] / [place] [address] [phone]   REASON FOR VISIT:  Follow-up for multiple myeloma  PRIOR THERAPY: none  NGS Results: not done  CURRENT THERAPY: DaraVRd every 3 weeks x 6 cycles  BRIEF ONCOLOGIC HISTORY:  Oncology History  Multiple myeloma without remission ([place])  07/09/2020 Pathology Results   BONE MARROW, ASPIRATE, CLOT, CORE:  -Hypercellular bone marrow with plasma cell neoplasm  -See comment   PERIPHERAL BLOOD:  -Slight macrocytic anemia   COMMENT:   The bone marrow is hypercellular for age with trilineage hematopoiesis and nonspecific myeloid changes.  In this background, the plasma cells are increased in number representing 10% of all cells associated with atypical cytomorphologic features.  The plasma cells display kappa light chain restriction consistent with plasma cell neoplasm.  Correlation with cytogenetic and FISH studies is recommended.    07/24/2020 Initial Diagnosis   Multiple myeloma without remission ([place])   07/24/2020 Cancer Staging   Staging form: Plasma Cell Myeloma and Plasma Cell Disorders, AJCC 8th Edition - Clinical stage from 07/24/2020: RISS Stage I (Beta-2-microglobulin (mg/L): 2.1, Albumin (g/dL): 3.7, ISS: Stage I, High-risk cytogenetics: Absent, LDH: Normal) - Signed by Heath Lark, MD on 07/24/2020 Stage prefix: Initial diagnosis Beta 2 microglobulin range (mg/L): Less than 3.5 Albumin range (g/dL): Greater than or equal to 3.5 Cytogenetics: No abnormalities   09/03/2020 -  Chemotherapy   Patient is on Treatment Plan : MYELOMA NEWLY DIAGNOSED TRANSPLANT CANDIDATE DaraVRd (Daratumumab SQ) q21d x 6 Cycles (Induction/Consolidation)       CANCER STAGING: Cancer Staging Multiple myeloma without remission ([place]) Staging form: Plasma Cell Myeloma and Plasma Cell Disorders, AJCC 8th  Edition - Clinical stage from 07/24/2020: RISS Stage I (Beta-2-microglobulin (mg/L): 2.1, Albumin (g/dL): 3.7, ISS: Stage I, High-risk cytogenetics: Absent, LDH: Normal) - Signed by Heath Lark, MD on 07/24/2020   INTERVAL HISTORY:  [name], a 65 y.o. male, returns for routine follow-up and consideration for next cycle of chemotherapy. Eugene [name] was last seen on 09/24/2020.  Due for cycle #3 of DaraVRd today.   Overall, he tells me he has been feeling pretty well. He reports occasional numbness in his hands at night, and occasional constipation. He denies abdominal pain, fever, infections, diarrhea, and leg swellings. His appetite is good.  Overall, he feels ready for next cycle of chemo today.   REVIEW OF SYSTEMS:  Review of Systems  Constitutional:  Negative for appetite change (80%), fatigue (60%) and fever.  Respiratory:  Positive for cough (sinus).   Cardiovascular:  Negative for leg swelling.  Gastrointestinal:  Positive for constipation. Negative for abdominal pain and diarrhea.  Neurological:  Positive for numbness.  All other systems reviewed and are negative.  PAST MEDICAL/SURGICAL HISTORY:  Past Medical History:  Diagnosis Date   Back pain    CAD (coronary artery disease)    a. 01/2017 NSTEMI/Cath: LM nl, LAD 25p - ? hypodense but no filling defect, RI nl, LCX nl, RCA nl, EF 55-65%-->Med Rx.   Erectile dysfunction    History of echocardiogram    a. 01/2017 Echo: EF 55-60%, no rwma.   Hypertension    Past Surgical History:  Procedure Laterality Date   LEFT HEART CATH AND CORONARY ANGIOGRAPHY N/A 01/15/2017   Procedure: LEFT HEART CATH AND CORONARY ANGIOGRAPHY;  Surgeon: Martinique, Peter M, MD;  Location: [facility];  Service: Cardiovascular;  Laterality: N/A;   ORIF trocanteric femoral IM Nail Right     SOCIAL HISTORY:  Social History   Socioeconomic History   Marital status: Significant Other    Spouse name: Not on file   Number of children: Not on file    Years of education: Not on file   Highest education level: Not on file  Occupational History   Not on file  Tobacco Use   Smoking status: Never   Smokeless tobacco: Never  Vaping Use   Vaping Use: Never used  Substance and Sexual Activity   Alcohol use: No   Drug use: No   Sexual activity: Yes  Other Topics Concern   Not on file  Social History Narrative   Not on file   Social Determinants of Health   Financial Resource Strain: Low Risk    Difficulty of Paying Living Expenses: Not hard at all  Food Insecurity: No Food Insecurity   Worried About Charity fundraiser in the Last Year: Never true   Ran Out of Food in the Last Year: Never true  Transportation Needs: No Transportation Needs   Lack of Transportation (Medical): No   Lack of Transportation (Non-Medical): No  Physical Activity: Insufficiently Active   Days of Exercise per Week: 2 days   Minutes of Exercise per Session: 10 min  Stress: No Stress Concern Present   Feeling of Stress : Not at all  Social Connections: Moderately Integrated   Frequency of Communication with Friends and Family: More than three times a week   Frequency of Social Gatherings with Friends and Family: More than three times a week   Attends Religious Services: 1 to 4 times per year   Active Member of [employer] or Organizations: No   Attends Music therapist: 1 to 4 times per year   Marital Status: Divorced  Human resources officer Violence: Not At Risk   Fear of Current or Ex-Partner: No   Emotionally Abused: No   Physically Abused: No   Sexually Abused: No    FAMILY HISTORY:  Family History  Problem Relation Age of Onset   Cancer Mother        breast   Diabetes Mother    Diabetes Sister    Hypertension Sister    Cancer Brother    Mental illness Sister     CURRENT MEDICATIONS:  Current Outpatient Medications  Medication Sig Dispense Refill   acyclovir (ZOVIRAX) 400 MG tablet Take 1 tablet (400 mg total) by mouth 2 (two)  times daily. 60 tablet 5   aspirin EC 81 MG tablet Take 1 tablet (81 mg total) by mouth daily with breakfast. 30 tablet 5   BORTEZOMIB IJ Inject as directed once a week.     calcium-vitamin D (OSCAL WITH D) 500-200 MG-UNIT tablet Take 1 tablet by mouth.     DARATUMUMAB-HYALURONIDASE-FIHJ [place] Inject into the skin once a week.     dexamethasone (DECADRON) 4 MG tablet Take 10 tablets (40 mg total) by mouth once a week. 40 tablet 6   metoprolol succinate (TOPROL-XL) 25 MG 24 hr tablet TAKE 1 TABLET BY MOUTH DAILY. (Patient taking differently: Take 25 mg by mouth daily.) 90 tablet 3   Multiple Vitamin (MULTIVITAMIN) tablet Take 1 tablet by mouth daily.       nitroGLYCERIN (NITROSTAT) 0.4 MG SL tablet Place 1 tablet (0.4 mg total) under the tongue every 5 (five) minutes for 3 doses as needed for chest pain. 25 tablet  3   REVLIMID 25 MG capsule TAKE 1 CAPSULE BY MOUTH ONCE DAILY FOR 14 DAYS ON AND 7 DAYS OFF 14 capsule 0   rosuvastatin (CRESTOR) 40 MG tablet Take 1 tablet (40 mg total) by mouth daily. 90 tablet 3   traMADol (ULTRAM) 50 MG tablet Take 1 tablet (50 mg total) by mouth every 6 (six) hours as needed. 60 tablet 0   No current facility-administered medications for this visit.    ALLERGIES:  No Known Allergies  PHYSICAL EXAM:  Performance status (ECOG): 1 - Symptomatic but completely ambulatory  There were no vitals filed for this visit. Wt Readings from Last 3 Encounters:  10/08/20 222 lb (100.7 kg)  10/01/20 218 lb (98.9 kg)  09/24/20 216 lb 6.4 oz (98.2 kg)   Physical Exam Vitals reviewed.  Constitutional:      Appearance: Normal appearance.  Cardiovascular:     Rate and Rhythm: Normal rate and regular rhythm.     Pulses: Normal pulses.     Heart sounds: Normal heart sounds.  Pulmonary:     Effort: Pulmonary effort is normal.     Breath sounds: Normal breath sounds.  Neurological:     General: No focal deficit present.     Mental Status: He is alert and oriented to  person, place, and time.  Psychiatric:        Mood and Affect: Mood normal.        Behavior: Behavior normal.    LABORATORY DATA:  I have reviewed the labs as listed.  CBC Latest Ref Rng & Units 10/08/2020 10/01/2020 09/24/2020  WBC 4.0 - 10.5 K/uL 4.9 3.9(L) 3.6(L)  Hemoglobin 13.0 - 17.0 g/dL 12.2(L) 11.7(L) 12.2(L)  Hematocrit 39.0 - 52.0 % 37.8(L) 37.3(L) 38.4(L)  Platelets 150 - 400 K/uL 274 213 264   CMP Latest Ref Rng & Units 10/08/2020 10/01/2020 09/24/2020  Glucose 70 - 99 mg/dL 114(H) 138(H) 139(H)  BUN 8 - 23 mg/dL _0 Creatinine 0.61 - 1.24 mg/dL 0.98 1.05 1.05  Sodium 135 - 145 mmol/L 137 134(L) 138  Potassium 3.5 - 5.1 mmol/L 3.2(L) 3.8 3.7  Chloride 98 - 111 mmol/L 105 103 103  CO2 22 - 32 mmol/L _1 Calcium 8.9 - 10.3 mg/dL 8.1(L) 8.2(L) 8.8(L)  Total Protein 6.5 - 8.1 g/dL 7.2 7.3 7.8  Total Bilirubin 0.3 - 1.2 mg/dL 0.5 0.9 0.7  Alkaline Phos 38 - 126 U/L 94 91 103  AST 15 - 41 U/L _2 ALT 0 - 44 U/L _3 DIAGNOSTIC IMAGING:  I have independently reviewed the scans and discussed with the patient. No results found.   ASSESSMENT:  1.  Standard risk plasma cell myeloma: - He reported low back pain radiating to the right thigh since April, pain gets worse when he walks for more than 2 blocks. - He had history of MGUS and was last seen in our clinic in 2019.  He had an abnormal M spike of 3.2 g on 12/06/2018. - Skeletal survey on 10/22/2017 showed no suspicious lytic lesions. - X-ray of the right sided pelvis on 06/11/2020 showed prominent lytic lesions noted in the proximal midportion of the right femoral diaphysis with no evidence of fracture or dislocation. - Bone marrow biopsy on 07/09/2020 with hypercellular marrow with trilineage hematopoiesis.  Plasma cells representing 10% of cells. - Chromosome analysis 46, XY (20).  Multiple myeloma FISH panel negative. - PET scan on 07/04/2020 with  multiple hypermetabolic bony soft tissue lesions.   Dominant lesions in the L5 vertebral body and posterior right acetabulum and distal right femur.  Focal increased uptake in the right tonsillar region with associated soft tissue fullness on CT imaging. - 24-hour urine with total protein 134. - Beta-2 microglobulin 2.1 (06/26/2020), LDH 136. - Dara RVD cycle 1 started on 09/03/2020.  2.  Social/family history: - He works at [employer] in environmental services. - He was never smoker. - 2 brothers died of metastatic lung cancer.  Sister has thyroid cancer.  Mother had breast cancer.   PLAN:  1.  Stage I standard risk IgG kappa multiple myeloma: - He has completed 2 cycles of Dara VRD. - Reviewed myeloma panel from 10/08/2020. - M spike improved to 1 g from 2.9 g prior to start of therapy.  Free light chain ratio improved from 10.8-2.24.  Kappa light chains are 25.1. - He is tolerating Revlimid 25 mg 2 weeks on 1 week off very well. - LFTs and creatinine were normal.  CBC was grossly normal. - Proceed with cycle 3 today.  RTC 3 weeks for follow-up. - I have recommended that he talk to transplant specialist at [facility].  We will make a referral to Drs. Lambird/McKay.  2.  Right mid thigh pain: - Right femur ORIF trochanteric femoral IM nail on 08/09/2020. - Radiation therapy was recommended to the right femur by Dr. Ysidro Evert at [facility] therapy was not started as he started systemic therapy. - He does not report any pain requiring pain medication.  3.  Infection prophylaxis: - Continue acyclovir 400 mg twice daily. - Continue aspirin 81 mg for thromboprophylaxis.  4.  Myeloma bone disease: - Continue Zometa every 4 weeks. - Continue calcium supplements.   Orders placed this encounter:  No orders of the defined types were placed in this encounter.    Derek Jack, MD [place] [phone]   I, Thana Ates, am acting as a scribe for Dr. Derek Jack.  I, Derek Jack MD, have reviewed the above documentation for accuracy and completeness, and I agree with the above.

## 2020-10-15 ENCOUNTER — Inpatient Hospital Stay (HOSPITAL_COMMUNITY): Payer: 59 | Admitting: Hematology

## 2020-10-15 ENCOUNTER — Inpatient Hospital Stay (HOSPITAL_COMMUNITY): Payer: 59 | Attending: Hematology

## 2020-10-15 ENCOUNTER — Inpatient Hospital Stay (HOSPITAL_COMMUNITY): Payer: 59

## 2020-10-15 ENCOUNTER — Other Ambulatory Visit: Payer: Self-pay

## 2020-10-15 ENCOUNTER — Encounter (HOSPITAL_COMMUNITY): Payer: Self-pay | Admitting: Lab

## 2020-10-15 VITALS — BP 122/95 | HR 75 | Temp 96.7°F | Resp 18 | Wt 221.0 lb

## 2020-10-15 DIAGNOSIS — D472 Monoclonal gammopathy: Secondary | ICD-10-CM | POA: Diagnosis not present

## 2020-10-15 DIAGNOSIS — Z23 Encounter for immunization: Secondary | ICD-10-CM | POA: Diagnosis not present

## 2020-10-15 DIAGNOSIS — C9000 Multiple myeloma not having achieved remission: Secondary | ICD-10-CM | POA: Insufficient documentation

## 2020-10-15 DIAGNOSIS — M899 Disorder of bone, unspecified: Secondary | ICD-10-CM

## 2020-10-15 DIAGNOSIS — Z5112 Encounter for antineoplastic immunotherapy: Secondary | ICD-10-CM | POA: Insufficient documentation

## 2020-10-15 LAB — CBC WITH DIFFERENTIAL/PLATELET
Abs Immature Granulocytes: 0.01 10*3/uL (ref 0.00–0.07)
Basophils Absolute: 0 10*3/uL (ref 0.0–0.1)
Basophils Relative: 1 %
Eosinophils Absolute: 0.1 10*3/uL (ref 0.0–0.5)
Eosinophils Relative: 2 %
HCT: 38.7 % — ABNORMAL LOW (ref 39.0–52.0)
Hemoglobin: 12.2 g/dL — ABNORMAL LOW (ref 13.0–17.0)
Immature Granulocytes: 0 %
Lymphocytes Relative: 51 %
Lymphs Abs: 2.7 10*3/uL (ref 0.7–4.0)
MCH: 31.3 pg (ref 26.0–34.0)
MCHC: 31.5 g/dL (ref 30.0–36.0)
MCV: 99.2 fL (ref 80.0–100.0)
Monocytes Absolute: 0.7 10*3/uL (ref 0.1–1.0)
Monocytes Relative: 13 %
Neutro Abs: 1.8 10*3/uL (ref 1.7–7.7)
Neutrophils Relative %: 33 %
Platelets: 331 10*3/uL (ref 150–400)
RBC: 3.9 MIL/uL — ABNORMAL LOW (ref 4.22–5.81)
RDW: 13.9 % (ref 11.5–15.5)
WBC: 5.3 10*3/uL (ref 4.0–10.5)
nRBC: 0 % (ref 0.0–0.2)

## 2020-10-15 LAB — COMPREHENSIVE METABOLIC PANEL
ALT: 20 U/L (ref 0–44)
AST: 21 U/L (ref 15–41)
Albumin: 3.9 g/dL (ref 3.5–5.0)
Alkaline Phosphatase: 91 U/L (ref 38–126)
Anion gap: 7 (ref 5–15)
BUN: 14 mg/dL (ref 8–23)
CO2: 26 mmol/L (ref 22–32)
Calcium: 8.7 mg/dL — ABNORMAL LOW (ref 8.9–10.3)
Chloride: 101 mmol/L (ref 98–111)
Creatinine, Ser: 0.92 mg/dL (ref 0.61–1.24)
GFR, Estimated: >60 mL/min (ref >60)
Glucose, Bld: 135 mg/dL — ABNORMAL HIGH (ref 70–99)
Potassium: 3.7 mmol/L (ref 3.5–5.1)
Sodium: 134 mmol/L — ABNORMAL LOW (ref 135–145)
Total Bilirubin: 0.8 mg/dL (ref 0.3–1.2)
Total Protein: 7.5 g/dL (ref 6.5–8.1)

## 2020-10-15 LAB — MAGNESIUM: Magnesium: 1.9 mg/dL (ref 1.7–2.4)

## 2020-10-15 MED ORDER — BORTEZOMIB CHEMO SQ INJECTION 3.5 MG (2.5MG/ML)
1.3000 mg/m2 | Freq: Once | INTRAMUSCULAR | Status: AC
  Administered 2020-10-15: 2.75 mg via SUBCUTANEOUS
  Filled 2020-10-15: qty 1.1

## 2020-10-15 MED ORDER — ACETAMINOPHEN 325 MG PO TABS
650.0000 mg | ORAL_TABLET | Freq: Once | ORAL | Status: AC
  Administered 2020-10-15: 650 mg via ORAL
  Filled 2020-10-15: qty 2

## 2020-10-15 MED ORDER — DARATUMUMAB-HYALURONIDASE-FIHJ 1800-30000 MG-UT/15ML ~~LOC~~ SOLN
1800.0000 mg | Freq: Once | SUBCUTANEOUS | Status: AC
  Administered 2020-10-15: 1800 mg via SUBCUTANEOUS
  Filled 2020-10-15: qty 15

## 2020-10-15 MED ORDER — DEXAMETHASONE 4 MG PO TABS
40.0000 mg | ORAL_TABLET | Freq: Once | ORAL | Status: AC
  Administered 2020-10-15: 40 mg via ORAL
  Filled 2020-10-15: qty 10

## 2020-10-15 MED ORDER — DIPHENHYDRAMINE HCL 25 MG PO CAPS
25.0000 mg | ORAL_CAPSULE | Freq: Once | ORAL | Status: AC
  Administered 2020-10-15: 25 mg via ORAL
  Filled 2020-10-15: qty 1

## 2020-10-15 MED ORDER — INFLUENZA VAC SPLIT QUAD 0.5 ML IM SUSY
0.5000 mL | PREFILLED_SYRINGE | Freq: Once | INTRAMUSCULAR | Status: AC
  Administered 2020-10-15: 0.5 mL via INTRAMUSCULAR
  Filled 2020-10-15: qty 0.5

## 2020-10-15 NOTE — Progress Notes (Signed)
Chaplain engaged in a follow-up with Eugene [name].  Eugene [name] voiced that he was doing well and that is family was doing well too.  He talked about a number of his family members having upcoming birthdays in the coming months.    Chaplain continues to offer support, presence, and listening.    10/15/20 1100  Clinical Encounter Type  Visited With Patient  Visit Type Follow-up

## 2020-10-15 NOTE — Progress Notes (Signed)
Patient presents today for chemotherapy injections.  Patient is in satisfactory condition with no new complaints voiced.  Vital signs are stable.  Labs reviewed by Dr. Delton Coombes during his office visit.  We will proceed with treatment per MD orders.    Patient tolerated treatment well with no complaints voiced.  Patient left ambulatory in stable condition.  Vital signs stable at discharge.  Follow up as scheduled.

## 2020-10-15 NOTE — Progress Notes (Signed)
Patient is taking Revlimid as prescribed.  They have not missed any doses and report no side effects at this time.    Patient has been examined, vital signs and labs have been reviewed by Dr. Delton Coombes. ANC, Creatinine, LFTs, hemoglobin, and platelets are within treatment parameters per Dr. Delton Coombes. Patient may proceed with treatment per M.D.

## 2020-10-15 NOTE — Progress Notes (Unsigned)
Referral to [facility] Dr Aris Lot called on 10/4  office to call pt with appt

## 2020-10-15 NOTE — Patient Instructions (Signed)
[facility]  Discharge Instructions: Thank you for choosing [facility] to provide your oncology and hematology care.  If you have a lab appointment with the Cancer Center, please come in thru the Main Entrance and check in at the main information desk.  Wear comfortable clothing and clothing appropriate for easy access to any Portacath or PICC line.   We strive to give you quality time with your provider. You may need to reschedule your appointment if you arrive late (15 or more minutes).  Arriving late affects you and other patients whose appointments are after yours.  Also, if you miss three or more appointments without notifying the office, you may be dismissed from the clinic at the provider's discretion.      For prescription refill requests, have your pharmacy contact our office and allow 72 hours for refills to be completed.        To help prevent nausea and vomiting after your treatment, we encourage you to take your nausea medication as directed.  BELOW ARE SYMPTOMS THAT SHOULD BE REPORTED IMMEDIATELY: *FEVER GREATER THAN 100.4 F (38 C) OR HIGHER *CHILLS OR SWEATING *NAUSEA AND VOMITING THAT IS NOT CONTROLLED WITH YOUR NAUSEA MEDICATION *UNUSUAL SHORTNESS OF BREATH *UNUSUAL BRUISING OR BLEEDING *URINARY PROBLEMS (pain or burning when urinating, or frequent urination) *BOWEL PROBLEMS (unusual diarrhea, constipation, pain near the anus) TENDERNESS IN MOUTH AND THROAT WITH OR WITHOUT PRESENCE OF ULCERS (sore throat, sores in mouth, or a toothache) UNUSUAL RASH, SWELLING OR PAIN  UNUSUAL VAGINAL DISCHARGE OR ITCHING   Items with * indicate a potential emergency and should be followed up as soon as possible or go to the Emergency Department if any problems should occur.  Please show the CHEMOTHERAPY ALERT CARD or IMMUNOTHERAPY ALERT CARD at check-in to the Emergency Department and triage nurse.  Should you have questions after your visit or need to cancel  or reschedule your appointment, please contact [facility] [phone]  and follow the prompts.  Office hours are 8:00 a.m. to 4:30 p.m. Monday - Friday. Please note that voicemails left after 4:00 p.m. may not be returned until the following business day.  We are closed weekends and major holidays. You have access to a nurse at all times for urgent questions. Please call the main number to the clinic [phone] and follow the prompts.  For any non-urgent questions, you may also contact your provider using MyChart. We now offer e-Visits for anyone 18 and older to request care online for non-urgent symptoms. For details visit [email or web address].   Also download the MyChart app! Go to the app store, search "MyChart", open the app, select [facility], and log in with your MyChart username and password.  Due to Covid, a mask is required upon entering the hospital/clinic. If you do not have a mask, one will be given to you upon arrival. For doctor visits, patients may have 1 support person aged 18 or older with them. For treatment visits, patients cannot have anyone with them due to current Covid guidelines and our immunocompromised population.  

## 2020-10-15 NOTE — Patient Instructions (Addendum)
[place] at [facility] Discharge Instructions  You were seen today by Dr. Delton Coombes. He went over your recent results, and you received your treatment. You will be referred to [facility] for a consultation concerning a bone marrow transplant. Dr. Delton Coombes will see you back in 3 weeks for labs and follow up.   Thank you for choosing [place] at [facility] to provide your oncology and hematology care.  To afford each patient quality time with our provider, please arrive at least 15 minutes before your scheduled appointment time.   If you have a lab appointment with the [facility] please come in thru the Main Entrance and check in at the main information desk  You need to re-schedule your appointment should you arrive 10 or more minutes late.  We strive to give you quality time with our providers, and arriving late affects you and other patients whose appointments are after yours.  Also, if you no show three or more times for appointments you may be dismissed from the clinic at the providers discretion.     Again, thank you for choosing [facility].  Our hope is that these requests will decrease the amount of time that you wait before being seen by our physicians.       _____________________________________________________________  Should you have questions after your visit to [facility], please contact our office at [phone] between the hours of 8:00 a.m. and 4:30 p.m.  Voicemails left after 4:00 p.m. will not be returned until the following business day.  For prescription refill requests, have your pharmacy contact our office and allow 72 hours.    Cancer Center Support Programs:   > Cancer Support Group  2nd Tuesday of the month 1pm-2pm, Journey Room

## 2020-10-21 ENCOUNTER — Other Ambulatory Visit (HOSPITAL_COMMUNITY): Payer: Self-pay

## 2020-10-22 ENCOUNTER — Other Ambulatory Visit: Payer: Self-pay

## 2020-10-22 ENCOUNTER — Inpatient Hospital Stay (HOSPITAL_COMMUNITY): Payer: 59

## 2020-10-22 VITALS — BP 115/70 | HR 76 | Temp 96.5°F | Resp 18 | Wt 220.8 lb

## 2020-10-22 DIAGNOSIS — Z23 Encounter for immunization: Secondary | ICD-10-CM | POA: Diagnosis not present

## 2020-10-22 DIAGNOSIS — Z5112 Encounter for antineoplastic immunotherapy: Secondary | ICD-10-CM | POA: Diagnosis not present

## 2020-10-22 DIAGNOSIS — C9000 Multiple myeloma not having achieved remission: Secondary | ICD-10-CM

## 2020-10-22 LAB — CBC WITH DIFFERENTIAL/PLATELET
Abs Immature Granulocytes: 0.03 10*3/uL (ref 0.00–0.07)
Basophils Absolute: 0 10*3/uL (ref 0.0–0.1)
Basophils Relative: 1 %
Eosinophils Absolute: 0.6 10*3/uL — ABNORMAL HIGH (ref 0.0–0.5)
Eosinophils Relative: 12 %
HCT: 37.5 % — ABNORMAL LOW (ref 39.0–52.0)
Hemoglobin: 12 g/dL — ABNORMAL LOW (ref 13.0–17.0)
Immature Granulocytes: 1 %
Lymphocytes Relative: 49 %
Lymphs Abs: 2.4 10*3/uL (ref 0.7–4.0)
MCH: 31.5 pg (ref 26.0–34.0)
MCHC: 32 g/dL (ref 30.0–36.0)
MCV: 98.4 fL (ref 80.0–100.0)
Monocytes Absolute: 0.3 10*3/uL (ref 0.1–1.0)
Monocytes Relative: 7 %
Neutro Abs: 1.5 10*3/uL — ABNORMAL LOW (ref 1.7–7.7)
Neutrophils Relative %: 30 %
Platelets: 222 10*3/uL (ref 150–400)
RBC: 3.81 MIL/uL — ABNORMAL LOW (ref 4.22–5.81)
RDW: 14.2 % (ref 11.5–15.5)
WBC: 4.9 10*3/uL (ref 4.0–10.5)
nRBC: 0 % (ref 0.0–0.2)

## 2020-10-22 LAB — COMPREHENSIVE METABOLIC PANEL
ALT: 18 U/L (ref 0–44)
AST: 18 U/L (ref 15–41)
Albumin: 3.8 g/dL (ref 3.5–5.0)
Alkaline Phosphatase: 81 U/L (ref 38–126)
Anion gap: 6 (ref 5–15)
BUN: 11 mg/dL (ref 8–23)
CO2: 28 mmol/L (ref 22–32)
Calcium: 8.7 mg/dL — ABNORMAL LOW (ref 8.9–10.3)
Chloride: 103 mmol/L (ref 98–111)
Creatinine, Ser: 1.01 mg/dL (ref 0.61–1.24)
GFR, Estimated: >60 mL/min (ref >60)
Glucose, Bld: 153 mg/dL — ABNORMAL HIGH (ref 70–99)
Potassium: 3.5 mmol/L (ref 3.5–5.1)
Sodium: 137 mmol/L (ref 135–145)
Total Bilirubin: 0.8 mg/dL (ref 0.3–1.2)
Total Protein: 7 g/dL (ref 6.5–8.1)

## 2020-10-22 LAB — MAGNESIUM: Magnesium: 1.8 mg/dL (ref 1.7–2.4)

## 2020-10-22 MED ORDER — DIPHENHYDRAMINE HCL 25 MG PO CAPS
25.0000 mg | ORAL_CAPSULE | Freq: Once | ORAL | Status: AC
  Administered 2020-10-22: 25 mg via ORAL
  Filled 2020-10-22: qty 1

## 2020-10-22 MED ORDER — DARATUMUMAB-HYALURONIDASE-FIHJ 1800-30000 MG-UT/15ML ~~LOC~~ SOLN
1800.0000 mg | Freq: Once | SUBCUTANEOUS | Status: AC
  Administered 2020-10-22: 1800 mg via SUBCUTANEOUS
  Filled 2020-10-22: qty 15

## 2020-10-22 MED ORDER — ACETAMINOPHEN 325 MG PO TABS
650.0000 mg | ORAL_TABLET | Freq: Once | ORAL | Status: AC
  Administered 2020-10-22: 650 mg via ORAL
  Filled 2020-10-22: qty 2

## 2020-10-22 MED ORDER — BORTEZOMIB CHEMO SQ INJECTION 3.5 MG (2.5MG/ML)
1.3000 mg/m2 | Freq: Once | INTRAMUSCULAR | Status: AC
  Administered 2020-10-22: 2.75 mg via SUBCUTANEOUS
  Filled 2020-10-22: qty 1.1

## 2020-10-22 MED ORDER — DEXAMETHASONE 4 MG PO TABS
40.0000 mg | ORAL_TABLET | Freq: Once | ORAL | Status: AC
  Administered 2020-10-22: 40 mg via ORAL
  Filled 2020-10-22: qty 10

## 2020-10-22 NOTE — Progress Notes (Signed)
Pt presents today for Dara [place] and Velcade injection per provider's order. Vital signs and labs WNL for treatment today. Pt voiced no new complaints at this time.  Dara [place] and Velcade injectiongiven today per MD orders. Tolerated infusion without adverse affects. Vital signs stable. No complaints at this time. Discharged from clinic ambulatory in stable condition. Alert and oriented x 3. F/U with [facility] as scheduled.

## 2020-10-22 NOTE — Patient Instructions (Signed)
Eugene [name]  Discharge Instructions: Thank you for choosing [facility] to provide your oncology and hematology care.  If you have a lab appointment with the [facility], please come in thru the Main Entrance and check in at the main information desk.  Wear comfortable clothing and clothing appropriate for easy access to any Portacath or PICC line.   We strive to give you quality time with your provider. You may need to reschedule your appointment if you arrive late (15 or more minutes).  Arriving late affects you and other patients whose appointments are after yours.  Also, if you miss three or more appointments without notifying the office, you may be dismissed from the clinic at the provider's discretion.      For prescription refill requests, have your pharmacy contact our office and allow 72 hours for refills to be completed.    Today you received the following chemotherapy and/or immunotherapy agents Dara Anthoston and Velcade injection.   To help prevent nausea and vomiting after your treatment, we encourage you to take your nausea medication as directed.  BELOW ARE SYMPTOMS THAT SHOULD BE REPORTED IMMEDIATELY: *FEVER GREATER THAN 100.4 F (38 C) OR HIGHER *CHILLS OR SWEATING *NAUSEA AND VOMITING THAT IS NOT CONTROLLED WITH YOUR NAUSEA MEDICATION *UNUSUAL SHORTNESS OF BREATH *UNUSUAL BRUISING OR BLEEDING *URINARY PROBLEMS (pain or burning when urinating, or frequent urination) *BOWEL PROBLEMS (unusual diarrhea, constipation, pain near the anus) TENDERNESS IN MOUTH AND THROAT WITH OR WITHOUT PRESENCE OF ULCERS (sore throat, sores in mouth, or a toothache) UNUSUAL RASH, SWELLING OR PAIN  UNUSUAL VAGINAL DISCHARGE OR ITCHING   Items with * indicate a potential emergency and should be followed up as soon as possible or go to the Emergency Department if any problems should occur.  Please show the CHEMOTHERAPY ALERT CARD or IMMUNOTHERAPY ALERT CARD at check-in to  the Emergency Department and triage nurse.  Should you have questions after your visit or need to cancel or reschedule your appointment, please contact [facility] [phone]  and follow the prompts.  Office hours are 8:00 a.m. to 4:30 p.m. Monday - Friday. Please note that voicemails left after 4:00 p.m. may not be returned until the following business day.  We are closed weekends and major holidays. You have access to a nurse at all times for urgent questions. Please call the main number to the clinic [phone] and follow the prompts.  For any non-urgent questions, you may also contact your provider using MyChart. We now offer e-Visits for anyone 49 and older to request care online for non-urgent symptoms. For details visit [email or web address].   Also download the MyChart app! Go to the app store, search "MyChart", open the app, select [facility], and log in with your MyChart username and password.  Due to Covid, a mask is required upon entering the hospital/clinic. If you do not have a mask, one will be given to you upon arrival. For doctor visits, patients may have 1 support person aged 8 or older with them. For treatment visits, patients cannot have anyone with them due to current Covid guidelines and our immunocompromised population.

## 2020-10-29 ENCOUNTER — Other Ambulatory Visit: Payer: Self-pay

## 2020-10-29 ENCOUNTER — Other Ambulatory Visit (HOSPITAL_COMMUNITY): Payer: Self-pay | Admitting: Hematology

## 2020-10-29 ENCOUNTER — Encounter (HOSPITAL_COMMUNITY): Payer: Self-pay

## 2020-10-29 ENCOUNTER — Inpatient Hospital Stay (HOSPITAL_COMMUNITY): Payer: 59

## 2020-10-29 VITALS — BP 158/83 | HR 77 | Temp 98.6°F | Resp 18 | Wt 223.6 lb

## 2020-10-29 DIAGNOSIS — C9000 Multiple myeloma not having achieved remission: Secondary | ICD-10-CM

## 2020-10-29 DIAGNOSIS — Z5112 Encounter for antineoplastic immunotherapy: Secondary | ICD-10-CM | POA: Diagnosis not present

## 2020-10-29 DIAGNOSIS — Z01818 Encounter for other preprocedural examination: Secondary | ICD-10-CM | POA: Diagnosis not present

## 2020-10-29 DIAGNOSIS — Z23 Encounter for immunization: Secondary | ICD-10-CM | POA: Diagnosis not present

## 2020-10-29 LAB — CBC WITH DIFFERENTIAL/PLATELET
Abs Immature Granulocytes: 0.03 10*3/uL (ref 0.00–0.07)
Basophils Absolute: 0 10*3/uL (ref 0.0–0.1)
Basophils Relative: 1 %
Eosinophils Absolute: 0.2 10*3/uL (ref 0.0–0.5)
Eosinophils Relative: 4 %
HCT: 40.1 % (ref 39.0–52.0)
Hemoglobin: 12.8 g/dL — ABNORMAL LOW (ref 13.0–17.0)
Immature Granulocytes: 1 %
Lymphocytes Relative: 39 %
Lymphs Abs: 2.4 10*3/uL (ref 0.7–4.0)
MCH: 31.6 pg (ref 26.0–34.0)
MCHC: 31.9 g/dL (ref 30.0–36.0)
MCV: 99 fL (ref 80.0–100.0)
Monocytes Absolute: 0.5 10*3/uL (ref 0.1–1.0)
Monocytes Relative: 9 %
Neutro Abs: 3 10*3/uL (ref 1.7–7.7)
Neutrophils Relative %: 46 %
Platelets: 206 10*3/uL (ref 150–400)
RBC: 4.05 MIL/uL — ABNORMAL LOW (ref 4.22–5.81)
RDW: 14.2 % (ref 11.5–15.5)
WBC: 6.2 10*3/uL (ref 4.0–10.5)
nRBC: 0 % (ref 0.0–0.2)

## 2020-10-29 LAB — COMPREHENSIVE METABOLIC PANEL
ALT: 19 U/L (ref 0–44)
AST: 23 U/L (ref 15–41)
Albumin: 3.9 g/dL (ref 3.5–5.0)
Alkaline Phosphatase: 80 U/L (ref 38–126)
Anion gap: 8 (ref 5–15)
BUN: 15 mg/dL (ref 8–23)
CO2: 25 mmol/L (ref 22–32)
Calcium: 8.8 mg/dL — ABNORMAL LOW (ref 8.9–10.3)
Chloride: 102 mmol/L (ref 98–111)
Creatinine, Ser: 1.03 mg/dL (ref 0.61–1.24)
GFR, Estimated: >60 mL/min (ref >60)
Glucose, Bld: 155 mg/dL — ABNORMAL HIGH (ref 70–99)
Potassium: 3.4 mmol/L — ABNORMAL LOW (ref 3.5–5.1)
Sodium: 135 mmol/L (ref 135–145)
Total Bilirubin: 0.9 mg/dL (ref 0.3–1.2)
Total Protein: 7.2 g/dL (ref 6.5–8.1)

## 2020-10-29 LAB — MAGNESIUM: Magnesium: 1.7 mg/dL (ref 1.7–2.4)

## 2020-10-29 LAB — LACTATE DEHYDROGENASE: LDH: 116 U/L (ref 98–192)

## 2020-10-29 MED ORDER — POTASSIUM CHLORIDE CRYS ER 20 MEQ PO TBCR
40.0000 meq | EXTENDED_RELEASE_TABLET | Freq: Once | ORAL | Status: AC
  Administered 2020-10-29: 40 meq via ORAL
  Filled 2020-10-29: qty 2

## 2020-10-29 MED ORDER — DARATUMUMAB-HYALURONIDASE-FIHJ 1800-30000 MG-UT/15ML ~~LOC~~ SOLN
1800.0000 mg | Freq: Once | SUBCUTANEOUS | Status: AC
  Administered 2020-10-29: 1800 mg via SUBCUTANEOUS
  Filled 2020-10-29: qty 15

## 2020-10-29 MED ORDER — BORTEZOMIB CHEMO SQ INJECTION 3.5 MG (2.5MG/ML)
1.3000 mg/m2 | Freq: Once | INTRAMUSCULAR | Status: AC
  Administered 2020-10-29: 2.75 mg via SUBCUTANEOUS
  Filled 2020-10-29: qty 1.1

## 2020-10-29 MED ORDER — DEXAMETHASONE 4 MG PO TABS
40.0000 mg | ORAL_TABLET | Freq: Once | ORAL | Status: AC
  Administered 2020-10-29: 40 mg via ORAL
  Filled 2020-10-29: qty 10

## 2020-10-29 MED ORDER — ACETAMINOPHEN 325 MG PO TABS
650.0000 mg | ORAL_TABLET | Freq: Once | ORAL | Status: AC
  Administered 2020-10-29: 650 mg via ORAL
  Filled 2020-10-29: qty 2

## 2020-10-29 MED ORDER — DIPHENHYDRAMINE HCL 25 MG PO CAPS
25.0000 mg | ORAL_CAPSULE | Freq: Once | ORAL | Status: AC
  Administered 2020-10-29: 25 mg via ORAL
  Filled 2020-10-29: qty 1

## 2020-10-29 NOTE — Progress Notes (Signed)
Eugene [name] and Velcade injection given today per MD orders. Tolerated infusion without adverse affects. Vital signs stable. No complaints at this time. Discharged from clinic ambulatory in stable condition. Alert and oriented x 3. F/U with [facility] as scheduled.

## 2020-10-29 NOTE — Patient Instructions (Signed)
[facility]  Discharge Instructions: Thank you for choosing [facility] to provide your oncology and hematology care.  If you have a lab appointment with the [facility], please come in thru the Main Entrance and check in at the main information desk.  Wear comfortable clothing and clothing appropriate for easy access to any Portacath or PICC line.   We strive to give you quality time with your provider. You may need to reschedule your appointment if you arrive late (15 or more minutes).  Arriving late affects you and other patients whose appointments are after yours.  Also, if you miss three or more appointments without notifying the office, you may be dismissed from the clinic at the provider's discretion.      For prescription refill requests, have your pharmacy contact our office and allow 72 hours for refills to be completed.    Today you received the following chemotherapy and/or immunotherapy agents dara and velcade       To help prevent nausea and vomiting after your treatment, we encourage you to take your nausea medication as directed.  BELOW ARE SYMPTOMS THAT SHOULD BE REPORTED IMMEDIATELY: *FEVER GREATER THAN 100.4 F (38 C) OR HIGHER *CHILLS OR SWEATING *NAUSEA AND VOMITING THAT IS NOT CONTROLLED WITH YOUR NAUSEA MEDICATION *UNUSUAL SHORTNESS OF BREATH *UNUSUAL BRUISING OR BLEEDING *URINARY PROBLEMS (pain or burning when urinating, or frequent urination) *BOWEL PROBLEMS (unusual diarrhea, constipation, pain near the anus) TENDERNESS IN MOUTH AND THROAT WITH OR WITHOUT PRESENCE OF ULCERS (sore throat, sores in mouth, or a toothache) UNUSUAL RASH, SWELLING OR PAIN  UNUSUAL VAGINAL DISCHARGE OR ITCHING   Items with * indicate a potential emergency and should be followed up as soon as possible or go to the Emergency Department if any problems should occur.  Please show the CHEMOTHERAPY ALERT CARD or IMMUNOTHERAPY ALERT CARD at check-in to the  Emergency Department and triage nurse.  Should you have questions after your visit or need to cancel or reschedule your appointment, please contact [facility] [phone]  and follow the prompts.  Office hours are 8:00 a.m. to 4:30 p.m. Monday - Friday. Please note that voicemails left after 4:00 p.m. may not be returned until the following business day.  We are closed weekends and major holidays. You have access to a nurse at all times for urgent questions. Please call the main number to the clinic [phone] and follow the prompts.  For any non-urgent questions, you may also contact your provider using MyChart. We now offer e-Visits for anyone 31 and older to request care online for non-urgent symptoms. For details visit [email or web address].   Also download the MyChart app! Go to the app store, search "MyChart", open the app, select [facility], and log in with your MyChart username and password.  Due to Covid, a mask is required upon entering the hospital/clinic. If you do not have a mask, one will be given to you upon arrival. For doctor visits, patients may have 1 support person aged 29 or older with them. For treatment visits, patients cannot have anyone with them due to current Covid guidelines and our immunocompromised population.   Daratumumab; Hyaluronidase Injection What is this medication? DARATUMUMAB; HYALURONIDASE (dar a toom ue mab / hye al ur ON i dase) is a monoclonal antibody. Hyaluronidase is used to improve the effects of daratumumab. It treats certain types of cancer. Some of the cancers treated are multiple myeloma and light-chain amyloidosis. This medicine may be  used for other purposes; ask your health care provider or pharmacist if you have questions. COMMON BRAND NAME(S): DARZALEX FASPRO What should I tell my care team before I take this medication? They need to know if you have any of these conditions: heart disease infection especially a viral  infection such as chickenpox, cold sores, herpes, or hepatitis B lung or breathing disease an unusual or allergic reaction to daratumumab, hyaluronidase, other medicines, foods, dyes, or preservatives pregnant or trying to get pregnant breast-feeding How should I use this medication? This medicine is for injection under the skin. It is given by a health care professional in a hospital or clinic setting. Talk to your pediatrician regarding the use of this medicine in children. Special care may be needed. Overdosage: If you think you have taken too much of this medicine contact a poison control center or emergency room at once. NOTE: This medicine is only for you. Do not share this medicine with others. What if I miss a dose? Keep appointments for follow-up doses as directed. It is important not to miss your dose. Call your doctor or health care professional if you are unable to keep an appointment. What may interact with this medication? Interactions have not been studied. This list may not describe all possible interactions. Give your health care provider a list of all the medicines, herbs, non-prescription drugs, or dietary supplements you use. Also tell them if you smoke, drink alcohol, or use illegal drugs. Some items may interact with your medicine. What should I watch for while using this medication? Your condition will be monitored carefully while you are receiving this medicine. This medicine can cause serious allergic reactions. To reduce your risk, your health care provider may give you other medicine to take before receiving this one. Be sure to follow the directions from your health care provider. This medicine can affect the results of blood tests to match your blood type. These changes can last for up to 6 months after the final dose. Your healthcare provider will do blood tests to match your blood type before you start treatment. Tell all of your healthcare providers that you are  being treated with this medicine before receiving a blood transfusion. This medicine can affect the results of some tests used to determine treatment response; extra tests may be needed to evaluate response. Do not become pregnant while taking this medicine or for 3 months after stopping it. Women should inform their health care provider if they wish to become pregnant or think they might be pregnant. There is a potential for serious side effects to an unborn child. Talk to your health care provider for more information. Do not breast-feed an infant while taking this medicine. What side effects may I notice from receiving this medication? Side effects that you should report to your doctor or health care professional as soon as possible: allergic reactions like skin rash, itching or hives, swelling of the face, lips, or tongue blurred vision breathing problems chest pain cough dizziness fast heartbeat feeling faint or lightheaded headache low blood counts - this medicine may decrease the number of white blood cells, red blood cells and platelets. You may be at increased risk for infections and bleeding. nausea, vomiting shortness of breath signs of decreased platelets or bleeding - bruising, pinpoint red spots on the skin, black, tarry stools, blood in the urine signs of decreased red blood cells - unusually weak or tired, feeling faint or lightheaded, falls signs of infection - fever or  chills, cough, sore throat, pain or difficulty passing urine Side effects that usually do not require medical attention (report these to your doctor or health care professional if they continue or are bothersome): back pain constipation diarrhea pain, tingling, numbness in the hands or feet muscle cramps swelling of the ankles, feet, hands tiredness trouble sleeping This list may not describe all possible side effects. Call your doctor for medical advice about side effects. You may report side effects to  FDA at 1-800-FDA-1088. Where should I keep my medication? This drug is given in a hospital or clinic and will not be stored at home. NOTE: This sheet is a summary. It may not cover all possible information. If you have questions about this medicine, talk to your doctor, pharmacist, or health care provider.  2022 Elsevier/Gold Standard (2020-02-08 12:51:54)   Bortezomib injection What is this medication? BORTEZOMIB (bor TEZ oh mib) targets proteins in cancer cells and stops the cancer cells from growing. It treats multiple myeloma and mantle cell lymphoma. This medicine may be used for other purposes; ask your health care provider or pharmacist if you have questions. COMMON BRAND NAME(S): Velcade What should I tell my care team before I take this medication? They need to know if you have any of these conditions: dehydration diabetes (high blood sugar) heart disease liver disease tingling of the fingers or toes or other nerve disorder an unusual or allergic reaction to bortezomib, mannitol, boron, other medicines, foods, dyes, or preservatives pregnant or trying to get pregnant breast-feeding How should I use this medication? This medicine is injected into a vein or under the skin. It is given by a health care provider in a hospital or clinic setting. Talk to your health care provider about the use of this medicine in children. Special care may be needed. Overdosage: If you think you have taken too much of this medicine contact a poison control center or emergency room at once. NOTE: This medicine is only for you. Do not share this medicine with others. What if I miss a dose? Keep appointments for follow-up doses. It is important not to miss your dose. Call your health care provider if you are unable to keep an appointment. What may interact with this medication? This medicine may interact with the following medications: ketoconazole rifampin This list may not describe all possible  interactions. Give your health care provider a list of all the medicines, herbs, non-prescription drugs, or dietary supplements you use. Also tell them if you smoke, drink alcohol, or use illegal drugs. Some items may interact with your medicine. What should I watch for while using this medication? Your condition will be monitored carefully while you are receiving this medicine. You may need blood work done while you are taking this medicine. You may get drowsy or dizzy. Do not drive, use machinery, or do anything that needs mental alertness until you know how this medicine affects you. Do not stand up or sit up quickly, especially if you are an older patient. This reduces the risk of dizzy or fainting spells This medicine may increase your risk of getting an infection. Call your health care provider for advice if you get a fever, chills, sore throat, or other symptoms of a cold or flu. Do not treat yourself. Try to avoid being around people who are sick. Check with your health care provider if you have severe diarrhea, nausea, and vomiting, or if you sweat a lot. The loss of too much body fluid may make  it dangerous for you to take this medicine. Do not become pregnant while taking this medicine or for 7 months after stopping it. Women should inform their health care provider if they wish to become pregnant or think they might be pregnant. Men should not father a child while taking this medicine and for 4 months after stopping it. There is a potential for serious harm to an unborn child. Talk to your health care provider for more information. Do not breast-feed an infant while taking this medicine or for 2 months after stopping it. This medicine may make it more difficult to get pregnant or father a child. Talk to your health care provider if you are concerned about your fertility. What side effects may I notice from receiving this medication? Side effects that you should report to your doctor or health  care professional as soon as possible: allergic reactions (skin rash; itching or hives; swelling of the face, lips, or tongue) bleeding (bloody or black, tarry stools; red or dark brown urine; spitting up blood or brown material that looks like coffee grounds; red spots on the skin; unusual bruising or bleeding from the eye, gums, or nose) blurred vision or changes in vision confusion constipation headache heart failure (trouble breathing; fast, irregular heartbeat; sudden weight gain; swelling of the ankles, feet, hands) infection (fever, chills, cough, sore throat, pain or trouble passing urine) lack or loss of appetite liver injury (dark yellow or brown urine; general ill feeling or flu-like symptoms; loss of appetite, right upper belly pain; yellowing of the eyes or skin) low blood pressure (dizziness; feeling faint or lightheaded, falls; unusually weak or tired) muscle cramps pain, redness, or irritation at site where injected pain, tingling, numbness in the hands or feet seizures trouble breathing unusual bruising or bleeding Side effects that usually do not require medical attention (report to your doctor or health care professional if they continue or are bothersome): diarrhea nausea stomach pain trouble sleeping vomiting This list may not describe all possible side effects. Call your doctor for medical advice about side effects. You may report side effects to FDA at 1-800-FDA-1088. Where should I keep my medication? This medicine is given in a hospital or clinic. It will not be stored at home. NOTE: This sheet is a summary. It may not cover all possible information. If you have questions about this medicine, talk to your doctor, pharmacist, or health care provider.  2022 Elsevier/Gold Standard (2019-12-21 13:22:53)

## 2020-10-29 NOTE — Progress Notes (Signed)
Eugene [name] here for dara and velcade.  Potassium 3.4.  Potassium 40 meq PO given per standing orders from Dr Raliegh Ip.  Other labs and vital signs WNL for treatment today.

## 2020-10-29 NOTE — Telephone Encounter (Signed)
Chart reviewed. Revlimid refilled per last office note with Dr. Katragadda.  

## 2020-10-30 LAB — KAPPA/LAMBDA LIGHT CHAINS
Kappa free light chain: 27.5 mg/L — ABNORMAL HIGH (ref 3.3–19.4)
Kappa, lambda light chain ratio: 2.64 — ABNORMAL HIGH (ref 0.26–1.65)
Lambda free light chains: 10.4 mg/L (ref 5.7–26.3)

## 2020-11-01 LAB — PROTEIN ELECTROPHORESIS, SERUM
A/G Ratio: 1.2 (ref 0.7–1.7)
Albumin ELP: 3.6 g/dL (ref 2.9–4.4)
Alpha-1-Globulin: 0.2 g/dL (ref 0.0–0.4)
Alpha-2-Globulin: 0.7 g/dL (ref 0.4–1.0)
Beta Globulin: 1 g/dL (ref 0.7–1.3)
Gamma Globulin: 1.1 g/dL (ref 0.4–1.8)
Globulin, Total: 3.1 g/dL (ref 2.2–3.9)
M-Spike, %: 0.5 g/dL — ABNORMAL HIGH
Total Protein ELP: 6.7 g/dL (ref 6.0–8.5)

## 2020-11-04 NOTE — Progress Notes (Signed)
[place], [place] [address]   CLINIC:  Medical Oncology/Hematology  PCP:  Eugene Spar, MD [address] / [place] [address] [phone]   REASON FOR VISIT:  Follow-up for multiple myeloma  PRIOR THERAPY: none  NGS Results: not done  CURRENT THERAPY: DaraVRd every 3 weeks x 6 cycles  BRIEF ONCOLOGIC HISTORY:  Oncology History  Multiple myeloma without remission ([place])  07/09/2020 Pathology Results   BONE MARROW, ASPIRATE, CLOT, CORE:  -Hypercellular bone marrow with plasma cell neoplasm  -See comment   PERIPHERAL BLOOD:  -Slight macrocytic anemia   COMMENT:   The bone marrow is hypercellular for age with trilineage hematopoiesis and nonspecific myeloid changes.  In this background, the plasma cells are increased in number representing 10% of all cells associated with atypical cytomorphologic features.  The plasma cells display kappa light chain restriction consistent with plasma cell neoplasm.  Correlation with cytogenetic and FISH studies is recommended.    07/24/2020 Initial Diagnosis   Multiple myeloma without remission ([facility])   07/24/2020 Cancer Staging   Staging form: Plasma Cell Myeloma and Plasma Cell Disorders, AJCC 8th Edition - Clinical stage from 07/24/2020: RISS Stage I (Beta-2-microglobulin (mg/L): 2.1, Albumin (g/dL): 3.7, ISS: Stage I, High-risk cytogenetics: Absent, LDH: Normal) - Signed by Heath Lark, MD on 07/24/2020 Stage prefix: Initial diagnosis Beta 2 microglobulin range (mg/L): Less than 3.5 Albumin range (g/dL): Greater than or equal to 3.5 Cytogenetics: No abnormalities    09/03/2020 -  Chemotherapy   Patient is on Treatment Plan : MYELOMA NEWLY DIAGNOSED TRANSPLANT CANDIDATE DaraVRd (Daratumumab SQ) q21d x 6 Cycles (Induction/Consolidation)       CANCER STAGING: Cancer Staging Multiple myeloma without remission ([facility]) Staging form: Plasma Cell Myeloma and Plasma Cell Disorders, AJCC 8th  Edition - Clinical stage from 07/24/2020: RISS Stage I (Beta-2-microglobulin (mg/L): 2.1, Albumin (g/dL): 3.7, ISS: Stage I, High-risk cytogenetics: Absent, LDH: Normal) - Signed by Heath Lark, MD on 07/24/2020   INTERVAL HISTORY:  Eugene [name], a 65 y.o. male, returns for routine follow-up and consideration for next cycle of chemotherapy. Eugene [name] was last seen on 10/15/2020.  Due for cycle #4 of Velcade today.   Overall, Eugene [name] tells me Eugene [name] has been feeling pretty well. Eugene [name] reports constipation for which Eugene [name] uses prune juice and 2 colace daily. His back and right thigh pain are not currently present. Eugene [name] denies infections, fevers, numbness/tingling, and n/v/d. Eugene [name] has not returned to work at this time. Eugene [name] reports mild fatigue.  Overall, Eugene [name] feels ready for next cycle of chemo today.   REVIEW OF SYSTEMS:  Review of Systems  Constitutional:  Positive for fatigue (60%). Negative for appetite change (80%) and fever.  Gastrointestinal:  Positive for constipation. Negative for diarrhea, nausea and vomiting.  Musculoskeletal:  Negative for back pain and myalgias.  Psychiatric/Behavioral:  Positive for sleep disturbance.   All other systems reviewed and are negative.  PAST MEDICAL/SURGICAL HISTORY:  Past Medical History:  Diagnosis Date   Back pain    CAD (coronary artery disease)    a. 01/2017 NSTEMI/Cath: LM nl, LAD 25p - ? hypodense but no filling defect, RI nl, LCX nl, RCA nl, EF 55-65%-->Med Rx.   Erectile dysfunction    History of echocardiogram    a. 01/2017 Echo: EF 55-60%, no rwma.   Hypertension    Past Surgical History:  Procedure Laterality Date   LEFT HEART CATH AND CORONARY ANGIOGRAPHY N/A 01/15/2017   Procedure: LEFT HEART  CATH AND CORONARY ANGIOGRAPHY;  Surgeon: Martinique, Peter M, MD;  Location: [facility];  Service: Cardiovascular;  Laterality: N/A;   ORIF trocanteric femoral IM Nail Right     SOCIAL HISTORY:  Social History   Socioeconomic History   Marital status:  Significant Other    Spouse name: Not on file   Number of children: Not on file   Years of education: Not on file   Highest education level: Not on file  Occupational History   Not on file  Tobacco Use   Smoking status: Never   Smokeless tobacco: Never  Vaping Use   Vaping Use: Never used  Substance and Sexual Activity   Alcohol use: No   Drug use: No   Sexual activity: Yes  Other Topics Concern   Not on file  Social History Narrative   Not on file   Social Determinants of Health   Financial Resource Strain: Low Risk    Difficulty of Paying Living Expenses: Not hard at all  Food Insecurity: No Food Insecurity   Worried About Charity fundraiser in the Last Year: Never true   Ran Out of Food in the Last Year: Never true  Transportation Needs: No Transportation Needs   Lack of Transportation (Medical): No   Lack of Transportation (Non-Medical): No  Physical Activity: Insufficiently Active   Days of Exercise per Week: 2 days   Minutes of Exercise per Session: 10 min  Stress: No Stress Concern Present   Feeling of Stress : Not at all  Social Connections: Moderately Integrated   Frequency of Communication with Friends and Family: More than three times a week   Frequency of Social Gatherings with Friends and Family: More than three times a week   Attends Religious Services: 1 to 4 times per year   Active Member of [employer] or Organizations: No   Attends Music therapist: 1 to 4 times per year   Marital Status: Divorced  Human resources officer Violence: Not At Risk   Fear of Current or Ex-Partner: No   Emotionally Abused: No   Physically Abused: No   Sexually Abused: No    FAMILY HISTORY:  Family History  Problem Relation Age of Onset   Cancer Mother        breast   Diabetes Mother    Diabetes Sister    Hypertension Sister    Cancer Brother    Mental illness Sister     CURRENT MEDICATIONS:  Current Outpatient Medications  Medication Sig Dispense Refill    acyclovir (ZOVIRAX) 400 MG tablet Take 1 tablet (400 mg total) by mouth 2 (two) times daily. 60 tablet 5   aspirin EC 81 MG tablet Take 1 tablet (81 mg total) by mouth daily with breakfast. 30 tablet 5   BORTEZOMIB IJ Inject as directed once a week.     calcium-vitamin D (OSCAL WITH D) 500-200 MG-UNIT tablet Take 1 tablet by mouth.     DARATUMUMAB-HYALURONIDASE-FIHJ [place] Inject into the skin once a week.     dexamethasone (DECADRON) 4 MG tablet Take 10 tablets (40 mg total) by mouth once a week. 40 tablet 6   metoprolol succinate (TOPROL-XL) 25 MG 24 hr tablet TAKE 1 TABLET BY MOUTH DAILY. (Patient taking differently: Take 25 mg by mouth daily.) 90 tablet 3   Multiple Vitamin (MULTIVITAMIN) tablet Take 1 tablet by mouth daily.       nitroGLYCERIN (NITROSTAT) 0.4 MG SL tablet Place 1 tablet (0.4 mg total)  under the tongue every 5 (five) minutes for 3 doses as needed for chest pain. 25 tablet 3   REVLIMID 25 MG capsule TAKE 1 CAPSULE BY MOUTH ONCE DAILY FOR 14 DAYS ON AND 7 DAYS OFF 14 capsule 0   rosuvastatin (CRESTOR) 40 MG tablet Take 1 tablet (40 mg total) by mouth daily. 90 tablet 3   traMADol (ULTRAM) 50 MG tablet Take 1 tablet (50 mg total) by mouth every 6 (six) hours as needed. 60 tablet 0   No current facility-administered medications for this visit.    ALLERGIES:  No Known Allergies  PHYSICAL EXAM:  Performance status (ECOG): 1 - Symptomatic but completely ambulatory  There were no vitals filed for this visit. Wt Readings from Last 3 Encounters:  10/29/20 223 lb 9.6 oz (101.4 kg)  10/22/20 220 lb 12.8 oz (100.2 kg)  10/15/20 221 lb (100.2 kg)   Physical Exam Vitals reviewed.  Constitutional:      Appearance: Normal appearance.  Cardiovascular:     Rate and Rhythm: Normal rate and regular rhythm.     Pulses: Normal pulses.     Heart sounds: Normal heart sounds.  Pulmonary:     Effort: Pulmonary effort is normal.     Breath sounds: Normal breath sounds.  Neurological:      General: No focal deficit present.     Mental Status: Eugene [name] is alert and oriented to person, place, and time.  Psychiatric:        Mood and Affect: Mood normal.        Behavior: Behavior normal.    LABORATORY DATA:  I have reviewed the labs as listed.  CBC Latest Ref Rng & Units 10/29/2020 10/22/2020 10/15/2020  WBC 4.0 - 10.5 K/uL 6.2 4.9 5.3  Hemoglobin 13.0 - 17.0 g/dL 12.8(L) 12.0(L) 12.2(L)  Hematocrit 39.0 - 52.0 % 40.1 37.5(L) 38.7(L)  Platelets 150 - 400 K/uL 206 222 331   CMP Latest Ref Rng & Units 10/29/2020 10/22/2020 10/15/2020  Glucose 70 - 99 mg/dL 155(H) 153(H) 135(H)  BUN 8 - 23 mg/dL _0 Creatinine 0.61 - 1.24 mg/dL 1.03 1.01 0.92  Sodium 135 - 145 mmol/L 135 137 134(L)  Potassium 3.5 - 5.1 mmol/L 3.4(L) 3.5 3.7  Chloride 98 - 111 mmol/L 102 103 101  CO2 22 - 32 mmol/L _1 Calcium 8.9 - 10.3 mg/dL 8.8(L) 8.7(L) 8.7(L)  Total Protein 6.5 - 8.1 g/dL 7.2 7.0 7.5  Total Bilirubin 0.3 - 1.2 mg/dL 0.9 0.8 0.8  Alkaline Phos 38 - 126 U/L 80 81 91  AST 15 - 41 U/L _2 ALT 0 - 44 U/L _3 DIAGNOSTIC IMAGING:  I have independently reviewed the scans and discussed with the patient. No results found.   ASSESSMENT:  1.  Standard risk plasma cell myeloma: - Eugene [name] reported low back pain radiating to the right thigh since April, pain gets worse when Eugene [name] walks for more than 2 blocks. 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  Eugene [name] had an abnormal M spike of 3.2 g on 12/06/2018. - Skeletal survey on 10/22/2017 showed no suspicious lytic lesions. - X-ray of the right sided pelvis on 06/11/2020 showed prominent lytic lesions noted in the proximal midportion of the right femoral diaphysis with no evidence of fracture or dislocation. - Bone marrow biopsy on 07/09/2020 with hypercellular marrow with trilineage hematopoiesis.  Plasma cells representing 10% of  cells. - Chromosome analysis 46, XY (20).  Multiple myeloma FISH panel negative. - PET  scan on 07/04/2020 with multiple hypermetabolic bony soft tissue lesions.  Dominant lesions in the L5 vertebral body and posterior right acetabulum and distal right femur.  Focal increased uptake in the right tonsillar region with associated soft tissue fullness on CT imaging. - 24-hour urine with total protein 134. - Beta-2 microglobulin 2.1 (06/26/2020), LDH 136. - Dara RVD cycle 1 started on 09/03/2020.  2.  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 - Eugene [name] was never smoker. - 2 brothers died of metastatic lung cancer.  Sister has thyroid cancer.  Mother had breast cancer.   PLAN:  1.  Stage I standard risk IgG kappa multiple myeloma: - Eugene [name] completed 3 cycles of Dara VRD. - Myeloma panel from 10/29/2020 shows M spike improved to 0.5.  Free light chain ratio improved to 2.64 and kappa light chains 27.5. - Continue Revlimid 25 mg 2 weeks on/1 week off.  Eugene [name] has mild constipation controlled with prune juice and stool softener. - Reviewed labs from today which showed normal creatinine and LFTs.  Electrolytes were normal.  White count and platelet count is adequate. - Eugene [name] will proceed with cycle 4 today. - Eugene [name] met with Dr. Aris Lot at [facility] for transplant consultation.  Eugene [name] is choosing to do delayed transplant. - We will message [facility] for stem cell collection. - RTC 3 weeks for follow-up.  2.  Right mid thigh pain: - Right femur ORIF trochanteric femoral IM nail on 08/09/2020. - Radiation therapy was recommended to the right femur by Dr. Ysidro Evert at [facility] therapy was not started as Eugene [name] started systemic therapy. - Eugene [name] denies any pain in the right thigh region.  3.  Infection prophylaxis: - Continue acyclovir 400 mg twice daily. - Continue aspirin 81 mg daily for thromboprophylaxis.  4.  Myeloma bone disease: - Continue Zometa every 4 weeks.  Calcium is 8.7.  Continue calcium supplements.   Orders  placed this encounter:  No orders of the defined types were placed in this encounter.    Derek Jack, MD [facility] [phone]   I, Thana Ates, am acting as a scribe for Dr. Derek Jack.  I, Derek Jack MD, have reviewed the above documentation for accuracy and completeness, and I agree with the above.

## 2020-11-05 ENCOUNTER — Other Ambulatory Visit: Payer: Self-pay

## 2020-11-05 ENCOUNTER — Inpatient Hospital Stay (HOSPITAL_COMMUNITY): Payer: 59

## 2020-11-05 ENCOUNTER — Encounter (HOSPITAL_COMMUNITY): Payer: Self-pay

## 2020-11-05 ENCOUNTER — Inpatient Hospital Stay (HOSPITAL_BASED_OUTPATIENT_CLINIC_OR_DEPARTMENT_OTHER): Payer: 59 | Admitting: Hematology

## 2020-11-05 VITALS — BP 117/74 | HR 70 | Temp 98.0°F | Resp 18 | Wt 221.5 lb

## 2020-11-05 DIAGNOSIS — C9000 Multiple myeloma not having achieved remission: Secondary | ICD-10-CM

## 2020-11-05 DIAGNOSIS — M899 Disorder of bone, unspecified: Secondary | ICD-10-CM | POA: Diagnosis not present

## 2020-11-05 DIAGNOSIS — D472 Monoclonal gammopathy: Secondary | ICD-10-CM | POA: Diagnosis not present

## 2020-11-05 DIAGNOSIS — Z5112 Encounter for antineoplastic immunotherapy: Secondary | ICD-10-CM | POA: Diagnosis not present

## 2020-11-05 DIAGNOSIS — Z23 Encounter for immunization: Secondary | ICD-10-CM | POA: Diagnosis not present

## 2020-11-05 LAB — CBC WITH DIFFERENTIAL/PLATELET
Abs Immature Granulocytes: 0.01 10*3/uL (ref 0.00–0.07)
Basophils Absolute: 0 10*3/uL (ref 0.0–0.1)
Basophils Relative: 0 %
Eosinophils Absolute: 0.1 10*3/uL (ref 0.0–0.5)
Eosinophils Relative: 1 %
HCT: 37.9 % — ABNORMAL LOW (ref 39.0–52.0)
Hemoglobin: 12.4 g/dL — ABNORMAL LOW (ref 13.0–17.0)
Immature Granulocytes: 0 %
Lymphocytes Relative: 50 %
Lymphs Abs: 2.4 10*3/uL (ref 0.7–4.0)
MCH: 32.2 pg (ref 26.0–34.0)
MCHC: 32.7 g/dL (ref 30.0–36.0)
MCV: 98.4 fL (ref 80.0–100.0)
Monocytes Absolute: 0.5 10*3/uL (ref 0.1–1.0)
Monocytes Relative: 11 %
Neutro Abs: 1.8 10*3/uL (ref 1.7–7.7)
Neutrophils Relative %: 38 %
Platelets: 208 10*3/uL (ref 150–400)
RBC: 3.85 MIL/uL — ABNORMAL LOW (ref 4.22–5.81)
RDW: 14.3 % (ref 11.5–15.5)
WBC: 4.8 10*3/uL (ref 4.0–10.5)
nRBC: 0 % (ref 0.0–0.2)

## 2020-11-05 LAB — COMPREHENSIVE METABOLIC PANEL
ALT: 18 U/L (ref 0–44)
AST: 20 U/L (ref 15–41)
Albumin: 3.8 g/dL (ref 3.5–5.0)
Alkaline Phosphatase: 69 U/L (ref 38–126)
Anion gap: 6 (ref 5–15)
BUN: 12 mg/dL (ref 8–23)
CO2: 27 mmol/L (ref 22–32)
Calcium: 8.7 mg/dL — ABNORMAL LOW (ref 8.9–10.3)
Chloride: 103 mmol/L (ref 98–111)
Creatinine, Ser: 0.94 mg/dL (ref 0.61–1.24)
GFR, Estimated: >60 mL/min (ref >60)
Glucose, Bld: 138 mg/dL — ABNORMAL HIGH (ref 70–99)
Potassium: 3.6 mmol/L (ref 3.5–5.1)
Sodium: 136 mmol/L (ref 135–145)
Total Bilirubin: 0.7 mg/dL (ref 0.3–1.2)
Total Protein: 7.1 g/dL (ref 6.5–8.1)

## 2020-11-05 LAB — MAGNESIUM: Magnesium: 2 mg/dL (ref 1.7–2.4)

## 2020-11-05 MED ORDER — DIPHENHYDRAMINE HCL 25 MG PO CAPS
25.0000 mg | ORAL_CAPSULE | Freq: Once | ORAL | Status: AC
  Administered 2020-11-05: 25 mg via ORAL
  Filled 2020-11-05: qty 1

## 2020-11-05 MED ORDER — ACETAMINOPHEN 325 MG PO TABS
650.0000 mg | ORAL_TABLET | Freq: Once | ORAL | Status: AC
  Administered 2020-11-05: 650 mg via ORAL
  Filled 2020-11-05: qty 2

## 2020-11-05 MED ORDER — BORTEZOMIB CHEMO SQ INJECTION 3.5 MG (2.5MG/ML)
1.3000 mg/m2 | Freq: Once | INTRAMUSCULAR | Status: AC
  Administered 2020-11-05: 2.75 mg via SUBCUTANEOUS
  Filled 2020-11-05: qty 1.1

## 2020-11-05 MED ORDER — SODIUM CHLORIDE 0.9 % IV SOLN: Freq: Once | INTRAVENOUS | Status: AC

## 2020-11-05 MED ORDER — ZOLEDRONIC ACID 4 MG/100ML IV SOLN
4.0000 mg | Freq: Once | INTRAVENOUS | Status: AC
  Administered 2020-11-05: 4 mg via INTRAVENOUS
  Filled 2020-11-05: qty 100

## 2020-11-05 MED ORDER — DEXAMETHASONE 4 MG PO TABS
40.0000 mg | ORAL_TABLET | Freq: Once | ORAL | Status: AC
  Administered 2020-11-05: 40 mg via ORAL
  Filled 2020-11-05: qty 10

## 2020-11-05 MED ORDER — DARATUMUMAB-HYALURONIDASE-FIHJ 1800-30000 MG-UT/15ML ~~LOC~~ SOLN
1800.0000 mg | Freq: Once | SUBCUTANEOUS | Status: AC
  Administered 2020-11-05: 1800 mg via SUBCUTANEOUS
  Filled 2020-11-05: qty 15

## 2020-11-05 NOTE — Progress Notes (Signed)
Treatment given today per MD orders. Tolerated without adverse affects. Vital signs stable. No complaints at this time. Discharged from clinic ambulatory in stable condition. Alert and oriented x 3. F/U with [facility] as scheduled.   

## 2020-11-05 NOTE — Progress Notes (Signed)
Called and spoke with [facility] Transplant Coordinator today, Wells Guiles. She is aware that patient started Cycle 4 today and is seemingly interested in harvesting stem cells for future transplant if they can discuss that with him further. Wells Guiles advises that they have reached out to his insurance to ensure this is an option and will then reach out to the patient with an answer.

## 2020-11-05 NOTE — Patient Instructions (Signed)
[facility]  Discharge Instructions: Thank you for choosing [facility] to provide your oncology and hematology care.  If you have a lab appointment with the [facility], please come in thru the Main Entrance and check in at the main information desk.  Wear comfortable clothing and clothing appropriate for easy access to any Portacath or PICC line.   We strive to give you quality time with your provider. You may need to reschedule your appointment if you arrive late (15 or more minutes).  Arriving late affects you and other patients whose appointments are after yours.  Also, if you miss three or more appointments without notifying the office, you may be dismissed from the clinic at the provider's discretion.      For prescription refill requests, have your pharmacy contact our office and allow 72 hours for refills to be completed.    Today you received the following chemotherapy and/or immunotherapy agents Zometa, Darzalex Faspro, Velcade.       To help prevent nausea and vomiting after your treatment, we encourage you to take your nausea medication as directed.  BELOW ARE SYMPTOMS THAT SHOULD BE REPORTED IMMEDIATELY: *FEVER GREATER THAN 100.4 F (38 C) OR HIGHER *CHILLS OR SWEATING *NAUSEA AND VOMITING THAT IS NOT CONTROLLED WITH YOUR NAUSEA MEDICATION *UNUSUAL SHORTNESS OF BREATH *UNUSUAL BRUISING OR BLEEDING *URINARY PROBLEMS (pain or burning when urinating, or frequent urination) *BOWEL PROBLEMS (unusual diarrhea, constipation, pain near the anus) TENDERNESS IN MOUTH AND THROAT WITH OR WITHOUT PRESENCE OF ULCERS (sore throat, sores in mouth, or a toothache) UNUSUAL RASH, SWELLING OR PAIN  UNUSUAL VAGINAL DISCHARGE OR ITCHING   Items with * indicate a potential emergency and should be followed up as soon as possible or go to the Emergency Department if any problems should occur.  Please show the CHEMOTHERAPY ALERT CARD or IMMUNOTHERAPY ALERT CARD at  check-in to the Emergency Department and triage nurse.  Should you have questions after your visit or need to cancel or reschedule your appointment, please contact [facility] [phone]  and follow the prompts.  Office hours are 8:00 a.m. to 4:30 p.m. Monday - Friday. Please note that voicemails left after 4:00 p.m. may not be returned until the following business day.  We are closed weekends and major holidays. You have access to a nurse at all times for urgent questions. Please call the main number to the clinic [phone] and follow the prompts.  For any non-urgent questions, you may also contact your provider using MyChart. We now offer e-Visits for anyone 22 and older to request care online for non-urgent symptoms. For details visit [email or web address].   Also download the MyChart app! Go to the app store, search "MyChart", open the app, select [facility], and log in with your MyChart username and password.  Due to Covid, a mask is required upon entering the hospital/clinic. If you do not have a mask, one will be given to you upon arrival. For doctor visits, patients may have 1 support person aged 69 or older with them. For treatment visits, patients cannot have anyone with them due to current Covid guidelines and our immunocompromised population.

## 2020-11-05 NOTE — Progress Notes (Signed)
Labs reviewed with MD today. Will proceed with treatment today per MD.

## 2020-11-05 NOTE — Patient Instructions (Signed)
[place] at [facility] Discharge Instructions  You were seen and examined today by Dr. Delton Coombes.  You will proceed with treatment today.  Return as scheduled for lab work and treatment.    Thank you for choosing [facility] at [facility] to provide your oncology and hematology care.  To afford each patient quality time with our provider, please arrive at least 15 minutes before your scheduled appointment time.   If you have a lab appointment with the [facility] please come in thru the Main Entrance and check in at the main information desk.  You need to re-schedule your appointment should you arrive 10 or more minutes late.  We strive to give you quality time with our providers, and arriving late affects you and other patients whose appointments are after yours.  Also, if you no show three or more times for appointments you may be dismissed from the clinic at the providers discretion.     Again, thank you for choosing [facility].  Our hope is that these requests will decrease the amount of time that you wait before being seen by our physicians.       _____________________________________________________________  Should you have questions after your visit to [facility], please contact our office at [phone] and follow the prompts.  Our office hours are 8:00 a.m. and 4:30 p.m. Monday - Friday.  Please note that voicemails left after 4:00 p.m. may not be returned until the following business day.  We are closed weekends and major holidays.  You do have access to a nurse 24-7, just call the main number to the clinic [phone] and do not press any options, hold on the line and a nurse will answer the phone.    For prescription refill requests, have your pharmacy contact our office and allow 72 hours.    Due to Covid, you will need to wear a mask upon entering the hospital. If you do not have a mask, a mask  will be given to you at the Main Entrance upon arrival. For doctor visits, patients may have 1 support person age 39 or older with them. For treatment visits, patients can not have anyone with them due to social distancing guidelines and our immunocompromised population.

## 2020-11-05 NOTE — Progress Notes (Signed)
Patient is taking Revlimid as prescribed.  He has not missed any doses and reports no side effects at this time.    Patient has been examined, vital signs and labs have been reviewed by Dr. Delton Coombes. ANC, Creatinine, LFTs, hemoglobin, and platelets are within treatment parameters per Dr. Delton Coombes. Patient may proceed with treatment per M.D.

## 2020-11-06 DIAGNOSIS — M899 Disorder of bone, unspecified: Secondary | ICD-10-CM | POA: Diagnosis not present

## 2020-11-06 DIAGNOSIS — C9000 Multiple myeloma not having achieved remission: Secondary | ICD-10-CM | POA: Diagnosis not present

## 2020-11-06 DIAGNOSIS — Z9889 Other specified postprocedural states: Secondary | ICD-10-CM | POA: Diagnosis not present

## 2020-11-07 ENCOUNTER — Encounter (HOSPITAL_COMMUNITY): Payer: Self-pay | Admitting: Hematology

## 2020-11-07 ENCOUNTER — Encounter (HOSPITAL_COMMUNITY): Payer: Self-pay | Admitting: Hematology and Oncology

## 2020-11-12 ENCOUNTER — Other Ambulatory Visit: Payer: Self-pay

## 2020-11-12 ENCOUNTER — Encounter (HOSPITAL_COMMUNITY): Payer: Self-pay

## 2020-11-12 ENCOUNTER — Inpatient Hospital Stay (HOSPITAL_COMMUNITY): Payer: 59

## 2020-11-12 ENCOUNTER — Inpatient Hospital Stay (HOSPITAL_COMMUNITY): Payer: 59 | Attending: Hematology

## 2020-11-12 VITALS — BP 126/74 | HR 83 | Temp 96.6°F | Resp 18 | Wt 223.4 lb

## 2020-11-12 DIAGNOSIS — C9000 Multiple myeloma not having achieved remission: Secondary | ICD-10-CM | POA: Diagnosis not present

## 2020-11-12 DIAGNOSIS — Z5112 Encounter for antineoplastic immunotherapy: Secondary | ICD-10-CM | POA: Diagnosis not present

## 2020-11-12 LAB — CBC WITH DIFFERENTIAL/PLATELET
Abs Immature Granulocytes: 0.02 10*3/uL (ref 0.00–0.07)
Basophils Absolute: 0 10*3/uL (ref 0.0–0.1)
Basophils Relative: 1 %
Eosinophils Absolute: 0.4 10*3/uL (ref 0.0–0.5)
Eosinophils Relative: 9 %
HCT: 39.1 % (ref 39.0–52.0)
Hemoglobin: 13.1 g/dL (ref 13.0–17.0)
Immature Granulocytes: 0 %
Lymphocytes Relative: 48 %
Lymphs Abs: 2.5 10*3/uL (ref 0.7–4.0)
MCH: 32.4 pg (ref 26.0–34.0)
MCHC: 33.5 g/dL (ref 30.0–36.0)
MCV: 96.8 fL (ref 80.0–100.0)
Monocytes Absolute: 0.4 10*3/uL (ref 0.1–1.0)
Monocytes Relative: 8 %
Neutro Abs: 1.7 10*3/uL (ref 1.7–7.7)
Neutrophils Relative %: 34 %
Platelets: 217 10*3/uL (ref 150–400)
RBC: 4.04 MIL/uL — ABNORMAL LOW (ref 4.22–5.81)
RDW: 14.2 % (ref 11.5–15.5)
WBC: 5.1 10*3/uL (ref 4.0–10.5)
nRBC: 0 % (ref 0.0–0.2)

## 2020-11-12 LAB — COMPREHENSIVE METABOLIC PANEL
ALT: 16 U/L (ref 0–44)
AST: 20 U/L (ref 15–41)
Albumin: 4.1 g/dL (ref 3.5–5.0)
Alkaline Phosphatase: 73 U/L (ref 38–126)
Anion gap: 8 (ref 5–15)
BUN: 10 mg/dL (ref 8–23)
CO2: 26 mmol/L (ref 22–32)
Calcium: 9.1 mg/dL (ref 8.9–10.3)
Chloride: 102 mmol/L (ref 98–111)
Creatinine, Ser: 0.96 mg/dL (ref 0.61–1.24)
GFR, Estimated: >60 mL/min (ref >60)
Glucose, Bld: 130 mg/dL — ABNORMAL HIGH (ref 70–99)
Potassium: 3.6 mmol/L (ref 3.5–5.1)
Sodium: 136 mmol/L (ref 135–145)
Total Bilirubin: 0.9 mg/dL (ref 0.3–1.2)
Total Protein: 7.4 g/dL (ref 6.5–8.1)

## 2020-11-12 LAB — MAGNESIUM: Magnesium: 2.1 mg/dL (ref 1.7–2.4)

## 2020-11-12 MED ORDER — ACETAMINOPHEN 325 MG PO TABS
650.0000 mg | ORAL_TABLET | Freq: Once | ORAL | Status: AC
  Administered 2020-11-12: 650 mg via ORAL
  Filled 2020-11-12: qty 2

## 2020-11-12 MED ORDER — DARATUMUMAB-HYALURONIDASE-FIHJ 1800-30000 MG-UT/15ML ~~LOC~~ SOLN
1800.0000 mg | Freq: Once | SUBCUTANEOUS | Status: AC
  Administered 2020-11-12: 1800 mg via SUBCUTANEOUS
  Filled 2020-11-12: qty 15

## 2020-11-12 MED ORDER — DEXAMETHASONE 4 MG PO TABS
40.0000 mg | ORAL_TABLET | Freq: Once | ORAL | Status: AC
  Administered 2020-11-12: 40 mg via ORAL
  Filled 2020-11-12: qty 10

## 2020-11-12 MED ORDER — DIPHENHYDRAMINE HCL 25 MG PO CAPS
25.0000 mg | ORAL_CAPSULE | Freq: Once | ORAL | Status: AC
  Administered 2020-11-12: 25 mg via ORAL
  Filled 2020-11-12: qty 1

## 2020-11-12 MED ORDER — BORTEZOMIB CHEMO SQ INJECTION 3.5 MG (2.5MG/ML)
1.3000 mg/m2 | Freq: Once | INTRAMUSCULAR | Status: AC
  Administered 2020-11-12: 2.75 mg via SUBCUTANEOUS
  Filled 2020-11-12: qty 1.1

## 2020-11-12 NOTE — Progress Notes (Signed)
Patient tolerated Velcade injection with no complaints voiced. Lab work reviewed. See MAR for details. Injection site clean and dry with no bruising or swelling noted. Patient stable during and after injection. Band aid applied.  Patient tolerated Daratumumab injection with no complaints voiced. See MAR for details. Lab reviewed. Injection site clean and dry with no bruising or swelling noted at site. Band aid applied. Vss with discharge and left in satisfactory condition with nos/s of distress noted.

## 2020-11-12 NOTE — Patient Instructions (Signed)
[facility]  Discharge Instructions: Thank you for choosing [facility] to provide your oncology and hematology care.  If you have a lab appointment with the [facility], please come in thru the Main Entrance and check in at the main information desk.  Wear comfortable clothing and clothing appropriate for easy access to any Portacath or PICC line.   We strive to give you quality time with your provider. You may need to reschedule your appointment if you arrive late (15 or more minutes).  Arriving late affects you and other patients whose appointments are after yours.  Also, if you miss three or more appointments without notifying the office, you may be dismissed from the clinic at the provider's discretion.      For prescription refill requests, have your pharmacy contact our office and allow 72 hours for refills to be completed.    Today you received the following chemotherapy and/or immunotherapy agents Velcade and Daratumumab, return as scheduled.   To help prevent nausea and vomiting after your treatment, we encourage you to take your nausea medication as directed.  BELOW ARE SYMPTOMS THAT SHOULD BE REPORTED IMMEDIATELY: *FEVER GREATER THAN 100.4 F (38 C) OR HIGHER *CHILLS OR SWEATING *NAUSEA AND VOMITING THAT IS NOT CONTROLLED WITH YOUR NAUSEA MEDICATION *UNUSUAL SHORTNESS OF BREATH *UNUSUAL BRUISING OR BLEEDING *URINARY PROBLEMS (pain or burning when urinating, or frequent urination) *BOWEL PROBLEMS (unusual diarrhea, constipation, pain near the anus) TENDERNESS IN MOUTH AND THROAT WITH OR WITHOUT PRESENCE OF ULCERS (sore throat, sores in mouth, or a toothache) UNUSUAL RASH, SWELLING OR PAIN  UNUSUAL VAGINAL DISCHARGE OR ITCHING   Items with * indicate a potential emergency and should be followed up as soon as possible or go to the Emergency Department if any problems should occur.  Please show the CHEMOTHERAPY ALERT CARD or IMMUNOTHERAPY ALERT CARD at  check-in to the Emergency Department and triage nurse.  Should you have questions after your visit or need to cancel or reschedule your appointment, please contact [facility] [phone]  and follow the prompts.  Office hours are 8:00 a.m. to 4:30 p.m. Monday - Friday. Please note that voicemails left after 4:00 p.m. may not be returned until the following business day.  We are closed weekends and major holidays. You have access to a nurse at all times for urgent questions. Please call the main number to the clinic [phone] and follow the prompts.  For any non-urgent questions, you may also contact your provider using MyChart. We now offer e-Visits for anyone 29 and older to request care online for non-urgent symptoms. For details visit [email or web address].   Also download the MyChart app! Go to the app store, search "MyChart", open the app, select [facility], and log in with your MyChart username and password.  Due to Covid, a mask is required upon entering the hospital/clinic. If you do not have a mask, one will be given to you upon arrival. For doctor visits, patients may have 1 support person aged 72 or older with them. For treatment visits, patients cannot have anyone with them due to current Covid guidelines and our immunocompromised population.

## 2020-11-13 ENCOUNTER — Ambulatory Visit (INDEPENDENT_AMBULATORY_CARE_PROVIDER_SITE_OTHER): Payer: 59 | Admitting: Internal Medicine

## 2020-11-13 ENCOUNTER — Encounter: Payer: Self-pay | Admitting: Internal Medicine

## 2020-11-13 ENCOUNTER — Other Ambulatory Visit (HOSPITAL_COMMUNITY): Payer: Self-pay

## 2020-11-13 VITALS — BP 134/66 | HR 99 | Resp 18 | Ht 69.0 in | Wt 223.0 lb

## 2020-11-13 DIAGNOSIS — C9000 Multiple myeloma not having achieved remission: Secondary | ICD-10-CM | POA: Diagnosis not present

## 2020-11-13 DIAGNOSIS — Z23 Encounter for immunization: Secondary | ICD-10-CM | POA: Diagnosis not present

## 2020-11-13 DIAGNOSIS — I10 Essential (primary) hypertension: Secondary | ICD-10-CM

## 2020-11-13 DIAGNOSIS — Z0001 Encounter for general adult medical examination with abnormal findings: Secondary | ICD-10-CM | POA: Diagnosis not present

## 2020-11-13 DIAGNOSIS — E782 Mixed hyperlipidemia: Secondary | ICD-10-CM | POA: Diagnosis not present

## 2020-11-13 DIAGNOSIS — I2510 Atherosclerotic heart disease of native coronary artery without angina pectoris: Secondary | ICD-10-CM

## 2020-11-13 DIAGNOSIS — Z1211 Encounter for screening for malignant neoplasm of colon: Secondary | ICD-10-CM | POA: Diagnosis not present

## 2020-11-13 MED ORDER — ROSUVASTATIN CALCIUM 40 MG PO TABS
40.0000 mg | ORAL_TABLET | ORAL | 1 refills | Status: DC
  Filled 2020-11-13: qty 45, 90d supply, fill #0

## 2020-11-13 NOTE — Addendum Note (Signed)
Addended by: Zacarias Pontes R on: 11/13/2020 11:03 AM   Modules accepted: Orders

## 2020-11-13 NOTE — Progress Notes (Signed)
Established Patient Office Visit  Subjective:  Patient ID: Eugene [name], male    DOB: [birth year]  Age: 65 y.o. MRN: [id number]  CC:  Chief Complaint  Patient presents with   Annual Exam    Annual exam     HPI Eugene [name] a 65 year old male with PMH of CAD, HTN, multiple myeloma and obesity who presents for annual physical.  HTN: His BP was elevated initially during office visit, but improved later. He states that he didn't sleep well last night. Denies any chest pain, dyspnea or palpitations.  CAD: Takes Aspirin and Metoprolol. Does not take Crestor regularly, advised to take it at least QOD.  MM: Followed by Oncology, undergoing chemotherapy.  He received PCV20 in the office today. Takes Acyclovir for herpes PPX.  Past Medical History:  Diagnosis Date   Back pain    CAD (coronary artery disease)    a. 01/2017 NSTEMI/Cath: LM nl, LAD 25p - ? hypodense but no filling defect, RI nl, LCX nl, RCA nl, EF 55-65%-->Med Rx.   Erectile dysfunction    History of echocardiogram    a. 01/2017 Echo: EF 55-60%, no rwma.   Hypertension     Past Surgical History:  Procedure Laterality Date   LEFT HEART CATH AND CORONARY ANGIOGRAPHY N/A 01/15/2017   Procedure: LEFT HEART CATH AND CORONARY ANGIOGRAPHY;  Surgeon: Martinique, Peter M, MD;  Location: [facility];  Service: Cardiovascular;  Laterality: N/A;   ORIF trocanteric femoral IM Nail Right     Family History  Problem Relation Age of Onset   Cancer Mother        breast   Diabetes Mother    Diabetes Sister    Hypertension Sister    Cancer Brother    Mental illness Sister     Social History   Socioeconomic History   Marital status: Significant Other    Spouse name: Not on file   Number of children: Not on file   Years of education: Not on file   Highest education level: Not on file  Occupational History   Not on file  Tobacco Use   Smoking status: Never   Smokeless tobacco: Never  Vaping Use   Vaping Use: Never  used  Substance and Sexual Activity   Alcohol use: No   Drug use: No   Sexual activity: Yes  Other Topics Concern   Not on file  Social History Narrative   Not on file   Social Determinants of Health   Financial Resource Strain: Low Risk    Difficulty of Paying Living Expenses: Not hard at all  Food Insecurity: No Food Insecurity   Worried About Charity fundraiser in the Last Year: Never true   [place] in the Last Year: Never true  Transportation Needs: No Transportation Needs   Lack of Transportation (Medical): No   Lack of Transportation (Non-Medical): No  Physical Activity: Insufficiently Active   Days of Exercise per Week: 2 days   Minutes of Exercise per Session: 10 min  Stress: No Stress Concern Present   Feeling of Stress : Not at all  Social Connections: Moderately Integrated   Frequency of Communication with Friends and Family: More than three times a week   Frequency of Social Gatherings with Friends and Family: More than three times a week   Attends Religious Services: 1 to 4 times per year   Active Member of [employer] or Organizations: No   Attends Archivist Meetings:  1 to 4 times per year   Marital Status: Divorced  Human resources officer Violence: Not At Risk   Fear of Current or Ex-Partner: No   Emotionally Abused: No   Physically Abused: No   Sexually Abused: No    Outpatient Medications Prior to Visit  Medication Sig Dispense Refill   acyclovir (ZOVIRAX) 400 MG tablet Take 1 tablet (400 mg total) by mouth 2 (two) times daily. 60 tablet 5   aspirin EC 81 MG tablet Take 1 tablet (81 mg total) by mouth daily with breakfast. 30 tablet 5   BORTEZOMIB IJ Inject as directed once a week.     calcium-vitamin D (OSCAL WITH D) 500-200 MG-UNIT tablet Take 1 tablet by mouth.     DARATUMUMAB-HYALURONIDASE-FIHJ [place] Inject into the skin once a week.     dexamethasone (DECADRON) 4 MG tablet Take 10 tablets (40 mg total) by mouth once a week. 40 tablet 6    metoprolol succinate (TOPROL-XL) 25 MG 24 hr tablet TAKE 1 TABLET BY MOUTH DAILY. (Patient taking differently: Take 25 mg by mouth daily.) 90 tablet 3   Multiple Vitamin (MULTIVITAMIN) tablet Take 1 tablet by mouth daily.       nitroGLYCERIN (NITROSTAT) 0.4 MG SL tablet Place 1 tablet (0.4 mg total) under the tongue every 5 (five) minutes for 3 doses as needed for chest pain. 25 tablet 3   REVLIMID 25 MG capsule TAKE 1 CAPSULE BY MOUTH ONCE DAILY FOR 14 DAYS ON AND 7 DAYS OFF 14 capsule 0   traMADol (ULTRAM) 50 MG tablet Take 1 tablet (50 mg total) by mouth every 6 (six) hours as needed. 60 tablet 0   rosuvastatin (CRESTOR) 40 MG tablet Take 1 tablet (40 mg total) by mouth daily. 90 tablet 3   No facility-administered medications prior to visit.    No Known Allergies  ROS Review of Systems  Constitutional:  Negative for chills and fever.  HENT:  Negative for congestion and sore throat.   Eyes:  Negative for pain and discharge.  Respiratory:  Negative for cough and shortness of breath.   Cardiovascular:  Negative for chest pain and palpitations.  Gastrointestinal:  Negative for constipation, diarrhea, nausea and vomiting.  Endocrine: Negative for polydipsia and polyuria.  Genitourinary:  Negative for dysuria and hematuria.  Musculoskeletal:  Positive for arthralgias. Negative for neck pain and neck stiffness.  Skin:  Negative for rash.  Neurological:  Negative for dizziness, weakness, numbness and headaches.  Psychiatric/Behavioral:  Negative for agitation and behavioral problems.      Objective:    Physical Exam Vitals reviewed.  Constitutional:      General: He is not in acute distress.    Appearance: He is not diaphoretic.  HENT:     Head: Normocephalic and atraumatic.     Nose: Nose normal.     Mouth/Throat:     Mouth: Mucous membranes are moist.  Eyes:     General: No scleral icterus.    Extraocular Movements: Extraocular movements intact.  Cardiovascular:     Rate and  Rhythm: Normal rate and regular rhythm.     Pulses: Normal pulses.     Heart sounds: Normal heart sounds. No murmur heard. Pulmonary:     Breath sounds: Normal breath sounds. No wheezing or rales.  Abdominal:     Palpations: Abdomen is soft.     Tenderness: There is no abdominal tenderness.  Musculoskeletal:     Cervical back: Neck supple. No tenderness.  Right lower leg: No edema.     Left lower leg: No edema.  Skin:    General: Skin is warm.     Findings: No rash.  Neurological:     General: No focal deficit present.     Mental Status: He is alert and oriented to person, place, and time.     Cranial Nerves: No cranial nerve deficit.     Sensory: No sensory deficit.     Motor: No weakness.  Psychiatric:        Mood and Affect: Mood normal.        Behavior: Behavior normal.    BP 134/66 (BP Location: Left Arm, Cuff Size: Normal)   Pulse 99   Resp 18   Ht $R'5\' 9"'HP$  (1.753 m)   Wt 223 lb 0.6 oz (101.2 kg)   SpO2 95%   BMI 32.94 kg/m  Wt Readings from Last 3 Encounters:  11/13/20 223 lb 0.6 oz (101.2 kg)  11/12/20 223 lb 6 oz (101.3 kg)  11/05/20 221 lb 8 oz (100.5 kg)     Health Maintenance Due  Topic Date Due   COVID-19 Vaccine (1) Never done   Pneumococcal Vaccine 33-46 Years old (1 - PCV) Never done   Zoster Vaccines- Shingrix (1 of 2) Never done   COLONOSCOPY (Pts 45-44yrs Insurance coverage will need to be confirmed)  04/07/2017   TETANUS/TDAP  03/26/2019    There are no preventive care reminders to display for this patient.  Lab Results  Component Value Date   TSH 2.140 07/16/2020   Lab Results  Component Value Date   WBC 5.1 11/12/2020   HGB 13.1 11/12/2020   HCT 39.1 11/12/2020   MCV 96.8 11/12/2020   PLT 217 11/12/2020   Lab Results  Component Value Date   NA 136 11/12/2020   K 3.6 11/12/2020   CO2 26 11/12/2020   GLUCOSE 130 (H) 11/12/2020   BUN 10 11/12/2020   CREATININE 0.96 11/12/2020   BILITOT 0.9 11/12/2020   ALKPHOS 73 11/12/2020    AST 20 11/12/2020   ALT 16 11/12/2020   PROT 7.4 11/12/2020   ALBUMIN 4.1 11/12/2020   CALCIUM 9.1 11/12/2020   ANIONGAP 8 11/12/2020   Lab Results  Component Value Date   CHOL 148 07/16/2020   Lab Results  Component Value Date   HDL 46 07/16/2020   Lab Results  Component Value Date   LDLCALC 92 07/16/2020   Lab Results  Component Value Date   TRIG 48 07/16/2020   Lab Results  Component Value Date   CHOLHDL 3.2 07/16/2020   Lab Results  Component Value Date   HGBA1C 5.4 07/16/2020      Assessment & Plan:   Problem List Items Addressed This Visit       Encounter for general adult medical examination with abnormal findings - Primary   Physical exam as noted above. Blood tests reviewed from chart. Does not want colonoscopy as undergoing treatment for MM, agrees for cologuard. Has had flu vaccine and 2 doses of COVID vaccine. PCV20 given today, patient will think about Shingrix and TDaP.       Cardiovascular and Mediastinum   CAD (coronary artery disease)    Has had cardiac cath in the past, no stents On Aspirin, statin and B-blocker F/u with Cardiology      Relevant Medications   rosuvastatin (CRESTOR) 40 MG tablet   Essential hypertension    BP Readings from Last 1 Encounters:  11/13/20 134/66  overall well-controlled with Metoprolol Counseled for compliance with the medications Advised DASH diet and moderate exercise/walking as tolerated      Relevant Medications   rosuvastatin (CRESTOR) 40 MG tablet     Other   Multiple myeloma without remission ([place])    Undergoing chemotherapy for MM Followed by Oncology      Mixed hyperlipidemia   Relevant Medications   rosuvastatin (CRESTOR) 40 MG tablet         Other Visit Diagnoses     Screening for colon cancer       Relevant Orders   Cologuard       Meds ordered this encounter  Medications   rosuvastatin (CRESTOR) 40 MG tablet    Sig: Take 1 tablet (40 mg total) by mouth every other  day.    Dispense:  90 tablet    Refill:  1    Follow-up: Return in 6 months (on 05/13/2021) for HTN and CAD.    [name], MD

## 2020-11-13 NOTE — Assessment & Plan Note (Signed)
BP Readings from Last 1 Encounters:  11/13/20 134/66   overall well-controlled with Metoprolol Counseled for compliance with the medications Advised DASH diet and moderate exercise/walking as tolerated

## 2020-11-13 NOTE — Assessment & Plan Note (Addendum)
Physical exam as noted above. Blood tests reviewed from chart. Does not want colonoscopy as undergoing treatment for MM, agrees for cologuard. Has had flu vaccine and 2 doses of COVID vaccine. PCV20 given today, patient will think about Shingrix and TDaP.

## 2020-11-13 NOTE — Assessment & Plan Note (Signed)
Undergoing chemotherapy for MM Followed by Oncology

## 2020-11-13 NOTE — Assessment & Plan Note (Signed)
Has had cardiac cath in the past, no stents On Aspirin, statin and B-blocker F/u with Cardiology

## 2020-11-13 NOTE — Patient Instructions (Signed)
Please continue taking medications as prescribed.  Please continue to follow low salt diet and ambulate as tolerated. 

## 2020-11-18 ENCOUNTER — Other Ambulatory Visit (HOSPITAL_COMMUNITY): Payer: Self-pay

## 2020-11-19 ENCOUNTER — Other Ambulatory Visit: Payer: Self-pay

## 2020-11-19 ENCOUNTER — Other Ambulatory Visit (HOSPITAL_COMMUNITY): Payer: Self-pay | Admitting: Hematology

## 2020-11-19 ENCOUNTER — Inpatient Hospital Stay (HOSPITAL_COMMUNITY): Payer: 59

## 2020-11-19 VITALS — BP 122/76 | HR 75 | Temp 96.6°F | Resp 18 | Wt 227.8 lb

## 2020-11-19 DIAGNOSIS — C9000 Multiple myeloma not having achieved remission: Secondary | ICD-10-CM

## 2020-11-19 DIAGNOSIS — Z5112 Encounter for antineoplastic immunotherapy: Secondary | ICD-10-CM | POA: Diagnosis not present

## 2020-11-19 LAB — CBC WITH DIFFERENTIAL/PLATELET
Abs Immature Granulocytes: 0.04 10*3/uL (ref 0.00–0.07)
Basophils Absolute: 0 10*3/uL (ref 0.0–0.1)
Basophils Relative: 0 %
Eosinophils Absolute: 0.2 10*3/uL (ref 0.0–0.5)
Eosinophils Relative: 4 %
HCT: 39.3 % (ref 39.0–52.0)
Hemoglobin: 12.5 g/dL — ABNORMAL LOW (ref 13.0–17.0)
Immature Granulocytes: 1 %
Lymphocytes Relative: 38 %
Lymphs Abs: 2.2 10*3/uL (ref 0.7–4.0)
MCH: 31.6 pg (ref 26.0–34.0)
MCHC: 31.8 g/dL (ref 30.0–36.0)
MCV: 99.5 fL (ref 80.0–100.0)
Monocytes Absolute: 0.7 10*3/uL (ref 0.1–1.0)
Monocytes Relative: 13 %
Neutro Abs: 2.6 10*3/uL (ref 1.7–7.7)
Neutrophils Relative %: 44 %
Platelets: 237 10*3/uL (ref 150–400)
RBC: 3.95 MIL/uL — ABNORMAL LOW (ref 4.22–5.81)
RDW: 14.4 % (ref 11.5–15.5)
WBC: 5.7 10*3/uL (ref 4.0–10.5)
nRBC: 0 % (ref 0.0–0.2)

## 2020-11-19 LAB — COMPREHENSIVE METABOLIC PANEL
ALT: 20 U/L (ref 0–44)
AST: 22 U/L (ref 15–41)
Albumin: 3.9 g/dL (ref 3.5–5.0)
Alkaline Phosphatase: 79 U/L (ref 38–126)
Anion gap: 8 (ref 5–15)
BUN: 14 mg/dL (ref 8–23)
CO2: 24 mmol/L (ref 22–32)
Calcium: 8.7 mg/dL — ABNORMAL LOW (ref 8.9–10.3)
Chloride: 102 mmol/L (ref 98–111)
Creatinine, Ser: 0.86 mg/dL (ref 0.61–1.24)
GFR, Estimated: >60 mL/min (ref >60)
Glucose, Bld: 129 mg/dL — ABNORMAL HIGH (ref 70–99)
Potassium: 3.4 mmol/L — ABNORMAL LOW (ref 3.5–5.1)
Sodium: 134 mmol/L — ABNORMAL LOW (ref 135–145)
Total Bilirubin: 0.7 mg/dL (ref 0.3–1.2)
Total Protein: 6.9 g/dL (ref 6.5–8.1)

## 2020-11-19 LAB — MAGNESIUM: Magnesium: 1.8 mg/dL (ref 1.7–2.4)

## 2020-11-19 LAB — LACTATE DEHYDROGENASE: LDH: 125 U/L (ref 98–192)

## 2020-11-19 MED ORDER — POTASSIUM CHLORIDE CRYS ER 20 MEQ PO TBCR
40.0000 meq | EXTENDED_RELEASE_TABLET | Freq: Once | ORAL | Status: AC
  Administered 2020-11-19: 40 meq via ORAL
  Filled 2020-11-19: qty 2

## 2020-11-19 MED ORDER — DEXAMETHASONE 4 MG PO TABS
40.0000 mg | ORAL_TABLET | Freq: Once | ORAL | Status: AC
  Administered 2020-11-19: 40 mg via ORAL
  Filled 2020-11-19: qty 10

## 2020-11-19 MED ORDER — DARATUMUMAB-HYALURONIDASE-FIHJ 1800-30000 MG-UT/15ML ~~LOC~~ SOLN
1800.0000 mg | Freq: Once | SUBCUTANEOUS | Status: AC
  Administered 2020-11-19: 1800 mg via SUBCUTANEOUS
  Filled 2020-11-19: qty 15

## 2020-11-19 MED ORDER — DIPHENHYDRAMINE HCL 25 MG PO CAPS
25.0000 mg | ORAL_CAPSULE | Freq: Once | ORAL | Status: AC
  Administered 2020-11-19: 25 mg via ORAL
  Filled 2020-11-19: qty 1

## 2020-11-19 MED ORDER — ACETAMINOPHEN 325 MG PO TABS
650.0000 mg | ORAL_TABLET | Freq: Once | ORAL | Status: AC
  Administered 2020-11-19: 650 mg via ORAL
  Filled 2020-11-19: qty 2

## 2020-11-19 MED ORDER — BORTEZOMIB CHEMO SQ INJECTION 3.5 MG (2.5MG/ML)
1.3000 mg/m2 | Freq: Once | INTRAMUSCULAR | Status: AC
  Administered 2020-11-19: 2.75 mg via SUBCUTANEOUS
  Filled 2020-11-19: qty 1.1

## 2020-11-19 NOTE — Telephone Encounter (Signed)
Chart reviewed. Revlimid refilled per last office note with Dr. Katragadda.  

## 2020-11-19 NOTE — Progress Notes (Signed)
Pt presents today for [place]ara [place] and Velcade injection per provider's order. Vital signs and labs WNL for treatment. Potassium 3.4 per Dr.K's standing orders 36mEq ordered p.o x 1 dose. Okay to proceed with treatment.   Dara [place] and Velcade injection and Potassium 39mEq p.o x 1 dose given today per MD orders. Tolerated infusion without adverse affects. Vital signs stable. No complaints at this time. Discharged from clinic ambulatory in stable condition. Alert and oriented x 3. F/U with [facility] as scheduled.

## 2020-11-19 NOTE — Patient Instructions (Signed)
[name]  Discharge Instructions: Thank you for choosing [facility] to provide your oncology and hematology care.  If you have a lab appointment with the [facility], please come in thru the Main Entrance and check in at the main information desk.  Wear comfortable clothing and clothing appropriate for easy access to any Portacath or PICC line.   We strive to give you quality time with your provider. You may need to reschedule your appointment if you arrive late (15 or more minutes).  Arriving late affects you and other patients whose appointments are after yours.  Also, if you miss three or more appointments without notifying the office, you may be dismissed from the clinic at the provider's discretion.      For prescription refill requests, have your pharmacy contact our office and allow 72 hours for refills to be completed.    Today you received the following chemotherapy and/or immunotherapy agents Eugene [name]   To help prevent nausea and vomiting after your treatment, we encourage you to take your nausea medication as directed.  BELOW ARE SYMPTOMS THAT SHOULD BE REPORTED IMMEDIATELY: *FEVER GREATER THAN 100.4 F (38 C) OR HIGHER *CHILLS OR SWEATING *NAUSEA AND VOMITING THAT IS NOT CONTROLLED WITH YOUR NAUSEA MEDICATION *UNUSUAL SHORTNESS OF BREATH *UNUSUAL BRUISING OR BLEEDING *URINARY PROBLEMS (pain or burning when urinating, or frequent urination) *BOWEL PROBLEMS (unusual diarrhea, constipation, pain near the anus) TENDERNESS IN MOUTH AND THROAT WITH OR WITHOUT PRESENCE OF ULCERS (sore throat, sores in mouth, or a toothache) UNUSUAL RASH, SWELLING OR PAIN  UNUSUAL VAGINAL DISCHARGE OR ITCHING   Items with * indicate a potential emergency and should be followed up as soon as possible or go to the Emergency Department if any problems should occur.  Please show the CHEMOTHERAPY ALERT CARD or IMMUNOTHERAPY ALERT CARD at check-in to the Emergency Department  and triage nurse.  Should you have questions after your visit or need to cancel or reschedule your appointment, please contact [facility] [phone]  and follow the prompts.  Office hours are 8:00 a.m. to 4:30 p.m. Monday - Friday. Please note that voicemails left after 4:00 p.m. may not be returned until the following business day.  We are closed weekends and major holidays. You have access to a nurse at all times for urgent questions. Please call the main number to the clinic [phone] and follow the prompts.  For any non-urgent questions, you may also contact your provider using MyChart. We now offer e-Visits for anyone 61 and older to request care online for non-urgent symptoms. For details visit [email or web address].   Also download the MyChart app! Go to the app store, search "MyChart", open the app, select [facility], and log in with your MyChart username and password.  Due to Covid, a mask is required upon entering the hospital/clinic. If you do not have a mask, one will be given to you upon arrival. For doctor visits, patients may have 1 support person aged 57 or older with them. For treatment visits, patients cannot have anyone with them due to current Covid guidelines and our immunocompromised population.

## 2020-11-20 LAB — KAPPA/LAMBDA LIGHT CHAINS
Kappa free light chain: 24.6 mg/L — ABNORMAL HIGH (ref 3.3–19.4)
Kappa, lambda light chain ratio: 2.39 — ABNORMAL HIGH (ref 0.26–1.65)
Lambda free light chains: 10.3 mg/L (ref 5.7–26.3)

## 2020-11-21 LAB — PROTEIN ELECTROPHORESIS, SERUM
A/G Ratio: 1.2 (ref 0.7–1.7)
Albumin ELP: 3.4 g/dL (ref 2.9–4.4)
Alpha-1-Globulin: 0.3 g/dL (ref 0.0–0.4)
Alpha-2-Globulin: 0.7 g/dL (ref 0.4–1.0)
Beta Globulin: 1 g/dL (ref 0.7–1.3)
Gamma Globulin: 0.9 g/dL (ref 0.4–1.8)
Globulin, Total: 2.8 g/dL (ref 2.2–3.9)
M-Spike, %: 0.4 g/dL — ABNORMAL HIGH
Total Protein ELP: 6.2 g/dL (ref 6.0–8.5)

## 2020-11-25 NOTE — Progress Notes (Signed)
[facility] [address], [place] [address]   CLINIC:  Medical Oncology/Hematology  PCP:  Anabel Halon, MD [address] / [place] [address] [phone]   REASON FOR VISIT:  Follow-up for multiple myeloma  PRIOR THERAPY: none  NGS Results: not done  CURRENT THERAPY: DaraVRd every 3 weeks x 6 cycles  BRIEF ONCOLOGIC HISTORY:  Oncology History  Multiple myeloma without remission (HCC)  07/09/2020 Pathology Results   BONE MARROW, ASPIRATE, CLOT, CORE:  -Hypercellular bone marrow with plasma cell neoplasm  -See comment   PERIPHERAL BLOOD:  -Slight macrocytic anemia   COMMENT:   The bone marrow is hypercellular for age with trilineage hematopoiesis and nonspecific myeloid changes.  In this background, the plasma cells are increased in number representing 10% of all cells associated with atypical cytomorphologic features.  The plasma cells display kappa light chain restriction consistent with plasma cell neoplasm.  Correlation with cytogenetic and FISH studies is recommended.    07/24/2020 Initial Diagnosis   Multiple myeloma without remission (HCC)   07/24/2020 Cancer Staging   Staging form: Plasma Cell Myeloma and Plasma Cell Disorders, AJCC 8th Edition - Clinical stage from 07/24/2020: RISS Stage I (Beta-2-microglobulin (mg/L): 2.1, Albumin (g/dL): 3.7, ISS: Stage I, High-risk cytogenetics: Absent, LDH: Normal) - Signed by Artis Delay, MD on 07/24/2020 Stage prefix: Initial diagnosis Beta 2 microglobulin range (mg/L): Less than 3.5 Albumin range (g/dL): Greater than or equal to 3.5 Cytogenetics: No abnormalities    09/03/2020 -  Chemotherapy   Patient is on Treatment Plan : MYELOMA NEWLY DIAGNOSED TRANSPLANT CANDIDATE DaraVRd (Daratumumab SQ) q21d x 6 Cycles (Induction/Consolidation)       CANCER STAGING: Cancer Staging Multiple myeloma without remission (HCC) Staging form: Plasma Cell Myeloma and Plasma Cell Disorders, AJCC 8th  Edition - Clinical stage from 07/24/2020: RISS Stage I (Beta-2-microglobulin (mg/L): 2.1, Albumin (g/dL): 3.7, ISS: Stage I, High-risk cytogenetics: Absent, LDH: Normal) - Signed by Artis Delay, MD on 07/24/2020   INTERVAL HISTORY:  [name], a 65 y.o. male, returns for routine follow-up and consideration for next cycle of chemotherapy. Eugene [name] was last seen on 11/05/2020.  Due for cycle #5 of DaraVRd today.   Overall, he tells me he has been feeling pretty well. He reports mild back pain and easy fatigue with exertion. He denies tingling/numbness, n/v/d, and jaw pain. He reports mild constipation.   Overall, he feels ready for next cycle of chemo today.   REVIEW OF SYSTEMS:  Review of Systems  Constitutional:  Positive for fatigue. Negative for appetite change.  Gastrointestinal:  Positive for constipation. Negative for diarrhea, nausea and vomiting.  Musculoskeletal:  Positive for back pain.  Neurological:  Negative for numbness.  All other systems reviewed and are negative.  PAST MEDICAL/SURGICAL HISTORY:  Past Medical History:  Diagnosis Date   Back pain    CAD (coronary artery disease)    a. 01/2017 NSTEMI/Cath: LM nl, LAD 25p - ? hypodense but no filling defect, RI nl, LCX nl, RCA nl, EF 55-65%-->Med Rx.   Erectile dysfunction    History of echocardiogram    a. 01/2017 Echo: EF 55-60%, no rwma.   Hypertension    Past Surgical History:  Procedure Laterality Date   LEFT HEART CATH AND CORONARY ANGIOGRAPHY N/A 01/15/2017   Procedure: LEFT HEART CATH AND CORONARY ANGIOGRAPHY;  Surgeon: Swaz[place], Peter M, MD;  Location: [facility] INVASIVE CV LAB;  Service: Cardiovascular;  Laterality: N/A;   ORIF trocanteric femoral IM Nail  Right     SOCIAL HISTORY:  Social History   Socioeconomic History   Marital status: Significant Other    Spouse name: Not on file   Number of children: Not on file   Years of education: Not on file   Highest education level: Not on file  Occupational  History   Not on file  Tobacco Use   Smoking status: Never   Smokeless tobacco: Never  Vaping Use   Vaping Use: Never used  Substance and Sexual Activity   Alcohol use: No   Drug use: No   Sexual activity: Yes  Other Topics Concern   Not on file  Social History Narrative   Not on file   Social Determinants of Health   Financial Resource Strain: Low Risk    Difficulty of Paying Living Expenses: Not hard at all  Food Insecurity: No Food Insecurity   Worried About Charity fundraiser in the Last Year: Never true   [place] in the Last Year: Never true  Transportation Needs: No Transportation Needs   Lack of Transportation (Medical): No   Lack of Transportation (Non-Medical): No  Physical Activity: Insufficiently Active   Days of Exercise per Week: 2 days   Minutes of Exercise per Session: 10 min  Stress: No Stress Concern Present   Feeling of Stress : Not at all  Social Connections: Moderately Integrated   Frequency of Communication with Friends and Family: More than three times a week   Frequency of Social Gatherings with Friends and Family: More than three times a week   Attends Religious Services: 1 to 4 times per year   Active Member of [employer] or Organizations: No   Attends Music therapist: 1 to 4 times per year   Marital Status: Divorced  Human resources officer Violence: Not At Risk   Fear of Current or Ex-Partner: No   Emotionally Abused: No   Physically Abused: No   Sexually Abused: No    FAMILY HISTORY:  Family History  Problem Relation Age of Onset   Cancer Mother        breast   Diabetes Mother    Diabetes Sister    Hypertension Sister    Cancer Brother    Mental illness Sister     CURRENT MEDICATIONS:  Current Outpatient Medications  Medication Sig Dispense Refill   acyclovir (ZOVIRAX) 400 MG tablet Take 1 tablet (400 mg total) by mouth 2 (two) times daily. 60 tablet 5   aspirin EC 81 MG tablet Take 1 tablet (81 mg total) by mouth  daily with breakfast. 30 tablet 5   BORTEZOMIB IJ Inject as directed once a week.     calcium-vitamin D (OSCAL WITH D) 500-200 MG-UNIT tablet Take 1 tablet by mouth.     DARATUMUMAB-HYALURONIDASE-FIHJ [place] Inject into the skin once a week.     dexamethasone (DECADRON) 4 MG tablet Take 10 tablets (40 mg total) by mouth once a week. 40 tablet 6   metoprolol succinate (TOPROL-XL) 25 MG 24 hr tablet TAKE 1 TABLET BY MOUTH DAILY. (Patient taking differently: Take 25 mg by mouth daily.) 90 tablet 3   Multiple Vitamin (MULTIVITAMIN) tablet Take 1 tablet by mouth daily.       REVLIMID 25 MG capsule TAKE 1 CAPSULE BY MOUTH ONCE DAILY FOR 14 DAYS ON AND 7 DAYS OFF 14 capsule 0   rosuvastatin (CRESTOR) 40 MG tablet Take 1 tablet (40 mg total) by mouth every other day. Eugene [name]  tablet 1   traMADol (ULTRAM) 50 MG tablet Take 1 tablet (50 mg total) by mouth every 6 (six) hours as needed. 60 tablet 0   nitroGLYCERIN (NITROSTAT) 0.4 MG SL tablet Place 1 tablet (0.4 mg total) under the tongue every 5 (five) minutes for 3 doses as needed for chest pain. (Patient not taking: Reported on 11/26/2020) 25 tablet 3   No current facility-administered medications for this visit.    ALLERGIES:  No Known Allergies  PHYSICAL EXAM:  Performance status (ECOG): 1 - Symptomatic but completely ambulatory  Vitals:   11/26/20 0949  BP: (!) 149/80  Pulse: 67  Resp: 16  Temp: 98.9 F (37.2 C)  SpO2: 100%   Wt Readings from Last 3 Encounters:  11/26/20 228 lb 12.8 oz (103.8 kg)  11/19/20 227 lb 12.8 oz (103.3 kg)  11/13/20 223 lb 0.6 oz (101.2 kg)   Physical Exam Vitals reviewed.  Constitutional:      Appearance: Normal appearance. He is obese.  Cardiovascular:     Rate and Rhythm: Normal rate and regular rhythm.     Pulses: Normal pulses.     Heart sounds: Normal heart sounds.  Pulmonary:     Effort: Pulmonary effort is normal.     Breath sounds: Normal breath sounds.  Musculoskeletal:     Right lower leg: Edema  (trace) present.     Left lower leg: Edema (trace) present.  Neurological:     General: No focal deficit present.     Mental Status: He is alert and oriented to person, place, and time.  Psychiatric:        Mood and Affect: Mood normal.        Behavior: Behavior normal.    LABORATORY DATA:  I have reviewed the labs as listed.  CBC Latest Ref Rng & Units 11/26/2020 11/19/2020 11/12/2020  WBC 4.0 - 10.5 K/uL 5.2 5.7 5.1  Hemoglobin 13.0 - 17.0 g/dL 12.7(L) 12.5(L) 13.1  Hematocrit 39.0 - 52.0 % 39.3 39.3 39.1  Platelets 150 - 400 K/uL 204 237 217   CMP Latest Ref Rng & Units 11/26/2020 11/19/2020 11/12/2020  Glucose 70 - 99 mg/dL 129(H) 129(H) 130(H)  BUN 8 - 23 mg/dL $Remove'14 14 10  'uEFzIYU$ Creatinine 0.61 - 1.24 mg/dL 0.92 0.86 0.96  Sodium 135 - 145 mmol/L 136 134(L) 136  Potassium 3.5 - 5.1 mmol/L 3.5 3.4(L) 3.6  Chloride 98 - 111 mmol/L 103 102 102  CO2 22 - 32 mmol/L $RemoveB'25 24 26  'rUuFMBes$ Calcium 8.9 - 10.3 mg/dL 8.7(L) 8.7(L) 9.1  Total Protein 6.5 - 8.1 g/dL 7.1 6.9 7.4  Total Bilirubin 0.3 - 1.2 mg/dL 0.8 0.7 0.9  Alkaline Phos 38 - 126 U/L 69 79 73  AST 15 - 41 U/L $Remo'21 22 20  'RHHUA$ ALT 0 - 44 U/L $Remo'18 20 16    'XgmvC$ DIAGNOSTIC IMAGING:  I have independently reviewed the scans and discussed with the patient. No results found.   ASSESSMENT:  1.  Standard risk plasma cell myeloma: - He reported low back pain radiating to the right thigh since April, pain gets worse when he walks for more than 2 blocks. - He had history of MGUS and was last seen in our clinic in 2019.  He had an abnormal M spike of 3.2 g on 12/06/2018. - Skeletal survey on 10/22/2017 showed no suspicious lytic lesions. - X-ray of the right sided pelvis on 06/11/2020 showed prominent lytic lesions noted in the proximal midportion of the right femoral diaphysis with  no evidence of fracture or dislocation. - Bone marrow biopsy on 07/09/2020 with hypercellular marrow with trilineage hematopoiesis.  Plasma cells representing 10% of cells. - Chromosome  analysis 46, XY (20).  Multiple myeloma FISH panel negative. - PET scan on 07/04/2020 with multiple hypermetabolic bony soft tissue lesions.  Dominant lesions in the L5 vertebral body and posterior right acetabulum and distal right femur.  Focal increased uptake in the right tonsillar region with associated soft tissue fullness on CT imaging. - 24-hour urine with total protein 134. - Beta-2 microglobulin 2.1 (06/26/2020), LDH 136. - Dara RVD cycle 1 started on 09/03/2020.  2.  Social/family history: - He works at [employer] in environmental services. - He was never smoker. - 2 brothers died of metastatic lung cancer.  Sister has thyroid cancer.  Mother had breast cancer.   PLAN:  1.  Stage I standard risk IgG kappa multiple myeloma: - He completed 4 cycles of Dara RVD. - Reviewed myeloma labs from 11/19/2020.  M spike improved to 0.4 from 2.9 g.  Free light chain ratio is 2.39 with kappa light chains 24.6 slight improvement. - We will continue Revlimid 25 mg 2 weeks on/1 week off.  He has mild constipation which is controlled well with prune juice and stool softeners. - Reviewed labs from today which showed normal CBC and LFTs.  Creatinine was normal.  We will proceed with cycle 5 today. - I have communicated with [facility] Aris [facility] at [facility].  We will do stem cell transplant at first relapse.  We will plan to collect stem cells when his M spike is close to 0.  Hence we will proceed with cycle 5 today. 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 - We will plan to repeat myeloma labs in 3 weeks and RTC 4 weeks.   2.  Right mid thigh pain: - He had right femur ORIF in July 2022.  He could not receive radiation while he was started on myeloma therapy. - He does not report any pain at this time.  3.  Infection prophylaxis: - Continue acyclovir 4 mg twice daily.  Continue aspirin 81 mg daily.  4.  Myeloma bone disease: - Continue calcium supplements.   Calcium is 8.7. - Continue Zometa every 4 weeks.   Orders placed this encounter:  No orders of the defined types were placed in this encounter.    Derek Jack, MD [place] [phone]   I, Thana Ates, am acting as a scribe for Dr. Derek Jack.  I, Derek Jack MD, have reviewed the above documentation for accuracy and completeness, and I agree with the above.

## 2020-11-26 ENCOUNTER — Inpatient Hospital Stay (HOSPITAL_COMMUNITY): Payer: 59

## 2020-11-26 ENCOUNTER — Inpatient Hospital Stay (HOSPITAL_COMMUNITY): Payer: 59 | Admitting: Hematology

## 2020-11-26 ENCOUNTER — Other Ambulatory Visit: Payer: Self-pay

## 2020-11-26 VITALS — BP 149/80 | HR 67 | Temp 98.9°F | Resp 16 | Wt 228.8 lb

## 2020-11-26 DIAGNOSIS — D472 Monoclonal gammopathy: Secondary | ICD-10-CM

## 2020-11-26 DIAGNOSIS — C9000 Multiple myeloma not having achieved remission: Secondary | ICD-10-CM

## 2020-11-26 DIAGNOSIS — Z5112 Encounter for antineoplastic immunotherapy: Secondary | ICD-10-CM | POA: Diagnosis not present

## 2020-11-26 DIAGNOSIS — M899 Disorder of bone, unspecified: Secondary | ICD-10-CM

## 2020-11-26 LAB — COMPREHENSIVE METABOLIC PANEL
ALT: 18 U/L (ref 0–44)
AST: 21 U/L (ref 15–41)
Albumin: 3.9 g/dL (ref 3.5–5.0)
Alkaline Phosphatase: 69 U/L (ref 38–126)
Anion gap: 8 (ref 5–15)
BUN: 14 mg/dL (ref 8–23)
CO2: 25 mmol/L (ref 22–32)
Calcium: 8.7 mg/dL — ABNORMAL LOW (ref 8.9–10.3)
Chloride: 103 mmol/L (ref 98–111)
Creatinine, Ser: 0.92 mg/dL (ref 0.61–1.24)
GFR, Estimated: >60 mL/min (ref >60)
Glucose, Bld: 129 mg/dL — ABNORMAL HIGH (ref 70–99)
Potassium: 3.5 mmol/L (ref 3.5–5.1)
Sodium: 136 mmol/L (ref 135–145)
Total Bilirubin: 0.8 mg/dL (ref 0.3–1.2)
Total Protein: 7.1 g/dL (ref 6.5–8.1)

## 2020-11-26 LAB — CBC WITH DIFFERENTIAL/PLATELET
Abs Immature Granulocytes: 0.01 10*3/uL (ref 0.00–0.07)
Basophils Absolute: 0 10*3/uL (ref 0.0–0.1)
Basophils Relative: 0 %
Eosinophils Absolute: 0.1 10*3/uL (ref 0.0–0.5)
Eosinophils Relative: 2 %
HCT: 39.3 % (ref 39.0–52.0)
Hemoglobin: 12.7 g/dL — ABNORMAL LOW (ref 13.0–17.0)
Immature Granulocytes: 0 %
Lymphocytes Relative: 51 %
Lymphs Abs: 2.7 10*3/uL (ref 0.7–4.0)
MCH: 31.8 pg (ref 26.0–34.0)
MCHC: 32.3 g/dL (ref 30.0–36.0)
MCV: 98.5 fL (ref 80.0–100.0)
Monocytes Absolute: 0.6 10*3/uL (ref 0.1–1.0)
Monocytes Relative: 12 %
Neutro Abs: 1.9 10*3/uL (ref 1.7–7.7)
Neutrophils Relative %: 35 %
Platelets: 204 10*3/uL (ref 150–400)
RBC: 3.99 MIL/uL — ABNORMAL LOW (ref 4.22–5.81)
RDW: 14.6 % (ref 11.5–15.5)
WBC: 5.2 10*3/uL (ref 4.0–10.5)
nRBC: 0 % (ref 0.0–0.2)

## 2020-11-26 LAB — LACTATE DEHYDROGENASE: LDH: 129 U/L (ref 98–192)

## 2020-11-26 LAB — MAGNESIUM: Magnesium: 2 mg/dL (ref 1.7–2.4)

## 2020-11-26 MED ORDER — DEXAMETHASONE 4 MG PO TABS
40.0000 mg | ORAL_TABLET | Freq: Once | ORAL | Status: AC
  Administered 2020-11-26: 40 mg via ORAL
  Filled 2020-11-26: qty 10

## 2020-11-26 MED ORDER — ACETAMINOPHEN 325 MG PO TABS
650.0000 mg | ORAL_TABLET | Freq: Once | ORAL | Status: AC
  Administered 2020-11-26: 650 mg via ORAL
  Filled 2020-11-26: qty 2

## 2020-11-26 MED ORDER — DIPHENHYDRAMINE HCL 25 MG PO CAPS
25.0000 mg | ORAL_CAPSULE | Freq: Once | ORAL | Status: AC
  Administered 2020-11-26: 25 mg via ORAL
  Filled 2020-11-26: qty 1

## 2020-11-26 MED ORDER — DARATUMUMAB-HYALURONIDASE-FIHJ 1800-30000 MG-UT/15ML ~~LOC~~ SOLN
1800.0000 mg | Freq: Once | SUBCUTANEOUS | Status: AC
  Administered 2020-11-26: 1800 mg via SUBCUTANEOUS
  Filled 2020-11-26: qty 15

## 2020-11-26 MED ORDER — BORTEZOMIB CHEMO SQ INJECTION 3.5 MG (2.5MG/ML)
1.3000 mg/m2 | Freq: Once | INTRAMUSCULAR | Status: AC
  Administered 2020-11-26: 2.75 mg via SUBCUTANEOUS
  Filled 2020-11-26: qty 1.1

## 2020-11-26 NOTE — Progress Notes (Signed)
Treatment given per orders. Patient tolerated it well without problems. Vitals stable and discharged home from clinic ambulatory. Follow up as scheduled.  

## 2020-11-26 NOTE — Patient Instructions (Signed)
[facility]  Discharge Instructions: Thank you for choosing [facility] to provide your oncology and hematology care.  If you have a lab appointment with the Cancer Center, please come in thru the Main Entrance and check in at the main information desk.  Wear comfortable clothing and clothing appropriate for easy access to any Portacath or PICC line.   We strive to give you quality time with your provider. You may need to reschedule your appointment if you arrive late (15 or more minutes).  Arriving late affects you and other patients whose appointments are after yours.  Also, if you miss three or more appointments without notifying the office, you may be dismissed from the clinic at the provider's discretion.      For prescription refill requests, have your pharmacy contact our office and allow 72 hours for refills to be completed.        To help prevent nausea and vomiting after your treatment, we encourage you to take your nausea medication as directed.  BELOW ARE SYMPTOMS THAT SHOULD BE REPORTED IMMEDIATELY: *FEVER GREATER THAN 100.4 F (38 C) OR HIGHER *CHILLS OR SWEATING *NAUSEA AND VOMITING THAT IS NOT CONTROLLED WITH YOUR NAUSEA MEDICATION *UNUSUAL SHORTNESS OF BREATH *UNUSUAL BRUISING OR BLEEDING *URINARY PROBLEMS (pain or burning when urinating, or frequent urination) *BOWEL PROBLEMS (unusual diarrhea, constipation, pain near the anus) TENDERNESS IN MOUTH AND THROAT WITH OR WITHOUT PRESENCE OF ULCERS (sore throat, sores in mouth, or a toothache) UNUSUAL RASH, SWELLING OR PAIN  UNUSUAL VAGINAL DISCHARGE OR ITCHING   Items with * indicate a potential emergency and should be followed up as soon as possible or go to the Emergency Department if any problems should occur.  Please show the CHEMOTHERAPY ALERT CARD or IMMUNOTHERAPY ALERT CARD at check-in to the Emergency Department and triage nurse.  Should you have questions after your visit or need to cancel  or reschedule your appointment, please contact [facility] [phone]  and follow the prompts.  Office hours are 8:00 a.m. to 4:30 p.m. Monday - Friday. Please note that voicemails left after 4:00 p.m. may not be returned until the following business day.  We are closed weekends and major holidays. You have access to a nurse at all times for urgent questions. Please call the main number to the clinic [phone] and follow the prompts.  For any non-urgent questions, you may also contact your provider using MyChart. We now offer e-Visits for anyone 18 and older to request care online for non-urgent symptoms. For details visit [email or web address].   Also download the MyChart app! Go to the app store, search "MyChart", open the app, select [place], and log in with your MyChart username and password.  Due to Covid, a mask is required upon entering the hospital/clinic. If you do not have a mask, one will be given to you upon arrival. For doctor visits, patients may have 1 support person aged 18 or older with them. For treatment visits, patients cannot have anyone with them due to current Covid guidelines and our immunocompromised population.  

## 2020-11-26 NOTE — Progress Notes (Signed)
Patient has been examined, vital signs and labs have been reviewed by Dr. Katragadda. ANC, Creatinine, LFTs, hemoglobin, and platelets are within treatment parameters per Dr. Katragadda. Patient may proceed with treatment per M.D.   

## 2020-11-26 NOTE — Patient Instructions (Signed)
[place] [facility] Discharge Instructions  You were seen and examined today by Dr. Delton Coombes. He reviewed your meyloma lab work, all of which is improving. You will proceed with treatment today. Return as scheduled for labs and injection. Return as scheduled for office visit.    Thank you for choosing [place] at [facility] to provide your oncology and hematology care.  To afford each patient quality time with our provider, please arrive at least 15 minutes before your scheduled appointment time.   If you have a lab appointment with the [facility] please come in thru the Main Entrance and check in at the main information desk.  You need to re-schedule your appointment should you arrive 10 or more minutes late.  We strive to give you quality time with our providers, and arriving late affects you and other patients whose appointments are after yours.  Also, if you no show three or more times for appointments you may be dismissed from the clinic at the providers discretion.     Again, thank you for choosing [facility].  Our hope is that these requests will decrease the amount of time that you wait before being seen by our physicians.       _____________________________________________________________  Should you have questions after your visit to [facility], please contact our office at [phone] and follow the prompts.  Our office hours are 8:00 a.m. and 4:30 p.m. Monday - Friday.  Please note that voicemails left after 4:00 p.m. may not be returned until the following business day.  We are closed weekends and major holidays.  You do have access to a nurse 24-7, just call the main number to the clinic [phone] and do not press any options, hold on the line and a nurse will answer the phone.    For prescription refill requests, have your pharmacy contact our office and allow 72 hours.    Due to Covid,  you will need to wear a mask upon entering the hospital. If you do not have a mask, a mask will be given to you at the Main Entrance upon arrival. For doctor visits, patients may have 1 support person age 15 or older with them. For treatment visits, patients can not have anyone with them due to social distancing guidelines and our immunocompromised population.

## 2020-11-28 DIAGNOSIS — Z1211 Encounter for screening for malignant neoplasm of colon: Secondary | ICD-10-CM | POA: Diagnosis not present

## 2020-12-03 ENCOUNTER — Other Ambulatory Visit: Payer: Self-pay

## 2020-12-03 ENCOUNTER — Inpatient Hospital Stay (HOSPITAL_COMMUNITY): Payer: 59

## 2020-12-03 ENCOUNTER — Encounter: Payer: Self-pay | Admitting: *Deleted

## 2020-12-03 VITALS — BP 123/74 | HR 68 | Temp 97.0°F | Resp 20 | Wt 226.0 lb

## 2020-12-03 DIAGNOSIS — C9000 Multiple myeloma not having achieved remission: Secondary | ICD-10-CM | POA: Diagnosis not present

## 2020-12-03 DIAGNOSIS — Z5112 Encounter for antineoplastic immunotherapy: Secondary | ICD-10-CM | POA: Diagnosis not present

## 2020-12-03 DIAGNOSIS — E876 Hypokalemia: Secondary | ICD-10-CM

## 2020-12-03 LAB — COMPREHENSIVE METABOLIC PANEL
ALT: 18 U/L (ref 0–44)
AST: 21 U/L (ref 15–41)
Albumin: 3.9 g/dL (ref 3.5–5.0)
Alkaline Phosphatase: 62 U/L (ref 38–126)
Anion gap: 9 (ref 5–15)
BUN: 9 mg/dL (ref 8–23)
CO2: 28 mmol/L (ref 22–32)
Calcium: 8.8 mg/dL — ABNORMAL LOW (ref 8.9–10.3)
Chloride: 102 mmol/L (ref 98–111)
Creatinine, Ser: 0.97 mg/dL (ref 0.61–1.24)
GFR, Estimated: >60 mL/min (ref >60)
Glucose, Bld: 126 mg/dL — ABNORMAL HIGH (ref 70–99)
Potassium: 3.3 mmol/L — ABNORMAL LOW (ref 3.5–5.1)
Sodium: 139 mmol/L (ref 135–145)
Total Bilirubin: 0.9 mg/dL (ref 0.3–1.2)
Total Protein: 6.9 g/dL (ref 6.5–8.1)

## 2020-12-03 LAB — CBC WITH DIFFERENTIAL/PLATELET
Abs Immature Granulocytes: 0.02 10*3/uL (ref 0.00–0.07)
Basophils Absolute: 0 10*3/uL (ref 0.0–0.1)
Basophils Relative: 0 %
Eosinophils Absolute: 0.3 10*3/uL (ref 0.0–0.5)
Eosinophils Relative: 6 %
HCT: 38.9 % — ABNORMAL LOW (ref 39.0–52.0)
Hemoglobin: 12.8 g/dL — ABNORMAL LOW (ref 13.0–17.0)
Immature Granulocytes: 0 %
Lymphocytes Relative: 54 %
Lymphs Abs: 2.7 10*3/uL (ref 0.7–4.0)
MCH: 32.2 pg (ref 26.0–34.0)
MCHC: 32.9 g/dL (ref 30.0–36.0)
MCV: 98 fL (ref 80.0–100.0)
Monocytes Absolute: 0.5 10*3/uL (ref 0.1–1.0)
Monocytes Relative: 11 %
Neutro Abs: 1.4 10*3/uL — ABNORMAL LOW (ref 1.7–7.7)
Neutrophils Relative %: 29 %
Platelets: 191 10*3/uL (ref 150–400)
RBC: 3.97 MIL/uL — ABNORMAL LOW (ref 4.22–5.81)
RDW: 14.9 % (ref 11.5–15.5)
WBC: 5 10*3/uL (ref 4.0–10.5)
nRBC: 0 % (ref 0.0–0.2)

## 2020-12-03 LAB — MAGNESIUM: Magnesium: 1.9 mg/dL (ref 1.7–2.4)

## 2020-12-03 MED ORDER — DEXAMETHASONE 4 MG PO TABS
40.0000 mg | ORAL_TABLET | Freq: Once | ORAL | Status: AC
  Administered 2020-12-03: 40 mg via ORAL
  Filled 2020-12-03: qty 10

## 2020-12-03 MED ORDER — BORTEZOMIB CHEMO SQ INJECTION 3.5 MG (2.5MG/ML)
1.3000 mg/m2 | Freq: Once | INTRAMUSCULAR | Status: AC
  Administered 2020-12-03: 2.75 mg via SUBCUTANEOUS
  Filled 2020-12-03: qty 1.1

## 2020-12-03 MED ORDER — PROCHLORPERAZINE MALEATE 10 MG PO TABS
10.0000 mg | ORAL_TABLET | Freq: Once | ORAL | Status: AC
  Administered 2020-12-03: 10 mg via ORAL
  Filled 2020-12-03: qty 1

## 2020-12-03 MED ORDER — POTASSIUM CHLORIDE CRYS ER 20 MEQ PO TBCR
40.0000 meq | EXTENDED_RELEASE_TABLET | Freq: Once | ORAL | Status: AC
  Administered 2020-12-03: 40 meq via ORAL
  Filled 2020-12-03: qty 2

## 2020-12-03 NOTE — Progress Notes (Signed)
Per Dr. Benay Spice, it is okay to give Velcade today with an Paia of 1.4. This was communicated over the phone to Reino Kent, treating RN at [facility]. She verbalized understanding.

## 2020-12-03 NOTE — Patient Instructions (Signed)
[facility]  Discharge Instructions: Thank you for choosing [facility] to provide your oncology and hematology care.  If you have a lab appointment with the Cancer Center, please come in thru the Main Entrance and check in at the main information desk.  Wear comfortable clothing and clothing appropriate for easy access to any Portacath or PICC line.   We strive to give you quality time with your provider. You may need to reschedule your appointment if you arrive late (15 or more minutes).  Arriving late affects you and other patients whose appointments are after yours.  Also, if you miss three or more appointments without notifying the office, you may be dismissed from the clinic at the provider's discretion.      For prescription refill requests, have your pharmacy contact our office and allow 72 hours for refills to be completed.        To help prevent nausea and vomiting after your treatment, we encourage you to take your nausea medication as directed.  BELOW ARE SYMPTOMS THAT SHOULD BE REPORTED IMMEDIATELY: *FEVER GREATER THAN 100.4 F (38 C) OR HIGHER *CHILLS OR SWEATING *NAUSEA AND VOMITING THAT IS NOT CONTROLLED WITH YOUR NAUSEA MEDICATION *UNUSUAL SHORTNESS OF BREATH *UNUSUAL BRUISING OR BLEEDING *URINARY PROBLEMS (pain or burning when urinating, or frequent urination) *BOWEL PROBLEMS (unusual diarrhea, constipation, pain near the anus) TENDERNESS IN MOUTH AND THROAT WITH OR WITHOUT PRESENCE OF ULCERS (sore throat, sores in mouth, or a toothache) UNUSUAL RASH, SWELLING OR PAIN  UNUSUAL VAGINAL DISCHARGE OR ITCHING   Items with * indicate a potential emergency and should be followed up as soon as possible or go to the Emergency Department if any problems should occur.  Please show the CHEMOTHERAPY ALERT CARD or IMMUNOTHERAPY ALERT CARD at check-in to the Emergency Department and triage nurse.  Should you have questions after your visit or need to cancel  or reschedule your appointment, please contact  CANCER CENTER [phone]  and follow the prompts.  Office hours are 8:00 a.m. to 4:30 p.m. Monday - Friday. Please note that voicemails left after 4:00 p.m. may not be returned until the following business day.  We are closed weekends and major holidays. You have access to a nurse at all times for urgent questions. Please call the main number to the clinic [phone] and follow the prompts.  For any non-urgent questions, you may also contact your provider using MyChart. We now offer e-Visits for anyone 18 and older to request care online for non-urgent symptoms. For details visit [email or web address].   Also download the MyChart app! Go to the app store, search "MyChart", open the app, select , and log in with your MyChart username and password.  Due to Covid, a mask is required upon entering the hospital/clinic. If you do not have a mask, one will be given to you upon arrival. For doctor visits, patients may have 1 support person aged 18 or older with them. For treatment visits, patients cannot have anyone with them due to current Covid guidelines and our immunocompromised population.  

## 2020-12-03 NOTE — Progress Notes (Signed)
Patient presents today for Velcade injection.  Patient is in satisfactory condition with no new complaints voiced.  Vital signs are stable.  ANC today is 1.4.  Treatment parameters are > 1.5.  Dr. Benay Spice notified.    Ok to proceed with Velcade injection per Dr. Benay Spice.  Patient advised to contact the office with any fever.  Patient verbalized understanding.  Patient tolerated injection with no complaints voiced.  Site clean and dry with no bruising or swelling noted.  No complaints of pain.  Discharged with vital signs stable and no signs or symptoms of distress noted.

## 2020-12-06 LAB — COLOGUARD: COLOGUARD: NEGATIVE

## 2020-12-09 NOTE — Progress Notes (Signed)
Pt advised with verbal understanding  °

## 2020-12-10 ENCOUNTER — Encounter (HOSPITAL_COMMUNITY): Payer: Self-pay

## 2020-12-10 ENCOUNTER — Inpatient Hospital Stay (HOSPITAL_COMMUNITY): Payer: 59

## 2020-12-10 ENCOUNTER — Other Ambulatory Visit (HOSPITAL_COMMUNITY): Payer: Self-pay | Admitting: Hematology

## 2020-12-10 ENCOUNTER — Other Ambulatory Visit: Payer: Self-pay

## 2020-12-10 VITALS — BP 125/64 | HR 64 | Temp 96.6°F | Resp 20 | Wt 230.8 lb

## 2020-12-10 DIAGNOSIS — C9000 Multiple myeloma not having achieved remission: Secondary | ICD-10-CM

## 2020-12-10 DIAGNOSIS — Z5112 Encounter for antineoplastic immunotherapy: Secondary | ICD-10-CM | POA: Diagnosis not present

## 2020-12-10 DIAGNOSIS — E876 Hypokalemia: Secondary | ICD-10-CM

## 2020-12-10 LAB — CBC WITH DIFFERENTIAL/PLATELET
Abs Immature Granulocytes: 0.02 10*3/uL (ref 0.00–0.07)
Basophils Absolute: 0 10*3/uL (ref 0.0–0.1)
Basophils Relative: 1 %
Eosinophils Absolute: 0.2 10*3/uL (ref 0.0–0.5)
Eosinophils Relative: 3 %
HCT: 36.7 % — ABNORMAL LOW (ref 39.0–52.0)
Hemoglobin: 12.3 g/dL — ABNORMAL LOW (ref 13.0–17.0)
Immature Granulocytes: 0 %
Lymphocytes Relative: 40 %
Lymphs Abs: 2.5 10*3/uL (ref 0.7–4.0)
MCH: 33 pg (ref 26.0–34.0)
MCHC: 33.5 g/dL (ref 30.0–36.0)
MCV: 98.4 fL (ref 80.0–100.0)
Monocytes Absolute: 0.9 10*3/uL (ref 0.1–1.0)
Monocytes Relative: 15 %
Neutro Abs: 2.5 10*3/uL (ref 1.7–7.7)
Neutrophils Relative %: 41 %
Platelets: 215 10*3/uL (ref 150–400)
RBC: 3.73 MIL/uL — ABNORMAL LOW (ref 4.22–5.81)
RDW: 14.7 % (ref 11.5–15.5)
WBC: 6.1 10*3/uL (ref 4.0–10.5)
nRBC: 0 % (ref 0.0–0.2)

## 2020-12-10 LAB — COMPREHENSIVE METABOLIC PANEL
ALT: 18 U/L (ref 0–44)
AST: 22 U/L (ref 15–41)
Albumin: 3.7 g/dL (ref 3.5–5.0)
Alkaline Phosphatase: 57 U/L (ref 38–126)
Anion gap: 6 (ref 5–15)
BUN: 11 mg/dL (ref 8–23)
CO2: 28 mmol/L (ref 22–32)
Calcium: 8.3 mg/dL — ABNORMAL LOW (ref 8.9–10.3)
Chloride: 102 mmol/L (ref 98–111)
Creatinine, Ser: 1.03 mg/dL (ref 0.61–1.24)
GFR, Estimated: >60 mL/min (ref >60)
Glucose, Bld: 107 mg/dL — ABNORMAL HIGH (ref 70–99)
Potassium: 3.3 mmol/L — ABNORMAL LOW (ref 3.5–5.1)
Sodium: 136 mmol/L (ref 135–145)
Total Bilirubin: 0.5 mg/dL (ref 0.3–1.2)
Total Protein: 6.5 g/dL (ref 6.5–8.1)

## 2020-12-10 LAB — MAGNESIUM: Magnesium: 1.8 mg/dL (ref 1.7–2.4)

## 2020-12-10 MED ORDER — DEXAMETHASONE 4 MG PO TABS
40.0000 mg | ORAL_TABLET | Freq: Once | ORAL | Status: AC
  Administered 2020-12-10: 40 mg via ORAL

## 2020-12-10 MED ORDER — DARATUMUMAB-HYALURONIDASE-FIHJ 1800-30000 MG-UT/15ML ~~LOC~~ SOLN
1800.0000 mg | Freq: Once | SUBCUTANEOUS | Status: AC
  Administered 2020-12-10: 1800 mg via SUBCUTANEOUS
  Filled 2020-12-10: qty 15

## 2020-12-10 MED ORDER — BORTEZOMIB CHEMO SQ INJECTION 3.5 MG (2.5MG/ML)
1.3000 mg/m2 | Freq: Once | INTRAMUSCULAR | Status: AC
  Administered 2020-12-10: 2.75 mg via SUBCUTANEOUS
  Filled 2020-12-10: qty 1.1

## 2020-12-10 MED ORDER — DIPHENHYDRAMINE HCL 25 MG PO CAPS
25.0000 mg | ORAL_CAPSULE | Freq: Once | ORAL | Status: AC
  Administered 2020-12-10: 25 mg via ORAL

## 2020-12-10 MED ORDER — ACETAMINOPHEN 325 MG PO TABS
650.0000 mg | ORAL_TABLET | Freq: Once | ORAL | Status: AC
  Administered 2020-12-10: 650 mg via ORAL

## 2020-12-10 MED ORDER — POTASSIUM CHLORIDE CRYS ER 20 MEQ PO TBCR
40.0000 meq | EXTENDED_RELEASE_TABLET | Freq: Once | ORAL | Status: AC
  Administered 2020-12-10: 40 meq via ORAL

## 2020-12-10 NOTE — Telephone Encounter (Signed)
Chart reviewed. Revlimid refilled per last office note with Dr. Katragadda.  

## 2020-12-10 NOTE — Patient Instructions (Signed)
[place]  Discharge Instructions: Thank you for choosing [facility] to provide your oncology and hematology care.  If you have a lab appointment with the [facility], please come in thru the Main Entrance and check in at the main information desk.  Wear comfortable clothing and clothing appropriate for easy access to any Portacath or PICC line.   We strive to give you quality time with your provider. You may need to reschedule your appointment if you arrive late (15 or more minutes).  Arriving late affects you and other patients whose appointments are after yours.  Also, if you miss three or more appointments without notifying the office, you may be dismissed from the clinic at the provider's discretion.      For prescription refill requests, have your pharmacy contact our office and allow 72 hours for refills to be completed.    Today you received the following chemotherapy and/or immunotherapy agents Daratumumab and Velcade. Return as scheduled.   To help prevent nausea and vomiting after your treatment, we encourage you to take your nausea medication as directed.  BELOW ARE SYMPTOMS THAT SHOULD BE REPORTED IMMEDIATELY: *FEVER GREATER THAN 100.4 F (38 C) OR HIGHER *CHILLS OR SWEATING *NAUSEA AND VOMITING THAT IS NOT CONTROLLED WITH YOUR NAUSEA MEDICATION *UNUSUAL SHORTNESS OF BREATH *UNUSUAL BRUISING OR BLEEDING *URINARY PROBLEMS (pain or burning when urinating, or frequent urination) *BOWEL PROBLEMS (unusual diarrhea, constipation, pain near the anus) TENDERNESS IN MOUTH AND THROAT WITH OR WITHOUT PRESENCE OF ULCERS (sore throat, sores in mouth, or a toothache) UNUSUAL RASH, SWELLING OR PAIN  UNUSUAL VAGINAL DISCHARGE OR ITCHING   Items with * indicate a potential emergency and should be followed up as soon as possible or go to the Emergency Department if any problems should occur.  Please show the CHEMOTHERAPY ALERT CARD or IMMUNOTHERAPY ALERT CARD at  check-in to the Emergency Department and triage nurse.  Should you have questions after your visit or need to cancel or reschedule your appointment, please contact [facility] [phone]  and follow the prompts.  Office hours are 8:00 a.m. to 4:30 p.m. Monday - Friday. Please note that voicemails left after 4:00 p.m. may not be returned until the following business day.  We are closed weekends and major holidays. You have access to a nurse at all times for urgent questions. Please call the main number to the clinic [phone] and follow the prompts.  For any non-urgent questions, you may also contact your provider using MyChart. We now offer e-Visits for anyone 63 and older to request care online for non-urgent symptoms. For details visit [email or web address].   Also download the MyChart app! Go to the app store, search "MyChart", open the app, select [facility], and log in with your MyChart username and password.  Due to Covid, a mask is required upon entering the hospital/clinic. If you do not have a mask, one will be given to you upon arrival. For doctor visits, patients may have 1 support person aged 63 or older with them. For treatment visits, patients cannot have anyone with them due to current Covid guidelines and our immunocompromised population.

## 2020-12-10 NOTE — Progress Notes (Signed)
Patient's potassium 3.3, 62mEq of PO potassium given to patient via Dr. Delton Coombes standing orders.  Patient tolerated Daratumumab injection with no complaints voiced. See MAR for details. Lab reviewed. Injection site clean and dry with no bruising or swelling noted at site. Band aid applied.  Patient tolerated Velcade injection with no complaints voiced. Lab work reviewed. See MAR for details. Injection site clean and dry with no bruising or swelling noted. Patient stable during and after injection. Band aid applied. VSS. Patient declined AVS. Patient left in satisfactory condition with no s/s of distress noted.

## 2020-12-17 ENCOUNTER — Ambulatory Visit (HOSPITAL_COMMUNITY): Payer: 59 | Admitting: Hematology

## 2020-12-17 ENCOUNTER — Inpatient Hospital Stay (HOSPITAL_COMMUNITY): Payer: 59 | Attending: Hematology

## 2020-12-17 ENCOUNTER — Other Ambulatory Visit: Payer: Self-pay

## 2020-12-17 ENCOUNTER — Inpatient Hospital Stay (HOSPITAL_COMMUNITY): Payer: 59

## 2020-12-17 VITALS — BP 133/78 | HR 63 | Temp 96.9°F | Resp 17 | Ht 69.0 in | Wt 231.6 lb

## 2020-12-17 DIAGNOSIS — C9000 Multiple myeloma not having achieved remission: Secondary | ICD-10-CM

## 2020-12-17 DIAGNOSIS — Z5112 Encounter for antineoplastic immunotherapy: Secondary | ICD-10-CM | POA: Diagnosis not present

## 2020-12-17 LAB — CBC WITH DIFFERENTIAL/PLATELET
Abs Immature Granulocytes: 0.01 10*3/uL (ref 0.00–0.07)
Basophils Absolute: 0 10*3/uL (ref 0.0–0.1)
Basophils Relative: 1 %
Eosinophils Absolute: 0.1 10*3/uL (ref 0.0–0.5)
Eosinophils Relative: 1 %
HCT: 38.8 % — ABNORMAL LOW (ref 39.0–52.0)
Hemoglobin: 13 g/dL (ref 13.0–17.0)
Immature Granulocytes: 0 %
Lymphocytes Relative: 49 %
Lymphs Abs: 2.5 10*3/uL (ref 0.7–4.0)
MCH: 33.4 pg (ref 26.0–34.0)
MCHC: 33.5 g/dL (ref 30.0–36.0)
MCV: 99.7 fL (ref 80.0–100.0)
Monocytes Absolute: 0.7 10*3/uL (ref 0.1–1.0)
Monocytes Relative: 14 %
Neutro Abs: 1.7 10*3/uL (ref 1.7–7.7)
Neutrophils Relative %: 35 %
Platelets: 201 10*3/uL (ref 150–400)
RBC: 3.89 MIL/uL — ABNORMAL LOW (ref 4.22–5.81)
RDW: 14.6 % (ref 11.5–15.5)
WBC: 5 10*3/uL (ref 4.0–10.5)
nRBC: 0 % (ref 0.0–0.2)

## 2020-12-17 LAB — COMPREHENSIVE METABOLIC PANEL
ALT: 17 U/L (ref 0–44)
AST: 20 U/L (ref 15–41)
Albumin: 3.9 g/dL (ref 3.5–5.0)
Alkaline Phosphatase: 56 U/L (ref 38–126)
Anion gap: 7 (ref 5–15)
BUN: 15 mg/dL (ref 8–23)
CO2: 28 mmol/L (ref 22–32)
Calcium: 9.1 mg/dL (ref 8.9–10.3)
Chloride: 103 mmol/L (ref 98–111)
Creatinine, Ser: 0.92 mg/dL (ref 0.61–1.24)
GFR, Estimated: >60 mL/min (ref >60)
Glucose, Bld: 123 mg/dL — ABNORMAL HIGH (ref 70–99)
Potassium: 3.8 mmol/L (ref 3.5–5.1)
Sodium: 138 mmol/L (ref 135–145)
Total Bilirubin: 0.6 mg/dL (ref 0.3–1.2)
Total Protein: 6.7 g/dL (ref 6.5–8.1)

## 2020-12-17 LAB — LACTATE DEHYDROGENASE: LDH: 123 U/L (ref 98–192)

## 2020-12-17 LAB — MAGNESIUM: Magnesium: 1.8 mg/dL (ref 1.7–2.4)

## 2020-12-17 MED ORDER — ACETAMINOPHEN 325 MG PO TABS
650.0000 mg | ORAL_TABLET | Freq: Once | ORAL | Status: AC
  Administered 2020-12-17: 650 mg via ORAL
  Filled 2020-12-17: qty 2

## 2020-12-17 MED ORDER — DARATUMUMAB-HYALURONIDASE-FIHJ 1800-30000 MG-UT/15ML ~~LOC~~ SOLN
1800.0000 mg | Freq: Once | SUBCUTANEOUS | Status: AC
  Administered 2020-12-17: 1800 mg via SUBCUTANEOUS
  Filled 2020-12-17: qty 15

## 2020-12-17 MED ORDER — DIPHENHYDRAMINE HCL 25 MG PO CAPS
25.0000 mg | ORAL_CAPSULE | Freq: Once | ORAL | Status: AC
  Administered 2020-12-17: 25 mg via ORAL
  Filled 2020-12-17: qty 1

## 2020-12-17 MED ORDER — DEXAMETHASONE 4 MG PO TABS
40.0000 mg | ORAL_TABLET | Freq: Once | ORAL | Status: AC
  Administered 2020-12-17: 40 mg via ORAL
  Filled 2020-12-17: qty 10

## 2020-12-17 MED ORDER — BORTEZOMIB CHEMO SQ INJECTION 3.5 MG (2.5MG/ML)
1.3000 mg/m2 | Freq: Once | INTRAMUSCULAR | Status: AC
  Administered 2020-12-17: 2.75 mg via SUBCUTANEOUS
  Filled 2020-12-17: qty 1.1

## 2020-12-17 NOTE — Patient Instructions (Signed)
[facility]  Discharge Instructions: Thank you for choosing [facility] to provide your oncology and hematology care.  If you have a lab appointment with the [facility], please come in thru the Main Entrance and check in at the main information desk.  Wear comfortable clothing and clothing appropriate for easy access to any Portacath or PICC line.   We strive to give you quality time with your provider. You may need to reschedule your appointment if you arrive late (15 or more minutes).  Arriving late affects you and other patients whose appointments are after yours.  Also, if you miss three or more appointments without notifying the office, you may be dismissed from the clinic at the provider's discretion.      For prescription refill requests, have your pharmacy contact our office and allow 72 hours for refills to be completed.    Today you received the following chemotherapy and/or immunotherapy agents Velcade injection and Dara SQ   To help prevent nausea and vomiting after your treatment, we encourage you to take your nausea medication as directed.  BELOW ARE SYMPTOMS THAT SHOULD BE REPORTED IMMEDIATELY: *FEVER GREATER THAN 100.4 F (38 C) OR HIGHER *CHILLS OR SWEATING *NAUSEA AND VOMITING THAT IS NOT CONTROLLED WITH YOUR NAUSEA MEDICATION *UNUSUAL SHORTNESS OF BREATH *UNUSUAL BRUISING OR BLEEDING *URINARY PROBLEMS (pain or burning when urinating, or frequent urination) *BOWEL PROBLEMS (unusual diarrhea, constipation, pain near the anus) TENDERNESS IN MOUTH AND THROAT WITH OR WITHOUT PRESENCE OF ULCERS (sore throat, sores in mouth, or a toothache) UNUSUAL RASH, SWELLING OR PAIN  UNUSUAL VAGINAL DISCHARGE OR ITCHING   Items with * indicate a potential emergency and should be followed up as soon as possible or go to the Emergency Department if any problems should occur.  Please show the CHEMOTHERAPY ALERT CARD or IMMUNOTHERAPY ALERT CARD at check-in to the  Emergency Department and triage nurse.  Should you have questions after your visit or need to cancel or reschedule your appointment, please contact [facility] [phone]  and follow the prompts.  Office hours are 8:00 a.m. to 4:30 p.m. Monday - Friday. Please note that voicemails left after 4:00 p.m. may not be returned until the following business day.  We are closed weekends and major holidays. You have access to a nurse at all times for urgent questions. Please call the main number to the clinic [phone] and follow the prompts.  For any non-urgent questions, you may also contact your provider using MyChart. We now offer e-Visits for anyone 60 and older to request care online for non-urgent symptoms. For details visit [email or web address].   Also download the MyChart app! Go to the app store, search "MyChart", open the app, select [facility] [place], and log in with your MyChart username and password.  Due to Covid, a mask is required upon entering the hospital/clinic. If you do not have a mask, one will be given to you upon arrival. For doctor visits, patients may have 1 support person aged 9 or older with them. For treatment visits, patients cannot have anyone with them due to current Covid guidelines and our immunocompromised population.

## 2020-12-17 NOTE — Progress Notes (Signed)
Pt presents today for Velcade injection and Dara SQ per provider's order. Vital signs and labs WNL for treatment today. Pt voiced no new complaints at this time. Okay for treatment per Dr.K.  Velcade injection and Dara SQ given today per MD orders. Tolerated infusion without adverse affects. Vital signs stable. No complaints at this time. Discharged from clinic ambulatory in stable condition. Alert and oriented x 3. F/U with [facility] as scheduled. Pt declined AVS at this time.

## 2020-12-18 LAB — KAPPA/LAMBDA LIGHT CHAINS
Kappa free light chain: 17.3 mg/L (ref 3.3–19.4)
Kappa, lambda light chain ratio: 2.08 — ABNORMAL HIGH (ref 0.26–1.65)
Lambda free light chains: 8.3 mg/L (ref 5.7–26.3)

## 2020-12-19 ENCOUNTER — Other Ambulatory Visit (HOSPITAL_COMMUNITY): Payer: Self-pay

## 2020-12-19 LAB — PROTEIN ELECTROPHORESIS, SERUM
A/G Ratio: 1.3 (ref 0.7–1.7)
Albumin ELP: 3.6 g/dL (ref 2.9–4.4)
Alpha-1-Globulin: 0.2 g/dL (ref 0.0–0.4)
Alpha-2-Globulin: 0.7 g/dL (ref 0.4–1.0)
Beta Globulin: 1 g/dL (ref 0.7–1.3)
Gamma Globulin: 0.8 g/dL (ref 0.4–1.8)
Globulin, Total: 2.8 g/dL (ref 2.2–3.9)
M-Spike, %: 0.2 g/dL — ABNORMAL HIGH
Total Protein ELP: 6.4 g/dL (ref 6.0–8.5)

## 2020-12-23 NOTE — Progress Notes (Signed)
Eugene [name], Eugene [name]   CLINIC:  Medical Oncology/Hematology  PCP:  [name], MD [address] / [place] [address] [phone]   REASON FOR VISIT:  Follow-up for multiple myeloma  PRIOR THERAPY: none  NGS Results: not done  CURRENT THERAPY: DaraVRd every 3 weeks x 6 cycles  BRIEF ONCOLOGIC HISTORY:  Oncology History  Multiple myeloma without remission ([place])  07/09/2020 Pathology Results   BONE MARROW, ASPIRATE, CLOT, CORE:  -Hypercellular bone marrow with plasma cell neoplasm  -See comment   PERIPHERAL BLOOD:  -Slight macrocytic anemia   COMMENT:   The bone marrow is hypercellular for age with trilineage hematopoiesis and nonspecific myeloid changes.  In this background, the plasma cells are increased in number representing 10% of all cells associated with atypical cytomorphologic features.  The plasma cells display kappa light chain restriction consistent with plasma cell neoplasm.  Correlation with cytogenetic and FISH studies is recommended.    07/24/2020 Initial Diagnosis   Multiple myeloma without remission ([place])   07/24/2020 Cancer Staging   Staging form: Plasma Cell Myeloma and Plasma Cell Disorders, AJCC 8th Edition - Clinical stage from 07/24/2020: RISS Stage I (Beta-2-microglobulin (mg/L): 2.1, Albumin (g/dL): 3.7, ISS: Stage I, High-risk cytogenetics: Absent, LDH: Normal) - Signed by Heath Lark, MD on 07/24/2020 Stage prefix: Initial diagnosis Beta 2 microglobulin range (mg/L): Less than 3.5 Albumin range (g/dL): Greater than or equal to 3.5 Cytogenetics: No abnormalities    09/03/2020 -  Chemotherapy   Patient is on Treatment Plan : MYELOMA NEWLY DIAGNOSED TRANSPLANT CANDIDATE DaraVRd (Daratumumab SQ) q21d x 6 Cycles (Induction/Consolidation)       CANCER STAGING:  Cancer Staging  Multiple myeloma without remission ([place]) Staging form: Plasma Cell Myeloma and Plasma Cell Disorders, AJCC 8th  Edition - Clinical stage from 07/24/2020: RISS Stage I (Beta-2-microglobulin (mg/L): 2.1, Albumin (g/dL): 3.7, ISS: Stage I, High-risk cytogenetics: Absent, LDH: Normal) - Signed by Heath Lark, MD on 07/24/2020   INTERVAL HISTORY:  [name], a 65 y.o. male, returns for routine follow-up and consideration for next cycle of chemotherapy. Dierre was last seen on 11/26/2020.  Due for day #8 cycle #6 of DaraVRd today.   Overall, he tells me he has been feeling pretty well. He reports swelling in his feet and ankles. He does not currently have pain in his right mid-thigh.   Overall, he feels ready for next cycle of chemo today.   REVIEW OF SYSTEMS:  Review of Systems  Constitutional:  Negative for appetite change and fatigue (90%).  Cardiovascular:  Positive for leg swelling (feet and ankles).  All other systems reviewed and are negative.  PAST MEDICAL/SURGICAL HISTORY:  Past Medical History:  Diagnosis Date   Back pain    CAD (coronary artery disease)    a. 01/2017 NSTEMI/Cath: LM nl, LAD 25p - ? hypodense but no filling defect, RI nl, LCX nl, RCA nl, EF 55-65%-->Med Rx.   Erectile dysfunction    History of echocardiogram    a. 01/2017 Echo: EF 55-60%, no rwma.   Hypertension    Past Surgical History:  Procedure Laterality Date   LEFT HEART CATH AND CORONARY ANGIOGRAPHY N/A 01/15/2017   Procedure: LEFT HEART CATH AND CORONARY ANGIOGRAPHY;  Surgeon: Martinique, Peter M, MD;  Location: [facility];  Service: Cardiovascular;  Laterality: N/A;   ORIF trocanteric femoral IM Nail Right     SOCIAL HISTORY:  Social History   Socioeconomic History  Marital status: Significant Other    Spouse name: Not on file   Number of children: Not on file   Years of education: Not on file   Highest education level: Not on file  Occupational History   Not on file  Tobacco Use   Smoking status: Never   Smokeless tobacco: Never  Vaping Use   Vaping Use: Never used  Substance and  Sexual Activity   Alcohol use: No   Drug use: No   Sexual activity: Yes  Other Topics Concern   Not on file  Social History Narrative   Not on file   Social Determinants of Health   Financial Resource Strain: Low Risk    Difficulty of Paying Living Expenses: Not hard at all  Food Insecurity: No Food Insecurity   Worried About Charity fundraiser in the Last Year: Never true   [place] in the Last Year: Never true  Transportation Needs: No Transportation Needs   Lack of Transportation (Medical): No   Lack of Transportation (Non-Medical): No  Physical Activity: Insufficiently Active   Days of Exercise per Week: 2 days   Minutes of Exercise per Session: 10 min  Stress: No Stress Concern Present   Feeling of Stress : Not at all  Social Connections: Moderately Integrated   Frequency of Communication with Friends and Family: More than three times a week   Frequency of Social Gatherings with Friends and Family: More than three times a week   Attends Religious Services: 1 to 4 times per year   Active Member of [employer] or Organizations: No   Attends Music therapist: 1 to 4 times per year   Marital Status: Divorced  Human resources officer Violence: Not At Risk   Fear of Current or Ex-Partner: No   Emotionally Abused: No   Physically Abused: No   Sexually Abused: No    FAMILY HISTORY:  Family History  Problem Relation Age of Onset   Cancer Mother        breast   Diabetes Mother    Diabetes Sister    Hypertension Sister    Cancer Brother    Mental illness Sister     CURRENT MEDICATIONS:  Current Outpatient Medications  Medication Sig Dispense Refill   acyclovir (ZOVIRAX) 400 MG tablet Take 1 tablet (400 mg total) by mouth 2 (two) times daily. 60 tablet 5   aspirin EC 81 MG tablet Take 1 tablet (81 mg total) by mouth daily with breakfast. 30 tablet 5   BORTEZOMIB IJ Inject as directed once a week.     calcium-vitamin D (OSCAL WITH D) 500-200 MG-UNIT tablet Take  1 tablet by mouth.     DARATUMUMAB-HYALURONIDASE-FIHJ [place] Inject into the skin once a week.     dexamethasone (DECADRON) 4 MG tablet Take 10 tablets (40 mg total) by mouth once a week. 40 tablet 6   metoprolol succinate (TOPROL-XL) 25 MG 24 hr tablet TAKE 1 TABLET BY MOUTH DAILY. (Patient taking differently: Take 25 mg by mouth daily.) 90 tablet 3   Multiple Vitamin (MULTIVITAMIN) tablet Take 1 tablet by mouth daily.       nitroGLYCERIN (NITROSTAT) 0.4 MG SL tablet Place 1 tablet (0.4 mg total) under the tongue every 5 (five) minutes for 3 doses as needed for chest pain. 25 tablet 3   REVLIMID 25 MG capsule TAKE 1 CAPSULE BY MOUTH ONCE DAILY FOR 14 DAYS ON AND 7 DAYS OFF 14 capsule 0   rosuvastatin (  CRESTOR) 40 MG tablet Take 1 tablet (40 mg total) by mouth every other day. 90 tablet 1   traMADol (ULTRAM) 50 MG tablet Take 1 tablet (50 mg total) by mouth every 6 (six) hours as needed. 60 tablet 0   No current facility-administered medications for this visit.    ALLERGIES:  No Known Allergies  PHYSICAL EXAM:  Performance status (ECOG): 1 - Symptomatic but completely ambulatory  There were no vitals filed for this visit. Wt Readings from Last 3 Encounters:  12/17/20 231 lb 9.6 oz (105.1 kg)  12/10/20 230 lb 12.8 oz (104.7 kg)  12/03/20 226 lb (102.5 kg)   Physical Exam Vitals reviewed.  Constitutional:      Appearance: Normal appearance.  Cardiovascular:     Rate and Rhythm: Normal rate and regular rhythm.     Pulses: Normal pulses.     Heart sounds: Normal heart sounds.  Pulmonary:     Effort: Pulmonary effort is normal.     Breath sounds: Normal breath sounds.  Neurological:     General: No focal deficit present.     Mental Status: He is alert and oriented to person, place, and time.  Psychiatric:        Mood and Affect: Mood normal.        Behavior: Behavior normal.    LABORATORY DATA:  I have reviewed the labs as listed.  CBC Latest Ref Rng & Units 12/17/2020 12/10/2020  12/03/2020  WBC 4.0 - 10.5 K/uL 5.0 6.1 5.0  Hemoglobin 13.0 - 17.0 g/dL 13.0 12.3(L) 12.8(L)  Hematocrit 39.0 - 52.0 % 38.8(L) 36.7(L) 38.9(L)  Platelets 150 - 400 K/uL 201 215 191   CMP Latest Ref Rng & Units 12/17/2020 12/10/2020 12/03/2020  Glucose 70 - 99 mg/dL 123(H) 107(H) 126(H)  BUN 8 - 23 mg/dL _0 Creatinine 0.61 - 1.24 mg/dL 0.92 1.03 0.97  Sodium 135 - 145 mmol/L 138 136 139  Potassium 3.5 - 5.1 mmol/L 3.8 3.3(L) 3.3(L)  Chloride 98 - 111 mmol/L 103 102 102  CO2 22 - 32 mmol/L _1 Calcium 8.9 - 10.3 mg/dL 9.1 8.3(L) 8.8(L)  Total Protein 6.5 - 8.1 g/dL 6.7 6.5 6.9  Total Bilirubin 0.3 - 1.2 mg/dL 0.6 0.5 0.9  Alkaline Phos 38 - 126 U/L 56 57 62  AST 15 - 41 U/L _2 ALT 0 - 44 U/L _3 DIAGNOSTIC IMAGING:  I have independently reviewed the scans and discussed with the patient. No results found.   ASSESSMENT:  1.  Standard risk plasma cell myeloma: - He reported low back pain radiating to the right thigh since April, pain gets worse when he walks for more than 2 blocks. - He had history of MGUS and was last seen in our clinic in 2019.  He had an abnormal M spike of 3.2 g on 12/06/2018. - Skeletal survey on 10/22/2017 showed no suspicious lytic lesions. - X-ray of the right sided pelvis on 06/11/2020 showed prominent lytic lesions noted in the proximal midportion of the right femoral diaphysis with no evidence of fracture or dislocation. - Bone marrow biopsy on 07/09/2020 with hypercellular marrow with trilineage hematopoiesis.  Plasma cells representing 10% of cells. - Chromosome analysis 46, XY (20).  Multiple myeloma FISH panel negative. - PET scan on 07/04/2020 with multiple hypermetabolic bony soft tissue lesions.  Dominant lesions in the L5 vertebral body and posterior right acetabulum and distal right femur.  Focal  increased uptake in the right tonsillar region with associated soft tissue fullness on CT imaging. - 24-hour urine with total  protein 134. - Beta-2 microglobulin 2.1 (06/26/2020), LDH 136. - Dara RVD cycle 1 started on 09/03/2020.  2.  Social/family history: - He works at [employer] in environmental services. - He was never smoker. - 2 brothers died of metastatic lung cancer.  Sister has thyroid cancer.  Mother had breast cancer.   PLAN:  1.  Stage I standard risk IgG kappa multiple myeloma: - He has completed 5 cycles of Dara RVD. - [facility] Quentin Ore at [facility] recommended stem cell collection when his M spike is close to 0. - We reviewed myeloma panel from 12/17/2020.  M spike is 0.2 g and downtrending.  Free light chain ratio is 2.08.  Kappa light chains are 17.3. - Continue with cycle 6-day 8 today. - After this cycle, he will continue Revlimid 25 mg 2 weeks on/1 week of along with dexamethasone 40 mg weekly.  RTC on 01/15/2021 with repeat myeloma labs.  If his M spike is undetectable at that time, we will arrange for stem cell collection.  He is tolerating treatment well except slight ankle swellings.  2.  Right mid thigh pain: - He had a right femur ORIF in July 2022.  No pain reported.  Radiation was not done as he was started on treatment.  3.  Infection prophylaxis: - Continue acyclovir 400 mg twice daily.  Continue aspirin 81 mg daily.  4.  Myeloma bone disease: - Calcium is normal.  He will receive Zometa today and in 4 weeks.   Orders placed this encounter:  No orders of the defined types were placed in this encounter.    Derek Jack, MD [place] [phone]   I, Thana Ates, am acting as a scribe for Dr. Derek Jack.  I, Derek Jack MD, have reviewed the above documentation for accuracy and completeness, and I agree with the above.

## 2020-12-24 ENCOUNTER — Inpatient Hospital Stay (HOSPITAL_COMMUNITY): Payer: 59

## 2020-12-24 ENCOUNTER — Inpatient Hospital Stay (HOSPITAL_COMMUNITY): Payer: 59 | Admitting: Hematology

## 2020-12-24 ENCOUNTER — Other Ambulatory Visit: Payer: Self-pay

## 2020-12-24 ENCOUNTER — Encounter (HOSPITAL_COMMUNITY): Payer: Self-pay | Admitting: Hematology

## 2020-12-24 VITALS — BP 130/81 | HR 78 | Temp 98.1°F | Resp 18 | Ht 68.9 in | Wt 227.7 lb

## 2020-12-24 VITALS — BP 124/69 | HR 66 | Temp 96.6°F | Resp 18 | Ht 68.9 in

## 2020-12-24 DIAGNOSIS — Z5112 Encounter for antineoplastic immunotherapy: Secondary | ICD-10-CM | POA: Diagnosis not present

## 2020-12-24 DIAGNOSIS — E876 Hypokalemia: Secondary | ICD-10-CM

## 2020-12-24 DIAGNOSIS — M899 Disorder of bone, unspecified: Secondary | ICD-10-CM | POA: Diagnosis not present

## 2020-12-24 DIAGNOSIS — C9000 Multiple myeloma not having achieved remission: Secondary | ICD-10-CM | POA: Diagnosis not present

## 2020-12-24 LAB — COMPREHENSIVE METABOLIC PANEL
ALT: 18 U/L (ref 0–44)
AST: 24 U/L (ref 15–41)
Albumin: 4.1 g/dL (ref 3.5–5.0)
Alkaline Phosphatase: 60 U/L (ref 38–126)
Anion gap: 11 (ref 5–15)
BUN: 10 mg/dL (ref 8–23)
CO2: 24 mmol/L (ref 22–32)
Calcium: 8.9 mg/dL (ref 8.9–10.3)
Chloride: 102 mmol/L (ref 98–111)
Creatinine, Ser: 0.97 mg/dL (ref 0.61–1.24)
GFR, Estimated: >60 mL/min (ref >60)
Glucose, Bld: 117 mg/dL — ABNORMAL HIGH (ref 70–99)
Potassium: 3.3 mmol/L — ABNORMAL LOW (ref 3.5–5.1)
Sodium: 137 mmol/L (ref 135–145)
Total Bilirubin: 0.8 mg/dL (ref 0.3–1.2)
Total Protein: 6.9 g/dL (ref 6.5–8.1)

## 2020-12-24 LAB — CBC WITH DIFFERENTIAL/PLATELET
Abs Immature Granulocytes: 0.02 10*3/uL (ref 0.00–0.07)
Basophils Absolute: 0 10*3/uL (ref 0.0–0.1)
Basophils Relative: 1 %
Eosinophils Absolute: 0.3 10*3/uL (ref 0.0–0.5)
Eosinophils Relative: 6 %
HCT: 39.7 % (ref 39.0–52.0)
Hemoglobin: 13.3 g/dL (ref 13.0–17.0)
Immature Granulocytes: 0 %
Lymphocytes Relative: 49 %
Lymphs Abs: 2.6 10*3/uL (ref 0.7–4.0)
MCH: 32.8 pg (ref 26.0–34.0)
MCHC: 33.5 g/dL (ref 30.0–36.0)
MCV: 98 fL (ref 80.0–100.0)
Monocytes Absolute: 0.5 10*3/uL (ref 0.1–1.0)
Monocytes Relative: 10 %
Neutro Abs: 1.8 10*3/uL (ref 1.7–7.7)
Neutrophils Relative %: 34 %
Platelets: 198 10*3/uL (ref 150–400)
RBC: 4.05 MIL/uL — ABNORMAL LOW (ref 4.22–5.81)
RDW: 14.5 % (ref 11.5–15.5)
WBC: 5.3 10*3/uL (ref 4.0–10.5)
nRBC: 0 % (ref 0.0–0.2)

## 2020-12-24 LAB — MAGNESIUM: Magnesium: 2 mg/dL (ref 1.7–2.4)

## 2020-12-24 MED ORDER — DEXAMETHASONE 4 MG PO TABS
40.0000 mg | ORAL_TABLET | Freq: Once | ORAL | Status: AC
  Administered 2020-12-24: 40 mg via ORAL
  Filled 2020-12-24: qty 10

## 2020-12-24 MED ORDER — PROCHLORPERAZINE MALEATE 10 MG PO TABS
10.0000 mg | ORAL_TABLET | Freq: Once | ORAL | Status: AC
  Administered 2020-12-24: 10 mg via ORAL
  Filled 2020-12-24: qty 1

## 2020-12-24 MED ORDER — DEXAMETHASONE 4 MG PO TABS: 40.0000 mg | ORAL_TABLET | ORAL | 6 refills | Status: DC

## 2020-12-24 MED ORDER — SODIUM CHLORIDE 0.9 % IV SOLN: Freq: Once | INTRAVENOUS | Status: AC

## 2020-12-24 MED ORDER — ZOLEDRONIC ACID 4 MG/100ML IV SOLN
4.0000 mg | Freq: Once | INTRAVENOUS | Status: AC
  Administered 2020-12-24: 4 mg via INTRAVENOUS
  Filled 2020-12-24: qty 100

## 2020-12-24 MED ORDER — BORTEZOMIB CHEMO SQ INJECTION 3.5 MG (2.5MG/ML)
1.3000 mg/m2 | Freq: Once | INTRAMUSCULAR | Status: AC
  Administered 2020-12-24: 2.75 mg via SUBCUTANEOUS
  Filled 2020-12-24: qty 1.1

## 2020-12-24 NOTE — Progress Notes (Signed)
Patient is taking Revlimid as prescribed.  He has not missed any doses and reports no side effects at this time.    Patient has been examined, vital signs and labs have been reviewed by Dr. Delton Coombes. ANC, Creatinine, LFTs, hemoglobin, and platelets are within treatment parameters per Dr. Delton Coombes. Patient may proceed with treatment per M.D.

## 2020-12-24 NOTE — Patient Instructions (Signed)
[facility]  Discharge Instructions: Thank you for choosing [facility] to provide your oncology and hematology care.  If you have a lab appointment with the [facility], please come in thru the Main Entrance and check in at the main information desk.  Wear comfortable clothing and clothing appropriate for easy access to any Portacath or PICC line.   We strive to give you quality time with your provider. You may need to reschedule your appointment if you arrive late (15 or more minutes).  Arriving late affects you and other patients whose appointments are after yours.  Also, if you miss three or more appointments without notifying the office, you may be dismissed from the clinic at the providers discretion.      For prescription refill requests, have your pharmacy contact our office and allow 72 hours for refills to be completed.    Today you received the following chemotherapy and/or immunotherapy agents Velcade and Zometa      To help prevent nausea and vomiting after your treatment, we encourage you to take your nausea medication as directed.  BELOW ARE SYMPTOMS THAT SHOULD BE REPORTED IMMEDIATELY: *FEVER GREATER THAN 100.4 F (38 C) OR HIGHER *CHILLS OR SWEATING *NAUSEA AND VOMITING THAT IS NOT CONTROLLED WITH YOUR NAUSEA MEDICATION *UNUSUAL SHORTNESS OF BREATH *UNUSUAL BRUISING OR BLEEDING *URINARY PROBLEMS (pain or burning when urinating, or frequent urination) *BOWEL PROBLEMS (unusual diarrhea, constipation, pain near the anus) TENDERNESS IN MOUTH AND THROAT WITH OR WITHOUT PRESENCE OF ULCERS (sore throat, sores in mouth, or a toothache) UNUSUAL RASH, SWELLING OR PAIN  UNUSUAL VAGINAL DISCHARGE OR ITCHING   Items with * indicate a potential emergency and should be followed up as soon as possible or go to the Emergency Department if any problems should occur.  Please show the CHEMOTHERAPY ALERT CARD or IMMUNOTHERAPY ALERT CARD at check-in to the  Emergency Department and triage nurse.  Should you have questions after your visit or need to cancel or reschedule your appointment, please contact [facility] [phone]  and follow the prompts.  Office hours are 8:00 a.m. to 4:30 p.m. Monday - Friday. Please note that voicemails left after 4:00 p.m. may not be returned until the following business day.  We are closed weekends and major holidays. You have access to a nurse at all times for urgent questions. Please call the main number to the clinic [phone] and follow the prompts.  For any non-urgent questions, you may also contact your provider using MyChart. We now offer e-Visits for anyone 71 and older to request care online for non-urgent symptoms. For details visit [email or web address].   Also download the MyChart app! Go to the app store, search "MyChart", open the app, select [place], and log in with your MyChart username and password.  Due to Covid, a mask is required upon entering the hospital/clinic. If you do not have a mask, one will be given to you upon arrival. For doctor visits, patients may have 1 support person aged 41 or older with them. For treatment visits, patients cannot have anyone with them due to current Covid guidelines and our immunocompromised population.

## 2020-12-24 NOTE — Progress Notes (Signed)
Patient presents today for Velcade and Zometa per providers order.  Vital signs and labs within parameters for treatment.    Peripheral IV started and blood return noted pre and post infusion.  Velcade and Zometa given today per MD orders.  Stable during infusion without adverse affects.  Vital signs stable.  No complaints at this time.  Discharge from clinic ambulatory in stable condition.  Alert and oriented X 3.  Follow up with [facility] as scheduled.

## 2020-12-25 ENCOUNTER — Other Ambulatory Visit (HOSPITAL_COMMUNITY): Payer: Self-pay

## 2020-12-25 MED FILL — Metoprolol Succinate Tab ER 24HR 25 MG (Tartrate Equiv): ORAL | 90 days supply | Qty: 90 | Fill #2 | Status: AC

## 2020-12-31 ENCOUNTER — Other Ambulatory Visit: Payer: Self-pay

## 2020-12-31 ENCOUNTER — Other Ambulatory Visit (HOSPITAL_COMMUNITY): Payer: Self-pay | Admitting: Hematology

## 2020-12-31 ENCOUNTER — Inpatient Hospital Stay (HOSPITAL_COMMUNITY): Payer: 59

## 2020-12-31 VITALS — BP 118/77 | HR 91 | Temp 96.6°F | Resp 18 | Ht 68.9 in | Wt 227.8 lb

## 2020-12-31 DIAGNOSIS — C9000 Multiple myeloma not having achieved remission: Secondary | ICD-10-CM

## 2020-12-31 DIAGNOSIS — Z5112 Encounter for antineoplastic immunotherapy: Secondary | ICD-10-CM | POA: Diagnosis not present

## 2020-12-31 LAB — CBC WITH DIFFERENTIAL/PLATELET
Abs Immature Granulocytes: 0.03 10*3/uL (ref 0.00–0.07)
Basophils Absolute: 0 10*3/uL (ref 0.0–0.1)
Basophils Relative: 0 %
Eosinophils Absolute: 0.2 10*3/uL (ref 0.0–0.5)
Eosinophils Relative: 3 %
HCT: 38.7 % — ABNORMAL LOW (ref 39.0–52.0)
Hemoglobin: 13.1 g/dL (ref 13.0–17.0)
Immature Granulocytes: 1 %
Lymphocytes Relative: 41 %
Lymphs Abs: 2.6 10*3/uL (ref 0.7–4.0)
MCH: 33 pg (ref 26.0–34.0)
MCHC: 33.9 g/dL (ref 30.0–36.0)
MCV: 97.5 fL (ref 80.0–100.0)
Monocytes Absolute: 0.8 10*3/uL (ref 0.1–1.0)
Monocytes Relative: 13 %
Neutro Abs: 2.6 10*3/uL (ref 1.7–7.7)
Neutrophils Relative %: 42 %
Platelets: 244 10*3/uL (ref 150–400)
RBC: 3.97 MIL/uL — ABNORMAL LOW (ref 4.22–5.81)
RDW: 14 % (ref 11.5–15.5)
WBC: 6.2 10*3/uL (ref 4.0–10.5)
nRBC: 0 % (ref 0.0–0.2)

## 2020-12-31 LAB — COMPREHENSIVE METABOLIC PANEL
ALT: 20 U/L (ref 0–44)
AST: 23 U/L (ref 15–41)
Albumin: 4 g/dL (ref 3.5–5.0)
Alkaline Phosphatase: 63 U/L (ref 38–126)
Anion gap: 8 (ref 5–15)
BUN: 12 mg/dL (ref 8–23)
CO2: 26 mmol/L (ref 22–32)
Calcium: 8.5 mg/dL — ABNORMAL LOW (ref 8.9–10.3)
Chloride: 102 mmol/L (ref 98–111)
Creatinine, Ser: 0.95 mg/dL (ref 0.61–1.24)
GFR, Estimated: >60 mL/min (ref >60)
Glucose, Bld: 142 mg/dL — ABNORMAL HIGH (ref 70–99)
Potassium: 3.3 mmol/L — ABNORMAL LOW (ref 3.5–5.1)
Sodium: 136 mmol/L (ref 135–145)
Total Bilirubin: 0.7 mg/dL (ref 0.3–1.2)
Total Protein: 7.1 g/dL (ref 6.5–8.1)

## 2020-12-31 LAB — MAGNESIUM: Magnesium: 2 mg/dL (ref 1.7–2.4)

## 2020-12-31 MED ORDER — BORTEZOMIB CHEMO SQ INJECTION 3.5 MG (2.5MG/ML)
1.3000 mg/m2 | Freq: Once | INTRAMUSCULAR | Status: AC
  Administered 2020-12-31: 14:00:00 2.75 mg via SUBCUTANEOUS
  Filled 2020-12-31: qty 1.1

## 2020-12-31 MED ORDER — DEXAMETHASONE 4 MG PO TABS
40.0000 mg | ORAL_TABLET | Freq: Once | ORAL | Status: AC
  Administered 2020-12-31: 12:00:00 40 mg via ORAL
  Filled 2020-12-31: qty 10

## 2020-12-31 MED ORDER — DARATUMUMAB-HYALURONIDASE-FIHJ 1800-30000 MG-UT/15ML ~~LOC~~ SOLN
1800.0000 mg | Freq: Once | SUBCUTANEOUS | Status: AC
  Administered 2020-12-31: 13:00:00 1800 mg via SUBCUTANEOUS
  Filled 2020-12-31: qty 15

## 2020-12-31 MED ORDER — ACETAMINOPHEN 325 MG PO TABS
650.0000 mg | ORAL_TABLET | Freq: Once | ORAL | Status: AC
  Administered 2020-12-31: 12:00:00 650 mg via ORAL
  Filled 2020-12-31: qty 2

## 2020-12-31 MED ORDER — DIPHENHYDRAMINE HCL 25 MG PO CAPS
25.0000 mg | ORAL_CAPSULE | Freq: Once | ORAL | Status: AC
  Administered 2020-12-31: 12:00:00 25 mg via ORAL
  Filled 2020-12-31: qty 1

## 2020-12-31 NOTE — Telephone Encounter (Signed)
Chart reviewed. Revlimid refilled per last office note with Dr. Katragadda.  

## 2020-12-31 NOTE — Progress Notes (Signed)
Patient presents today for Velcade and Daratumumab injections per providers order.  Vital signs and labs within parameters for treatment.  Patient has no new complaints at this time.  Velcade and Daratumumab administration without incident; injection site WNL; see MAR for injection details.  Patient tolerated procedure well and without incident.  No questions or complaints noted at this time.  Discharge from clinic ambulatory in stable condition.  Alert and oriented X 3.  Follow up with [facility] as scheduled.

## 2020-12-31 NOTE — Patient Instructions (Signed)
Eugene [name]  Discharge Instructions: Thank you for choosing [facility] to provide your oncology and hematology care.  If you have a lab appointment with the [facility], please come in thru the Main Entrance and check in at the main information desk.  Wear comfortable clothing and clothing appropriate for easy access to any Portacath or PICC line.   We strive to give you quality time with your provider. You may need to reschedule your appointment if you arrive late (15 or more minutes).  Arriving late affects you and other patients whose appointments are after yours.  Also, if you miss three or more appointments without notifying the office, you may be dismissed from the clinic at the providers discretion.      For prescription refill requests, have your pharmacy contact our office and allow 72 hours for refills to be completed.    Today you received the following chemotherapy and/or immunotherapy agents Velcade and Daratumumab      To help prevent nausea and vomiting after your treatment, we encourage you to take your nausea medication as directed.  BELOW ARE SYMPTOMS THAT SHOULD BE REPORTED IMMEDIATELY: *FEVER GREATER THAN 100.4 F (38 C) OR HIGHER *CHILLS OR SWEATING *NAUSEA AND VOMITING THAT IS NOT CONTROLLED WITH YOUR NAUSEA MEDICATION *UNUSUAL SHORTNESS OF BREATH *UNUSUAL BRUISING OR BLEEDING *URINARY PROBLEMS (pain or burning when urinating, or frequent urination) *BOWEL PROBLEMS (unusual diarrhea, constipation, pain near the anus) TENDERNESS IN MOUTH AND THROAT WITH OR WITHOUT PRESENCE OF ULCERS (sore throat, sores in mouth, or a toothache) UNUSUAL RASH, SWELLING OR PAIN  UNUSUAL VAGINAL DISCHARGE OR ITCHING   Items with * indicate a potential emergency and should be followed up as soon as possible or go to the Emergency Department if any problems should occur.  Please show the CHEMOTHERAPY ALERT CARD or IMMUNOTHERAPY ALERT CARD at check-in to the  Emergency Department and triage nurse.  Should you have questions after your visit or need to cancel or reschedule your appointment, please contact [facility] [phone]  and follow the prompts.  Office hours are 8:00 a.m. to 4:30 p.m. Monday - Friday. Please note that voicemails left after 4:00 p.m. may not be returned until the following business day.  We are closed weekends and major holidays. You have access to a nurse at all times for urgent questions. Please call the main number to the clinic [phone] and follow the prompts.  For any non-urgent questions, you may also contact your provider using MyChart. We now offer e-Visits for anyone 2 and older to request care online for non-urgent symptoms. For details visit [email or web address].   Also download the MyChart app! Go to the app store, search "MyChart", open the app, select [facility], and log in with your MyChart username and password.  Due to Covid, a mask is required upon entering the hospital/clinic. If you do not have a mask, one will be given to you upon arrival. For doctor visits, patients may have 1 support person aged 63 or older with them. For treatment visits, patients cannot have anyone with them due to current Covid guidelines and our immunocompromised population.

## 2021-01-07 ENCOUNTER — Other Ambulatory Visit (HOSPITAL_COMMUNITY): Payer: 59

## 2021-01-08 ENCOUNTER — Inpatient Hospital Stay (HOSPITAL_COMMUNITY): Payer: 59

## 2021-01-08 ENCOUNTER — Other Ambulatory Visit: Payer: Self-pay

## 2021-01-08 DIAGNOSIS — Z5112 Encounter for antineoplastic immunotherapy: Secondary | ICD-10-CM | POA: Diagnosis not present

## 2021-01-08 DIAGNOSIS — C9000 Multiple myeloma not having achieved remission: Secondary | ICD-10-CM | POA: Diagnosis not present

## 2021-01-08 LAB — COMPREHENSIVE METABOLIC PANEL
ALT: 18 U/L (ref 0–44)
AST: 23 U/L (ref 15–41)
Albumin: 3.9 g/dL (ref 3.5–5.0)
Alkaline Phosphatase: 57 U/L (ref 38–126)
Anion gap: 6 (ref 5–15)
BUN: 15 mg/dL (ref 8–23)
CO2: 26 mmol/L (ref 22–32)
Calcium: 8.6 mg/dL — ABNORMAL LOW (ref 8.9–10.3)
Chloride: 106 mmol/L (ref 98–111)
Creatinine, Ser: 0.92 mg/dL (ref 0.61–1.24)
GFR, Estimated: >60 mL/min (ref >60)
Glucose, Bld: 140 mg/dL — ABNORMAL HIGH (ref 70–99)
Potassium: 3.7 mmol/L (ref 3.5–5.1)
Sodium: 138 mmol/L (ref 135–145)
Total Bilirubin: 0.4 mg/dL (ref 0.3–1.2)
Total Protein: 6.8 g/dL (ref 6.5–8.1)

## 2021-01-08 LAB — CBC WITH DIFFERENTIAL/PLATELET
Abs Immature Granulocytes: 0.01 10*3/uL (ref 0.00–0.07)
Basophils Absolute: 0 10*3/uL (ref 0.0–0.1)
Basophils Relative: 0 %
Eosinophils Absolute: 0.1 10*3/uL (ref 0.0–0.5)
Eosinophils Relative: 1 %
HCT: 38.7 % — ABNORMAL LOW (ref 39.0–52.0)
Hemoglobin: 12.5 g/dL — ABNORMAL LOW (ref 13.0–17.0)
Immature Granulocytes: 0 %
Lymphocytes Relative: 47 %
Lymphs Abs: 2.5 10*3/uL (ref 0.7–4.0)
MCH: 31.8 pg (ref 26.0–34.0)
MCHC: 32.3 g/dL (ref 30.0–36.0)
MCV: 98.5 fL (ref 80.0–100.0)
Monocytes Absolute: 0.6 10*3/uL (ref 0.1–1.0)
Monocytes Relative: 12 %
Neutro Abs: 2.2 10*3/uL (ref 1.7–7.7)
Neutrophils Relative %: 40 %
Platelets: 183 10*3/uL (ref 150–400)
RBC: 3.93 MIL/uL — ABNORMAL LOW (ref 4.22–5.81)
RDW: 14.1 % (ref 11.5–15.5)
WBC: 5.4 10*3/uL (ref 4.0–10.5)
nRBC: 0 % (ref 0.0–0.2)

## 2021-01-08 LAB — MAGNESIUM: Magnesium: 2 mg/dL (ref 1.7–2.4)

## 2021-01-08 LAB — LACTATE DEHYDROGENASE: LDH: 133 U/L (ref 98–192)

## 2021-01-09 LAB — KAPPA/LAMBDA LIGHT CHAINS
Kappa free light chain: 15.8 mg/L (ref 3.3–19.4)
Kappa, lambda light chain ratio: 1.95 — ABNORMAL HIGH (ref 0.26–1.65)
Lambda free light chains: 8.1 mg/L (ref 5.7–26.3)

## 2021-01-10 LAB — PROTEIN ELECTROPHORESIS, SERUM
A/G Ratio: 1.3 (ref 0.7–1.7)
Albumin ELP: 3.5 g/dL (ref 2.9–4.4)
Alpha-1-Globulin: 0.2 g/dL (ref 0.0–0.4)
Alpha-2-Globulin: 0.7 g/dL (ref 0.4–1.0)
Beta Globulin: 1 g/dL (ref 0.7–1.3)
Gamma Globulin: 0.7 g/dL (ref 0.4–1.8)
Globulin, Total: 2.6 g/dL (ref 2.2–3.9)
M-Spike, %: 0.2 g/dL — ABNORMAL HIGH
Total Protein ELP: 6.1 g/dL (ref 6.0–8.5)

## 2021-01-14 DIAGNOSIS — M899 Disorder of bone, unspecified: Secondary | ICD-10-CM | POA: Diagnosis not present

## 2021-01-14 DIAGNOSIS — C9000 Multiple myeloma not having achieved remission: Secondary | ICD-10-CM | POA: Diagnosis not present

## 2021-01-14 DIAGNOSIS — M5137 Other intervertebral disc degeneration, lumbosacral region: Secondary | ICD-10-CM | POA: Diagnosis not present

## 2021-01-14 DIAGNOSIS — I878 Other specified disorders of veins: Secondary | ICD-10-CM | POA: Diagnosis not present

## 2021-01-14 NOTE — Progress Notes (Signed)
Eugene [name], Eugene [name] [id number]   CLINIC:  Medical Oncology/Hematology  PCP:  Eugene Spar, MD [address] / [place] [address] [phone]   REASON FOR VISIT:  Follow-up for multiple myeloma  PRIOR THERAPY: none  NGS Results: not done  CURRENT THERAPY: DaraVRd every 3 weeks x 6 cycles  BRIEF ONCOLOGIC HISTORY:  Oncology History  Multiple myeloma without remission ([place])  07/09/2020 Pathology Results   BONE MARROW, ASPIRATE, CLOT, CORE:  -Hypercellular bone marrow with plasma cell neoplasm  -See comment   PERIPHERAL BLOOD:  -Slight macrocytic anemia   COMMENT:   The bone marrow is hypercellular for age with trilineage hematopoiesis and nonspecific myeloid changes.  In this background, the plasma cells are increased in number representing 10% of all cells associated with atypical cytomorphologic features.  The plasma cells display kappa light chain restriction consistent with plasma cell neoplasm.  Correlation with cytogenetic and FISH studies is recommended.    07/24/2020 Initial Diagnosis   Multiple myeloma without remission ([place])   07/24/2020 Cancer Staging   Staging form: Plasma Cell Myeloma and Plasma Cell Disorders, AJCC 8th Edition - Clinical stage from 07/24/2020: RISS Stage I (Beta-2-microglobulin (mg/L): 2.1, Albumin (g/dL): 3.7, ISS: Stage I, High-risk cytogenetics: Absent, LDH: Normal) - Signed by Heath Lark, MD on 07/24/2020 Stage prefix: Initial diagnosis Beta 2 microglobulin range (mg/L): Less than 3.5 Albumin range (g/dL): Greater than or equal to 3.5 Cytogenetics: No abnormalities    09/03/2020 -  Chemotherapy   Patient is on Treatment Plan : MYELOMA NEWLY DIAGNOSED TRANSPLANT CANDIDATE DaraVRd (Daratumumab SQ) q21d x 6 Cycles (Induction/Consolidation)       CANCER STAGING:  Cancer Staging  Multiple myeloma without remission ([place]) Staging form: Plasma Cell Myeloma and Plasma Cell Disorders, AJCC 8th  Edition - Clinical stage from 07/24/2020: RISS Stage I (Beta-2-microglobulin (mg/L): 2.1, Albumin (g/dL): 3.7, ISS: Stage I, High-risk cytogenetics: Absent, LDH: Normal) - Signed by Heath Lark, MD on 07/24/2020   INTERVAL HISTORY:  Eugene [name], a 66 y.o. male, returns for routine follow-up and consideration for next cycle of chemotherapy. Eugene [name] was last seen on 12/24/2020.  Due for cycle #7 of DaraVRd today.   Overall, he tells me he has been feeling pretty well. He denies numbness/tingling, leg pain, n/v/d, fevers, and infections. He started back on Revlimid on 12/29. He reports mild ankle swellings.   Overall, he feels ready for next cycle of chemo today.   REVIEW OF SYSTEMS:  Review of Systems  Constitutional:  Negative for appetite change, fatigue and fever.  Cardiovascular:  Positive for leg swelling (mild).  Gastrointestinal:  Negative for diarrhea, nausea and vomiting.  Musculoskeletal:  Negative for arthralgias.  Neurological:  Negative for numbness.  All other systems reviewed and are negative.  PAST MEDICAL/SURGICAL HISTORY:  Past Medical History:  Diagnosis Date   Back pain    CAD (coronary artery disease)    a. 01/2017 NSTEMI/Cath: LM nl, LAD 25p - ? hypodense but no filling defect, RI nl, LCX nl, RCA nl, EF 55-65%-->Med Rx.   Erectile dysfunction    History of echocardiogram    a. 01/2017 Echo: EF 55-60%, no rwma.   Hypertension    Past Surgical History:  Procedure Laterality Date   LEFT HEART CATH AND CORONARY ANGIOGRAPHY N/A 01/15/2017   Procedure: LEFT HEART CATH AND CORONARY ANGIOGRAPHY;  Surgeon: Martinique, Peter M, MD;  Location: [facility];  Service: Cardiovascular;  Laterality: N/A;  ORIF trocanteric femoral IM Nail Right     SOCIAL HISTORY:  Social History   Socioeconomic History   Marital status: Significant Other    Spouse name: Not on file   Number of children: Not on file   Years of education: Not on file   Highest education level:  Not on file  Occupational History   Not on file  Tobacco Use   Smoking status: Never   Smokeless tobacco: Never  Vaping Use   Vaping Use: Never used  Substance and Sexual Activity   Alcohol use: No   Drug use: No   Sexual activity: Yes  Other Topics Concern   Not on file  Social History Narrative   Not on file   Social Determinants of Health   Financial Resource Strain: Low Risk    Difficulty of Paying Living Expenses: Not hard at all  Food Insecurity: No Food Insecurity   Worried About Charity fundraiser in the Last Year: Never true   [place] in the Last Year: Never true  Transportation Needs: No Transportation Needs   Lack of Transportation (Medical): No   Lack of Transportation (Non-Medical): No  Physical Activity: Insufficiently Active   Days of Exercise per Week: 2 days   Minutes of Exercise per Session: 10 min  Stress: No Stress Concern Present   Feeling of Stress : Not at all  Social Connections: Moderately Integrated   Frequency of Communication with Friends and Family: More than three times a week   Frequency of Social Gatherings with Friends and Family: More than three times a week   Attends Religious Services: 1 to 4 times per year   Active Member of [employer] or Organizations: No   Attends Music therapist: 1 to 4 times per year   Marital Status: Divorced  Human resources officer Violence: Not At Risk   Fear of Current or Ex-Partner: No   Emotionally Abused: No   Physically Abused: No   Sexually Abused: No    FAMILY HISTORY:  Family History  Problem Relation Age of Onset   Cancer Mother        breast   Diabetes Mother    Diabetes Sister    Hypertension Sister    Cancer Brother    Mental illness Sister     CURRENT MEDICATIONS:  Current Outpatient Medications  Medication Sig Dispense Refill   acyclovir (ZOVIRAX) 400 MG tablet Take 1 tablet (400 mg total) by mouth 2 (two) times daily. 60 tablet 5   aspirin EC 81 MG tablet Take 1 tablet  (81 mg total) by mouth daily with breakfast. 30 tablet 5   BORTEZOMIB IJ Inject as directed once a week.     calcium-vitamin D (OSCAL WITH D) 500-200 MG-UNIT tablet Take 1 tablet by mouth.     DARATUMUMAB-HYALURONIDASE-FIHJ [place] Inject into the skin once a week.     dexamethasone (DECADRON) 4 MG tablet Take 10 tablets (40 mg total) by mouth once a week. 40 tablet 6   metoprolol succinate (TOPROL-XL) 25 MG 24 hr tablet TAKE 1 TABLET BY MOUTH DAILY. (Patient taking differently: Take 25 mg by mouth daily.) 90 tablet 3   Multiple Vitamin (MULTIVITAMIN) tablet Take 1 tablet by mouth daily.       REVLIMID 25 MG capsule TAKE 1 CAPSULE BY MOUTH ONCE DAILY FOR 14 DAYS ON AND 7 DAYS OFF 14 capsule 0   rosuvastatin (CRESTOR) 40 MG tablet Take 1 tablet (40 mg total) by  mouth every other day. 90 tablet 1   traMADol (ULTRAM) 50 MG tablet Take 1 tablet (50 mg total) by mouth every 6 (six) hours as needed. 60 tablet 0   nitroGLYCERIN (NITROSTAT) 0.4 MG SL tablet Place 1 tablet (0.4 mg total) under the tongue every 5 (five) minutes for 3 doses as needed for chest pain. (Patient not taking: Reported on 01/15/2021) 25 tablet 3   No current facility-administered medications for this visit.    ALLERGIES:  No Known Allergies  PHYSICAL EXAM:  Performance status (ECOG): 1 - Symptomatic but completely ambulatory  Vitals:   01/15/21 1303  BP: 133/78  Pulse: 70  Resp: 18  Temp: 98.3 F (36.8 C)  SpO2: 100%   Wt Readings from Last 3 Encounters:  01/15/21 230 lb 12.8 oz (104.7 kg)  12/31/20 227 lb 12.8 oz (103.3 kg)  12/24/20 227 lb 11.8 oz (103.3 kg)   Physical Exam Vitals reviewed.  Constitutional:      Appearance: Normal appearance.  Cardiovascular:     Rate and Rhythm: Normal rate and regular rhythm.     Pulses: Normal pulses.     Heart sounds: Normal heart sounds.  Pulmonary:     Effort: Pulmonary effort is normal.     Breath sounds: Normal breath sounds.  Musculoskeletal:     Right lower leg:  Edema (trace) present.     Left lower leg: Edema (trace) present.  Neurological:     General: No focal deficit present.     Mental Status: He is alert and oriented to person, place, and time.  Psychiatric:        Mood and Affect: Mood normal.        Behavior: Behavior normal.    LABORATORY DATA:  I have reviewed the labs as listed.  CBC Latest Ref Rng & Units 01/08/2021 12/31/2020 12/24/2020  WBC 4.0 - 10.5 K/uL 5.4 6.2 5.3  Hemoglobin 13.0 - 17.0 g/dL 12.5(L) 13.1 13.3  Hematocrit 39.0 - 52.0 % 38.7(L) 38.7(L) 39.7  Platelets 150 - 400 K/uL 183 244 198   CMP Latest Ref Rng & Units 01/08/2021 12/31/2020 12/24/2020  Glucose 70 - 99 mg/dL 140(H) 142(H) 117(H)  BUN 8 - 23 mg/dL $Remove'15 12 10  'DaKBtHQ$ Creatinine 0.61 - 1.24 mg/dL 0.92 0.95 0.97  Sodium 135 - 145 mmol/L 138 136 137  Potassium 3.5 - 5.1 mmol/L 3.7 3.3(L) 3.3(L)  Chloride 98 - 111 mmol/L 106 102 102  CO2 22 - 32 mmol/L $RemoveB'26 26 24  'EZSrjWIr$ Calcium 8.9 - 10.3 mg/dL 8.6(L) 8.5(L) 8.9  Total Protein 6.5 - 8.1 g/dL 6.8 7.1 6.9  Total Bilirubin 0.3 - 1.2 mg/dL 0.4 0.7 0.8  Alkaline Phos 38 - 126 U/L 57 63 60  AST 15 - 41 U/L $Remo'23 23 24  'sPLhO$ ALT 0 - 44 U/L $Remo'18 20 18    'lLjxu$ DIAGNOSTIC IMAGING:  I have independently reviewed the scans and discussed with the patient. No results found.   ASSESSMENT:  1.  Standard risk plasma cell myeloma: - He reported low back pain radiating to the right thigh since April, pain gets worse when he walks for more than 2 blocks. - He had history of MGUS and was last seen in our clinic in 2019.  He had an abnormal M spike of 3.2 g on 12/06/2018. - Skeletal survey on 10/22/2017 showed no suspicious lytic lesions. - X-ray of the right sided pelvis on 06/11/2020 showed prominent lytic lesions noted in the proximal midportion of the right femoral diaphysis  with no evidence of fracture or dislocation. - Bone marrow biopsy on 07/09/2020 with hypercellular marrow with trilineage hematopoiesis.  Plasma cells representing 10% of  cells. - Chromosome analysis 46, XY (20).  Multiple myeloma FISH panel negative. - PET scan on 07/04/2020 with multiple hypermetabolic bony soft tissue lesions.  Dominant lesions in the L5 vertebral body and posterior right acetabulum and distal right femur.  Focal increased uptake in the right tonsillar region with associated soft tissue fullness on CT imaging. - 24-hour urine with total protein 134. - Beta-2 microglobulin 2.1 (06/26/2020), LDH 136. - Dara RVD cycle 1 started on 09/03/2020.  2.  Social/family history: - He works at [employer] in environmental services. - He was never smoker. - 2 brothers died of metastatic lung cancer.  Sister has thyroid cancer.  Mother had breast cancer.   PLAN:  1.  Stage I standard risk IgG kappa multiple myeloma: - He has completed 6 cycles of Dara RVD. - We reviewed myeloma labs from 01/08/2021.  M spike is still stable at 0.2 g, unchanged from last time.  Free light chain ratio is 1.95.  Kappa light chains are normal at 15.8. - Dr. Quentin Ore at [facility] recommended stem cell collection when his M spike is close to 0. - Hands have recommended proceeding with 1-2 more cycles of Dara RVD. - He does not report any side effects from the treatment. - He has started Revlimid 25 mg 2 weeks on/1 week off on 01/09/2021. - Denies any tingling or numbness or any pains.  No infections.  He will proceed with Velcade and daratumumab today. - We will plan to repeat myeloma labs in 3 weeks and RTC 4 weeks.  2.  Right mid thigh pain: - He had right femur ORIF in July 2022.  Radiation was not done as he was started on treatment for myeloma.  He denies any pain at this time.  He reportedly followed up with his surgeon with x-rays last week which were normal.  3.  Infection prophylaxis: - Continue acyclovir 400 mg twice daily. - Continue aspirin 81 mg daily.  4.  Myeloma bone disease: - Calcium is normal.  He will continue Zometa every 4 weeks.   Continue calcium and vitamin D supplements.   Orders placed this encounter:  No orders of the defined types were placed in this encounter.    Derek Jack, MD [facility] [phone]   I, Thana Ates, am acting as a scribe for Dr. Derek Jack.  I, Derek Jack MD, have reviewed the above documentation for accuracy and completeness, and I agree with the above.

## 2021-01-15 ENCOUNTER — Inpatient Hospital Stay (HOSPITAL_COMMUNITY): Payer: 59 | Attending: Hematology | Admitting: Hematology

## 2021-01-15 ENCOUNTER — Ambulatory Visit (HOSPITAL_COMMUNITY): Payer: 59 | Admitting: Hematology

## 2021-01-15 ENCOUNTER — Other Ambulatory Visit: Payer: Self-pay

## 2021-01-15 ENCOUNTER — Inpatient Hospital Stay (HOSPITAL_COMMUNITY): Payer: 59

## 2021-01-15 VITALS — BP 133/78 | HR 70 | Temp 98.3°F | Resp 18 | Ht 68.9 in | Wt 230.8 lb

## 2021-01-15 DIAGNOSIS — C9000 Multiple myeloma not having achieved remission: Secondary | ICD-10-CM

## 2021-01-15 DIAGNOSIS — E876 Hypokalemia: Secondary | ICD-10-CM | POA: Diagnosis not present

## 2021-01-15 DIAGNOSIS — M899 Disorder of bone, unspecified: Secondary | ICD-10-CM

## 2021-01-15 DIAGNOSIS — Z5112 Encounter for antineoplastic immunotherapy: Secondary | ICD-10-CM | POA: Insufficient documentation

## 2021-01-15 MED ORDER — DARATUMUMAB-HYALURONIDASE-FIHJ 1800-30000 MG-UT/15ML ~~LOC~~ SOLN
1800.0000 mg | Freq: Once | SUBCUTANEOUS | Status: AC
  Administered 2021-01-15: 1800 mg via SUBCUTANEOUS
  Filled 2021-01-15: qty 15

## 2021-01-15 MED ORDER — DIPHENHYDRAMINE HCL 25 MG PO CAPS
25.0000 mg | ORAL_CAPSULE | Freq: Once | ORAL | Status: AC
  Administered 2021-01-15: 25 mg via ORAL
  Filled 2021-01-15: qty 1

## 2021-01-15 MED ORDER — DEXAMETHASONE 4 MG PO TABS
40.0000 mg | ORAL_TABLET | Freq: Once | ORAL | Status: AC
  Administered 2021-01-15: 40 mg via ORAL
  Filled 2021-01-15: qty 10

## 2021-01-15 MED ORDER — BORTEZOMIB CHEMO SQ INJECTION 3.5 MG (2.5MG/ML)
1.3000 mg/m2 | Freq: Once | INTRAMUSCULAR | Status: AC
  Administered 2021-01-15: 2.75 mg via SUBCUTANEOUS
  Filled 2021-01-15: qty 1.1

## 2021-01-15 MED ORDER — ACETAMINOPHEN 325 MG PO TABS
650.0000 mg | ORAL_TABLET | Freq: Once | ORAL | Status: AC
  Administered 2021-01-15: 650 mg via ORAL
  Filled 2021-01-15: qty 2

## 2021-01-15 NOTE — Patient Instructions (Signed)
[facility]  Discharge Instructions: Thank you for choosing [facility] to provide your oncology and hematology care.  If you have a lab appointment with the [facility], please come in thru the Main Entrance and check in at the main information desk.  Wear comfortable clothing and clothing appropriate for easy access to any Portacath or PICC line.   We strive to give you quality time with your provider. You may need to reschedule your appointment if you arrive late (15 or more minutes).  Arriving late affects you and other patients whose appointments are after yours.  Also, if you miss three or more appointments without notifying the office, you may be dismissed from the clinic at the providers discretion.      For prescription refill requests, have your pharmacy contact our office and allow 72 hours for refills to be completed.    Today you received Dara  and Velcade injections.     BELOW ARE SYMPTOMS THAT SHOULD BE REPORTED IMMEDIATELY: *FEVER GREATER THAN 100.4 F (38 C) OR HIGHER *CHILLS OR SWEATING *NAUSEA AND VOMITING THAT IS NOT CONTROLLED WITH YOUR NAUSEA MEDICATION *UNUSUAL SHORTNESS OF BREATH *UNUSUAL BRUISING OR BLEEDING *URINARY PROBLEMS (pain or burning when urinating, or frequent urination) *BOWEL PROBLEMS (unusual diarrhea, constipation, pain near the anus) TENDERNESS IN MOUTH AND THROAT WITH OR WITHOUT PRESENCE OF ULCERS (sore throat, sores in mouth, or a toothache) UNUSUAL RASH, SWELLING OR PAIN  UNUSUAL VAGINAL DISCHARGE OR ITCHING   Items with * indicate a potential emergency and should be followed up as soon as possible or go to the Emergency Department if any problems should occur.  Please show the CHEMOTHERAPY ALERT CARD or IMMUNOTHERAPY ALERT CARD at check-in to the Emergency Department and triage nurse.  Should you have questions after your visit or need to cancel or reschedule your appointment, please contact [facility] [phone]  and follow the prompts.  Office hours are 8:00 a.m. to 4:30 p.m. Monday - Friday. Please note that voicemails left after 4:00 p.m. may not be returned until the following business day.  We are closed weekends and major holidays. You have access to a nurse at all times for urgent questions. Please call the main number to the clinic [phone] and follow the prompts.  For any non-urgent questions, you may also contact your provider using MyChart. We now offer e-Visits for anyone 17 and older to request care online for non-urgent symptoms. For details visit [email or web address].   Also download the MyChart app! Go to the app store, search "MyChart", open the app, select [facility], and log in with your MyChart username and password.  Due to Covid, a mask is required upon entering the hospital/clinic. If you do not have a mask, one will be given to you upon arrival. For doctor visits, patients may have 1 support person aged 78 or older with them. For treatment visits, patients cannot have anyone with them due to current Covid guidelines and our immunocompromised population.

## 2021-01-15 NOTE — Patient Instructions (Signed)
[facility] at [facility] Discharge Instructions   You were seen and examined today by Dr. Delton Coombes. We will proceed with your treatment today, next week, and in 2 weeks. We will repeat your lab work in 3 weeks and see you back in 4 weeks. Return as scheduled.    Thank you for choosing [facility] at [facility] to provide your oncology and hematology care.  To afford each patient quality time with our provider, please arrive at least 15 minutes before your scheduled appointment time.   If you have a lab appointment with the [facility] please come in thru the Main Entrance and check in at the main information desk.  You need to re-schedule your appointment should you arrive 10 or more minutes late.  We strive to give you quality time with our providers, and arriving late affects you and other patients whose appointments are after yours.  Also, if you no show three or more times for appointments you may be dismissed from the clinic at the providers discretion.     Again, thank you for choosing [facility].  Our hope is that these requests will decrease the amount of time that you wait before being seen by our physicians.       _____________________________________________________________  Should you have questions after your visit to [facility], please contact our office at [phone] and follow the prompts.  Our office hours are 8:00 a.m. and 4:30 p.m. Monday - Friday.  Please note that voicemails left after 4:00 p.m. may not be returned until the following business day.  We are closed weekends and major holidays.  You do have access to a nurse 24-7, just call the main number to the clinic [phone] and do not press any options, hold on the line and a nurse will answer the phone.    For prescription refill requests, have your pharmacy contact our office and allow 72 hours.    Due to Covid, you will need to wear  a mask upon entering the hospital. If you do not have a mask, a mask will be given to you at the Main Entrance upon arrival. For doctor visits, patients may have 1 support person age 64 or older with them. For treatment visits, patients can not have anyone with them due to social distancing guidelines and our immunocompromised population.

## 2021-01-15 NOTE — Progress Notes (Signed)
Patient is taking Revlime as prescribed.  He has not missed any doses and reports no side effects at this time.    Patient has been examined by Dr. Delton Coombes, and vital signs and labs have been reviewed. ANC, Creatinine, LFTs, hemoglobin, and platelets are within treatment parameters per M.D. - pt may proceed with treatment.

## 2021-01-15 NOTE — Progress Notes (Signed)
Pt presents today for Dara [place] and Velcade injection per provider's order. Vital signs and labs WNL for treatment. Okay to proceed with treatment per Dr.K.  Dara [place] and Velcade injections given today per MD orders. Tolerated infusion without adverse affects. Vital signs stable. No complaints at this time. Discharged from clinic ambulatory in stable condition. Alert and oriented x 3. F/U with [facility] as scheduled.

## 2021-01-21 ENCOUNTER — Other Ambulatory Visit (HOSPITAL_COMMUNITY): Payer: Self-pay

## 2021-01-22 ENCOUNTER — Other Ambulatory Visit: Payer: Self-pay

## 2021-01-22 ENCOUNTER — Inpatient Hospital Stay (HOSPITAL_COMMUNITY): Payer: 59

## 2021-01-22 ENCOUNTER — Other Ambulatory Visit (HOSPITAL_COMMUNITY): Payer: Self-pay | Admitting: Hematology

## 2021-01-22 ENCOUNTER — Encounter (HOSPITAL_COMMUNITY): Payer: Self-pay

## 2021-01-22 ENCOUNTER — Encounter (HOSPITAL_COMMUNITY): Payer: Self-pay | Admitting: Emergency Medicine

## 2021-01-22 ENCOUNTER — Telehealth (HOSPITAL_COMMUNITY): Payer: Self-pay | Admitting: Emergency Medicine

## 2021-01-22 VITALS — BP 144/63 | HR 74 | Temp 97.4°F | Resp 18 | Ht 68.75 in | Wt 234.0 lb

## 2021-01-22 DIAGNOSIS — E876 Hypokalemia: Secondary | ICD-10-CM

## 2021-01-22 DIAGNOSIS — Z5112 Encounter for antineoplastic immunotherapy: Secondary | ICD-10-CM | POA: Diagnosis not present

## 2021-01-22 DIAGNOSIS — C9000 Multiple myeloma not having achieved remission: Secondary | ICD-10-CM

## 2021-01-22 LAB — CBC WITH DIFFERENTIAL/PLATELET
Abs Immature Granulocytes: 0.04 10*3/uL (ref 0.00–0.07)
Basophils Absolute: 0 10*3/uL (ref 0.0–0.1)
Basophils Relative: 0 %
Eosinophils Absolute: 0.2 10*3/uL (ref 0.0–0.5)
Eosinophils Relative: 3 %
HCT: 37.5 % — ABNORMAL LOW (ref 39.0–52.0)
Hemoglobin: 12.7 g/dL — ABNORMAL LOW (ref 13.0–17.0)
Immature Granulocytes: 1 %
Lymphocytes Relative: 42 %
Lymphs Abs: 2.2 10*3/uL (ref 0.7–4.0)
MCH: 33.7 pg (ref 26.0–34.0)
MCHC: 33.9 g/dL (ref 30.0–36.0)
MCV: 99.5 fL (ref 80.0–100.0)
Monocytes Absolute: 0.8 10*3/uL (ref 0.1–1.0)
Monocytes Relative: 15 %
Neutro Abs: 2.1 10*3/uL (ref 1.7–7.7)
Neutrophils Relative %: 39 %
Platelets: 194 10*3/uL (ref 150–400)
RBC: 3.77 MIL/uL — ABNORMAL LOW (ref 4.22–5.81)
RDW: 14.2 % (ref 11.5–15.5)
WBC: 5.3 10*3/uL (ref 4.0–10.5)
nRBC: 0 % (ref 0.0–0.2)

## 2021-01-22 LAB — MAGNESIUM: Magnesium: 1.8 mg/dL (ref 1.7–2.4)

## 2021-01-22 LAB — COMPREHENSIVE METABOLIC PANEL
ALT: 18 U/L (ref 0–44)
AST: 22 U/L (ref 15–41)
Albumin: 3.7 g/dL (ref 3.5–5.0)
Alkaline Phosphatase: 59 U/L (ref 38–126)
Anion gap: 7 (ref 5–15)
BUN: 13 mg/dL (ref 8–23)
CO2: 28 mmol/L (ref 22–32)
Calcium: 8.7 mg/dL — ABNORMAL LOW (ref 8.9–10.3)
Chloride: 102 mmol/L (ref 98–111)
Creatinine, Ser: 0.95 mg/dL (ref 0.61–1.24)
GFR, Estimated: >60 mL/min (ref >60)
Glucose, Bld: 118 mg/dL — ABNORMAL HIGH (ref 70–99)
Potassium: 3.3 mmol/L — ABNORMAL LOW (ref 3.5–5.1)
Sodium: 137 mmol/L (ref 135–145)
Total Bilirubin: 1 mg/dL (ref 0.3–1.2)
Total Protein: 6.4 g/dL — ABNORMAL LOW (ref 6.5–8.1)

## 2021-01-22 MED ORDER — ZOLEDRONIC ACID 4 MG/100ML IV SOLN
4.0000 mg | Freq: Once | INTRAVENOUS | Status: AC
  Administered 2021-01-22: 4 mg via INTRAVENOUS
  Filled 2021-01-22: qty 100

## 2021-01-22 MED ORDER — DEXAMETHASONE 4 MG PO TABS
40.0000 mg | ORAL_TABLET | Freq: Once | ORAL | Status: AC
  Administered 2021-01-22: 40 mg via ORAL
  Filled 2021-01-22: qty 10

## 2021-01-22 MED ORDER — BORTEZOMIB CHEMO SQ INJECTION 3.5 MG (2.5MG/ML)
1.3000 mg/m2 | Freq: Once | INTRAMUSCULAR | Status: AC
  Administered 2021-01-22: 2.75 mg via SUBCUTANEOUS
  Filled 2021-01-22: qty 1.1

## 2021-01-22 MED ORDER — SODIUM CHLORIDE 0.9 % IV SOLN: INTRAVENOUS | Status: DC

## 2021-01-22 MED ORDER — POTASSIUM CHLORIDE CRYS ER 20 MEQ PO TBCR
40.0000 meq | EXTENDED_RELEASE_TABLET | Freq: Once | ORAL | Status: AC
  Administered 2021-01-22: 40 meq via ORAL
  Filled 2021-01-22: qty 2

## 2021-01-22 MED ORDER — PROCHLORPERAZINE MALEATE 10 MG PO TABS
10.0000 mg | ORAL_TABLET | Freq: Once | ORAL | Status: AC
  Administered 2021-01-22: 10 mg via ORAL
  Filled 2021-01-22: qty 1

## 2021-01-22 NOTE — Patient Instructions (Signed)
[place]  Discharge Instructions: Thank you for choosing [facility] to provide your oncology and hematology care.  If you have a lab appointment with the [facility], please come in thru the Main Entrance and check in at the main information desk.  Wear comfortable clothing and clothing appropriate for easy access to any Portacath or PICC line.   We strive to give you quality time with your provider. You may need to reschedule your appointment if you arrive late (15 or more minutes).  Arriving late affects you and other patients whose appointments are after yours.  Also, if you miss three or more appointments without notifying the office, you may be dismissed from the clinic at the providers discretion.      For prescription refill requests, have your pharmacy contact our office and allow 72 hours for refills to be completed.    Today you received the following chemotherapy and/or immunotherapy agents Velcade/Zometa, return as scheduled.   To help prevent nausea and vomiting after your treatment, we encourage you to take your nausea medication as directed.  BELOW ARE SYMPTOMS THAT SHOULD BE REPORTED IMMEDIATELY: *FEVER GREATER THAN 100.4 F (38 C) OR HIGHER *CHILLS OR SWEATING *NAUSEA AND VOMITING THAT IS NOT CONTROLLED WITH YOUR NAUSEA MEDICATION *UNUSUAL SHORTNESS OF BREATH *UNUSUAL BRUISING OR BLEEDING *URINARY PROBLEMS (pain or burning when urinating, or frequent urination) *BOWEL PROBLEMS (unusual diarrhea, constipation, pain near the anus) TENDERNESS IN MOUTH AND THROAT WITH OR WITHOUT PRESENCE OF ULCERS (sore throat, sores in mouth, or a toothache) UNUSUAL RASH, SWELLING OR PAIN  UNUSUAL VAGINAL DISCHARGE OR ITCHING   Items with * indicate a potential emergency and should be followed up as soon as possible or go to the Emergency Department if any problems should occur.  Please show the CHEMOTHERAPY ALERT CARD or IMMUNOTHERAPY ALERT CARD at check-in  to the Emergency Department and triage nurse.  Should you have questions after your visit or need to cancel or reschedule your appointment, please contact [facility] [phone]  and follow the prompts.  Office hours are 8:00 a.m. to 4:30 p.m. Monday - Friday. Please note that voicemails left after 4:00 p.m. may not be returned until the following business day.  We are closed weekends and major holidays. You have access to a nurse at all times for urgent questions. Please call the main number to the clinic [phone] and follow the prompts.  For any non-urgent questions, you may also contact your provider using MyChart. We now offer e-Visits for anyone 13 and older to request care online for non-urgent symptoms. For details visit [email or web address].   Also download the MyChart app! Go to the app store, search "MyChart", open the app, select [facility], and log in with your MyChart username and password.  Due to Covid, a mask is required upon entering the hospital/clinic. If you do not have a mask, one will be given to you upon arrival. For doctor visits, patients may have 1 support person aged 56 or older with them. For treatment visits, patients cannot have anyone with them due to current Covid guidelines and our immunocompromised population.

## 2021-01-22 NOTE — Telephone Encounter (Signed)
Chart reviewed. Revlimid refilled per last office note with Dr. Katragadda.  

## 2021-01-22 NOTE — Progress Notes (Signed)
Patient presents today for Velcade/Zometa, labs within treatment parameters, potassium 3.3, 40 mEq of potassium PO ordered per standing orders.  Patient tolerated therapy with no complaints voiced. Labs reviewed. Side effects with management reviewed with understanding verbalized. Peripheral IV site clean and dry with no bruising or swelling noted at site. Good blood return noted before and after administration of therapy. Band aid applied.  Patient tolerated Velcade injection with no complaints voiced. Lab work reviewed. See MAR for details. Injection site clean and dry with no bruising or swelling noted. Patient stable during and after injection. Band aid applied. VSS. Patient left in satisfactory condition with no s/s of distress noted.

## 2021-01-22 NOTE — Progress Notes (Signed)
[employer] paperwork update and emailed to [employer]  Verified with Caryl Pina that it was correct on 01/21/21.  HIM on 01/22/21.  Pt will be given original at next appt.

## 2021-01-22 NOTE — Telephone Encounter (Signed)
error 

## 2021-01-29 ENCOUNTER — Inpatient Hospital Stay (HOSPITAL_COMMUNITY): Payer: 59

## 2021-01-29 ENCOUNTER — Other Ambulatory Visit: Payer: Self-pay

## 2021-01-29 VITALS — BP 137/70 | HR 68 | Temp 97.1°F | Resp 18 | Ht 68.5 in | Wt 226.6 lb

## 2021-01-29 DIAGNOSIS — C9000 Multiple myeloma not having achieved remission: Secondary | ICD-10-CM

## 2021-01-29 DIAGNOSIS — Z5112 Encounter for antineoplastic immunotherapy: Secondary | ICD-10-CM | POA: Diagnosis not present

## 2021-01-29 LAB — CBC WITH DIFFERENTIAL/PLATELET
Abs Immature Granulocytes: 0.02 10*3/uL (ref 0.00–0.07)
Basophils Absolute: 0 10*3/uL (ref 0.0–0.1)
Basophils Relative: 1 %
Eosinophils Absolute: 0.1 10*3/uL (ref 0.0–0.5)
Eosinophils Relative: 1 %
HCT: 37.2 % — ABNORMAL LOW (ref 39.0–52.0)
Hemoglobin: 12.4 g/dL — ABNORMAL LOW (ref 13.0–17.0)
Immature Granulocytes: 0 %
Lymphocytes Relative: 35 %
Lymphs Abs: 2.5 10*3/uL (ref 0.7–4.0)
MCH: 33.1 pg (ref 26.0–34.0)
MCHC: 33.3 g/dL (ref 30.0–36.0)
MCV: 99.2 fL (ref 80.0–100.0)
Monocytes Absolute: 1.2 10*3/uL — ABNORMAL HIGH (ref 0.1–1.0)
Monocytes Relative: 16 %
Neutro Abs: 3.4 10*3/uL (ref 1.7–7.7)
Neutrophils Relative %: 47 %
Platelets: 205 10*3/uL (ref 150–400)
RBC: 3.75 MIL/uL — ABNORMAL LOW (ref 4.22–5.81)
RDW: 13.8 % (ref 11.5–15.5)
WBC: 7.2 10*3/uL (ref 4.0–10.5)
nRBC: 0 % (ref 0.0–0.2)

## 2021-01-29 LAB — MAGNESIUM: Magnesium: 2.1 mg/dL (ref 1.7–2.4)

## 2021-01-29 LAB — COMPREHENSIVE METABOLIC PANEL
ALT: 31 U/L (ref 0–44)
AST: 31 U/L (ref 15–41)
Albumin: 3.7 g/dL (ref 3.5–5.0)
Alkaline Phosphatase: 60 U/L (ref 38–126)
Anion gap: 9 (ref 5–15)
BUN: 14 mg/dL (ref 8–23)
CO2: 26 mmol/L (ref 22–32)
Calcium: 8.9 mg/dL (ref 8.9–10.3)
Chloride: 102 mmol/L (ref 98–111)
Creatinine, Ser: 1.04 mg/dL (ref 0.61–1.24)
GFR, Estimated: >60 mL/min (ref >60)
Glucose, Bld: 132 mg/dL — ABNORMAL HIGH (ref 70–99)
Potassium: 4 mmol/L (ref 3.5–5.1)
Sodium: 137 mmol/L (ref 135–145)
Total Bilirubin: 1 mg/dL (ref 0.3–1.2)
Total Protein: 6.9 g/dL (ref 6.5–8.1)

## 2021-01-29 MED ORDER — DIPHENHYDRAMINE HCL 25 MG PO CAPS
25.0000 mg | ORAL_CAPSULE | Freq: Once | ORAL | Status: AC
  Administered 2021-01-29: 25 mg via ORAL
  Filled 2021-01-29: qty 1

## 2021-01-29 MED ORDER — DARATUMUMAB-HYALURONIDASE-FIHJ 1800-30000 MG-UT/15ML ~~LOC~~ SOLN
1800.0000 mg | Freq: Once | SUBCUTANEOUS | Status: AC
  Administered 2021-01-29: 1800 mg via SUBCUTANEOUS
  Filled 2021-01-29: qty 15

## 2021-01-29 MED ORDER — ACETAMINOPHEN 325 MG PO TABS
650.0000 mg | ORAL_TABLET | Freq: Once | ORAL | Status: AC
  Administered 2021-01-29: 650 mg via ORAL
  Filled 2021-01-29: qty 2

## 2021-01-29 MED ORDER — DEXAMETHASONE 4 MG PO TABS
40.0000 mg | ORAL_TABLET | Freq: Once | ORAL | Status: AC
  Administered 2021-01-29: 40 mg via ORAL
  Filled 2021-01-29: qty 10

## 2021-01-29 MED ORDER — BORTEZOMIB CHEMO SQ INJECTION 3.5 MG (2.5MG/ML)
1.3000 mg/m2 | Freq: Once | INTRAMUSCULAR | Status: AC
  Administered 2021-01-29: 2.75 mg via SUBCUTANEOUS
  Filled 2021-01-29: qty 1.1

## 2021-01-29 NOTE — Patient Instructions (Signed)
[facility]  Discharge Instructions: Thank you for choosing [facility] to provide your oncology and hematology care.  If you have a lab appointment with the [facility], please come in thru the Main Entrance and check in at the main information desk.  Wear comfortable clothing and clothing appropriate for easy access to any Portacath or PICC line.   We strive to give you quality time with your provider. You may need to reschedule your appointment if you arrive late (15 or more minutes).  Arriving late affects you and other patients whose appointments are after yours.  Also, if you miss three or more appointments without notifying the office, you may be dismissed from the clinic at the providers discretion.      For prescription refill requests, have your pharmacy contact our office and allow 72 hours for refills to be completed.    Today you received the following chemotherapy and/or immunotherapy agents : Dara [place] and Velcade.       To help prevent nausea and vomiting after your treatment, we encourage you to take your nausea medication as directed.  BELOW ARE SYMPTOMS THAT SHOULD BE REPORTED IMMEDIATELY: *FEVER GREATER THAN 100.4 F (38 C) OR HIGHER *CHILLS OR SWEATING *NAUSEA AND VOMITING THAT IS NOT CONTROLLED WITH YOUR NAUSEA MEDICATION *UNUSUAL SHORTNESS OF BREATH *UNUSUAL BRUISING OR BLEEDING *URINARY PROBLEMS (pain or burning when urinating, or frequent urination) *BOWEL PROBLEMS (unusual diarrhea, constipation, pain near the anus) TENDERNESS IN MOUTH AND THROAT WITH OR WITHOUT PRESENCE OF ULCERS (sore throat, sores in mouth, or a toothache) UNUSUAL RASH, SWELLING OR PAIN  UNUSUAL VAGINAL DISCHARGE OR ITCHING   Items with * indicate a potential emergency and should be followed up as soon as possible or go to the Emergency Department if any problems should occur.  Please show the CHEMOTHERAPY ALERT CARD or IMMUNOTHERAPY ALERT CARD at check-in to the  Emergency Department and triage nurse.  Should you have questions after your visit or need to cancel or reschedule your appointment, please contact [facility] [phone]  and follow the prompts.  Office hours are 8:00 a.m. to 4:30 p.m. Monday - Friday. Please note that voicemails left after 4:00 p.m. may not be returned until the following business day.  We are closed weekends and major holidays. You have access to a nurse at all times for urgent questions. Please call the main number to the clinic [phone] and follow the prompts.  For any non-urgent questions, you may also contact your provider using MyChart. We now offer e-Visits for anyone 14 and older to request care online for non-urgent symptoms. For details visit [email or web address].   Also download the MyChart app! Go to the app store, search "MyChart", open the app, select [facility], and log in with your MyChart username and password.  Due to Covid, a mask is required upon entering the hospital/clinic. If you do not have a mask, one will be given to you upon arrival. For doctor visits, patients may have 1 support person aged 7 or older with them. For treatment visits, patients cannot have anyone with them due to current Covid guidelines and our immunocompromised population.

## 2021-01-29 NOTE — Progress Notes (Signed)
Patient presents today for treatment. Vital signs and lab work reviewed and within parameters for today's treatment. MAR reviewed and updated. Patient denies any side effects from treatment and has no complaints today. Patient states, " I started back to work and I worked five days in a row and had some back pain. " Patient denies back pain today. Patient states he took a Tramadol for his back pain and pain resolved.   Treatment given today per MD orders. Tolerated without adverse affects. Vital signs stable. No complaints at this time. Discharged from clinic ambulatory in stable condition. Alert and oriented x 3. F/U with [facility] as scheduled.

## 2021-02-05 ENCOUNTER — Inpatient Hospital Stay (HOSPITAL_COMMUNITY): Payer: 59

## 2021-02-05 ENCOUNTER — Other Ambulatory Visit: Payer: Self-pay

## 2021-02-05 VITALS — BP 120/63 | HR 58 | Temp 96.0°F | Resp 18 | Ht 68.5 in | Wt 229.8 lb

## 2021-02-05 DIAGNOSIS — C9000 Multiple myeloma not having achieved remission: Secondary | ICD-10-CM | POA: Diagnosis not present

## 2021-02-05 DIAGNOSIS — Z5112 Encounter for antineoplastic immunotherapy: Secondary | ICD-10-CM | POA: Diagnosis not present

## 2021-02-05 LAB — COMPREHENSIVE METABOLIC PANEL
ALT: 25 U/L (ref 0–44)
AST: 27 U/L (ref 15–41)
Albumin: 3.7 g/dL (ref 3.5–5.0)
Alkaline Phosphatase: 50 U/L (ref 38–126)
Anion gap: 8 (ref 5–15)
BUN: 14 mg/dL (ref 8–23)
CO2: 28 mmol/L (ref 22–32)
Calcium: 8.8 mg/dL — ABNORMAL LOW (ref 8.9–10.3)
Chloride: 101 mmol/L (ref 98–111)
Creatinine, Ser: 0.92 mg/dL (ref 0.61–1.24)
GFR, Estimated: >60 mL/min (ref >60)
Glucose, Bld: 111 mg/dL — ABNORMAL HIGH (ref 70–99)
Potassium: 3.4 mmol/L — ABNORMAL LOW (ref 3.5–5.1)
Sodium: 137 mmol/L (ref 135–145)
Total Bilirubin: 0.6 mg/dL (ref 0.3–1.2)
Total Protein: 6.6 g/dL (ref 6.5–8.1)

## 2021-02-05 LAB — LACTATE DEHYDROGENASE: LDH: 139 U/L (ref 98–192)

## 2021-02-05 LAB — MAGNESIUM: Magnesium: 1.7 mg/dL (ref 1.7–2.4)

## 2021-02-05 LAB — CBC WITH DIFFERENTIAL/PLATELET
Abs Immature Granulocytes: 0.05 10*3/uL (ref 0.00–0.07)
Basophils Absolute: 0 10*3/uL (ref 0.0–0.1)
Basophils Relative: 0 %
Eosinophils Absolute: 0.2 10*3/uL (ref 0.0–0.5)
Eosinophils Relative: 5 %
HCT: 37.2 % — ABNORMAL LOW (ref 39.0–52.0)
Hemoglobin: 12.4 g/dL — ABNORMAL LOW (ref 13.0–17.0)
Immature Granulocytes: 1 %
Lymphocytes Relative: 45 %
Lymphs Abs: 2.3 10*3/uL (ref 0.7–4.0)
MCH: 33.3 pg (ref 26.0–34.0)
MCHC: 33.3 g/dL (ref 30.0–36.0)
MCV: 100 fL (ref 80.0–100.0)
Monocytes Absolute: 0.3 10*3/uL (ref 0.1–1.0)
Monocytes Relative: 7 %
Neutro Abs: 2.2 10*3/uL (ref 1.7–7.7)
Neutrophils Relative %: 42 %
Platelets: 230 10*3/uL (ref 150–400)
RBC: 3.72 MIL/uL — ABNORMAL LOW (ref 4.22–5.81)
RDW: 14.1 % (ref 11.5–15.5)
WBC: 5.1 10*3/uL (ref 4.0–10.5)
nRBC: 0 % (ref 0.0–0.2)

## 2021-02-05 MED ORDER — DARATUMUMAB-HYALURONIDASE-FIHJ 1800-30000 MG-UT/15ML ~~LOC~~ SOLN: 1800.0000 mg | Freq: Once | SUBCUTANEOUS | Status: DC

## 2021-02-05 MED ORDER — ACETAMINOPHEN 325 MG PO TABS: 650.0000 mg | ORAL_TABLET | Freq: Once | ORAL | Status: DC

## 2021-02-05 MED ORDER — DEXAMETHASONE 4 MG PO TABS
40.0000 mg | ORAL_TABLET | Freq: Once | ORAL | Status: AC
  Administered 2021-02-05: 15:00:00 40 mg via ORAL
  Filled 2021-02-05: qty 10

## 2021-02-05 MED ORDER — DIPHENHYDRAMINE HCL 25 MG PO CAPS: 25.0000 mg | ORAL_CAPSULE | Freq: Once | ORAL | Status: DC

## 2021-02-05 MED ORDER — BORTEZOMIB CHEMO SQ INJECTION 3.5 MG (2.5MG/ML)
1.3000 mg/m2 | Freq: Once | INTRAMUSCULAR | Status: AC
  Administered 2021-02-05: 15:00:00 2.75 mg via SUBCUTANEOUS
  Filled 2021-02-05: qty 1.1

## 2021-02-05 NOTE — Patient Instructions (Signed)
[facility]  Discharge Instructions: Thank you for choosing [facility] to provide your oncology and hematology care.  If you have a lab appointment with the [facility], please come in thru the Main Entrance and check in at the main information desk.  Wear comfortable clothing and clothing appropriate for easy access to any Portacath or PICC line.   We strive to give you quality time with your provider. You may need to reschedule your appointment if you arrive late (15 or more minutes).  Arriving late affects you and other patients whose appointments are after yours.  Also, if you miss three or more appointments without notifying the office, you may be dismissed from the clinic at the providers discretion.      For prescription refill requests, have your pharmacy contact our office and allow 72 hours for refills to be completed.    Today you received the following chemotherapy : Velcade injection.       To help prevent nausea and vomiting after your treatment, we encourage you to take your nausea medication as directed.  BELOW ARE SYMPTOMS THAT SHOULD BE REPORTED IMMEDIATELY: *FEVER GREATER THAN 100.4 F (38 C) OR HIGHER *CHILLS OR SWEATING *NAUSEA AND VOMITING THAT IS NOT CONTROLLED WITH YOUR NAUSEA MEDICATION *UNUSUAL SHORTNESS OF BREATH *UNUSUAL BRUISING OR BLEEDING *URINARY PROBLEMS (pain or burning when urinating, or frequent urination) *BOWEL PROBLEMS (unusual diarrhea, constipation, pain near the anus) TENDERNESS IN MOUTH AND THROAT WITH OR WITHOUT PRESENCE OF ULCERS (sore throat, sores in mouth, or a toothache) UNUSUAL RASH, SWELLING OR PAIN  UNUSUAL VAGINAL DISCHARGE OR ITCHING   Items with * indicate a potential emergency and should be followed up as soon as possible or go to the Emergency Department if any problems should occur.  Please show the CHEMOTHERAPY ALERT CARD or IMMUNOTHERAPY ALERT CARD at check-in to the Emergency Department and triage  nurse.  Should you have questions after your visit or need to cancel or reschedule your appointment, please contact [facility] [phone]  and follow the prompts.  Office hours are 8:00 a.m. to 4:30 p.m. Monday - Friday. Please note that voicemails left after 4:00 p.m. may not be returned until the following business day.  We are closed weekends and major holidays. You have access to a nurse at all times for urgent questions. Please call the main number to the clinic [phone] and follow the prompts.  For any non-urgent questions, you may also contact your provider using MyChart. We now offer e-Visits for anyone 65 and older to request care online for non-urgent symptoms. For details visit [email or web address].   Also download the MyChart app! Go to the app store, search "MyChart", open the app, select [facility], and log in with your MyChart username and password.  Due to Covid, a mask is required upon entering the hospital/clinic. If you do not have a mask, one will be given to you upon arrival. For doctor visits, patients may have 1 support person aged 29 or older with them. For treatment visits, patients cannot have anyone with them due to current Covid guidelines and our immunocompromised population.

## 2021-02-05 NOTE — Progress Notes (Unsigned)
Patient presents today for tx. Vital signs and labs within parameters for today's treatment. Patient has no complaints of any changes since his last visit. MAR reviewed and updated. Patient taking Revlimid as prescribed with no complaints of any side effects.   Per K. Newsome [facility] corrected careplan for dates and regimen order. D1 Velcade only , Day 8 Velcade and Dara, D15 Velcade only. Per Dr. Antoine Poche. Newsome RPH.   Treatment given today per MD orders. Tolerated without adverse affects. Vital signs stable. No complaints at this time. Discharged from clinic ambulatory in stable condition. Alert and oriented x 3. F/U with [facility] as scheduled.

## 2021-02-05 NOTE — Progress Notes (Signed)
Correct careplan dates and orders for cycle 8 D1 velcade only D8 Velcade and dara D15 velcade only

## 2021-02-06 LAB — KAPPA/LAMBDA LIGHT CHAINS
Kappa free light chain: 20.2 mg/L — ABNORMAL HIGH (ref 3.3–19.4)
Kappa, lambda light chain ratio: 2.08 — ABNORMAL HIGH (ref 0.26–1.65)
Lambda free light chains: 9.7 mg/L (ref 5.7–26.3)

## 2021-02-07 LAB — PROTEIN ELECTROPHORESIS, SERUM
A/G Ratio: 1.2 (ref 0.7–1.7)
Albumin ELP: 3.2 g/dL (ref 2.9–4.4)
Alpha-1-Globulin: 0.3 g/dL (ref 0.0–0.4)
Alpha-2-Globulin: 0.7 g/dL (ref 0.4–1.0)
Beta Globulin: 0.9 g/dL (ref 0.7–1.3)
Gamma Globulin: 0.7 g/dL (ref 0.4–1.8)
Globulin, Total: 2.6 g/dL (ref 2.2–3.9)
M-Spike, %: 0.2 g/dL — ABNORMAL HIGH
Total Protein ELP: 5.8 g/dL — ABNORMAL LOW (ref 6.0–8.5)

## 2021-02-12 ENCOUNTER — Inpatient Hospital Stay (HOSPITAL_COMMUNITY): Payer: 59 | Admitting: Hematology

## 2021-02-12 ENCOUNTER — Other Ambulatory Visit (HOSPITAL_COMMUNITY): Payer: Self-pay | Admitting: Hematology

## 2021-02-12 ENCOUNTER — Inpatient Hospital Stay (HOSPITAL_COMMUNITY): Payer: 59

## 2021-02-12 ENCOUNTER — Inpatient Hospital Stay (HOSPITAL_COMMUNITY): Payer: 59 | Attending: Hematology

## 2021-02-12 ENCOUNTER — Other Ambulatory Visit: Payer: Self-pay

## 2021-02-12 VITALS — BP 126/73 | HR 65 | Temp 97.1°F | Resp 18 | Ht 69.29 in | Wt 231.0 lb

## 2021-02-12 DIAGNOSIS — C9000 Multiple myeloma not having achieved remission: Secondary | ICD-10-CM | POA: Diagnosis not present

## 2021-02-12 DIAGNOSIS — Z5112 Encounter for antineoplastic immunotherapy: Secondary | ICD-10-CM | POA: Insufficient documentation

## 2021-02-12 DIAGNOSIS — M899 Disorder of bone, unspecified: Secondary | ICD-10-CM | POA: Diagnosis not present

## 2021-02-12 DIAGNOSIS — E876 Hypokalemia: Secondary | ICD-10-CM | POA: Diagnosis not present

## 2021-02-12 LAB — COMPREHENSIVE METABOLIC PANEL
ALT: 25 U/L (ref 0–44)
AST: 28 U/L (ref 15–41)
Albumin: 3.8 g/dL (ref 3.5–5.0)
Alkaline Phosphatase: 53 U/L (ref 38–126)
Anion gap: 5 (ref 5–15)
BUN: 12 mg/dL (ref 8–23)
CO2: 28 mmol/L (ref 22–32)
Calcium: 8.4 mg/dL — ABNORMAL LOW (ref 8.9–10.3)
Chloride: 104 mmol/L (ref 98–111)
Creatinine, Ser: 0.97 mg/dL (ref 0.61–1.24)
GFR, Estimated: >60 mL/min (ref >60)
Glucose, Bld: 122 mg/dL — ABNORMAL HIGH (ref 70–99)
Potassium: 3.4 mmol/L — ABNORMAL LOW (ref 3.5–5.1)
Sodium: 137 mmol/L (ref 135–145)
Total Bilirubin: 0.9 mg/dL (ref 0.3–1.2)
Total Protein: 6.8 g/dL (ref 6.5–8.1)

## 2021-02-12 LAB — CBC WITH DIFFERENTIAL/PLATELET
Abs Immature Granulocytes: 0.05 10*3/uL (ref 0.00–0.07)
Basophils Absolute: 0 10*3/uL (ref 0.0–0.1)
Basophils Relative: 0 %
Eosinophils Absolute: 0.2 10*3/uL (ref 0.0–0.5)
Eosinophils Relative: 3 %
HCT: 39.2 % (ref 39.0–52.0)
Hemoglobin: 12.9 g/dL — ABNORMAL LOW (ref 13.0–17.0)
Immature Granulocytes: 1 %
Lymphocytes Relative: 32 %
Lymphs Abs: 2 10*3/uL (ref 0.7–4.0)
MCH: 33.2 pg (ref 26.0–34.0)
MCHC: 32.9 g/dL (ref 30.0–36.0)
MCV: 100.8 fL — ABNORMAL HIGH (ref 80.0–100.0)
Monocytes Absolute: 0.8 10*3/uL (ref 0.1–1.0)
Monocytes Relative: 13 %
Neutro Abs: 3.1 10*3/uL (ref 1.7–7.7)
Neutrophils Relative %: 51 %
Platelets: 234 10*3/uL (ref 150–400)
RBC: 3.89 MIL/uL — ABNORMAL LOW (ref 4.22–5.81)
RDW: 14.2 % (ref 11.5–15.5)
WBC: 6.1 10*3/uL (ref 4.0–10.5)
nRBC: 0 % (ref 0.0–0.2)

## 2021-02-12 LAB — MAGNESIUM: Magnesium: 2 mg/dL (ref 1.7–2.4)

## 2021-02-12 MED ORDER — BORTEZOMIB CHEMO SQ INJECTION 3.5 MG (2.5MG/ML)
1.3000 mg/m2 | Freq: Once | INTRAMUSCULAR | Status: AC
  Administered 2021-02-12: 2.75 mg via SUBCUTANEOUS
  Filled 2021-02-12: qty 1.1

## 2021-02-12 MED ORDER — ACETAMINOPHEN 325 MG PO TABS
650.0000 mg | ORAL_TABLET | Freq: Once | ORAL | Status: AC
  Administered 2021-02-12: 650 mg via ORAL
  Filled 2021-02-12: qty 2

## 2021-02-12 MED ORDER — DARATUMUMAB-HYALURONIDASE-FIHJ 1800-30000 MG-UT/15ML ~~LOC~~ SOLN
1800.0000 mg | Freq: Once | SUBCUTANEOUS | Status: AC
  Administered 2021-02-12: 1800 mg via SUBCUTANEOUS
  Filled 2021-02-12: qty 15

## 2021-02-12 MED ORDER — DIPHENHYDRAMINE HCL 25 MG PO CAPS
25.0000 mg | ORAL_CAPSULE | Freq: Once | ORAL | Status: AC
  Administered 2021-02-12: 25 mg via ORAL
  Filled 2021-02-12: qty 1

## 2021-02-12 MED ORDER — DEXAMETHASONE 4 MG PO TABS
40.0000 mg | ORAL_TABLET | Freq: Once | ORAL | Status: AC
  Administered 2021-02-12: 40 mg via ORAL
  Filled 2021-02-12: qty 10

## 2021-02-12 NOTE — Progress Notes (Signed)
° °[facility] °[address] °[place], [place] [address] ° ° °CLINIC:  °Medical Oncology/Hematology ° °PCP:  °[name], Eugene K, MD °[address] / [place] [address] °[phone] ° ° °REASON FOR VISIT:  °Follow-up for multiple myeloma ° °PRIOR THERAPY: none  ° °NGS Results: not done ° °CURRENT THERAPY: DaraVRd every 3 weeks x 6 cycles ° °BRIEF ONCOLOGIC HISTORY:  °Oncology History  °Multiple myeloma without remission (HCC)  °07/09/2020 Pathology Results  ° BONE MARROW, ASPIRATE, CLOT, CORE:  °-Hypercellular bone marrow with plasma cell neoplasm  °-See comment  ° °PERIPHERAL BLOOD:  °-Slight macrocytic anemia  ° °COMMENT:  ° °The bone marrow is hypercellular for age with trilineage hematopoiesis and nonspecific myeloid changes.  In this background, the plasma cells are increased in number representing 10% of all cells associated with atypical cytomorphologic features.  The plasma cells display kappa light chain restriction consistent with plasma cell neoplasm.  Correlation with cytogenetic and FISH studies is recommended.  °  °07/24/2020 Initial Diagnosis  ° Multiple myeloma without remission (HCC) °  °07/24/2020 Cancer Staging  ° Staging form: Plasma Cell Myeloma and Plasma Cell Disorders, AJCC 8th Edition °- Clinical stage from 07/24/2020: RISS Stage I (Beta-2-microglobulin (mg/L): 2.1, Albumin (g/dL): 3.7, ISS: Stage I, High-risk cytogenetics: Absent, LDH: Normal) - Signed by Gorsuch, Ni, MD on 07/24/2020 °Stage prefix: Initial diagnosis °Beta 2 microglobulin range (mg/L): Less than 3.5 °Albumin range (g/dL): Greater than or equal to 3.5 °Cytogenetics: No abnormalities ° °  °09/03/2020 -  Chemotherapy  ° Patient is on Treatment Plan : MYELOMA NEWLY DIAGNOSED TRANSPLANT CANDIDATE DaraVRd (Daratumumab SQ) q21d x 6 Cycles (Induction/Consolidation)  °   ° ° °CANCER STAGING: ° Cancer Staging  °Multiple myeloma without remission (HCC) °Staging form: Plasma Cell Myeloma and Plasma Cell Disorders, AJCC 8th  Edition °- Clinical stage from 07/24/2020: RISS Stage I (Beta-2-microglobulin (mg/L): 2.1, Albumin (g/dL): 3.7, ISS: Stage I, High-risk cytogenetics: Absent, LDH: Normal) - Signed by Gorsuch, Ni, MD on 07/24/2020 ° ° °INTERVAL HISTORY:  °[name], a 66 y.o. male, returns for routine follow-up and consideration for next cycle of chemotherapy. [name] was last seen on 01/15/2021. ° °Due for day #8 cycle #8 of Velcade and Darzalex Faspro today.  ° °Overall, he tells me he has been feeling pretty well. He denies tingling/numbness, n/v/d, and leg pain. He reports occasional leg swellings. He took his last tablet of Revlimid before his week off today. He reports lower back pain for which he takes Tramadol prn starting following returning to work.  ° °Overall, he feels ready for next cycle of chemo today.  ° ° °REVIEW OF SYSTEMS:  °Review of Systems  °Constitutional:  Negative for appetite change and fatigue.  °Cardiovascular:  Positive for leg swelling (occasional).  °Gastrointestinal:  Negative for diarrhea, nausea and vomiting.  °Musculoskeletal:  Positive for back pain (5/10).  °Neurological:  Negative for numbness.  °All other systems reviewed and are negative. ° °PAST MEDICAL/SURGICAL HISTORY:  °Past Medical History:  °Diagnosis Date  ° Back pain   ° CAD (coronary artery disease)   ° a. 01/2017 NSTEMI/Cath: LM nl, LAD 25p - ? hypodense but no filling defect, RI nl, LCX nl, RCA nl, EF 55-65%-->Med Rx.  ° Erectile dysfunction   ° History of echocardiogram   ° a. 01/2017 Echo: EF 55-60%, no rwma.  ° Hypertension   ° °Past Surgical History:  °Procedure Laterality Date  ° LEFT HEART CATH AND CORONARY ANGIOGRAPHY N/A 01/15/2017  °   Procedure: LEFT HEART CATH AND CORONARY ANGIOGRAPHY;  Surgeon: Martinique, Peter M, MD;  Location: [facility];  Service: Cardiovascular;  Laterality: N/A;   ORIF trocanteric femoral IM Nail Right     SOCIAL HISTORY:  Social History   Socioeconomic History   Marital status:  Significant Other    Spouse name: Not on file   Number of children: Not on file   Years of education: Not on file   Highest education level: Not on file  Occupational History   Not on file  Tobacco Use   Smoking status: Never   Smokeless tobacco: Never  Vaping Use   Vaping Use: Never used  Substance and Sexual Activity   Alcohol use: No   Drug use: No   Sexual activity: Yes  Other Topics Concern   Not on file  Social History Narrative   Not on file   Social Determinants of Health   Financial Resource Strain: Low Risk    Difficulty of Paying Living Expenses: Not hard at all  Food Insecurity: No Food Insecurity   Worried About Charity fundraiser in the Last Year: Never true   Ran Out of Food in the Last Year: Never true  Transportation Needs: No Transportation Needs   Lack of Transportation (Medical): No   Lack of Transportation (Non-Medical): No  Physical Activity: Insufficiently Active   Days of Exercise per Week: 2 days   Minutes of Exercise per Session: 10 min  Stress: No Stress Concern Present   Feeling of Stress : Not at all  Social Connections: Moderately Integrated   Frequency of Communication with Friends and Family: More than three times a week   Frequency of Social Gatherings with Friends and Family: More than three times a week   Attends Religious Services: 1 to 4 times per year   Active Member of [employer] or Organizations: No   Attends Music therapist: 1 to 4 times per year   Marital Status: Divorced  Human resources officer Violence: Not At Risk   Fear of Current or Ex-Partner: No   Emotionally Abused: No   Physically Abused: No   Sexually Abused: No    FAMILY HISTORY:  Family History  Problem Relation Age of Onset   Cancer Mother        breast   Diabetes Mother    Diabetes Sister    Hypertension Sister    Cancer Brother    Mental illness Sister     CURRENT MEDICATIONS:  Current Outpatient Medications  Medication Sig Dispense Refill    acyclovir (ZOVIRAX) 400 MG tablet Take 1 tablet (400 mg total) by mouth 2 (two) times daily. 60 tablet 5   aspirin EC 81 MG tablet Take 1 tablet (81 mg total) by mouth daily with breakfast. 30 tablet 5   BORTEZOMIB IJ Inject as directed once a week.     calcium-vitamin D (OSCAL WITH D) 500-200 MG-UNIT tablet Take 1 tablet by mouth.     DARATUMUMAB-HYALURONIDASE-FIHJ [place] Inject into the skin once a week.     dexamethasone (DECADRON) 4 MG tablet Take 10 tablets (40 mg total) by mouth once a week. 40 tablet 6   metoprolol succinate (TOPROL-XL) 25 MG 24 hr tablet TAKE 1 TABLET BY MOUTH DAILY. (Patient taking differently: Take 25 mg by mouth daily.) 90 tablet 3   Multiple Vitamin (MULTIVITAMIN) tablet Take 1 tablet by mouth daily.       nitroGLYCERIN (NITROSTAT) 0.4 MG SL tablet Place 1 tablet (  0.4 mg total) under the tongue every 5 (five) minutes for 3 doses as needed for chest pain. 25 tablet 3   rosuvastatin (CRESTOR) 40 MG tablet Take 1 tablet (40 mg total) by mouth every other day. 90 tablet 1   traMADol (ULTRAM) 50 MG tablet Take 1 tablet (50 mg total) by mouth every 6 (six) hours as needed. 60 tablet 0   REVLIMID 25 MG capsule TAKE 1 CAPSULE BY MOUTH ONCE DAILY FOR 14 DAYS ON AND 7 DAYS OFF 14 capsule 0   No current facility-administered medications for this visit.    ALLERGIES:  No Known Allergies  PHYSICAL EXAM:  Performance status (ECOG): 1 - Symptomatic but completely ambulatory  Vitals:   02/12/21 1033  BP: 126/73  Pulse: 65  Resp: 18  Temp: (!) 97.1 F (36.2 C)  SpO2: 96%   Wt Readings from Last 3 Encounters:  02/12/21 231 lb (104.8 kg)  02/05/21 229 lb 12.8 oz (104.2 kg)  01/29/21 226 lb 9.6 oz (102.8 kg)   Physical Exam Vitals reviewed.  Constitutional:      Appearance: Normal appearance.  Cardiovascular:     Rate and Rhythm: Normal rate and regular rhythm.     Pulses: Normal pulses.     Heart sounds: Normal heart sounds.  Pulmonary:     Effort: Pulmonary effort  is normal.     Breath sounds: Normal breath sounds.  Musculoskeletal:     Right lower leg: No edema.     Left lower leg: No edema.  Neurological:     General: No focal deficit present.     Mental Status: He is alert and oriented to person, place, and time.  Psychiatric:        Mood and Affect: Mood normal.        Behavior: Behavior normal.    LABORATORY DATA:  I have reviewed the labs as listed.  CBC Latest Ref Rng & Units 02/12/2021 02/05/2021 01/29/2021  WBC 4.0 - 10.5 K/uL 6.1 5.1 7.2  Hemoglobin 13.0 - 17.0 g/dL 12.9(L) 12.4(L) 12.4(L)  Hematocrit 39.0 - 52.0 % 39.2 37.2(L) 37.2(L)  Platelets 150 - 400 K/uL 234 230 205   CMP Latest Ref Rng & Units 02/12/2021 02/05/2021 01/29/2021  Glucose 70 - 99 mg/dL 122(H) 111(H) 132(H)  BUN 8 - 23 mg/dL _0 Creatinine 0.61 - 1.24 mg/dL 0.97 0.92 1.04  Sodium 135 - 145 mmol/L 137 137 137  Potassium 3.5 - 5.1 mmol/L 3.4(L) 3.4(L) 4.0  Chloride 98 - 111 mmol/L 104 101 102  CO2 22 - 32 mmol/L _1 Calcium 8.9 - 10.3 mg/dL 8.4(L) 8.8(L) 8.9  Total Protein 6.5 - 8.1 g/dL 6.8 6.6 6.9  Total Bilirubin 0.3 - 1.2 mg/dL 0.9 0.6 1.0  Alkaline Phos 38 - 126 U/L 53 50 60  AST 15 - 41 U/L _2 ALT 0 - 44 U/L _3 DIAGNOSTIC IMAGING:  I have independently reviewed the scans and discussed with the patient. No results found.   ASSESSMENT:  1.  Standard risk plasma cell myeloma: - He reported low back pain radiating to the right thigh since April, pain gets worse when he walks for more than 2 blocks. - He had history of MGUS and was last seen in our clinic in 2019.  He had an abnormal M spike of 3.2 g on 12/06/2018. - Skeletal survey on 10/22/2017 showed no suspicious lytic lesions. - X-ray of the right  sided pelvis on 06/11/2020 showed prominent lytic lesions noted in the proximal midportion of the right femoral diaphysis with no evidence of fracture or dislocation. °- Bone marrow biopsy on 07/09/2020 with hypercellular marrow with  trilineage hematopoiesis.  Plasma cells representing 10% of cells. °- Chromosome analysis 46, XY (20).  Multiple myeloma FISH panel negative. °- PET scan on 07/04/2020 with multiple hypermetabolic bony soft tissue lesions.  Dominant lesions in the L5 vertebral body and posterior right acetabulum and distal right femur.  Focal increased uptake in the right tonsillar region with associated soft tissue fullness on CT imaging. °- 24-hour urine with total protein 134. °- Beta-2 microglobulin 2.1 (06/26/2020), LDH 136. °- Dara RVD cycle 1 started on 09/03/2020. ° °2.  Social/family history: °- He works at [employer] in environmental services. °- He was never smoker. °- 2 brothers died of metastatic lung cancer.  Sister has thyroid cancer.  Mother had breast cancer. ° ° °PLAN:  °1.  Stage I standard risk IgG kappa multiple myeloma: °- He had completed 7 cycles of Dara RVD. °- We reviewed myeloma labs from 02/05/2021.  M spike is stable at 0.2 g.  Free light chain ratio is 2.08 with kappa light chains 20.2. °- He is tolerating treatment very well.  CBC today was grossly normal.  CMP was also normal. °- Reported some low back pain since he went back to work.  He had to take tramadol during first week and now takes it as needed. °- Part of the M spike could be secondary to daratumumab. °- I will reach out to Dr. Lambert regarding stem cell collection. °- He will proceed with cycle 8-day 8 treatment today. °- RTC 5 weeks for follow-up with repeat myeloma labs. ° °2.  Right mid thigh pain: °- He had right femur ORIF in July 2022.  Radiation was not done as he was started on treatment for myeloma. °- He denies any pain at this time. ° °3.  Infection prophylaxis: °- Continue acyclovir 400 mg twice daily.  Continue aspirin 81 mg daily. ° °4.  Myeloma bone disease: °- Calcium is 8.4.  Continue Zometa every 4 weeks.  Continue calcium and vitamin D. °  °Orders placed this encounter:  °No orders of the defined types were placed  in this encounter. ° ° ° °Sreedhar Katragadda, MD °[facility] °[phone] ° ° °I, Kirstyn Evans, am acting as a scribe for Dr. Sreedhar Katragadda. ° °I, Sreedhar Katragadda MD, have reviewed the above documentation for accuracy and completeness, and I agree with the above. °  ° ° °

## 2021-02-12 NOTE — Telephone Encounter (Signed)
Chart reviewed. Revlimid refilled per last office note with Dr. Katragadda.  

## 2021-02-12 NOTE — Patient Instructions (Signed)
[facility]  Discharge Instructions: Thank you for choosing [facility] to provide your oncology and hematology care.  If you have a lab appointment with the Cancer Center, please come in thru the Main Entrance and check in at the main information desk.  Wear comfortable clothing and clothing appropriate for easy access to any Portacath or PICC line.   We strive to give you quality time with your provider. You may need to reschedule your appointment if you arrive late (15 or more minutes).  Arriving late affects you and other patients whose appointments are after yours.  Also, if you miss three or more appointments without notifying the office, you may be dismissed from the clinic at the provider's discretion.      For prescription refill requests, have your pharmacy contact our office and allow 72 hours for refills to be completed.        To help prevent nausea and vomiting after your treatment, we encourage you to take your nausea medication as directed.  BELOW ARE SYMPTOMS THAT SHOULD BE REPORTED IMMEDIATELY: *FEVER GREATER THAN 100.4 F (38 C) OR HIGHER *CHILLS OR SWEATING *NAUSEA AND VOMITING THAT IS NOT CONTROLLED WITH YOUR NAUSEA MEDICATION *UNUSUAL SHORTNESS OF BREATH *UNUSUAL BRUISING OR BLEEDING *URINARY PROBLEMS (pain or burning when urinating, or frequent urination) *BOWEL PROBLEMS (unusual diarrhea, constipation, pain near the anus) TENDERNESS IN MOUTH AND THROAT WITH OR WITHOUT PRESENCE OF ULCERS (sore throat, sores in mouth, or a toothache) UNUSUAL RASH, SWELLING OR PAIN  UNUSUAL VAGINAL DISCHARGE OR ITCHING   Items with * indicate a potential emergency and should be followed up as soon as possible or go to the Emergency Department if any problems should occur.  Please show the CHEMOTHERAPY ALERT CARD or IMMUNOTHERAPY ALERT CARD at check-in to the Emergency Department and triage nurse.  Should you have questions after your visit or need to cancel  or reschedule your appointment, please contact [facility] [phone]  and follow the prompts.  Office hours are 8:00 a.m. to 4:30 p.m. Monday - Friday. Please note that voicemails left after 4:00 p.m. may not be returned until the following business day.  We are closed weekends and major holidays. You have access to a nurse at all times for urgent questions. Please call the main number to the clinic [phone] and follow the prompts.  For any non-urgent questions, you may also contact your provider using MyChart. We now offer e-Visits for anyone 18 and older to request care online for non-urgent symptoms. For details visit [email or web address].   Also download the MyChart app! Go to the app store, search "MyChart", open the app, select [facility], and log in with your MyChart username and password.  Due to Covid, a mask is required upon entering the hospital/clinic. If you do not have a mask, one will be given to you upon arrival. For doctor visits, patients may have 1 support person aged 18 or older with them. For treatment visits, patients cannot have anyone with them due to current Covid guidelines and our immunocompromised population.  

## 2021-02-12 NOTE — Progress Notes (Signed)
Patient is taking Revlimid as prescribed.  He has not missed any doses and reports no side effects at this time.    Patient has been examined by Dr. Delton Coombes, and vital signs and labs have been reviewed. ANC, Creatinine, LFTs, hemoglobin, and platelets are within treatment parameters per M.D. - pt may proceed with treatment.

## 2021-02-12 NOTE — Patient Instructions (Signed)
[place] [facility] Discharge Instructions   You were seen and examined today by Dr. Delton Coombes.  He reviewed your lab work which is normal/stable.  He will reach out to Dr. Theo Dills at [facility] to inquire about stem cell collection.    We will proceed with your treatment today.  Return as scheduled for lab work and treatment.    Thank you for choosing [place] [facility] to provide your oncology and hematology care.  To afford each patient quality time with our provider, please arrive at least 15 minutes before your scheduled appointment time.   If you have a lab appointment with the [facility] please come in thru the Main Entrance and check in at the main information desk.  You need to re-schedule your appointment should you arrive 10 or more minutes late.  We strive to give you quality time with our providers, and arriving late affects you and other patients whose appointments are after yours.  Also, if you no show three or more times for appointments you may be dismissed from the clinic at the providers discretion.     Again, thank you for choosing [facility].  Our hope is that these requests will decrease the amount of time that you wait before being seen by our physicians.       _____________________________________________________________  Should you have questions after your visit to [facility], please contact our office at [phone] and follow the prompts.  Our office hours are 8:00 a.m. and 4:30 p.m. Monday - Friday.  Please note that voicemails left after 4:00 p.m. may not be returned until the following business day.  We are closed weekends and major holidays.  You do have access to a nurse 24-7, just call the main number to the clinic [phone] and do not press any options, hold on the line and a nurse will answer the phone.    For prescription refill requests, have your pharmacy  contact our office and allow 72 hours.    Due to Covid, you will need to wear a mask upon entering the hospital. If you do not have a mask, a mask will be given to you at the Main Entrance upon arrival. For doctor visits, patients may have 1 support person age 64 or older with them. For treatment visits, patients can not have anyone with them due to social distancing guidelines and our immunocompromised population.

## 2021-02-12 NOTE — Progress Notes (Signed)
Patient presents today for chemotherapy injection.  Patient is in satisfactory condition with no new complaints voiced.  Vital signs are stable.  Labs reviewed by Dr. Delton Coombes during his office visit.  All labs are within treatment parameters.  We will proceed with treatment per MD orders.    Potassium today is 3.4.  We have standing orders per Dr. Delton Coombes to give 40 mEq x one dose.  Patient refused this dose.  He is taking potassium every day at home.   Patient tolerated injections with no complaints voiced.  Site clean and dry with no bruising or swelling noted.  No complaints of pain.  Discharged with vital signs stable and no signs or symptoms of distress noted.

## 2021-02-18 ENCOUNTER — Other Ambulatory Visit (HOSPITAL_COMMUNITY): Payer: Self-pay

## 2021-02-19 ENCOUNTER — Inpatient Hospital Stay (HOSPITAL_COMMUNITY): Payer: 59

## 2021-02-19 ENCOUNTER — Other Ambulatory Visit: Payer: Self-pay

## 2021-02-19 VITALS — BP 144/70 | HR 60 | Temp 96.9°F | Resp 18 | Ht 69.0 in | Wt 228.8 lb

## 2021-02-19 DIAGNOSIS — C9000 Multiple myeloma not having achieved remission: Secondary | ICD-10-CM

## 2021-02-19 DIAGNOSIS — Z5112 Encounter for antineoplastic immunotherapy: Secondary | ICD-10-CM | POA: Diagnosis not present

## 2021-02-19 LAB — CBC WITH DIFFERENTIAL/PLATELET
Abs Immature Granulocytes: 0.01 10*3/uL (ref 0.00–0.07)
Basophils Absolute: 0 10*3/uL (ref 0.0–0.1)
Basophils Relative: 0 %
Eosinophils Absolute: 0.1 10*3/uL (ref 0.0–0.5)
Eosinophils Relative: 2 %
HCT: 38.4 % — ABNORMAL LOW (ref 39.0–52.0)
Hemoglobin: 12.4 g/dL — ABNORMAL LOW (ref 13.0–17.0)
Immature Granulocytes: 0 %
Lymphocytes Relative: 42 %
Lymphs Abs: 2.5 10*3/uL (ref 0.7–4.0)
MCH: 31.7 pg (ref 26.0–34.0)
MCHC: 32.3 g/dL (ref 30.0–36.0)
MCV: 98.2 fL (ref 80.0–100.0)
Monocytes Absolute: 0.9 10*3/uL (ref 0.1–1.0)
Monocytes Relative: 16 %
Neutro Abs: 2.3 10*3/uL (ref 1.7–7.7)
Neutrophils Relative %: 40 %
Platelets: 171 10*3/uL (ref 150–400)
RBC: 3.91 MIL/uL — ABNORMAL LOW (ref 4.22–5.81)
RDW: 14 % (ref 11.5–15.5)
WBC: 5.8 10*3/uL (ref 4.0–10.5)
nRBC: 0 % (ref 0.0–0.2)

## 2021-02-19 LAB — COMPREHENSIVE METABOLIC PANEL
ALT: 21 U/L (ref 0–44)
AST: 24 U/L (ref 15–41)
Albumin: 3.7 g/dL (ref 3.5–5.0)
Alkaline Phosphatase: 56 U/L (ref 38–126)
Anion gap: 6 (ref 5–15)
BUN: 14 mg/dL (ref 8–23)
CO2: 27 mmol/L (ref 22–32)
Calcium: 9 mg/dL (ref 8.9–10.3)
Chloride: 103 mmol/L (ref 98–111)
Creatinine, Ser: 0.92 mg/dL (ref 0.61–1.24)
GFR, Estimated: >60 mL/min (ref >60)
Glucose, Bld: 104 mg/dL — ABNORMAL HIGH (ref 70–99)
Potassium: 3.6 mmol/L (ref 3.5–5.1)
Sodium: 136 mmol/L (ref 135–145)
Total Bilirubin: 0.7 mg/dL (ref 0.3–1.2)
Total Protein: 6.6 g/dL (ref 6.5–8.1)

## 2021-02-19 LAB — MAGNESIUM: Magnesium: 1.9 mg/dL (ref 1.7–2.4)

## 2021-02-19 MED ORDER — SODIUM CHLORIDE 0.9 % IV SOLN: INTRAVENOUS | Status: DC

## 2021-02-19 MED ORDER — BORTEZOMIB CHEMO SQ INJECTION 3.5 MG (2.5MG/ML)
1.3000 mg/m2 | Freq: Once | INTRAMUSCULAR | Status: AC
  Administered 2021-02-19: 2.75 mg via SUBCUTANEOUS
  Filled 2021-02-19: qty 1.1

## 2021-02-19 MED ORDER — DEXAMETHASONE 4 MG PO TABS
40.0000 mg | ORAL_TABLET | Freq: Once | ORAL | Status: AC
  Administered 2021-02-19: 40 mg via ORAL
  Filled 2021-02-19: qty 10

## 2021-02-19 MED ORDER — ZOLEDRONIC ACID 4 MG/100ML IV SOLN
4.0000 mg | Freq: Once | INTRAVENOUS | Status: AC
  Administered 2021-02-19: 4 mg via INTRAVENOUS

## 2021-02-19 MED ORDER — PROCHLORPERAZINE MALEATE 10 MG PO TABS
10.0000 mg | ORAL_TABLET | Freq: Once | ORAL | Status: AC
  Administered 2021-02-19: 10 mg via ORAL
  Filled 2021-02-19: qty 1

## 2021-02-19 NOTE — Patient Instructions (Signed)
[facility]  Discharge Instructions: Thank you for choosing [facility] to provide your oncology and hematology care.  If you have a lab appointment with the Cancer Center, please come in thru the Main Entrance and check in at the main information desk.  Wear comfortable clothing and clothing appropriate for easy access to any Portacath or PICC line.   We strive to give you quality time with your provider. You may need to reschedule your appointment if you arrive late (15 or more minutes).  Arriving late affects you and other patients whose appointments are after yours.  Also, if you miss three or more appointments without notifying the office, you may be dismissed from the clinic at the provider's discretion.      For prescription refill requests, have your pharmacy contact our office and allow 72 hours for refills to be completed.        To help prevent nausea and vomiting after your treatment, we encourage you to take your nausea medication as directed.  BELOW ARE SYMPTOMS THAT SHOULD BE REPORTED IMMEDIATELY: *FEVER GREATER THAN 100.4 F (38 C) OR HIGHER *CHILLS OR SWEATING *NAUSEA AND VOMITING THAT IS NOT CONTROLLED WITH YOUR NAUSEA MEDICATION *UNUSUAL SHORTNESS OF BREATH *UNUSUAL BRUISING OR BLEEDING *URINARY PROBLEMS (pain or burning when urinating, or frequent urination) *BOWEL PROBLEMS (unusual diarrhea, constipation, pain near the anus) TENDERNESS IN MOUTH AND THROAT WITH OR WITHOUT PRESENCE OF ULCERS (sore throat, sores in mouth, or a toothache) UNUSUAL RASH, SWELLING OR PAIN  UNUSUAL VAGINAL DISCHARGE OR ITCHING   Items with * indicate a potential emergency and should be followed up as soon as possible or go to the Emergency Department if any problems should occur.  Please show the CHEMOTHERAPY ALERT CARD or IMMUNOTHERAPY ALERT CARD at check-in to the Emergency Department and triage nurse.  Should you have questions after your visit or need to cancel  or reschedule your appointment, please contact [facility] [phone]  and follow the prompts.  Office hours are 8:00 a.m. to 4:30 p.m. Monday - Friday. Please note that voicemails left after 4:00 p.m. may not be returned until the following business day.  We are closed weekends and major holidays. You have access to a nurse at all times for urgent questions. Please call the main number to the clinic [phone] and follow the prompts.  For any non-urgent questions, you may also contact your provider using MyChart. We now offer e-Visits for anyone 18 and older to request care online for non-urgent symptoms. For details visit [email or web address].   Also download the MyChart app! Go to the app store, search "MyChart", open the app, select , and log in with your MyChart username and password.  Due to Covid, a mask is required upon entering the hospital/clinic. If you do not have a mask, one will be given to you upon arrival. For doctor visits, patients may have 1 support person aged 18 or older with them. For treatment visits, patients cannot have anyone with them due to current Covid guidelines and our immunocompromised population.  

## 2021-02-19 NOTE — Progress Notes (Signed)
Reviewed at treatment visit on 02/19/21 by Dr. Delton Coombes

## 2021-02-19 NOTE — Progress Notes (Signed)
Patient presents today for Velcade injection and Zometa infusion.   Patient is in satisfactory condition with no new complaints voiced.  Vital signs are stable.  Labs reviewed and all are within treatment parameters.  We will proceed with treatment per MD orders.   Patient tolerated infusion and injection well with no complaints voiced.  Patient left ambulatory in stable condition.  Vital signs stable at discharge.  Follow up as scheduled.

## 2021-02-26 ENCOUNTER — Inpatient Hospital Stay (HOSPITAL_COMMUNITY): Payer: 59

## 2021-02-26 ENCOUNTER — Other Ambulatory Visit: Payer: Self-pay

## 2021-02-26 VITALS — BP 136/65 | HR 62 | Temp 96.7°F | Resp 18 | Ht 69.0 in | Wt 231.0 lb

## 2021-02-26 DIAGNOSIS — C9000 Multiple myeloma not having achieved remission: Secondary | ICD-10-CM | POA: Diagnosis not present

## 2021-02-26 DIAGNOSIS — Z5112 Encounter for antineoplastic immunotherapy: Secondary | ICD-10-CM | POA: Diagnosis not present

## 2021-02-26 LAB — CBC WITH DIFFERENTIAL/PLATELET
Abs Immature Granulocytes: 0.02 10*3/uL (ref 0.00–0.07)
Basophils Absolute: 0 10*3/uL (ref 0.0–0.1)
Basophils Relative: 0 %
Eosinophils Absolute: 0.3 10*3/uL (ref 0.0–0.5)
Eosinophils Relative: 7 %
HCT: 36.4 % — ABNORMAL LOW (ref 39.0–52.0)
Hemoglobin: 12.1 g/dL — ABNORMAL LOW (ref 13.0–17.0)
Immature Granulocytes: 0 %
Lymphocytes Relative: 47 %
Lymphs Abs: 2.2 10*3/uL (ref 0.7–4.0)
MCH: 33.2 pg (ref 26.0–34.0)
MCHC: 33.2 g/dL (ref 30.0–36.0)
MCV: 99.7 fL (ref 80.0–100.0)
Monocytes Absolute: 0.4 10*3/uL (ref 0.1–1.0)
Monocytes Relative: 8 %
Neutro Abs: 1.8 10*3/uL (ref 1.7–7.7)
Neutrophils Relative %: 38 %
Platelets: 216 10*3/uL (ref 150–400)
RBC: 3.65 MIL/uL — ABNORMAL LOW (ref 4.22–5.81)
RDW: 13.9 % (ref 11.5–15.5)
WBC: 4.7 10*3/uL (ref 4.0–10.5)
nRBC: 0 % (ref 0.0–0.2)

## 2021-02-26 LAB — COMPREHENSIVE METABOLIC PANEL
ALT: 22 U/L (ref 0–44)
AST: 21 U/L (ref 15–41)
Albumin: 3.6 g/dL (ref 3.5–5.0)
Alkaline Phosphatase: 55 U/L (ref 38–126)
Anion gap: 11 (ref 5–15)
BUN: 15 mg/dL (ref 8–23)
CO2: 23 mmol/L (ref 22–32)
Calcium: 8.7 mg/dL — ABNORMAL LOW (ref 8.9–10.3)
Chloride: 105 mmol/L (ref 98–111)
Creatinine, Ser: 1.12 mg/dL (ref 0.61–1.24)
GFR, Estimated: >60 mL/min (ref >60)
Glucose, Bld: 111 mg/dL — ABNORMAL HIGH (ref 70–99)
Potassium: 3.5 mmol/L (ref 3.5–5.1)
Sodium: 139 mmol/L (ref 135–145)
Total Bilirubin: 0.7 mg/dL (ref 0.3–1.2)
Total Protein: 6.2 g/dL — ABNORMAL LOW (ref 6.5–8.1)

## 2021-02-26 LAB — MAGNESIUM: Magnesium: 1.8 mg/dL (ref 1.7–2.4)

## 2021-02-26 MED ORDER — DEXAMETHASONE 4 MG PO TABS
40.0000 mg | ORAL_TABLET | Freq: Once | ORAL | Status: AC
  Administered 2021-02-26: 40 mg via ORAL
  Filled 2021-02-26: qty 10

## 2021-02-26 MED ORDER — DIPHENHYDRAMINE HCL 25 MG PO CAPS
25.0000 mg | ORAL_CAPSULE | Freq: Once | ORAL | Status: AC
  Administered 2021-02-26: 25 mg via ORAL
  Filled 2021-02-26: qty 1

## 2021-02-26 MED ORDER — ACETAMINOPHEN 325 MG PO TABS
650.0000 mg | ORAL_TABLET | Freq: Once | ORAL | Status: AC
  Administered 2021-02-26: 650 mg via ORAL
  Filled 2021-02-26: qty 2

## 2021-02-26 MED ORDER — BORTEZOMIB CHEMO SQ INJECTION 3.5 MG (2.5MG/ML)
1.3000 mg/m2 | Freq: Once | INTRAMUSCULAR | Status: AC
  Administered 2021-02-26: 2.75 mg via SUBCUTANEOUS
  Filled 2021-02-26: qty 1.1

## 2021-02-26 MED ORDER — DARATUMUMAB-HYALURONIDASE-FIHJ 1800-30000 MG-UT/15ML ~~LOC~~ SOLN
1800.0000 mg | Freq: Once | SUBCUTANEOUS | Status: AC
  Administered 2021-02-26: 1800 mg via SUBCUTANEOUS
  Filled 2021-02-26: qty 15

## 2021-02-26 NOTE — Patient Instructions (Signed)
[facility]  Discharge Instructions: Thank you for choosing [facility] to provide your oncology and hematology care.  If you have a lab appointment with the Cancer Center, please come in thru the Main Entrance and check in at the main information desk.  Wear comfortable clothing and clothing appropriate for easy access to any Portacath or PICC line.   We strive to give you quality time with your provider. You may need to reschedule your appointment if you arrive late (15 or more minutes).  Arriving late affects you and other patients whose appointments are after yours.  Also, if you miss three or more appointments without notifying the office, you may be dismissed from the clinic at the provider's discretion.      For prescription refill requests, have your pharmacy contact our office and allow 72 hours for refills to be completed.        To help prevent nausea and vomiting after your treatment, we encourage you to take your nausea medication as directed.  BELOW ARE SYMPTOMS THAT SHOULD BE REPORTED IMMEDIATELY: *FEVER GREATER THAN 100.4 F (38 C) OR HIGHER *CHILLS OR SWEATING *NAUSEA AND VOMITING THAT IS NOT CONTROLLED WITH YOUR NAUSEA MEDICATION *UNUSUAL SHORTNESS OF BREATH *UNUSUAL BRUISING OR BLEEDING *URINARY PROBLEMS (pain or burning when urinating, or frequent urination) *BOWEL PROBLEMS (unusual diarrhea, constipation, pain near the anus) TENDERNESS IN MOUTH AND THROAT WITH OR WITHOUT PRESENCE OF ULCERS (sore throat, sores in mouth, or a toothache) UNUSUAL RASH, SWELLING OR PAIN  UNUSUAL VAGINAL DISCHARGE OR ITCHING   Items with * indicate a potential emergency and should be followed up as soon as possible or go to the Emergency Department if any problems should occur.  Please show the CHEMOTHERAPY ALERT CARD or IMMUNOTHERAPY ALERT CARD at check-in to the Emergency Department and triage nurse.  Should you have questions after your visit or need to cancel  or reschedule your appointment, please contact [facility] [phone]  and follow the prompts.  Office hours are 8:00 a.m. to 4:30 p.m. Monday - Friday. Please note that voicemails left after 4:00 p.m. may not be returned until the following business day.  We are closed weekends and major holidays. You have access to a nurse at all times for urgent questions. Please call the main number to the clinic [phone] and follow the prompts.  For any non-urgent questions, you may also contact your provider using MyChart. We now offer e-Visits for anyone 18 and older to request care online for non-urgent symptoms. For details visit [email or web address].   Also download the MyChart app! Go to the app store, search "MyChart", open the app, select [facility], and log in with your MyChart username and password.  Due to Covid, a mask is required upon entering the hospital/clinic. If you do not have a mask, one will be given to you upon arrival. For doctor visits, patients may have 1 support person aged 18 or older with them. For treatment visits, patients cannot have anyone with them due to current Covid guidelines and our immunocompromised population.  

## 2021-02-26 NOTE — Progress Notes (Signed)
Patient presents today for Velcade and Dara injections.  Patient is in satisfactory condition with no complaints voiced.  Vital signs are stable.  Labs reviewed and all labs are within treatment parameters.  We will proceed with injections per MD orders.   Patient tolerated treatment well with no complaints voiced.  Patient left ambulatory in stable condition.  Vital signs stable at discharge.  Follow up as scheduled.

## 2021-03-04 DIAGNOSIS — Z01818 Encounter for other preprocedural examination: Secondary | ICD-10-CM | POA: Diagnosis not present

## 2021-03-04 DIAGNOSIS — Z9484 Stem cells transplant status: Secondary | ICD-10-CM | POA: Diagnosis not present

## 2021-03-04 DIAGNOSIS — Z01810 Encounter for preprocedural cardiovascular examination: Secondary | ICD-10-CM | POA: Diagnosis not present

## 2021-03-04 DIAGNOSIS — C9000 Multiple myeloma not having achieved remission: Secondary | ICD-10-CM | POA: Diagnosis not present

## 2021-03-04 DIAGNOSIS — Z7682 Awaiting organ transplant status: Secondary | ICD-10-CM | POA: Diagnosis not present

## 2021-03-05 ENCOUNTER — Encounter (HOSPITAL_COMMUNITY): Payer: Self-pay

## 2021-03-05 ENCOUNTER — Ambulatory Visit (HOSPITAL_COMMUNITY): Payer: 59

## 2021-03-05 ENCOUNTER — Inpatient Hospital Stay (HOSPITAL_COMMUNITY): Payer: 59

## 2021-03-05 NOTE — Progress Notes (Signed)
Patient called clarifying out to take Revlimid in preparation for stem cell collection. Patient instructed to finish current Revlimid cycle and do not restart Revlimid per transplant team. Patient verbalized understanding.

## 2021-03-08 DIAGNOSIS — Z7682 Awaiting organ transplant status: Secondary | ICD-10-CM | POA: Diagnosis not present

## 2021-03-08 DIAGNOSIS — C9000 Multiple myeloma not having achieved remission: Secondary | ICD-10-CM | POA: Diagnosis not present

## 2021-03-08 DIAGNOSIS — Z01818 Encounter for other preprocedural examination: Secondary | ICD-10-CM | POA: Diagnosis not present

## 2021-03-12 ENCOUNTER — Ambulatory Visit (HOSPITAL_COMMUNITY): Payer: 59

## 2021-03-12 ENCOUNTER — Other Ambulatory Visit (HOSPITAL_COMMUNITY): Payer: 59

## 2021-03-14 DIAGNOSIS — Z7682 Awaiting organ transplant status: Secondary | ICD-10-CM | POA: Insufficient documentation

## 2021-03-14 DIAGNOSIS — Z52011 Autologous donor, stem cells: Secondary | ICD-10-CM | POA: Insufficient documentation

## 2021-03-18 ENCOUNTER — Other Ambulatory Visit (HOSPITAL_COMMUNITY): Payer: Self-pay

## 2021-03-18 DIAGNOSIS — C9001 Multiple myeloma in remission: Secondary | ICD-10-CM | POA: Diagnosis not present

## 2021-03-18 MED ORDER — NIVESTYM 480 MCG/0.8ML IJ SOSY
PREFILLED_SYRINGE | INTRAMUSCULAR | 0 refills | Status: DC
  Filled 2021-03-19: qty 11.2, 7d supply, fill #0

## 2021-03-19 ENCOUNTER — Ambulatory Visit (HOSPITAL_COMMUNITY): Payer: 59 | Admitting: Hematology

## 2021-03-19 ENCOUNTER — Ambulatory Visit (HOSPITAL_COMMUNITY): Payer: 59

## 2021-03-19 ENCOUNTER — Other Ambulatory Visit (HOSPITAL_COMMUNITY): Payer: 59

## 2021-03-19 ENCOUNTER — Other Ambulatory Visit (HOSPITAL_COMMUNITY): Payer: Self-pay

## 2021-03-20 ENCOUNTER — Other Ambulatory Visit (HOSPITAL_COMMUNITY): Payer: Self-pay

## 2021-03-20 ENCOUNTER — Other Ambulatory Visit (HOSPITAL_COMMUNITY): Payer: Self-pay | Admitting: Hematology

## 2021-03-20 DIAGNOSIS — C9000 Multiple myeloma not having achieved remission: Secondary | ICD-10-CM

## 2021-03-20 MED ORDER — ACYCLOVIR 400 MG PO TABS
400.0000 mg | ORAL_TABLET | Freq: Two times a day (BID) | ORAL | 5 refills | Status: DC
  Filled 2021-03-20: qty 60, 30d supply, fill #0
  Filled 2021-04-21: qty 60, 30d supply, fill #1
  Filled 2021-05-22: qty 60, 30d supply, fill #2
  Filled 2021-06-23: qty 60, 30d supply, fill #3
  Filled 2021-07-21: qty 60, 30d supply, fill #4
  Filled 2021-08-19: qty 60, 30d supply, fill #5

## 2021-03-21 ENCOUNTER — Other Ambulatory Visit (HOSPITAL_COMMUNITY): Payer: Self-pay

## 2021-03-25 DIAGNOSIS — Z52091 Other blood donor, stem cells: Secondary | ICD-10-CM | POA: Diagnosis not present

## 2021-03-25 DIAGNOSIS — Z52011 Autologous donor, stem cells: Secondary | ICD-10-CM | POA: Diagnosis not present

## 2021-03-25 DIAGNOSIS — C9000 Multiple myeloma not having achieved remission: Secondary | ICD-10-CM | POA: Diagnosis not present

## 2021-03-25 DIAGNOSIS — Z7682 Awaiting organ transplant status: Secondary | ICD-10-CM | POA: Diagnosis not present

## 2021-03-26 DIAGNOSIS — Z7682 Awaiting organ transplant status: Secondary | ICD-10-CM | POA: Diagnosis not present

## 2021-03-26 DIAGNOSIS — C9000 Multiple myeloma not having achieved remission: Secondary | ICD-10-CM | POA: Diagnosis not present

## 2021-03-26 DIAGNOSIS — Z52091 Other blood donor, stem cells: Secondary | ICD-10-CM | POA: Diagnosis not present

## 2021-04-04 ENCOUNTER — Other Ambulatory Visit (HOSPITAL_COMMUNITY): Payer: Self-pay

## 2021-04-04 ENCOUNTER — Other Ambulatory Visit: Payer: Self-pay | Admitting: Cardiology

## 2021-04-04 MED ORDER — METOPROLOL SUCCINATE ER 25 MG PO TB24
25.0000 mg | ORAL_TABLET | Freq: Every day | ORAL | 0 refills | Status: DC
  Filled 2021-04-04: qty 90, 90d supply, fill #0

## 2021-04-07 ENCOUNTER — Other Ambulatory Visit (HOSPITAL_COMMUNITY): Payer: Self-pay

## 2021-04-08 ENCOUNTER — Encounter (HOSPITAL_COMMUNITY): Payer: Self-pay | Admitting: Hematology

## 2021-04-08 ENCOUNTER — Inpatient Hospital Stay (HOSPITAL_COMMUNITY): Payer: 59

## 2021-04-08 ENCOUNTER — Other Ambulatory Visit (HOSPITAL_COMMUNITY): Payer: Self-pay

## 2021-04-08 ENCOUNTER — Other Ambulatory Visit: Payer: Self-pay

## 2021-04-08 ENCOUNTER — Inpatient Hospital Stay (HOSPITAL_COMMUNITY): Payer: 59 | Attending: Hematology | Admitting: Hematology

## 2021-04-08 VITALS — BP 132/90 | HR 97 | Temp 97.4°F | Resp 18 | Ht 69.0 in | Wt 222.3 lb

## 2021-04-08 DIAGNOSIS — E876 Hypokalemia: Secondary | ICD-10-CM | POA: Diagnosis not present

## 2021-04-08 DIAGNOSIS — M898X5 Other specified disorders of bone, thigh: Secondary | ICD-10-CM

## 2021-04-08 DIAGNOSIS — I10 Essential (primary) hypertension: Secondary | ICD-10-CM | POA: Diagnosis not present

## 2021-04-08 DIAGNOSIS — M899 Disorder of bone, unspecified: Secondary | ICD-10-CM | POA: Diagnosis not present

## 2021-04-08 DIAGNOSIS — C9000 Multiple myeloma not having achieved remission: Secondary | ICD-10-CM | POA: Diagnosis not present

## 2021-04-08 LAB — CBC WITH DIFFERENTIAL/PLATELET
Abs Immature Granulocytes: 0.01 10*3/uL (ref 0.00–0.07)
Basophils Absolute: 0 10*3/uL (ref 0.0–0.1)
Basophils Relative: 0 %
Eosinophils Absolute: 0 10*3/uL (ref 0.0–0.5)
Eosinophils Relative: 1 %
HCT: 38.1 % — ABNORMAL LOW (ref 39.0–52.0)
Hemoglobin: 12.4 g/dL — ABNORMAL LOW (ref 13.0–17.0)
Immature Granulocytes: 0 %
Lymphocytes Relative: 40 %
Lymphs Abs: 1.2 10*3/uL (ref 0.7–4.0)
MCH: 32.4 pg (ref 26.0–34.0)
MCHC: 32.5 g/dL (ref 30.0–36.0)
MCV: 99.5 fL (ref 80.0–100.0)
Monocytes Absolute: 0.3 10*3/uL (ref 0.1–1.0)
Monocytes Relative: 11 %
Neutro Abs: 1.3 10*3/uL — ABNORMAL LOW (ref 1.7–7.7)
Neutrophils Relative %: 48 %
Platelets: 227 10*3/uL (ref 150–400)
RBC: 3.83 MIL/uL — ABNORMAL LOW (ref 4.22–5.81)
RDW: 14.2 % (ref 11.5–15.5)
WBC: 2.9 10*3/uL — ABNORMAL LOW (ref 4.0–10.5)
nRBC: 0 % (ref 0.0–0.2)

## 2021-04-08 LAB — COMPREHENSIVE METABOLIC PANEL
ALT: 22 U/L (ref 0–44)
AST: 27 U/L (ref 15–41)
Albumin: 4.1 g/dL (ref 3.5–5.0)
Alkaline Phosphatase: 85 U/L (ref 38–126)
Anion gap: 6 (ref 5–15)
BUN: 18 mg/dL (ref 8–23)
CO2: 25 mmol/L (ref 22–32)
Calcium: 9 mg/dL (ref 8.9–10.3)
Chloride: 107 mmol/L (ref 98–111)
Creatinine, Ser: 0.94 mg/dL (ref 0.61–1.24)
GFR, Estimated: >60 mL/min (ref >60)
Glucose, Bld: 145 mg/dL — ABNORMAL HIGH (ref 70–99)
Potassium: 3.4 mmol/L — ABNORMAL LOW (ref 3.5–5.1)
Sodium: 138 mmol/L (ref 135–145)
Total Bilirubin: 0.8 mg/dL (ref 0.3–1.2)
Total Protein: 6.7 g/dL (ref 6.5–8.1)

## 2021-04-08 LAB — MAGNESIUM: Magnesium: 1.9 mg/dL (ref 1.7–2.4)

## 2021-04-08 LAB — LACTATE DEHYDROGENASE: LDH: 121 U/L (ref 98–192)

## 2021-04-08 MED ORDER — LENALIDOMIDE 10 MG PO CAPS: 10.0000 mg | ORAL_CAPSULE | Freq: Every day | ORAL | 0 refills | Status: DC

## 2021-04-08 NOTE — Progress Notes (Signed)
? ?[facility] ?[address] ?[place], [place] [address] ? ? ?CLINIC:  ?Medical Oncology/Hematology ? ?PCP:  ?[name], MD ?[address] / [place] [address] ?[phone] ? ? ?REASON FOR VISIT:  ?Follow-up for multiple myeloma ? ?PRIOR THERAPY: none ? ?NGS Results: not done ? ?CURRENT THERAPY: DaraVRd every 3 weeks x 6 cycles ? ?BRIEF ONCOLOGIC HISTORY:  ?Oncology History  ?Multiple myeloma without remission ([place])  ?07/09/2020 Pathology Results  ? BONE MARROW, ASPIRATE, CLOT, CORE:  ?-Hypercellular bone marrow with plasma cell neoplasm  ?-See comment  ? ?PERIPHERAL BLOOD:  ?-Slight macrocytic anemia  ? ?COMMENT:  ? ?The bone marrow is hypercellular for age with trilineage hematopoiesis and nonspecific myeloid changes.  In this background, the plasma cells are increased in number representing 10% of all cells associated with atypical cytomorphologic features.  The plasma cells display kappa light chain restriction consistent with plasma cell neoplasm.  Correlation with cytogenetic and FISH studies is recommended.  ?  ?07/24/2020 Initial Diagnosis  ? Multiple myeloma without remission ([place]) ?  ?07/24/2020 Cancer Staging  ? Staging form: Plasma Cell Myeloma and Plasma Cell Disorders, AJCC 8th Edition ?- Clinical stage from 07/24/2020: RISS Stage I (Beta-2-microglobulin (mg/L): 2.1, Albumin (g/dL): 3.7, ISS: Stage I, High-risk cytogenetics: Absent, LDH: Normal) - Signed by Heath Lark, MD on 07/24/2020 ?Stage prefix: Initial diagnosis ?Beta 2 microglobulin range (mg/L): Less than 3.5 ?Albumin range (g/dL): Greater than or equal to 3.5 ?Cytogenetics: No abnormalities ? ?  ?09/03/2020 -  Chemotherapy  ? Patient is on Treatment Plan : MYELOMA NEWLY DIAGNOSED TRANSPLANT CANDIDATE DaraVRd (Daratumumab SQ) q21d x 6 Cycles (Induction/Consolidation)  ?   ? ? ?CANCER STAGING: ? Cancer Staging  ?Multiple myeloma without remission ([place]) ?Staging form: Plasma Cell Myeloma and Plasma Cell Disorders, AJCC 8th  Edition ?- Clinical stage from 07/24/2020: RISS Stage I (Beta-2-microglobulin (mg/L): 2.1, Albumin (g/dL): 3.7, ISS: Stage I, High-risk cytogenetics: Absent, LDH: Normal) - Signed by Heath Lark, MD on 07/24/2020 ? ? ?INTERVAL HISTORY:  ?Eugene [name], a 66 y.o. male, returns for routine follow-up of his DaraVRd. Kemontae was last seen on 02/12/2021.  ? ?Today he reports feeling good. He reports pain in his left knee starting 2 weeks ago while he was working; he denies injury to that site, and he denies pain in his right thigh. He reports popping in his left knee when moving, and the pain is worsened when laying down or walking. He denies nausea and vomiting. He reports continued fatigue.  ? ?REVIEW OF SYSTEMS:  ?Review of Systems  ?Constitutional:  Positive for fatigue. Negative for appetite change.  ?Gastrointestinal:  Negative for nausea and vomiting.  ?Musculoskeletal:  Positive for arthralgias (7/10 L knee).  ?Psychiatric/Behavioral:  Positive for sleep disturbance.   ?All other systems reviewed and are negative. ? ?PAST MEDICAL/SURGICAL HISTORY:  ?Past Medical History:  ?Diagnosis Date  ? Back pain   ? CAD (coronary artery disease)   ? a. 01/2017 NSTEMI/Cath: LM nl, LAD 25p - ? hypodense but no filling defect, RI nl, LCX nl, RCA nl, EF 55-65%-->Med Rx.  ? Erectile dysfunction   ? History of echocardiogram   ? a. 01/2017 Echo: EF 55-60%, no rwma.  ? Hypertension   ? ?Past Surgical History:  ?Procedure Laterality Date  ? LEFT HEART CATH AND CORONARY ANGIOGRAPHY N/A 01/15/2017  ? Procedure: LEFT HEART CATH AND CORONARY ANGIOGRAPHY;  Surgeon: Martinique, Peter M, MD;  Location: [facility];  Service: Cardiovascular;  Laterality: N/A;  ? ORIF trocanteric femoral IM Nail Right   ? ? ?SOCIAL HISTORY:  ?Social History  ? ?Socioeconomic History  ? Marital status: Significant Other  ?  Spouse name: Not on file  ? Number of children: Not on file  ? Years of education: Not on file  ? Highest education level: Not on file   ?Occupational History  ? Not on file  ?Tobacco Use  ? Smoking status: Never  ? Smokeless tobacco: Never  ?Vaping Use  ? Vaping Use: Never used  ?Substance and Sexual Activity  ? Alcohol use: No  ? Drug use: No  ? Sexual activity: Yes  ?Other Topics Concern  ? Not on file  ?Social History Narrative  ? Not on file  ? ?Social Determinants of Health  ? ?Financial Resource Strain: Low Risk   ? Difficulty of Paying Living Expenses: Not hard at all  ?Food Insecurity: No Food Insecurity  ? Worried About Charity fundraiser in the Last Year: Never true  ? Ran Out of Food in the Last Year: Never true  ?Transportation Needs: No Transportation Needs  ? Lack of Transportation (Medical): No  ? Lack of Transportation (Non-Medical): No  ?Physical Activity: Insufficiently Active  ? Days of Exercise per Week: 2 days  ? Minutes of Exercise per Session: 10 min  ?Stress: No Stress Concern Present  ? Feeling of Stress : Not at all  ?Social Connections: Moderately Integrated  ? Frequency of Communication with Friends and Family: More than three times a week  ? Frequency of Social Gatherings with Friends and Family: More than three times a week  ? Attends Religious Services: 1 to 4 times per year  ? Active Member of Clubs or Organizations: No  ? Attends Archivist Meetings: 1 to 4 times per year  ? Marital Status: Divorced  ?Intimate Partner Violence: Not At Risk  ? Fear of Current or Ex-Partner: No  ? Emotionally Abused: No  ? Physically Abused: No  ? Sexually Abused: No  ? ? ?FAMILY HISTORY:  ?Family History  ?Problem Relation Age of Onset  ? Cancer Mother   ?     breast  ? Diabetes Mother   ? Diabetes Sister   ? Hypertension Sister   ? Cancer Brother   ? Mental illness Sister   ? ? ?CURRENT MEDICATIONS:  ?Current Outpatient Medications  ?Medication Sig Dispense Refill  ? metoprolol succinate (TOPROL-XL) 25 MG 24 hr tablet Take 1 tablet (25 mg total) by mouth daily. 90 tablet 0  ? Multiple Vitamin (MULTIVITAMIN) tablet Take 1  tablet by mouth daily.      ? traMADol (ULTRAM) 50 MG tablet Take 1 tablet (50 mg total) by mouth every 6 (six) hours as needed. 60 tablet 0  ? acyclovir (ZOVIRAX) 400 MG tablet Take 1 tablet (400 mg total) by mouth 2 (two) times daily. 60 tablet 5  ? aspirin EC 81 MG tablet Take 1 tablet (81 mg total) by mouth daily with breakfast. (Patient not taking: Reported on 04/08/2021) 30 tablet 5  ? calcium-vitamin D (OSCAL WITH D) 500-200 MG-UNIT tablet Take 1 tablet by mouth.    ? dexamethasone (DECADRON) 4 MG tablet Take 10 tablets (40 mg total) by mouth once a week. (Patient not taking: Reported on 04/08/2021) 40 tablet 6  ? filgrastim-aafi (NIVESTYM) 480 MCG/0.8ML SOSY injection Inject 2 - 480 mcg syringes under the skin daily for 7 days to equal a total daily dose of 960  mcg (Patient not taking: Reported on 04/08/2021) 11.2 mL 0  ? nitroGLYCERIN (NITROSTAT) 0.4 MG SL tablet Place 1 tablet (0.4 mg total) under the tongue every 5 (five) minutes for 3 doses as needed for chest pain. (Patient not taking: Reported on 04/08/2021) 25 tablet 3  ? rosuvastatin (CRESTOR) 40 MG tablet Take 1 tablet (40 mg total) by mouth every other day. (Patient not taking: Reported on 04/08/2021) 90 tablet 1  ? ?No current facility-administered medications for this visit.  ? ? ?ALLERGIES:  ?No Known Allergies ? ?PHYSICAL EXAM:  ?Performance status (ECOG): 1 - Symptomatic but completely ambulatory ? ?Vitals:  ? 04/08/21 0915  ?BP: 132/90  ?Pulse: 97  ?Resp: 18  ?Temp: (!) 97.4 ?F (36.3 ?C)  ?SpO2: 100%  ? ?Wt Readings from Last 3 Encounters:  ?04/08/21 222 lb 4.8 oz (100.8 kg)  ?02/26/21 231 lb (104.8 kg)  ?02/19/21 228 lb 12.8 oz (103.8 kg)  ? ?Physical Exam ?Vitals reviewed.  ?Constitutional:   ?   Appearance: Normal appearance. He is obese.  ?Cardiovascular:  ?   Rate and Rhythm: Normal rate and regular rhythm.  ?   Pulses: Normal pulses.  ?   Heart sounds: Normal heart sounds.  ?Pulmonary:  ?   Effort: Pulmonary effort is normal.  ?   Breath  sounds: Normal breath sounds.  ?Neurological:  ?   General: No focal deficit present.  ?   Mental Status: He is alert and oriented to person, place, and time.  ?Psychiatric:     ?   Mood and Affect: Mood norma

## 2021-04-08 NOTE — Patient Instructions (Signed)
[facility] at [facility] ?Discharge Instructions ? ? ?You were seen and examined today by Dr. Delton Coombes. ? ?He reviewed the results of your lab work which are normal/stable. Your multiple myeloma lab results are pending. ? ?We will restart you on treatment with Darzalex injection every 4 weeks and Revlimid 10 mg daily, 3 weeks on and 1 week off. We will send new prescription for the Revlimid. ? ?Continue taking aspirin 81 mg daily.  You may use over the counter aspirin.  ? ?Return as scheduled.  ? ? ?Thank you for choosing [facility] at [facility] to provide your oncology and hematology care.  To afford each patient quality time with our provider, please arrive at least 15 minutes before your scheduled appointment time.  ? ?If you have a lab appointment with the [facility] please come in thru the Main Entrance and check in at the main information desk. ? ?You need to re-schedule your appointment should you arrive 10 or more minutes late.  We strive to give you quality time with our providers, and arriving late affects you and other patients whose appointments are after yours.  Also, if you no show three or more times for appointments you may be dismissed from the clinic at the providers discretion.     ?Again, thank you for choosing [facility].  Our hope is that these requests will decrease the amount of time that you wait before being seen by our physicians.       ?_____________________________________________________________ ? ?Should you have questions after your visit to [facility], please contact our office at [phone] and follow the prompts.  Our office hours are 8:00 a.m. and 4:30 p.m. Monday - Friday.  Please note that voicemails left after 4:00 p.m. may not be returned until the following business day.  We are closed weekends and major holidays.  You do have access to a nurse 24-7, just call the main number to the clinic  [phone] and do not press any options, hold on the line and a nurse will answer the phone.   ? ?For prescription refill requests, have your pharmacy contact our office and allow 72 hours.   ? ?Due to Covid, you will need to wear a mask upon entering the hospital. If you do not have a mask, a mask will be given to you at the Main Entrance upon arrival. For doctor visits, patients may have 1 support person age 63 or older with them. For treatment visits, patients can not have anyone with them due to social distancing guidelines and our immunocompromised population.  ? ?   ?

## 2021-04-08 NOTE — Telephone Encounter (Signed)
Revlimid prescription sent per Dr. Delton Coombes. ?

## 2021-04-09 LAB — KAPPA/LAMBDA LIGHT CHAINS
Kappa free light chain: 10.9 mg/L (ref 3.3–19.4)
Kappa, lambda light chain ratio: 2.48 — ABNORMAL HIGH (ref 0.26–1.65)
Lambda free light chains: 4.4 mg/L — ABNORMAL LOW (ref 5.7–26.3)

## 2021-04-09 LAB — PROTEIN ELECTROPHORESIS, SERUM
A/G Ratio: 1.5 (ref 0.7–1.7)
Albumin ELP: 3.7 g/dL (ref 2.9–4.4)
Alpha-1-Globulin: 0.2 g/dL (ref 0.0–0.4)
Alpha-2-Globulin: 0.6 g/dL (ref 0.4–1.0)
Beta Globulin: 0.9 g/dL (ref 0.7–1.3)
Gamma Globulin: 0.6 g/dL (ref 0.4–1.8)
Globulin, Total: 2.4 g/dL (ref 2.2–3.9)
Total Protein ELP: 6.1 g/dL (ref 6.0–8.5)

## 2021-04-15 ENCOUNTER — Inpatient Hospital Stay (HOSPITAL_COMMUNITY): Payer: 59

## 2021-04-15 DIAGNOSIS — C9000 Multiple myeloma not having achieved remission: Secondary | ICD-10-CM | POA: Diagnosis not present

## 2021-04-15 DIAGNOSIS — M7652 Patellar tendinitis, left knee: Secondary | ICD-10-CM | POA: Diagnosis not present

## 2021-04-15 DIAGNOSIS — M899 Disorder of bone, unspecified: Secondary | ICD-10-CM | POA: Diagnosis not present

## 2021-04-15 DIAGNOSIS — M1611 Unilateral primary osteoarthritis, right hip: Secondary | ICD-10-CM | POA: Diagnosis not present

## 2021-04-15 DIAGNOSIS — M47816 Spondylosis without myelopathy or radiculopathy, lumbar region: Secondary | ICD-10-CM | POA: Diagnosis not present

## 2021-04-15 DIAGNOSIS — M461 Sacroiliitis, not elsewhere classified: Secondary | ICD-10-CM | POA: Diagnosis not present

## 2021-04-16 ENCOUNTER — Inpatient Hospital Stay (HOSPITAL_COMMUNITY): Payer: 59

## 2021-04-16 ENCOUNTER — Inpatient Hospital Stay (HOSPITAL_COMMUNITY): Payer: 59 | Attending: Hematology

## 2021-04-16 VITALS — BP 116/77 | HR 81 | Temp 97.5°F | Resp 18

## 2021-04-16 DIAGNOSIS — Z5112 Encounter for antineoplastic immunotherapy: Secondary | ICD-10-CM | POA: Insufficient documentation

## 2021-04-16 DIAGNOSIS — C9000 Multiple myeloma not having achieved remission: Secondary | ICD-10-CM

## 2021-04-16 LAB — CBC WITH DIFFERENTIAL/PLATELET
Abs Immature Granulocytes: 0 10*3/uL (ref 0.00–0.07)
Basophils Absolute: 0 10*3/uL (ref 0.0–0.1)
Basophils Relative: 0 %
Eosinophils Absolute: 0.1 10*3/uL (ref 0.0–0.5)
Eosinophils Relative: 1 %
HCT: 36.9 % — ABNORMAL LOW (ref 39.0–52.0)
Hemoglobin: 12 g/dL — ABNORMAL LOW (ref 13.0–17.0)
Immature Granulocytes: 0 %
Lymphocytes Relative: 38 %
Lymphs Abs: 1.3 10*3/uL (ref 0.7–4.0)
MCH: 32.4 pg (ref 26.0–34.0)
MCHC: 32.5 g/dL (ref 30.0–36.0)
MCV: 99.7 fL (ref 80.0–100.0)
Monocytes Absolute: 0.4 10*3/uL (ref 0.1–1.0)
Monocytes Relative: 12 %
Neutro Abs: 1.7 10*3/uL (ref 1.7–7.7)
Neutrophils Relative %: 49 %
Platelets: 168 10*3/uL (ref 150–400)
RBC: 3.7 MIL/uL — ABNORMAL LOW (ref 4.22–5.81)
RDW: 14.8 % (ref 11.5–15.5)
WBC: 3.5 10*3/uL — ABNORMAL LOW (ref 4.0–10.5)
nRBC: 0 % (ref 0.0–0.2)

## 2021-04-16 LAB — COMPREHENSIVE METABOLIC PANEL
ALT: 21 U/L (ref 0–44)
AST: 29 U/L (ref 15–41)
Albumin: 4 g/dL (ref 3.5–5.0)
Alkaline Phosphatase: 83 U/L (ref 38–126)
Anion gap: 8 (ref 5–15)
BUN: 18 mg/dL (ref 8–23)
CO2: 25 mmol/L (ref 22–32)
Calcium: 9.2 mg/dL (ref 8.9–10.3)
Chloride: 105 mmol/L (ref 98–111)
Creatinine, Ser: 1.03 mg/dL (ref 0.61–1.24)
GFR, Estimated: >60 mL/min (ref >60)
Glucose, Bld: 134 mg/dL — ABNORMAL HIGH (ref 70–99)
Potassium: 3.8 mmol/L (ref 3.5–5.1)
Sodium: 138 mmol/L (ref 135–145)
Total Bilirubin: 0.6 mg/dL (ref 0.3–1.2)
Total Protein: 6.8 g/dL (ref 6.5–8.1)

## 2021-04-16 LAB — LACTATE DEHYDROGENASE: LDH: 128 U/L (ref 98–192)

## 2021-04-16 LAB — MAGNESIUM: Magnesium: 1.9 mg/dL (ref 1.7–2.4)

## 2021-04-16 MED ORDER — DARATUMUMAB-HYALURONIDASE-FIHJ 1800-30000 MG-UT/15ML ~~LOC~~ SOLN
1800.0000 mg | Freq: Once | SUBCUTANEOUS | Status: AC
  Administered 2021-04-16: 1800 mg via SUBCUTANEOUS
  Filled 2021-04-16: qty 15

## 2021-04-16 MED ORDER — DEXAMETHASONE 4 MG PO TABS
20.0000 mg | ORAL_TABLET | Freq: Once | ORAL | Status: AC
  Administered 2021-04-16: 20 mg via ORAL
  Filled 2021-04-16: qty 5

## 2021-04-16 MED ORDER — ACETAMINOPHEN 325 MG PO TABS
650.0000 mg | ORAL_TABLET | Freq: Once | ORAL | Status: AC
  Administered 2021-04-16: 650 mg via ORAL
  Filled 2021-04-16: qty 2

## 2021-04-16 MED ORDER — DIPHENHYDRAMINE HCL 25 MG PO CAPS
25.0000 mg | ORAL_CAPSULE | Freq: Once | ORAL | Status: AC
  Administered 2021-04-16: 25 mg via ORAL
  Filled 2021-04-16: qty 1

## 2021-04-16 NOTE — Patient Instructions (Signed)
[facility]  Discharge Instructions: ?Thank you for choosing [facility] to provide your oncology and hematology care.  ?If you have a lab appointment with the [facility], please come in thru the Main Entrance and check in at the main information desk. ? ?Wear comfortable clothing and clothing appropriate for easy access to any Portacath or PICC line.  ? ?We strive to give you quality time with your provider. You may need to reschedule your appointment if you arrive late (15 or more minutes).  Arriving late affects you and other patients whose appointments are after yours.  Also, if you miss three or more appointments without notifying the office, you may be dismissed from the clinic at the provider?s discretion.    ?  ?For prescription refill requests, have your pharmacy contact our office and allow 72 hours for refills to be completed.   ? ?Today you received the following chemotherapy and/or immunotherapy agents Daratumumab    ?  ?To help prevent nausea and vomiting after your treatment, we encourage you to take your nausea medication as directed. ? ?BELOW ARE SYMPTOMS THAT SHOULD BE REPORTED IMMEDIATELY: ?*FEVER GREATER THAN 100.4 F (38 ?C) OR HIGHER ?*CHILLS OR SWEATING ?*NAUSEA AND VOMITING THAT IS NOT CONTROLLED WITH YOUR NAUSEA MEDICATION ?*UNUSUAL SHORTNESS OF BREATH ?*UNUSUAL BRUISING OR BLEEDING ?*URINARY PROBLEMS (pain or burning when urinating, or frequent urination) ?*BOWEL PROBLEMS (unusual diarrhea, constipation, pain near the anus) ?TENDERNESS IN MOUTH AND THROAT WITH OR WITHOUT PRESENCE OF ULCERS (sore throat, sores in mouth, or a toothache) ?UNUSUAL RASH, SWELLING OR PAIN  ?UNUSUAL VAGINAL DISCHARGE OR ITCHING  ? ?Items with * indicate a potential emergency and should be followed up as soon as possible or go to the Emergency Department if any problems should occur. ? ?Please show the CHEMOTHERAPY ALERT CARD or IMMUNOTHERAPY ALERT CARD at check-in to the Emergency  Department and triage nurse. ? ?Should you have questions after your visit or need to cancel or reschedule your appointment, please contact [facility] [phone]  and follow the prompts.  Office hours are 8:00 a.m. to 4:30 p.m. Monday - Friday. Please note that voicemails left after 4:00 p.m. may not be returned until the following business day.  We are closed weekends and major holidays. You have access to a nurse at all times for urgent questions. Please call the main number to the clinic [phone] and follow the prompts. ? ?For any non-urgent questions, you may also contact your provider using MyChart. We now offer e-Visits for anyone 59 and older to request care online for non-urgent symptoms. For details visit [email or web address]. ?  ?Also download the MyChart app! Go to the app store, search "MyChart", open the app, select [facility], and log in with your MyChart username and password. ? ?Due to Covid, a mask is required upon entering the hospital/clinic. If you do not have a mask, one will be given to you upon arrival. For doctor visits, patients may have 1 support person aged 80 or older with them. For treatment visits, patients cannot have anyone with them due to current Covid guidelines and our immunocompromised population.  ?

## 2021-04-16 NOTE — Progress Notes (Signed)
Patient presents today for Daratumumab injection per providers order.  Vital signs within parameters for treatment.  Labs pending.  Patient has no new complaints at this time. ? ?Labs within parameters for treatment. ? ?Daratumumab administration without incident; injection site WNL; see MAR for injection details.  Patient tolerated procedure well and without incident.  No questions or complaints noted at this time.  ?

## 2021-04-17 ENCOUNTER — Other Ambulatory Visit (HOSPITAL_COMMUNITY): Payer: Self-pay

## 2021-04-17 LAB — KAPPA/LAMBDA LIGHT CHAINS
Kappa free light chain: 11.7 mg/L (ref 3.3–19.4)
Kappa, lambda light chain ratio: 2.49 — ABNORMAL HIGH (ref 0.26–1.65)
Lambda free light chains: 4.7 mg/L — ABNORMAL LOW (ref 5.7–26.3)

## 2021-04-18 LAB — PROTEIN ELECTROPHORESIS, SERUM
A/G Ratio: 1.5 (ref 0.7–1.7)
Albumin ELP: 3.5 g/dL (ref 2.9–4.4)
Alpha-1-Globulin: 0.3 g/dL (ref 0.0–0.4)
Alpha-2-Globulin: 0.6 g/dL (ref 0.4–1.0)
Beta Globulin: 0.9 g/dL (ref 0.7–1.3)
Gamma Globulin: 0.7 g/dL (ref 0.4–1.8)
Globulin, Total: 2.4 g/dL (ref 2.2–3.9)
Total Protein ELP: 5.9 g/dL — ABNORMAL LOW (ref 6.0–8.5)

## 2021-04-21 ENCOUNTER — Other Ambulatory Visit (HOSPITAL_COMMUNITY): Payer: Self-pay

## 2021-05-02 ENCOUNTER — Other Ambulatory Visit (HOSPITAL_COMMUNITY): Payer: Self-pay | Admitting: Hematology

## 2021-05-06 ENCOUNTER — Other Ambulatory Visit (HOSPITAL_COMMUNITY): Payer: Self-pay

## 2021-05-06 MED ORDER — LENALIDOMIDE 10 MG PO CAPS: ORAL_CAPSULE | ORAL | 0 refills | Status: DC

## 2021-05-06 NOTE — Telephone Encounter (Signed)
Chart reviewed. Revlimid refilled per last office note with Dr. Katragadda.  

## 2021-05-13 ENCOUNTER — Inpatient Hospital Stay (HOSPITAL_COMMUNITY): Payer: 59 | Attending: Hematology | Admitting: Hematology

## 2021-05-13 ENCOUNTER — Inpatient Hospital Stay (HOSPITAL_COMMUNITY): Payer: 59

## 2021-05-13 VITALS — BP 123/75 | HR 76 | Temp 96.0°F | Resp 20 | Ht 70.28 in | Wt 224.1 lb

## 2021-05-13 DIAGNOSIS — C9000 Multiple myeloma not having achieved remission: Secondary | ICD-10-CM

## 2021-05-13 DIAGNOSIS — R7303 Prediabetes: Secondary | ICD-10-CM | POA: Insufficient documentation

## 2021-05-13 DIAGNOSIS — E782 Mixed hyperlipidemia: Secondary | ICD-10-CM | POA: Insufficient documentation

## 2021-05-13 DIAGNOSIS — Z5112 Encounter for antineoplastic immunotherapy: Secondary | ICD-10-CM | POA: Insufficient documentation

## 2021-05-13 LAB — COMPREHENSIVE METABOLIC PANEL
ALT: 23 U/L (ref 0–44)
AST: 30 U/L (ref 15–41)
Albumin: 4.1 g/dL (ref 3.5–5.0)
Alkaline Phosphatase: 64 U/L (ref 38–126)
Anion gap: 7 (ref 5–15)
BUN: 18 mg/dL (ref 8–23)
CO2: 27 mmol/L (ref 22–32)
Calcium: 9 mg/dL (ref 8.9–10.3)
Chloride: 105 mmol/L (ref 98–111)
Creatinine, Ser: 1 mg/dL (ref 0.61–1.24)
GFR, Estimated: >60 mL/min (ref >60)
Glucose, Bld: 126 mg/dL — ABNORMAL HIGH (ref 70–99)
Potassium: 3.6 mmol/L (ref 3.5–5.1)
Sodium: 139 mmol/L (ref 135–145)
Total Bilirubin: 0.7 mg/dL (ref 0.3–1.2)
Total Protein: 6.8 g/dL (ref 6.5–8.1)

## 2021-05-13 LAB — CBC WITH DIFFERENTIAL/PLATELET
Abs Immature Granulocytes: 0.01 10*3/uL (ref 0.00–0.07)
Basophils Absolute: 0 10*3/uL (ref 0.0–0.1)
Basophils Relative: 1 %
Eosinophils Absolute: 0 10*3/uL (ref 0.0–0.5)
Eosinophils Relative: 1 %
HCT: 39.4 % (ref 39.0–52.0)
Hemoglobin: 12.9 g/dL — ABNORMAL LOW (ref 13.0–17.0)
Immature Granulocytes: 0 %
Lymphocytes Relative: 40 %
Lymphs Abs: 1.6 10*3/uL (ref 0.7–4.0)
MCH: 32.2 pg (ref 26.0–34.0)
MCHC: 32.7 g/dL (ref 30.0–36.0)
MCV: 98.3 fL (ref 80.0–100.0)
Monocytes Absolute: 0.6 10*3/uL (ref 0.1–1.0)
Monocytes Relative: 16 %
Neutro Abs: 1.7 10*3/uL (ref 1.7–7.7)
Neutrophils Relative %: 42 %
Platelets: 248 10*3/uL (ref 150–400)
RBC: 4.01 MIL/uL — ABNORMAL LOW (ref 4.22–5.81)
RDW: 13.5 % (ref 11.5–15.5)
WBC: 4 10*3/uL (ref 4.0–10.5)
nRBC: 0 % (ref 0.0–0.2)

## 2021-05-13 LAB — LACTATE DEHYDROGENASE: LDH: 132 U/L (ref 98–192)

## 2021-05-13 LAB — MAGNESIUM: Magnesium: 1.9 mg/dL (ref 1.7–2.4)

## 2021-05-13 MED ORDER — DARATUMUMAB-HYALURONIDASE-FIHJ 1800-30000 MG-UT/15ML ~~LOC~~ SOLN
1800.0000 mg | Freq: Once | SUBCUTANEOUS | Status: AC
  Administered 2021-05-13: 1800 mg via SUBCUTANEOUS
  Filled 2021-05-13: qty 15

## 2021-05-13 MED ORDER — DIPHENHYDRAMINE HCL 25 MG PO CAPS
25.0000 mg | ORAL_CAPSULE | Freq: Once | ORAL | Status: AC
  Administered 2021-05-13: 25 mg via ORAL
  Filled 2021-05-13: qty 1

## 2021-05-13 MED ORDER — DEXAMETHASONE 4 MG PO TABS
20.0000 mg | ORAL_TABLET | Freq: Once | ORAL | Status: AC
  Administered 2021-05-13: 20 mg via ORAL

## 2021-05-13 MED ORDER — ACETAMINOPHEN 325 MG PO TABS
650.0000 mg | ORAL_TABLET | Freq: Once | ORAL | Status: AC
  Administered 2021-05-13: 650 mg via ORAL
  Filled 2021-05-13: qty 2

## 2021-05-13 NOTE — Progress Notes (Signed)
Patient is taking Revlimid as prescribed.  He has  missed only one dose and reports no side effects at this time.  ? ?

## 2021-05-13 NOTE — Patient Instructions (Signed)
[facility]  Discharge Instructions: ?Thank you for choosing [facility] to provide your oncology and hematology care.  ?If you have a lab appointment with the [facility], please come in thru the Main Entrance and check in at the main information desk. ? ?Wear comfortable clothing and clothing appropriate for easy access to any Portacath or PICC line.  ? ?We strive to give you quality time with your provider. You may need to reschedule your appointment if you arrive late (15 or more minutes).  Arriving late affects you and other patients whose appointments are after yours.  Also, if you miss three or more appointments without notifying the office, you may be dismissed from the clinic at the provider?s discretion.    ?  ?For prescription refill requests, have your pharmacy contact our office and allow 72 hours for refills to be completed.   ? ?Today you received the following chemotherapy and/or immunotherapy agents Daratumumab     ?  ?To help prevent nausea and vomiting after your treatment, we encourage you to take your nausea medication as directed. ? ?BELOW ARE SYMPTOMS THAT SHOULD BE REPORTED IMMEDIATELY: ?*FEVER GREATER THAN 100.4 F (38 ?C) OR HIGHER ?*CHILLS OR SWEATING ?*NAUSEA AND VOMITING THAT IS NOT CONTROLLED WITH YOUR NAUSEA MEDICATION ?*UNUSUAL SHORTNESS OF BREATH ?*UNUSUAL BRUISING OR BLEEDING ?*URINARY PROBLEMS (pain or burning when urinating, or frequent urination) ?*BOWEL PROBLEMS (unusual diarrhea, constipation, pain near the anus) ?TENDERNESS IN MOUTH AND THROAT WITH OR WITHOUT PRESENCE OF ULCERS (sore throat, sores in mouth, or a toothache) ?UNUSUAL RASH, SWELLING OR PAIN  ?UNUSUAL VAGINAL DISCHARGE OR ITCHING  ? ?Items with * indicate a potential emergency and should be followed up as soon as possible or go to the Emergency Department if any problems should occur. ? ?Please show the CHEMOTHERAPY ALERT CARD or IMMUNOTHERAPY ALERT CARD at check-in to the Emergency  Department and triage nurse. ? ?Should you have questions after your visit or need to cancel or reschedule your appointment, please contact [facility] [phone]  and follow the prompts.  Office hours are 8:00 a.m. to 4:30 p.m. Monday - Friday. Please note that voicemails left after 4:00 p.m. may not be returned until the following business day.  We are closed weekends and major holidays. You have access to a nurse at all times for urgent questions. Please call the main number to the clinic [phone] and follow the prompts. ? ?For any non-urgent questions, you may also contact your provider using MyChart. We now offer e-Visits for anyone 62 and older to request care online for non-urgent symptoms. For details visit [email or web address]. ?  ?Also download the MyChart app! Go to the app store, search "MyChart", open the app, select [facility], and log in with your MyChart username and password. ? ?Due to Covid, a mask is required upon entering the hospital/clinic. If you do not have a mask, one will be given to you upon arrival. For doctor visits, patients may have 1 support person aged 62 or older with them. For treatment visits, patients cannot have anyone with them due to current Covid guidelines and our immunocompromised population.  ?

## 2021-05-13 NOTE — Progress Notes (Signed)
Eugene [name] presents today for Daratumumab injection per the provider's orders.  Stable during administration without incident; injection site WNL; see MAR for injection details.  Patient tolerated procedure well and without incident.  No questions or complaints noted at this time. Discharge from clinic ambulatory in stable condition.  Alert and oriented X 3.  Follow up with [facility] as scheduled.  ?

## 2021-05-13 NOTE — Progress Notes (Signed)
Patient has been examined by Dr. Katragadda, and vital signs and labs have been reviewed. ANC, Creatinine, LFTs, hemoglobin, and platelets are within treatment parameters per M.D. - pt may proceed with treatment.    °

## 2021-05-13 NOTE — Progress Notes (Signed)
? ?[name] ?[address] ?[name], [place] [address] ? ? ?CLINIC:  ?Medical Oncology/Hematology ? ?PCP:  ?[name], MD ?[address] / [place] [address] ?[phone] ? ? ?REASON FOR VISIT:  ?Follow-up for multiple myeloma ? ?PRIOR THERAPY: none ? ?NGS Results: not done ? ?CURRENT THERAPY: DaraVRd every 3 weeks x 6 cycles ? ?BRIEF ONCOLOGIC HISTORY:  ?Oncology History  ?Multiple myeloma without remission ([place])  ?07/09/2020 Pathology Results  ? BONE MARROW, ASPIRATE, CLOT, CORE:  ?-Hypercellular bone marrow with plasma cell neoplasm  ?-See comment  ? ?PERIPHERAL BLOOD:  ?-Slight macrocytic anemia  ? ?COMMENT:  ? ?The bone marrow is hypercellular for age with trilineage hematopoiesis and nonspecific myeloid changes.  In this background, the plasma cells are increased in number representing 10% of all cells associated with atypical cytomorphologic features.  The plasma cells display kappa light chain restriction consistent with plasma cell neoplasm.  Correlation with cytogenetic and FISH studies is recommended.  ?  ?07/24/2020 Initial Diagnosis  ? Multiple myeloma without remission ([place]) ? ?  ?07/24/2020 Cancer Staging  ? Staging form: Plasma Cell Myeloma and Plasma Cell Disorders, AJCC 8th Edition ?- Clinical stage from 07/24/2020: RISS Stage I (Beta-2-microglobulin (mg/L): 2.1, Albumin (g/dL): 3.7, ISS: Stage I, High-risk cytogenetics: Absent, LDH: Normal) - Signed by Heath Lark, MD on 07/24/2020 ?Stage prefix: Initial diagnosis ?Beta 2 microglobulin range (mg/L): Less than 3.5 ?Albumin range (g/dL): Greater than or equal to 3.5 ?Cytogenetics: No abnormalities ? ?  ?09/03/2020 -  Chemotherapy  ? Patient is on Treatment Plan : MYELOMA NEWLY DIAGNOSED TRANSPLANT CANDIDATE DaraVRd (Daratumumab SQ) q21d x 6 Cycles (Induction/Consolidation)  ? ?  ?  ? ? ?CANCER STAGING: ? Cancer Staging  ?Multiple myeloma without remission ([place]) ?Staging form: Plasma Cell Myeloma and Plasma Cell Disorders, AJCC 8th  Edition ?- Clinical stage from 07/24/2020: RISS Stage I (Beta-2-microglobulin (mg/L): 2.1, Albumin (g/dL): 3.7, ISS: Stage I, High-risk cytogenetics: Absent, LDH: Normal) - Signed by Heath Lark, MD on 07/24/2020 ? ? ?INTERVAL HISTORY:  ?[name], a 66 y.o. male, returns for routine follow-up and consideration for next cycle of chemotherapy. [name] was last seen on 04/08/2021. ? ?Due for cycle #11 of Darzalex Faspro today.  ? ?Overall, he tells me he has been feeling pretty well. His constipation is controlled with stool softener and prune juice. He denies current pain in his left knee. He reports burning in his feet when walking on concrete which has resolved since he started wearing shoe sole inserts.  ? ?Overall, he feels ready for next cycle of chemo today.  ? ?REVIEW OF SYSTEMS:  ?Review of Systems  ?Constitutional:  Negative for appetite change and fatigue.  ?Gastrointestinal:  Positive for constipation (controlled).  ?Musculoskeletal:  Positive for arthralgias (3/10 R foot).  ?All other systems reviewed and are negative. ? ?PAST MEDICAL/SURGICAL HISTORY:  ?Past Medical History:  ?Diagnosis Date  ? Back pain   ? CAD (coronary artery disease)   ? a. 01/2017 NSTEMI/Cath: LM nl, LAD 25p - ? hypodense but no filling defect, RI nl, LCX nl, RCA nl, EF 55-65%-->Med Rx.  ? Erectile dysfunction   ? History of echocardiogram   ? a. 01/2017 Echo: EF 55-60%, no rwma.  ? Hypertension   ? ?Past Surgical History:  ?Procedure Laterality Date  ? LEFT HEART CATH AND CORONARY ANGIOGRAPHY N/A 01/15/2017  ? Procedure: LEFT HEART CATH AND CORONARY ANGIOGRAPHY;  Surgeon: Martinique, Peter M, MD;  Location: [facility];  Service: Cardiovascular;  Laterality: N/A;  ? ORIF trocanteric femoral IM Nail Right   ? ? ?SOCIAL HISTORY:  ?Social History  ? ?Socioeconomic History  ? Marital status: Significant Other  ?  Spouse name: Not on file  ? Number of children: Not on file  ? Years of education: Not on file  ? Highest education  level: Not on file  ?Occupational History  ? Not on file  ?Tobacco Use  ? Smoking status: Never  ? Smokeless tobacco: Never  ?Vaping Use  ? Vaping Use: Never used  ?Substance and Sexual Activity  ? Alcohol use: No  ? Drug use: No  ? Sexual activity: Yes  ?Other Topics Concern  ? Not on file  ?Social History Narrative  ? Not on file  ? ?Social Determinants of Health  ? ?Financial Resource Strain: Low Risk   ? Difficulty of Paying Living Expenses: Not hard at all  ?Food Insecurity: No Food Insecurity  ? Worried About Charity fundraiser in the Last Year: Never true  ? Ran Out of Food in the Last Year: Never true  ?Transportation Needs: No Transportation Needs  ? Lack of Transportation (Medical): No  ? Lack of Transportation (Non-Medical): No  ?Physical Activity: Insufficiently Active  ? Days of Exercise per Week: 2 days  ? Minutes of Exercise per Session: 10 min  ?Stress: No Stress Concern Present  ? Feeling of Stress : Not at all  ?Social Connections: Moderately Integrated  ? Frequency of Communication with Friends and Family: More than three times a week  ? Frequency of Social Gatherings with Friends and Family: More than three times a week  ? Attends Religious Services: 1 to 4 times per year  ? Active Member of Clubs or Organizations: No  ? Attends Archivist Meetings: 1 to 4 times per year  ? Marital Status: Divorced  ?Intimate Partner Violence: Not At Risk  ? Fear of Current or Ex-Partner: No  ? Emotionally Abused: No  ? Physically Abused: No  ? Sexually Abused: No  ? ? ?FAMILY HISTORY:  ?Family History  ?Problem Relation Age of Onset  ? Cancer Mother   ?     breast  ? Diabetes Mother   ? Diabetes Sister   ? Hypertension Sister   ? Cancer Brother   ? Mental illness Sister   ? ? ?CURRENT MEDICATIONS:  ?Current Outpatient Medications  ?Medication Sig Dispense Refill  ? acyclovir (ZOVIRAX) 400 MG tablet Take 1 tablet (400 mg total) by mouth 2 (two) times daily. 60 tablet 5  ? aspirin EC 81 MG tablet Take 1  tablet (81 mg total) by mouth daily with breakfast. 30 tablet 5  ? calcium-vitamin D (OSCAL WITH D) 500-200 MG-UNIT tablet Take 1 tablet by mouth.    ? dexamethasone (DECADRON) 4 MG tablet Take 10 tablets (40 mg total) by mouth once a week. 40 tablet 6  ? filgrastim-aafi (NIVESTYM) 480 MCG/0.8ML SOSY injection Inject 2 - 480 mcg syringes under the skin daily for 7 days to equal a total daily dose of 960 mcg 11.2 mL 0  ? lenalidomide (REVLIMID) 10 MG capsule TAKE 1 CAPSULE BY MOUTH 1 TIME A DAY FOR 21 DAYS ON THEN 7 DAYS OFF 21 capsule 0  ? metoprolol succinate (TOPROL-XL) 25 MG 24 hr tablet Take 1 tablet (25 mg total) by mouth daily. 90 tablet 0  ? Multiple Vitamin (MULTIVITAMIN) tablet Take 1 tablet by mouth daily.      ?  rosuvastatin (CRESTOR) 40 MG tablet Take 1 tablet (40 mg total) by mouth every other day. 90 tablet 1  ? traMADol (ULTRAM) 50 MG tablet Take 1 tablet (50 mg total) by mouth every 6 (six) hours as needed. 60 tablet 0  ? nitroGLYCERIN (NITROSTAT) 0.4 MG SL tablet Place 1 tablet (0.4 mg total) under the tongue every 5 (five) minutes for 3 doses as needed for chest pain. (Patient not taking: Reported on 05/13/2021) 25 tablet 3  ? ?No current facility-administered medications for this visit.  ? ? ?ALLERGIES:  ?No Known Allergies ? ?PHYSICAL EXAM:  ?Performance status (ECOG): 1 - Symptomatic but completely ambulatory ? ?Vitals:  ? 05/13/21 1043  ?BP: 123/75  ?Pulse: 76  ?Resp: 20  ?Temp: (!) 96 ?F (35.6 ?C)  ?SpO2: 97%  ? ?Wt Readings from Last 3 Encounters:  ?05/13/21 224 lb 1.6 oz (101.7 kg)  ?04/08/21 222 lb 4.8 oz (100.8 kg)  ?02/26/21 231 lb (104.8 kg)  ? ?Physical Exam ?Vitals reviewed.  ?Constitutional:   ?   Appearance: Normal appearance. He is obese.  ?Cardiovascular:  ?   Rate and Rhythm: Normal rate and regular rhythm.  ?   Pulses: Normal pulses.  ?   Heart sounds: Normal heart sounds.  ?Pulmonary:  ?   Effort: Pulmonary effort is normal.  ?   Breath sounds: Normal breath sounds.   ?Neurological:  ?   General: No focal deficit present.  ?   Mental Status: He is alert and oriented to person, place, and time.  ?Psychiatric:     ?   Mood and Affect: Mood normal.     ?   Behavior: Behavior normal

## 2021-05-14 LAB — PROTEIN ELECTROPHORESIS, SERUM
A/G Ratio: 1.5 (ref 0.7–1.7)
Albumin ELP: 3.7 g/dL (ref 2.9–4.4)
Alpha-1-Globulin: 0.2 g/dL (ref 0.0–0.4)
Alpha-2-Globulin: 0.6 g/dL (ref 0.4–1.0)
Beta Globulin: 0.9 g/dL (ref 0.7–1.3)
Gamma Globulin: 0.7 g/dL (ref 0.4–1.8)
Globulin, Total: 2.4 g/dL (ref 2.2–3.9)
Total Protein ELP: 6.1 g/dL (ref 6.0–8.5)

## 2021-05-14 LAB — KAPPA/LAMBDA LIGHT CHAINS
Kappa free light chain: 19 mg/L (ref 3.3–19.4)
Kappa, lambda light chain ratio: 2.16 — ABNORMAL HIGH (ref 0.26–1.65)
Lambda free light chains: 8.8 mg/L (ref 5.7–26.3)

## 2021-05-16 ENCOUNTER — Ambulatory Visit: Payer: 59 | Admitting: Internal Medicine

## 2021-05-20 ENCOUNTER — Other Ambulatory Visit (HOSPITAL_COMMUNITY): Payer: Self-pay

## 2021-05-22 ENCOUNTER — Other Ambulatory Visit (HOSPITAL_COMMUNITY): Payer: Self-pay

## 2021-05-26 ENCOUNTER — Ambulatory Visit: Payer: 59 | Admitting: Internal Medicine

## 2021-05-26 ENCOUNTER — Encounter: Payer: Self-pay | Admitting: Internal Medicine

## 2021-05-26 VITALS — BP 124/82 | HR 61 | Resp 18 | Ht 69.0 in | Wt 224.2 lb

## 2021-05-26 DIAGNOSIS — R7303 Prediabetes: Secondary | ICD-10-CM

## 2021-05-26 DIAGNOSIS — I2510 Atherosclerotic heart disease of native coronary artery without angina pectoris: Secondary | ICD-10-CM

## 2021-05-26 DIAGNOSIS — E6609 Other obesity due to excess calories: Secondary | ICD-10-CM

## 2021-05-26 DIAGNOSIS — E782 Mixed hyperlipidemia: Secondary | ICD-10-CM

## 2021-05-26 DIAGNOSIS — C9000 Multiple myeloma not having achieved remission: Secondary | ICD-10-CM

## 2021-05-26 DIAGNOSIS — I10 Essential (primary) hypertension: Secondary | ICD-10-CM

## 2021-05-26 NOTE — Progress Notes (Signed)
? ?Established Patient Office Visit ? ?Subjective:  ?Patient ID: Eugene [name], male    DOB: 12/28/1955  Age: 66 y.o. MRN: [id number] ? ?CC:  ?Chief Complaint  ?Patient presents with  ? Follow-up  ?  6 month follow up HTN and CAD   ? ? ?HPI ?Eugene [name] is a 66 y.o. male with past medical history of CAD, HTN, multiple myeloma and obesity who presents for f/u of his chronic medical conditions. ? ?HTN: BP is well-controlled. Takes medications regularly. Patient denies headache, dizziness, chest pain, dyspnea or palpitations. ? ?CAD: Takes Aspirin and Metoprolol. Does not take Crestor regularly, advised to take it at least QOD. ?  ?MM: Followed by Oncology, undergoing chemotherapy. Has had stem cell collection at [facility]. ? ?He is planning to retire soon. ? ?Past Medical History:  ?Diagnosis Date  ? Back pain   ? CAD (coronary artery disease)   ? a. 01/2017 NSTEMI/Cath: LM nl, LAD 25p - ? hypodense but no filling defect, RI nl, LCX nl, RCA nl, EF 55-65%-->Med Rx.  ? Erectile dysfunction   ? History of echocardiogram   ? a. 01/2017 Echo: EF 55-60%, no rwma.  ? Hypertension   ? ? ?Past Surgical History:  ?Procedure Laterality Date  ? LEFT HEART CATH AND CORONARY ANGIOGRAPHY N/A 01/15/2017  ? Procedure: LEFT HEART CATH AND CORONARY ANGIOGRAPHY;  Surgeon: Martinique, Peter M, MD;  Location: [facility];  Service: Cardiovascular;  Laterality: N/A;  ? ORIF trocanteric femoral IM Nail Right   ? ? ?Family History  ?Problem Relation Age of Onset  ? Cancer Mother   ?     breast  ? Diabetes Mother   ? Diabetes Sister   ? Hypertension Sister   ? Cancer Brother   ? Mental illness Sister   ? ? ?Social History  ? ?Socioeconomic History  ? Marital status: Significant Other  ?  Spouse name: Not on file  ? Number of children: Not on file  ? Years of education: Not on file  ? Highest education level: Not on file  ?Occupational History  ? Not on file  ?Tobacco Use  ? Smoking status: Never  ? Smokeless tobacco: Never  ?Vaping Use  ?  Vaping Use: Never used  ?Substance and Sexual Activity  ? Alcohol use: No  ? Drug use: No  ? Sexual activity: Yes  ?Other Topics Concern  ? Not on file  ?Social History Narrative  ? Not on file  ? ?Social Determinants of Health  ? ?Financial Resource Strain: Low Risk   ? Difficulty of Paying Living Expenses: Not hard at all  ?Food Insecurity: No Food Insecurity  ? Worried About Charity fundraiser in the Last Year: Never true  ? Ran Out of Food in the Last Year: Never true  ?Transportation Needs: No Transportation Needs  ? Lack of Transportation (Medical): No  ? Lack of Transportation (Non-Medical): No  ?Physical Activity: Insufficiently Active  ? Days of Exercise per Week: 2 days  ? Minutes of Exercise per Session: 10 min  ?Stress: No Stress Concern Present  ? Feeling of Stress : Not at all  ?Social Connections: Moderately Integrated  ? Frequency of Communication with Friends and Family: More than three times a week  ? Frequency of Social Gatherings with Friends and Family: More than three times a week  ? Attends Religious Services: 1 to 4 times per year  ? Active Member of Clubs or Organizations: No  ? Attends Club  or Organization Meetings: 1 to 4 times per year  ? Marital Status: Divorced  ?Intimate Partner Violence: Not At Risk  ? Fear of Current or Ex-Partner: No  ? Emotionally Abused: No  ? Physically Abused: No  ? Sexually Abused: No  ? ? ?Outpatient Medications Prior to Visit  ?Medication Sig Dispense Refill  ? acyclovir (ZOVIRAX) 400 MG tablet Take 1 tablet (400 mg total) by mouth 2 (two) times daily. 60 tablet 5  ? aspirin EC 81 MG tablet Take 1 tablet (81 mg total) by mouth daily with breakfast. 30 tablet 5  ? calcium-vitamin D (OSCAL WITH D) 500-200 MG-UNIT tablet Take 1 tablet by mouth.    ? dexamethasone (DECADRON) 4 MG tablet Take 10 tablets (40 mg total) by mouth once a week. 40 tablet 6  ? filgrastim-aafi (NIVESTYM) 480 MCG/0.8ML SOSY injection Inject 2 - 480 mcg syringes under the skin daily for 7  days to equal a total daily dose of 960 mcg 11.2 mL 0  ? lenalidomide (REVLIMID) 10 MG capsule TAKE 1 CAPSULE BY MOUTH 1 TIME A DAY FOR 21 DAYS ON THEN 7 DAYS OFF 21 capsule 0  ? metoprolol succinate (TOPROL-XL) 25 MG 24 hr tablet Take 1 tablet (25 mg total) by mouth daily. 90 tablet 0  ? Multiple Vitamin (MULTIVITAMIN) tablet Take 1 tablet by mouth daily.      ? nitroGLYCERIN (NITROSTAT) 0.4 MG SL tablet Place 1 tablet (0.4 mg total) under the tongue every 5 (five) minutes for 3 doses as needed for chest pain. 25 tablet 3  ? rosuvastatin (CRESTOR) 40 MG tablet Take 1 tablet (40 mg total) by mouth every other day. 90 tablet 1  ? traMADol (ULTRAM) 50 MG tablet Take 1 tablet (50 mg total) by mouth every 6 (six) hours as needed. 60 tablet 0  ? ?No facility-administered medications prior to visit.  ? ? ?No Known Allergies ? ?ROS ?Review of Systems  ?Constitutional:  Positive for fatigue. Negative for chills and fever.  ?HENT:  Negative for congestion and sore throat.   ?Eyes:  Negative for pain and discharge.  ?Respiratory:  Negative for cough and shortness of breath.   ?Cardiovascular:  Negative for chest pain and palpitations.  ?Gastrointestinal:  Negative for constipation, diarrhea, nausea and vomiting.  ?Endocrine: Negative for polydipsia and polyuria.  ?Genitourinary:  Negative for dysuria and hematuria.  ?Musculoskeletal:  Positive for arthralgias. Negative for neck pain and neck stiffness.  ?Skin:  Negative for rash.  ?Neurological:  Negative for dizziness, weakness, numbness and headaches.  ?Psychiatric/Behavioral:  Negative for agitation and behavioral problems.   ? ?  ?Objective:  ?  ?Physical Exam ?Vitals reviewed.  ?Constitutional:   ?   General: He is not in acute distress. ?   Appearance: He is obese. He is not diaphoretic.  ?HENT:  ?   Head: Normocephalic and atraumatic.  ?   Nose: Nose normal.  ?   Mouth/Throat:  ?   Mouth: Mucous membranes are moist.  ?Eyes:  ?   General: No scleral icterus. ?    Extraocular Movements: Extraocular movements intact.  ?Cardiovascular:  ?   Rate and Rhythm: Normal rate and regular rhythm.  ?   Pulses: Normal pulses.  ?   Heart sounds: Normal heart sounds. No murmur heard. ?Pulmonary:  ?   Breath sounds: Normal breath sounds. No wheezing or rales.  ?Musculoskeletal:  ?   Cervical back: Neck supple. No tenderness.  ?   Right lower leg: No  edema.  ?   Left lower leg: No edema.  ?Skin: ?   General: Skin is warm.  ?   Findings: No rash.  ?Neurological:  ?   General: No focal deficit present.  ?   Mental Status: He is alert and oriented to person, place, and time.  ?   Sensory: No sensory deficit.  ?   Motor: No weakness.  ?Psychiatric:     ?   Mood and Affect: Mood normal.     ?   Behavior: Behavior normal.  ? ? ?BP 124/82 (BP Location: Left Arm, Patient Position: Sitting, Cuff Size: Normal)   Pulse 61   Resp 18   Ht _0  (1.753 m)   Wt 224 lb 3.2 oz (101.7 kg)   SpO2 98%   BMI 33.11 kg/m?  ?Wt Readings from Last 3 Encounters:  ?05/26/21 224 lb 3.2 oz (101.7 kg)  ?05/13/21 224 lb 1.6 oz (101.7 kg)  ?04/08/21 222 lb 4.8 oz (100.8 kg)  ? ? ?Lab Results  ?Component Value Date  ? TSH 2.140 07/16/2020  ? ?Lab Results  ?Component Value Date  ? WBC 4.0 05/13/2021  ? HGB 12.9 (L) 05/13/2021  ? HCT 39.4 05/13/2021  ? MCV 98.3 05/13/2021  ? PLT 248 05/13/2021  ? ?Lab Results  ?Component Value Date  ? NA 139 05/13/2021  ? K 3.6 05/13/2021  ? CO2 27 05/13/2021  ? GLUCOSE 126 (H) 05/13/2021  ? BUN 18 05/13/2021  ? CREATININE 1.00 05/13/2021  ? BILITOT 0.7 05/13/2021  ? ALKPHOS 64 05/13/2021  ? AST 30 05/13/2021  ? ALT 23 05/13/2021  ? PROT 6.8 05/13/2021  ? ALBUMIN 4.1 05/13/2021  ? CALCIUM 9.0 05/13/2021  ? ANIONGAP 7 05/13/2021  ? ?Lab Results  ?Component Value Date  ? CHOL 148 07/16/2020  ? ?Lab Results  ?Component Value Date  ? HDL 46 07/16/2020  ? ?Lab Results  ?Component Value Date  ? [place] 92 07/16/2020  ? ?Lab Results  ?Component Value Date  ? TRIG 48 07/16/2020  ? ?Lab Results   ?Component Value Date  ? CHOLHDL 3.2 07/16/2020  ? ?Lab Results  ?Component Value Date  ? HGBA1C 5.4 07/16/2020  ? ? ?  ?Assessment & Plan:  ? ?Problem List Items Addressed This Visit   ? ?  ? Cardiovascular and M

## 2021-05-26 NOTE — Assessment & Plan Note (Signed)
Diet modification and moderate exercise/walking advised 

## 2021-05-26 NOTE — Assessment & Plan Note (Signed)
Needs to take Crestor, at least QOD Check lipid profile 

## 2021-05-26 NOTE — Patient Instructions (Signed)
Please continue taking medications as prescribed.  Please continue to follow heart healthy diet and perform moderate exercise/walking at least 150 mins/week. 

## 2021-05-26 NOTE — Assessment & Plan Note (Signed)
BP Readings from Last 1 Encounters:  ?05/26/21 124/82  ? ?overall well-controlled with Metoprolol ?Counseled for compliance with the medications ?Advised DASH diet and moderate exercise/walking as tolerated ?

## 2021-05-26 NOTE — Assessment & Plan Note (Signed)
Has had cardiac cath in the past, no stents ?On Aspirin, statin and B-blocker ?F/u with Cardiology ?

## 2021-05-26 NOTE — Assessment & Plan Note (Addendum)
Undergoing chemotherapy for MM ?S/p stem cell collection at [facility] ?Followed by Oncology ?

## 2021-06-03 ENCOUNTER — Other Ambulatory Visit (HOSPITAL_COMMUNITY): Payer: Self-pay | Admitting: Hematology

## 2021-06-03 NOTE — Telephone Encounter (Signed)
Chart reviewed. Revlimid refilled per last office note with Dr. Katragadda.  

## 2021-06-04 DIAGNOSIS — C9000 Multiple myeloma not having achieved remission: Secondary | ICD-10-CM | POA: Diagnosis not present

## 2021-06-10 ENCOUNTER — Other Ambulatory Visit (HOSPITAL_COMMUNITY)
Admission: RE | Admit: 2021-06-10 | Discharge: 2021-06-10 | Disposition: A | Discharge status: Home / Self Care | Payer: 59 | Source: Ambulatory Visit | Attending: Internal Medicine | Admitting: Internal Medicine

## 2021-06-10 ENCOUNTER — Inpatient Hospital Stay (HOSPITAL_COMMUNITY): Payer: 59

## 2021-06-10 VITALS — BP 120/72 | HR 55 | Temp 96.4°F | Resp 20 | Wt 226.6 lb

## 2021-06-10 DIAGNOSIS — R7303 Prediabetes: Secondary | ICD-10-CM | POA: Insufficient documentation

## 2021-06-10 DIAGNOSIS — C9000 Multiple myeloma not having achieved remission: Secondary | ICD-10-CM

## 2021-06-10 DIAGNOSIS — E782 Mixed hyperlipidemia: Secondary | ICD-10-CM | POA: Insufficient documentation

## 2021-06-10 DIAGNOSIS — Z5112 Encounter for antineoplastic immunotherapy: Secondary | ICD-10-CM | POA: Diagnosis not present

## 2021-06-10 LAB — CBC WITH DIFFERENTIAL/PLATELET
Abs Immature Granulocytes: 0.01 10*3/uL (ref 0.00–0.07)
Basophils Absolute: 0 10*3/uL (ref 0.0–0.1)
Basophils Relative: 1 %
Eosinophils Absolute: 0.1 10*3/uL (ref 0.0–0.5)
Eosinophils Relative: 3 %
HCT: 40 % (ref 39.0–52.0)
Hemoglobin: 13.2 g/dL (ref 13.0–17.0)
Immature Granulocytes: 0 %
Lymphocytes Relative: 37 %
Lymphs Abs: 1.5 10*3/uL (ref 0.7–4.0)
MCH: 32.4 pg (ref 26.0–34.0)
MCHC: 33 g/dL (ref 30.0–36.0)
MCV: 98 fL (ref 80.0–100.0)
Monocytes Absolute: 0.6 10*3/uL (ref 0.1–1.0)
Monocytes Relative: 14 %
Neutro Abs: 1.9 10*3/uL (ref 1.7–7.7)
Neutrophils Relative %: 45 %
Platelets: 217 10*3/uL (ref 150–400)
RBC: 4.08 MIL/uL — ABNORMAL LOW (ref 4.22–5.81)
RDW: 12.9 % (ref 11.5–15.5)
WBC: 4.2 10*3/uL (ref 4.0–10.5)
nRBC: 0 % (ref 0.0–0.2)

## 2021-06-10 LAB — COMPREHENSIVE METABOLIC PANEL
ALT: 27 U/L (ref 0–44)
AST: 27 U/L (ref 15–41)
Albumin: 4 g/dL (ref 3.5–5.0)
Alkaline Phosphatase: 67 U/L (ref 38–126)
Anion gap: 5 (ref 5–15)
BUN: 20 mg/dL (ref 8–23)
CO2: 28 mmol/L (ref 22–32)
Calcium: 9.1 mg/dL (ref 8.9–10.3)
Chloride: 106 mmol/L (ref 98–111)
Creatinine, Ser: 1.03 mg/dL (ref 0.61–1.24)
GFR, Estimated: >60 mL/min (ref >60)
Glucose, Bld: 115 mg/dL — ABNORMAL HIGH (ref 70–99)
Potassium: 3.7 mmol/L (ref 3.5–5.1)
Sodium: 139 mmol/L (ref 135–145)
Total Bilirubin: 0.4 mg/dL (ref 0.3–1.2)
Total Protein: 6.9 g/dL (ref 6.5–8.1)

## 2021-06-10 LAB — LIPID PANEL
Cholesterol: 159 mg/dL (ref 0–200)
HDL: 49 mg/dL (ref >40)
LDL Cholesterol: 92 mg/dL (ref 0–99)
Total CHOL/HDL Ratio: 3.2 RATIO
Triglycerides: 90 mg/dL (ref <150)
VLDL: 18 mg/dL (ref 0–40)

## 2021-06-10 LAB — HEMOGLOBIN A1C
Hgb A1c MFr Bld: 4.9 % (ref 4.8–5.6)
Mean Plasma Glucose: 93.93 mg/dL

## 2021-06-10 LAB — MAGNESIUM: Magnesium: 1.8 mg/dL (ref 1.7–2.4)

## 2021-06-10 LAB — LACTATE DEHYDROGENASE: LDH: 146 U/L (ref 98–192)

## 2021-06-10 MED ORDER — ACETAMINOPHEN 325 MG PO TABS
650.0000 mg | ORAL_TABLET | Freq: Once | ORAL | Status: AC
  Administered 2021-06-10: 650 mg via ORAL
  Filled 2021-06-10: qty 2

## 2021-06-10 MED ORDER — DIPHENHYDRAMINE HCL 25 MG PO CAPS
25.0000 mg | ORAL_CAPSULE | Freq: Once | ORAL | Status: AC
  Administered 2021-06-10: 25 mg via ORAL
  Filled 2021-06-10: qty 1

## 2021-06-10 MED ORDER — DEXAMETHASONE 4 MG PO TABS
20.0000 mg | ORAL_TABLET | Freq: Once | ORAL | Status: AC
  Administered 2021-06-10: 20 mg via ORAL
  Filled 2021-06-10: qty 5

## 2021-06-10 MED ORDER — DARATUMUMAB-HYALURONIDASE-FIHJ 1800-30000 MG-UT/15ML ~~LOC~~ SOLN
1800.0000 mg | Freq: Once | SUBCUTANEOUS | Status: AC
  Administered 2021-06-10: 1800 mg via SUBCUTANEOUS
  Filled 2021-06-10: qty 15

## 2021-06-10 NOTE — Patient Instructions (Signed)
[facility]  Discharge Instructions: Thank you for choosing [facility] to provide your oncology and hematology care.  If you have a lab appointment with the [facility], please come in thru the Main Entrance and check in at the main information desk.  Wear comfortable clothing and clothing appropriate for easy access to any Portacath or PICC line.   We strive to give you quality time with your provider. You may need to reschedule your appointment if you arrive late (15 or more minutes).  Arriving late affects you and other patients whose appointments are after yours.  Also, if you miss three or more appointments without notifying the office, you may be dismissed from the clinic at the provider's discretion.      For prescription refill requests, have your pharmacy contact our office and allow 72 hours for refills to be completed.    Today you received the following chemotherapy and/or immunotherapy agents Dara SQ   To help prevent nausea and vomiting after your treatment, we encourage you to take your nausea medication as directed.  BELOW ARE SYMPTOMS THAT SHOULD BE REPORTED IMMEDIATELY: *FEVER GREATER THAN 100.4 F (38 C) OR HIGHER *CHILLS OR SWEATING *NAUSEA AND VOMITING THAT IS NOT CONTROLLED WITH YOUR NAUSEA MEDICATION *UNUSUAL SHORTNESS OF BREATH *UNUSUAL BRUISING OR BLEEDING *URINARY PROBLEMS (pain or burning when urinating, or frequent urination) *BOWEL PROBLEMS (unusual diarrhea, constipation, pain near the anus) TENDERNESS IN MOUTH AND THROAT WITH OR WITHOUT PRESENCE OF ULCERS (sore throat, sores in mouth, or a toothache) UNUSUAL RASH, SWELLING OR PAIN  UNUSUAL VAGINAL DISCHARGE OR ITCHING   Items with * indicate a potential emergency and should be followed up as soon as possible or go to the Emergency Department if any problems should occur.  Please show the CHEMOTHERAPY ALERT CARD or IMMUNOTHERAPY ALERT CARD at check-in to the Emergency Department  and triage nurse.  Should you have questions after your visit or need to cancel or reschedule your appointment, please contact [facility] [phone]  and follow the prompts.  Office hours are 8:00 a.m. to 4:30 p.m. Monday - Friday. Please note that voicemails left after 4:00 p.m. may not be returned until the following business day.  We are closed weekends and major holidays. You have access to a nurse at all times for urgent questions. Please call the main number to the clinic [phone] and follow the prompts.  For any non-urgent questions, you may also contact your provider using MyChart. We now offer e-Visits for anyone 62 and older to request care online for non-urgent symptoms. For details visit [email or web address].   Also download the MyChart app! Go to the app store, search "MyChart", open the app, select [facility], and log in with your MyChart username and password.  Due to Covid, a mask is required upon entering the hospital/clinic. If you do not have a mask, one will be given to you upon arrival. For doctor visits, patients may have 1 support person aged 61 or older with them. For treatment visits, patients cannot have anyone with them due to current Covid guidelines and our immunocompromised population.   Daratumumab injection What is this medication? DARATUMUMAB (dar a toom ue mab) is a monoclonal antibody. It is used to treat multiple myeloma. This medicine may be used for other purposes; ask your health care provider or pharmacist if you have questions. COMMON BRAND NAME(S): DARZALEX What should I tell my care team before I take this medication? They need  to know if you have any of these conditions: hereditary fructose intolerance infection (especially a virus infection such as chickenpox, herpes, or hepatitis B virus) lung or breathing disease (asthma, COPD) an unusual or allergic reaction to daratumumab, sorbitol, other medicines, foods, dyes, or  preservatives pregnant or trying to get pregnant breast-feeding How should I use this medication? This medicine is for infusion into a vein. It is given by a health care professional in a hospital or clinic setting. Talk to your pediatrician regarding the use of this medicine in children. Special care may be needed. Overdosage: If you think you have taken too much of this medicine contact a poison control center or emergency room at once. NOTE: This medicine is only for you. Do not share this medicine with others. What if I miss a dose? Keep appointments for follow-up doses as directed. It is important not to miss your dose. Call your doctor or health care professional if you are unable to keep an appointment. What may interact with this medication? Interactions have not been studied. This list may not describe all possible interactions. Give your health care provider a list of all the medicines, herbs, non-prescription drugs, or dietary supplements you use. Also tell them if you smoke, drink alcohol, or use illegal drugs. Some items may interact with your medicine. What should I watch for while using this medication? Your condition will be monitored carefully while you are receiving this medicine. This medicine can cause serious allergic reactions. To reduce your risk, your health care provider may give you other medicine to take before receiving this one. Be sure to follow the directions from your health care provider. This medicine can affect the results of blood tests to match your blood type. These changes can last for up to 6 months after the final dose. Your healthcare provider will do blood tests to match your blood type before you start treatment. Tell all of your healthcare providers that you are being treated with this medicine before receiving a blood transfusion. This medicine can affect the results of some tests used to determine treatment response; extra tests may be needed to  evaluate response. Do not become pregnant while taking this medicine or for 3 months after stopping it. Women should inform their health care provider if they wish to become pregnant or think they might be pregnant. There is a potential for serious side effects to an unborn child. Talk to your health care provider for more information. Do not breast-feed an infant while taking this medicine. What side effects may I notice from receiving this medication? Side effects that you should report to your care team as soon as possible: Allergic reactions--skin rash, itching or hives, swelling of the face, lips, or tongue Blurred vision Infection--fever, chills, cough, sore throat, pain or trouble passing urine Infusion reactions--dizziness, fast heartbeat, feeling faint or lightheaded, falls, headache, increase in blood pressure, nausea, vomiting, or wheezing or trouble breathing with loud or whistling sounds Unusual bleeding or bruising Side effects that usually do not require medical attention (report to your care team if they continue or are bothersome): Constipation Diarrhea Pain, tingling, numbness in the hands or feet Swelling of the ankles, feet, hands Tiredness This list may not describe all possible side effects. Call your doctor for medical advice about side effects. You may report side effects to FDA at 1-800-FDA-1088. Where should I keep my medication? This drug is given in a hospital or clinic and will not be stored at home. NOTE:  This sheet is a summary. It may not cover all possible information. If you have questions about this medicine, talk to your doctor, pharmacist, or health care provider.  2023 Elsevier/Gold Standard (2020-06-06 00:00:00)

## 2021-06-10 NOTE — Progress Notes (Signed)
Pt presents today for Daratumumab SQ per provider's order. Labs and vital signs WNL for treatment.Okay to proceed with treatment today.   Daratumumab SQ given today per MD orders. Tolerated infusion without adverse affects. Vital signs stable. No complaints at this time. Discharged from clinic ambulatory in stable condition. Alert and oriented x 3. F/U with [facility] as scheduled.

## 2021-06-11 LAB — KAPPA/LAMBDA LIGHT CHAINS
Kappa free light chain: 21.4 mg/L — ABNORMAL HIGH (ref 3.3–19.4)
Kappa, lambda light chain ratio: 1.98 — ABNORMAL HIGH (ref 0.26–1.65)
Lambda free light chains: 10.8 mg/L (ref 5.7–26.3)

## 2021-06-12 LAB — PROTEIN ELECTROPHORESIS, SERUM
A/G Ratio: 1.4 (ref 0.7–1.7)
Albumin ELP: 3.6 g/dL (ref 2.9–4.4)
Alpha-1-Globulin: 0.2 g/dL (ref 0.0–0.4)
Alpha-2-Globulin: 0.6 g/dL (ref 0.4–1.0)
Beta Globulin: 0.9 g/dL (ref 0.7–1.3)
Gamma Globulin: 0.8 g/dL (ref 0.4–1.8)
Globulin, Total: 2.5 g/dL (ref 2.2–3.9)
Total Protein ELP: 6.1 g/dL (ref 6.0–8.5)

## 2021-06-17 LAB — IMMUNOFIXATION ELECTROPHORESIS
IgA: 80 mg/dL (ref 61–437)
IgG (Immunoglobin G), Serum: 867 mg/dL (ref 603–1613)
IgM (Immunoglobulin M), Srm: 41 mg/dL (ref 20–172)
Total Protein ELP: 6.4 g/dL (ref 6.0–8.5)

## 2021-06-23 ENCOUNTER — Other Ambulatory Visit (HOSPITAL_COMMUNITY): Payer: Self-pay

## 2021-06-23 ENCOUNTER — Other Ambulatory Visit: Payer: Self-pay | Admitting: Cardiology

## 2021-06-23 MED ORDER — METOPROLOL SUCCINATE ER 25 MG PO TB24
25.0000 mg | ORAL_TABLET | Freq: Every day | ORAL | 0 refills | Status: DC
  Filled 2021-06-23: qty 90, 90d supply, fill #0

## 2021-07-02 ENCOUNTER — Other Ambulatory Visit (HOSPITAL_COMMUNITY): Payer: Self-pay | Admitting: Hematology

## 2021-07-02 ENCOUNTER — Other Ambulatory Visit (HOSPITAL_COMMUNITY): Payer: Self-pay

## 2021-07-02 MED ORDER — LENALIDOMIDE 10 MG PO CAPS: ORAL_CAPSULE | ORAL | 0 refills | Status: DC

## 2021-07-02 NOTE — Telephone Encounter (Signed)
Chart reviewed. Revlimid refilled per last office note with Dr. Katragadda.  

## 2021-07-03 ENCOUNTER — Ambulatory Visit (HOSPITAL_COMMUNITY)
Admission: RE | Admit: 2021-07-03 | Discharge: 2021-07-03 | Disposition: A | Discharge status: Home / Self Care | Payer: 59 | Source: Ambulatory Visit | Attending: Hematology | Admitting: Hematology

## 2021-07-03 ENCOUNTER — Encounter (HOSPITAL_COMMUNITY): Payer: Self-pay | Admitting: Surgery

## 2021-07-03 DIAGNOSIS — C9000 Multiple myeloma not having achieved remission: Secondary | ICD-10-CM | POA: Insufficient documentation

## 2021-07-03 MED ORDER — FLUDEOXYGLUCOSE F - 18 (FDG) INJECTION
11.3200 | Freq: Once | INTRAVENOUS | Status: AC | PRN
  Administered 2021-07-03: 11.32 via INTRAVENOUS

## 2021-07-03 NOTE — Progress Notes (Signed)
PA for Revlimid 10 mg had been submitted and approved by [employer] from 07/02/21 through 07/02/22 for a total of 12 fills of the medication.

## 2021-07-08 ENCOUNTER — Inpatient Hospital Stay (HOSPITAL_COMMUNITY): Payer: 59

## 2021-07-08 ENCOUNTER — Inpatient Hospital Stay (HOSPITAL_COMMUNITY): Payer: 59 | Attending: Hematology | Admitting: Hematology

## 2021-07-08 VITALS — BP 123/73 | HR 57 | Temp 96.9°F | Resp 20 | Ht 69.0 in | Wt 224.0 lb

## 2021-07-08 DIAGNOSIS — C9000 Multiple myeloma not having achieved remission: Secondary | ICD-10-CM

## 2021-07-08 DIAGNOSIS — Z5112 Encounter for antineoplastic immunotherapy: Secondary | ICD-10-CM | POA: Insufficient documentation

## 2021-07-08 DIAGNOSIS — E782 Mixed hyperlipidemia: Secondary | ICD-10-CM | POA: Diagnosis not present

## 2021-07-08 LAB — CBC WITH DIFFERENTIAL/PLATELET
Abs Immature Granulocytes: 0.01 10*3/uL (ref 0.00–0.07)
Basophils Absolute: 0 10*3/uL (ref 0.0–0.1)
Basophils Relative: 1 %
Eosinophils Absolute: 0.1 10*3/uL (ref 0.0–0.5)
Eosinophils Relative: 3 %
HCT: 38.6 % — ABNORMAL LOW (ref 39.0–52.0)
Hemoglobin: 12.9 g/dL — ABNORMAL LOW (ref 13.0–17.0)
Immature Granulocytes: 0 %
Lymphocytes Relative: 48 %
Lymphs Abs: 1.9 10*3/uL (ref 0.7–4.0)
MCH: 32.3 pg (ref 26.0–34.0)
MCHC: 33.4 g/dL (ref 30.0–36.0)
MCV: 96.5 fL (ref 80.0–100.0)
Monocytes Absolute: 0.6 10*3/uL (ref 0.1–1.0)
Monocytes Relative: 14 %
Neutro Abs: 1.4 10*3/uL — ABNORMAL LOW (ref 1.7–7.7)
Neutrophils Relative %: 34 %
Platelets: 204 10*3/uL (ref 150–400)
RBC: 4 MIL/uL — ABNORMAL LOW (ref 4.22–5.81)
RDW: 12.8 % (ref 11.5–15.5)
WBC: 4 10*3/uL (ref 4.0–10.5)
nRBC: 0 % (ref 0.0–0.2)

## 2021-07-08 LAB — COMPREHENSIVE METABOLIC PANEL
ALT: 27 U/L (ref 0–44)
AST: 30 U/L (ref 15–41)
Albumin: 3.9 g/dL (ref 3.5–5.0)
Alkaline Phosphatase: 60 U/L (ref 38–126)
Anion gap: 7 (ref 5–15)
BUN: 20 mg/dL (ref 8–23)
CO2: 27 mmol/L (ref 22–32)
Calcium: 8.6 mg/dL — ABNORMAL LOW (ref 8.9–10.3)
Chloride: 104 mmol/L (ref 98–111)
Creatinine, Ser: 1.09 mg/dL (ref 0.61–1.24)
GFR, Estimated: >60 mL/min (ref >60)
Glucose, Bld: 93 mg/dL (ref 70–99)
Potassium: 3.7 mmol/L (ref 3.5–5.1)
Sodium: 138 mmol/L (ref 135–145)
Total Bilirubin: 0.7 mg/dL (ref 0.3–1.2)
Total Protein: 6.9 g/dL (ref 6.5–8.1)

## 2021-07-08 LAB — MAGNESIUM: Magnesium: 1.7 mg/dL (ref 1.7–2.4)

## 2021-07-08 LAB — LACTATE DEHYDROGENASE: LDH: 142 U/L (ref 98–192)

## 2021-07-08 MED ORDER — ACETAMINOPHEN 325 MG PO TABS
650.0000 mg | ORAL_TABLET | Freq: Once | ORAL | Status: AC
  Administered 2021-07-08: 650 mg via ORAL
  Filled 2021-07-08: qty 2

## 2021-07-08 MED ORDER — DARATUMUMAB-HYALURONIDASE-FIHJ 1800-30000 MG-UT/15ML ~~LOC~~ SOLN
1800.0000 mg | Freq: Once | SUBCUTANEOUS | Status: AC
  Administered 2021-07-08: 1800 mg via SUBCUTANEOUS
  Filled 2021-07-08: qty 15

## 2021-07-08 MED ORDER — DIPHENHYDRAMINE HCL 25 MG PO CAPS
25.0000 mg | ORAL_CAPSULE | Freq: Once | ORAL | Status: AC
  Administered 2021-07-08: 25 mg via ORAL
  Filled 2021-07-08: qty 1

## 2021-07-08 MED ORDER — DEXAMETHASONE 4 MG PO TABS
20.0000 mg | ORAL_TABLET | Freq: Once | ORAL | Status: AC
  Administered 2021-07-08: 20 mg via ORAL
  Filled 2021-07-08: qty 5

## 2021-07-08 NOTE — Progress Notes (Signed)
Ok to treat with labs.  Hold zometa due to recent tooth extraction.  T.O. Dr Carilyn Goodpasture, PharmD

## 2021-07-08 NOTE — Progress Notes (Signed)
Patient is taking Revlimid as prescribed.  He has not missed any doses and reports no side effects at this time.   

## 2021-07-09 LAB — KAPPA/LAMBDA LIGHT CHAINS
Kappa free light chain: 19.9 mg/L — ABNORMAL HIGH (ref 3.3–19.4)
Kappa, lambda light chain ratio: 1.72 — ABNORMAL HIGH (ref 0.26–1.65)
Lambda free light chains: 11.6 mg/L (ref 5.7–26.3)

## 2021-07-10 LAB — PROTEIN ELECTROPHORESIS, SERUM
A/G Ratio: 1.5 (ref 0.7–1.7)
Albumin ELP: 3.6 g/dL (ref 2.9–4.4)
Alpha-1-Globulin: 0.2 g/dL (ref 0.0–0.4)
Alpha-2-Globulin: 0.6 g/dL (ref 0.4–1.0)
Beta Globulin: 0.9 g/dL (ref 0.7–1.3)
Gamma Globulin: 0.7 g/dL (ref 0.4–1.8)
Globulin, Total: 2.4 g/dL (ref 2.2–3.9)
Total Protein ELP: 6 g/dL (ref 6.0–8.5)

## 2021-07-21 ENCOUNTER — Other Ambulatory Visit (HOSPITAL_COMMUNITY): Payer: Self-pay

## 2021-07-28 ENCOUNTER — Encounter: Payer: Self-pay | Admitting: Cardiology

## 2021-07-28 ENCOUNTER — Ambulatory Visit: Payer: 59 | Admitting: Cardiology

## 2021-07-28 VITALS — BP 132/80 | HR 71 | Ht 69.0 in | Wt 224.8 lb

## 2021-07-28 DIAGNOSIS — I2510 Atherosclerotic heart disease of native coronary artery without angina pectoris: Secondary | ICD-10-CM

## 2021-07-28 DIAGNOSIS — I10 Essential (primary) hypertension: Secondary | ICD-10-CM | POA: Diagnosis not present

## 2021-07-28 DIAGNOSIS — E782 Mixed hyperlipidemia: Secondary | ICD-10-CM | POA: Diagnosis not present

## 2021-07-28 NOTE — Patient Instructions (Signed)
Medication Instructions:  Your physician recommends that you continue on your current medications as directed. Please refer to the Current Medication list given to you today.   Labwork: None  Testing/Procedures: None  Follow-Up: Follow up with Dr. Branch in 1 year.   Any Other Special Instructions Will Be Listed Below (If Applicable).     If you need a refill on your cardiac medications before your next appointment, please call your pharmacy.  

## 2021-07-28 NOTE — Progress Notes (Signed)
Clinical Summary Eugene [name] is a 66 y.o.male seen today for follow up of the following medical problems.    1. CAD - admit Jan 2019 with NSTEMI, peak trop 5.58 - cath without significant disease, questionable LAD plaque erosion.  - jan 2019 echo LVEF 55-60%     - no recent chest pain. No SOB/DOE - compliant withmeds    -ACEi stopped due to prior low bp's - no chest pains, no SOB/DOE - compliant with meds though can miss crestor.    2. HTN - he is compliant with meds   3. Hyperlipidemia  05/2019 TC 150 HDL 46 TG 51 LDL 91 - mixed compliance with crestor.     4. Multiple myeloma - followed by oncology   SH: works at [employer] with the [employer]. He is a Press photographer, Tourist information centre manager.       Past Medical History:  Diagnosis Date   Back pain    CAD (coronary artery disease)    a. 01/2017 NSTEMI/Cath: LM nl, LAD 25p - ? hypodense but no filling defect, RI nl, LCX nl, RCA nl, EF 55-65%-->Med Rx.   Erectile dysfunction    History of echocardiogram    a. 01/2017 Echo: EF 55-60%, no rwma.   Hypertension      No Known Allergies   Current Outpatient Medications  Medication Sig Dispense Refill   acyclovir (ZOVIRAX) 400 MG tablet Take 1 tablet (400 mg total) by mouth 2 (two) times daily. 60 tablet 5   aspirin EC 81 MG tablet Take 1 tablet (81 mg total) by mouth daily with breakfast. 30 tablet 5   calcium-vitamin D (OSCAL WITH D) 500-200 MG-UNIT tablet Take 1 tablet by mouth.     dexamethasone (DECADRON) 4 MG tablet Take 10 tablets (40 mg total) by mouth once a week. 40 tablet 6   filgrastim-aafi (NIVESTYM) 480 MCG/0.8ML SOSY injection Inject 2 - 480 mcg syringes under the skin daily for 7 days to equal a total daily dose of 960 mcg 11.2 mL 0   lenalidomide (REVLIMID) 10 MG capsule Take once daily for 21 days on, 7 days off 21 capsule 0   metoprolol succinate (TOPROL-XL) 25 MG 24 hr tablet Take 1 tablet (25 mg total) by mouth daily. 90 tablet 0   Multiple Vitamin  (MULTIVITAMIN) tablet Take 1 tablet by mouth daily.       nitroGLYCERIN (NITROSTAT) 0.4 MG SL tablet Place 1 tablet (0.4 mg total) under the tongue every 5 (five) minutes for 3 doses as needed for chest pain. 25 tablet 3   rosuvastatin (CRESTOR) 40 MG tablet Take 1 tablet (40 mg total) by mouth every other day. 90 tablet 1   traMADol (ULTRAM) 50 MG tablet Take 1 tablet (50 mg total) by mouth every 6 (six) hours as needed. 60 tablet 0   No current facility-administered medications for this visit.     Past Surgical History:  Procedure Laterality Date   LEFT HEART CATH AND CORONARY ANGIOGRAPHY N/A 01/15/2017   Procedure: LEFT HEART CATH AND CORONARY ANGIOGRAPHY;  Surgeon: Martinique, Peter M, MD;  Location: [facility];  Service: Cardiovascular;  Laterality: N/A;   ORIF trocanteric femoral IM Nail Right      No Known Allergies    Family History  Problem Relation Age of Onset   Cancer Mother        breast   Diabetes Mother    Diabetes Sister    Hypertension Sister  Cancer Brother    Mental illness Sister      Social History Eugene [name] reports that he has never smoked. He has never used smokeless tobacco. Eugene [name] reports no history of alcohol use.   Review of Systems CONSTITUTIONAL: No weight loss, fever, chills, weakness or fatigue.  HEENT: Eyes: No visual loss, blurred vision, double vision or yellow sclerae.No hearing loss, sneezing, congestion, runny nose or sore throat.  SKIN: No rash or itching.  CARDIOVASCULAR: per hpi RESPIRATORY: No shortness of breath, cough or sputum.  GASTROINTESTINAL: No anorexia, nausea, vomiting or diarrhea. No abdominal pain or blood.  GENITOURINARY: No burning on urination, no polyuria NEUROLOGICAL: No headache, dizziness, syncope, paralysis, ataxia, numbness or tingling in the extremities. No change in bowel or bladder control.  MUSCULOSKELETAL: No muscle, back pain, joint pain or stiffness.  LYMPHATICS: No enlarged nodes. No  history of splenectomy.  PSYCHIATRIC: No history of depression or anxiety.  ENDOCRINOLOGIC: No reports of sweating, cold or heat intolerance. No polyuria or polydipsia.  Marland Kitchen   Physical Examination Today's Vitals   07/28/21 0952  BP: 132/80  Pulse: 71  SpO2: 100%  Weight: 224 lb 12.8 oz (102 kg)  Height: _0  (1.753 m)   Body mass index is 33.2 kg/m.  Gen: resting comfortably, no acute distress HEENT: no scleral icterus, pupils equal round and reactive, no palptable cervical adenopathy,  CV: RRR, no m/r/g no jvd Resp: Clear to auscultation bilaterally GI: abdomen is soft, non-tender, non-distended, normal bowel sounds, no hepatosplenomegaly MSK: extremities are warm, 1+ bilateral LE edema Skin: warm, no rash Neuro:  no focal deficits Psych: appropriate affect   Diagnostic Studies   Jan 2019 echo Study Conclusions   - Left ventricle: The cavity size was normal. Wall thickness was   normal. Systolic function was normal. The estimated ejection   fraction was in the range of 55% to 60%. Wall motion was normal;   there were no regional wall motion abnormalities. Left   ventricular diastolic function parameters were normal. - Left atrium: The atrium was mildly dilated. - Pulmonary arteries: PA peak pressure: 33 mm Hg (S).   Jan 2019 cath Prox LAD lesion is 25% stenosed. The left ventricular systolic function is normal. LV end diastolic pressure is normal. The left ventricular ejection fraction is 55-65% by visual estimate.   1. No significant obstructive CAD. Initial views were concerning for hypodensity in the proximal LAD but there was poor filling due to poor catheter engagement. When a guide catheter was used with better filling this was less with no filling defect noted. 2. Normal LV function 3. Normal LVEDP   Plan: medical therapy. Cycle cardiac enzymes and Ecg. Check Echo.   Assessment and Plan  1. CAD -no recent symptoms, continue current meds - EKG SR, no  ischemic changes   2. HTN - he is at goal, continue current meds   3. Hyperlipidemia - encouraged more regular compliance with statin   F/u 1 year       Arnoldo Lenis, M.D.

## 2021-07-29 ENCOUNTER — Other Ambulatory Visit (HOSPITAL_COMMUNITY): Payer: Self-pay

## 2021-07-29 ENCOUNTER — Other Ambulatory Visit (HOSPITAL_COMMUNITY): Payer: Self-pay | Admitting: Hematology

## 2021-07-29 MED ORDER — LENALIDOMIDE 10 MG PO CAPS: ORAL_CAPSULE | ORAL | 0 refills | Status: DC

## 2021-07-29 NOTE — Telephone Encounter (Signed)
Chart reviewed. Revlimid refilled per last office note with Dr. Katragadda.  

## 2021-08-04 ENCOUNTER — Other Ambulatory Visit: Payer: Self-pay

## 2021-08-05 ENCOUNTER — Inpatient Hospital Stay (HOSPITAL_COMMUNITY): Payer: 59 | Attending: Hematology

## 2021-08-05 ENCOUNTER — Inpatient Hospital Stay (HOSPITAL_COMMUNITY): Payer: 59

## 2021-08-05 VITALS — BP 114/65 | HR 50 | Temp 98.0°F | Resp 20 | Ht 69.0 in | Wt 224.6 lb

## 2021-08-05 DIAGNOSIS — Z5112 Encounter for antineoplastic immunotherapy: Secondary | ICD-10-CM | POA: Insufficient documentation

## 2021-08-05 DIAGNOSIS — C9000 Multiple myeloma not having achieved remission: Secondary | ICD-10-CM | POA: Insufficient documentation

## 2021-08-05 LAB — CBC WITH DIFFERENTIAL/PLATELET
Abs Immature Granulocytes: 0 10*3/uL (ref 0.00–0.07)
Basophils Absolute: 0 10*3/uL (ref 0.0–0.1)
Basophils Relative: 1 %
Eosinophils Absolute: 0.1 10*3/uL (ref 0.0–0.5)
Eosinophils Relative: 4 %
HCT: 40.3 % (ref 39.0–52.0)
Hemoglobin: 13.5 g/dL (ref 13.0–17.0)
Immature Granulocytes: 0 %
Lymphocytes Relative: 48 %
Lymphs Abs: 1.8 10*3/uL (ref 0.7–4.0)
MCH: 32.1 pg (ref 26.0–34.0)
MCHC: 33.5 g/dL (ref 30.0–36.0)
MCV: 95.7 fL (ref 80.0–100.0)
Monocytes Absolute: 0.5 10*3/uL (ref 0.1–1.0)
Monocytes Relative: 14 %
Neutro Abs: 1.2 10*3/uL — ABNORMAL LOW (ref 1.7–7.7)
Neutrophils Relative %: 33 %
Platelets: 197 10*3/uL (ref 150–400)
RBC: 4.21 MIL/uL — ABNORMAL LOW (ref 4.22–5.81)
RDW: 13.2 % (ref 11.5–15.5)
WBC: 3.6 10*3/uL — ABNORMAL LOW (ref 4.0–10.5)
nRBC: 0 % (ref 0.0–0.2)

## 2021-08-05 LAB — COMPREHENSIVE METABOLIC PANEL
ALT: 31 U/L (ref 0–44)
AST: 34 U/L (ref 15–41)
Albumin: 4 g/dL (ref 3.5–5.0)
Alkaline Phosphatase: 61 U/L (ref 38–126)
Anion gap: 8 (ref 5–15)
BUN: 19 mg/dL (ref 8–23)
CO2: 23 mmol/L (ref 22–32)
Calcium: 8.9 mg/dL (ref 8.9–10.3)
Chloride: 108 mmol/L (ref 98–111)
Creatinine, Ser: 0.97 mg/dL (ref 0.61–1.24)
GFR, Estimated: >60 mL/min (ref >60)
Glucose, Bld: 108 mg/dL — ABNORMAL HIGH (ref 70–99)
Potassium: 3.7 mmol/L (ref 3.5–5.1)
Sodium: 139 mmol/L (ref 135–145)
Total Bilirubin: 0.7 mg/dL (ref 0.3–1.2)
Total Protein: 6.7 g/dL (ref 6.5–8.1)

## 2021-08-05 LAB — MAGNESIUM: Magnesium: 1.8 mg/dL (ref 1.7–2.4)

## 2021-08-05 LAB — LACTATE DEHYDROGENASE: LDH: 155 U/L (ref 98–192)

## 2021-08-05 MED ORDER — DARATUMUMAB-HYALURONIDASE-FIHJ 1800-30000 MG-UT/15ML ~~LOC~~ SOLN
1800.0000 mg | Freq: Once | SUBCUTANEOUS | Status: AC
  Administered 2021-08-05: 1800 mg via SUBCUTANEOUS
  Filled 2021-08-05: qty 15

## 2021-08-05 MED ORDER — DEXAMETHASONE 4 MG PO TABS
20.0000 mg | ORAL_TABLET | Freq: Once | ORAL | Status: AC
  Administered 2021-08-05: 20 mg via ORAL
  Filled 2021-08-05: qty 5

## 2021-08-05 MED ORDER — SODIUM CHLORIDE 0.9 % IV SOLN: INTRAVENOUS | Status: DC

## 2021-08-05 MED ORDER — ACETAMINOPHEN 325 MG PO TABS
650.0000 mg | ORAL_TABLET | Freq: Once | ORAL | Status: AC
  Administered 2021-08-05: 650 mg via ORAL
  Filled 2021-08-05: qty 2

## 2021-08-05 MED ORDER — ZOLEDRONIC ACID 4 MG/100ML IV SOLN
4.0000 mg | Freq: Once | INTRAVENOUS | Status: AC
  Administered 2021-08-05: 4 mg via INTRAVENOUS
  Filled 2021-08-05: qty 100

## 2021-08-05 MED ORDER — DIPHENHYDRAMINE HCL 25 MG PO CAPS
25.0000 mg | ORAL_CAPSULE | Freq: Once | ORAL | Status: AC
  Administered 2021-08-05: 25 mg via ORAL
  Filled 2021-08-05: qty 1

## 2021-08-05 NOTE — Patient Instructions (Signed)
[facility]  Discharge Instructions: Thank you for choosing [facility] to provide your oncology and hematology care.  If you have a lab appointment with the [facility], please come in thru the Main Entrance and check in at the main information desk.  Wear comfortable clothing and clothing appropriate for easy access to any Portacath or PICC line.   We strive to give you quality time with your provider. You may need to reschedule your appointment if you arrive late (15 or more minutes).  Arriving late affects you and other patients whose appointments are after yours.  Also, if you miss three or more appointments without notifying the office, you may be dismissed from the clinic at the provider's discretion.      For prescription refill requests, have your pharmacy contact our office and allow 72 hours for refills to be completed.    Today you received the following chemotherapy and/or immunotherapy agents Dara Sq and Zometa IV   To help prevent nausea and vomiting after your treatment, we encourage you to take your nausea medication as directed.  BELOW ARE SYMPTOMS THAT SHOULD BE REPORTED IMMEDIATELY: *FEVER GREATER THAN 100.4 F (38 C) OR HIGHER *CHILLS OR SWEATING *NAUSEA AND VOMITING THAT IS NOT CONTROLLED WITH YOUR NAUSEA MEDICATION *UNUSUAL SHORTNESS OF BREATH *UNUSUAL BRUISING OR BLEEDING *URINARY PROBLEMS (pain or burning when urinating, or frequent urination) *BOWEL PROBLEMS (unusual diarrhea, constipation, pain near the anus) TENDERNESS IN MOUTH AND THROAT WITH OR WITHOUT PRESENCE OF ULCERS (sore throat, sores in mouth, or a toothache) UNUSUAL RASH, SWELLING OR PAIN  UNUSUAL VAGINAL DISCHARGE OR ITCHING   Items with * indicate a potential emergency and should be followed up as soon as possible or go to the Emergency Department if any problems should occur.  Please show the CHEMOTHERAPY ALERT CARD or IMMUNOTHERAPY ALERT CARD at check-in to the  Emergency Department and triage nurse.  Should you have questions after your visit or need to cancel or reschedule your appointment, please contact [facility] [phone]  and follow the prompts.  Office hours are 8:00 a.m. to 4:30 p.m. Monday - Friday. Please note that voicemails left after 4:00 p.m. may not be returned until the following business day.  We are closed weekends and major holidays. You have access to a nurse at all times for urgent questions. Please call the main number to the clinic [phone] and follow the prompts.  For any non-urgent questions, you may also contact your provider using MyChart. We now offer e-Visits for anyone 58 and older to request care online for non-urgent symptoms. For details visit [email or web address].   Also download the MyChart app! Go to the app store, search "MyChart", open the app, select [facility], and log in with your MyChart username and password.  Masks are optional in the cancer centers. If you would like for your care team to wear a mask while they are taking care of you, please let them know. For doctor visits, patients may have with them one support person who is at least 64 years old. At this time, visitors are not allowed in the infusion area. Daratumumab; Hyaluronidase Injection What is this medication? DARATUMUMAB; HYALURONIDASE (dar a toom ue mab / hye al ur ON i dase) is a monoclonal antibody. Hyaluronidase is used to improve the effects of daratumumab. It treats certain types of cancer. Some of the cancers treated are multiple myeloma and light-chain amyloidosis. This medicine may be used for other purposes;  ask your health care provider or pharmacist if you have questions. COMMON BRAND NAME(S): DARZALEX FASPRO What should I tell my care team before I take this medication? They need to know if you have any of these conditions: heart disease infection especially a viral infection such as chickenpox, cold sores,  herpes, or hepatitis B lung or breathing disease an unusual or allergic reaction to daratumumab, hyaluronidase, other medicines, foods, dyes, or preservatives pregnant or trying to get pregnant breast-feeding How should I use this medication? This medicine is for injection under the skin. It is given by a health care professional in a hospital or clinic setting. Talk to your pediatrician regarding the use of this medicine in children. Special care may be needed. Overdosage: If you think you have taken too much of this medicine contact a poison control center or emergency room at once. NOTE: This medicine is only for you. Do not share this medicine with others. What if I miss a dose? Keep appointments for follow-up doses as directed. It is important not to miss your dose. Call your doctor or health care professional if you are unable to keep an appointment. What may interact with this medication? Interactions have not been studied. This list may not describe all possible interactions. Give your health care provider a list of all the medicines, herbs, non-prescription drugs, or dietary supplements you use. Also tell them if you smoke, drink alcohol, or use illegal drugs. Some items may interact with your medicine. What should I watch for while using this medication? Your condition will be monitored carefully while you are receiving this medicine. This medicine can cause serious allergic reactions. To reduce your risk, your health care provider may give you other medicine to take before receiving this one. Be sure to follow the directions from your health care provider. This medicine can affect the results of blood tests to match your blood type. These changes can last for up to 6 months after the final dose. Your healthcare provider will do blood tests to match your blood type before you start treatment. Tell all of your healthcare providers that you are being treated with this medicine before  receiving a blood transfusion. This medicine can affect the results of some tests used to determine treatment response; extra tests may be needed to evaluate response. Do not become pregnant while taking this medicine or for 3 months after stopping it. Women should inform their health care provider if they wish to become pregnant or think they might be pregnant. There is a potential for serious side effects to an unborn child. Talk to your health care provider for more information. Do not breast-feed an infant while taking this medicine. What side effects may I notice from receiving this medication? Side effects that you should report to your care team as soon as possible: Allergic reactions--skin rash, itching or hives, swelling of the face, lips, or tongue Blood clot--chest pain, shortness of breath, pain, swelling or warmth in the leg Blurred vision Fast, irregular heartbeat Infection--fever, chills, cough, sore throat, pain or trouble passing urine Injection reactions--dizziness, fast heartbeat, feeling faint or lightheaded, falls, headache, increase in blood pressure, nausea, vomiting, or wheezing or trouble breathing with loud or whistling sounds Low red blood cell counts--trouble breathing, feeling faint, lightheaded or falls, unusually weak or tired Unusual bleeding or bruising Side effects that usually do not require medical attention (report these to your care team if they continue or are bothersome): Back pain Constipation Diarrhea Pain, tingling, numbness  in the hands or feet Pain, redness, or irritation at site where injected Muscle cramp or pain Swelling of the ankles, feet, hands Tiredness Trouble sleeping This list may not describe all possible side effects. Call your doctor for medical advice about side effects. You may report side effects to FDA at 1-800-FDA-1088. Where should I keep my medication? This drug is given in a hospital or clinic and will not be stored at  home. NOTE: This sheet is a summary. It may not cover all possible information. If you have questions about this medicine, talk to your doctor, pharmacist, or health care provider.  2023 Elsevier/Gold Standard (2020-11-29 00:00:00)

## 2021-08-05 NOTE — Progress Notes (Signed)
Pt presents today for Dara SQ per provider's order. Vital signs and other labs WNL for treatment. Pt's ANC is 1.2 today. Dr.K made aware okay to proceed with treatment. Pt will restart Zometa today per Dr.K, pt has no more tooth extractions planned at this time. Okay to proceed with treatment today per Dr.K.  Daratumumab SQ and Zometa IV given today per MD orders. Tolerated infusion without adverse affects. Vital signs stable. No complaints at this time. Discharged from clinic ambulatory in stable condition. Alert and oriented x 3. F/U with [facility] as scheduled.

## 2021-08-05 NOTE — Progress Notes (Signed)
Ok to proceed with Zometa no more dental extractions planned at this time per MD.  T.O. Dr Rhys Martini, PharmD

## 2021-08-06 LAB — KAPPA/LAMBDA LIGHT CHAINS
Kappa free light chain: 25 mg/L — ABNORMAL HIGH (ref 3.3–19.4)
Kappa, lambda light chain ratio: 2.36 — ABNORMAL HIGH (ref 0.26–1.65)
Lambda free light chains: 10.6 mg/L (ref 5.7–26.3)

## 2021-08-12 ENCOUNTER — Other Ambulatory Visit: Payer: Self-pay

## 2021-08-19 ENCOUNTER — Other Ambulatory Visit (HOSPITAL_COMMUNITY): Payer: Self-pay

## 2021-08-26 ENCOUNTER — Other Ambulatory Visit: Payer: Self-pay

## 2021-08-26 ENCOUNTER — Other Ambulatory Visit (HOSPITAL_COMMUNITY): Payer: Self-pay | Admitting: Hematology

## 2021-08-26 MED ORDER — LENALIDOMIDE 10 MG PO CAPS: ORAL_CAPSULE | ORAL | 0 refills | Status: DC

## 2021-08-26 NOTE — Telephone Encounter (Signed)
Chart reviewed. Revlimid refilled per last office note with Dr. Katragadda.  

## 2021-09-02 ENCOUNTER — Inpatient Hospital Stay: Payer: 59

## 2021-09-02 ENCOUNTER — Inpatient Hospital Stay (HOSPITAL_COMMUNITY): Payer: 59 | Attending: Hematology

## 2021-09-02 VITALS — BP 130/82 | HR 51 | Temp 96.2°F | Resp 18 | Wt 225.5 lb

## 2021-09-02 DIAGNOSIS — C9000 Multiple myeloma not having achieved remission: Secondary | ICD-10-CM | POA: Insufficient documentation

## 2021-09-02 DIAGNOSIS — Z5112 Encounter for antineoplastic immunotherapy: Secondary | ICD-10-CM | POA: Insufficient documentation

## 2021-09-02 LAB — COMPREHENSIVE METABOLIC PANEL
ALT: 24 U/L (ref 0–44)
AST: 26 U/L (ref 15–41)
Albumin: 3.6 g/dL (ref 3.5–5.0)
Alkaline Phosphatase: 57 U/L (ref 38–126)
Anion gap: 5 (ref 5–15)
BUN: 15 mg/dL (ref 8–23)
CO2: 26 mmol/L (ref 22–32)
Calcium: 8.8 mg/dL — ABNORMAL LOW (ref 8.9–10.3)
Chloride: 107 mmol/L (ref 98–111)
Creatinine, Ser: 1.04 mg/dL (ref 0.61–1.24)
GFR, Estimated: >60 mL/min (ref >60)
Glucose, Bld: 115 mg/dL — ABNORMAL HIGH (ref 70–99)
Potassium: 3.6 mmol/L (ref 3.5–5.1)
Sodium: 138 mmol/L (ref 135–145)
Total Bilirubin: 0.7 mg/dL (ref 0.3–1.2)
Total Protein: 6.3 g/dL — ABNORMAL LOW (ref 6.5–8.1)

## 2021-09-02 LAB — CBC WITH DIFFERENTIAL/PLATELET
Abs Immature Granulocytes: 0.01 10*3/uL (ref 0.00–0.07)
Basophils Absolute: 0 10*3/uL (ref 0.0–0.1)
Basophils Relative: 1 %
Eosinophils Absolute: 0.2 10*3/uL (ref 0.0–0.5)
Eosinophils Relative: 5 %
HCT: 38.1 % — ABNORMAL LOW (ref 39.0–52.0)
Hemoglobin: 12.6 g/dL — ABNORMAL LOW (ref 13.0–17.0)
Immature Granulocytes: 0 %
Lymphocytes Relative: 45 %
Lymphs Abs: 1.8 10*3/uL (ref 0.7–4.0)
MCH: 32.1 pg (ref 26.0–34.0)
MCHC: 33.1 g/dL (ref 30.0–36.0)
MCV: 97.2 fL (ref 80.0–100.0)
Monocytes Absolute: 0.6 10*3/uL (ref 0.1–1.0)
Monocytes Relative: 15 %
Neutro Abs: 1.4 10*3/uL — ABNORMAL LOW (ref 1.7–7.7)
Neutrophils Relative %: 34 %
Platelets: 202 10*3/uL (ref 150–400)
RBC: 3.92 MIL/uL — ABNORMAL LOW (ref 4.22–5.81)
RDW: 13.3 % (ref 11.5–15.5)
WBC: 4 10*3/uL (ref 4.0–10.5)
nRBC: 0 % (ref 0.0–0.2)

## 2021-09-02 LAB — MAGNESIUM: Magnesium: 1.6 mg/dL — ABNORMAL LOW (ref 1.7–2.4)

## 2021-09-02 MED ORDER — MAGNESIUM SULFATE 2 GM/50ML IV SOLN
2.0000 g | Freq: Once | INTRAVENOUS | Status: AC
  Administered 2021-09-02: 2 g via INTRAVENOUS
  Filled 2021-09-02: qty 50

## 2021-09-02 MED ORDER — SODIUM CHLORIDE 0.9 % IV SOLN: INTRAVENOUS | Status: DC

## 2021-09-02 MED ORDER — DARATUMUMAB-HYALURONIDASE-FIHJ 1800-30000 MG-UT/15ML ~~LOC~~ SOLN
1800.0000 mg | Freq: Once | SUBCUTANEOUS | Status: AC
  Administered 2021-09-02: 1800 mg via SUBCUTANEOUS
  Filled 2021-09-02: qty 15

## 2021-09-02 MED ORDER — ZOLEDRONIC ACID 4 MG/100ML IV SOLN
4.0000 mg | Freq: Once | INTRAVENOUS | Status: AC
  Administered 2021-09-02: 4 mg via INTRAVENOUS
  Filled 2021-09-02: qty 100

## 2021-09-02 MED ORDER — ACETAMINOPHEN 325 MG PO TABS
650.0000 mg | ORAL_TABLET | Freq: Once | ORAL | Status: AC
  Administered 2021-09-02: 650 mg via ORAL
  Filled 2021-09-02: qty 2

## 2021-09-02 MED ORDER — DEXAMETHASONE 4 MG PO TABS
20.0000 mg | ORAL_TABLET | Freq: Once | ORAL | Status: AC
  Administered 2021-09-02: 20 mg via ORAL
  Filled 2021-09-02: qty 5

## 2021-09-02 MED ORDER — DIPHENHYDRAMINE HCL 25 MG PO CAPS
25.0000 mg | ORAL_CAPSULE | Freq: Once | ORAL | Status: AC
  Administered 2021-09-02: 25 mg via ORAL
  Filled 2021-09-02: qty 1

## 2021-09-02 MED ORDER — SODIUM CHLORIDE 0.9 % IV SOLN: Freq: Once | INTRAVENOUS | Status: AC

## 2021-09-02 NOTE — Progress Notes (Signed)
Labs reviewed with MD today. Ok to treat with ANC 1.4.   Treatment given per orders. Patient tolerated it well without problems. Vitals stable and discharged home from clinic ambulatory. Follow up as scheduled.

## 2021-09-02 NOTE — Patient Instructions (Signed)
[facility]  Discharge Instructions: Thank you for choosing [facility] to provide your oncology and hematology care.  If you have a lab appointment with the [facility], please come in thru the Main Entrance and check in at the main information desk.  Wear comfortable clothing and clothing appropriate for easy access to any Portacath or PICC line.   We strive to give you quality time with your provider. You may need to reschedule your appointment if you arrive late (15 or more minutes).  Arriving late affects you and other patients whose appointments are after yours.  Also, if you miss three or more appointments without notifying the office, you may be dismissed from the clinic at the provider's discretion.      For prescription refill requests, have your pharmacy contact our office and allow 72 hours for refills to be completed.    Today you received the following chemotherapy and/or immunotherapy agents darzalex, zometa, magnesium infusion      To help prevent nausea and vomiting after your treatment, we encourage you to take your nausea medication as directed.  BELOW ARE SYMPTOMS THAT SHOULD BE REPORTED IMMEDIATELY: *FEVER GREATER THAN 100.4 F (38 C) OR HIGHER *CHILLS OR SWEATING *NAUSEA AND VOMITING THAT IS NOT CONTROLLED WITH YOUR NAUSEA MEDICATION *UNUSUAL SHORTNESS OF BREATH *UNUSUAL BRUISING OR BLEEDING *URINARY PROBLEMS (pain or burning when urinating, or frequent urination) *BOWEL PROBLEMS (unusual diarrhea, constipation, pain near the anus) TENDERNESS IN MOUTH AND THROAT WITH OR WITHOUT PRESENCE OF ULCERS (sore throat, sores in mouth, or a toothache) UNUSUAL RASH, SWELLING OR PAIN  UNUSUAL VAGINAL DISCHARGE OR ITCHING   Items with * indicate a potential emergency and should be followed up as soon as possible or go to the Emergency Department if any problems should occur.  Please show the CHEMOTHERAPY ALERT CARD or IMMUNOTHERAPY ALERT CARD  at check-in to the Emergency Department and triage nurse.  Should you have questions after your visit or need to cancel or reschedule your appointment, please contact [facility] [phone]  and follow the prompts.  Office hours are 8:00 a.m. to 4:30 p.m. Monday - Friday. Please note that voicemails left after 4:00 p.m. may not be returned until the following business day.  We are closed weekends and major holidays. You have access to a nurse at all times for urgent questions. Please call the main number to the clinic [phone] and follow the prompts.  For any non-urgent questions, you may also contact your provider using MyChart. We now offer e-Visits for anyone 88 and older to request care online for non-urgent symptoms. For details visit [email or web address].   Also download the MyChart app! Go to the app store, search "MyChart", open the app, select [facility], and log in with your MyChart username and password.  Masks are optional in the cancer centers. If you would like for your care team to wear a mask while they are taking care of you, please let them know. You may have one support person who is at least 64 years old accompany you for your appointments.

## 2021-09-04 ENCOUNTER — Other Ambulatory Visit: Payer: Self-pay

## 2021-09-13 ENCOUNTER — Other Ambulatory Visit: Payer: Self-pay | Admitting: Hematology

## 2021-09-13 DIAGNOSIS — C9000 Multiple myeloma not having achieved remission: Secondary | ICD-10-CM

## 2021-09-22 ENCOUNTER — Other Ambulatory Visit (HOSPITAL_COMMUNITY): Payer: Self-pay

## 2021-09-22 ENCOUNTER — Other Ambulatory Visit: Payer: Self-pay | Admitting: Cardiology

## 2021-09-22 ENCOUNTER — Other Ambulatory Visit (HOSPITAL_COMMUNITY): Payer: Self-pay | Admitting: Hematology

## 2021-09-22 DIAGNOSIS — C9000 Multiple myeloma not having achieved remission: Secondary | ICD-10-CM

## 2021-09-22 MED ORDER — METOPROLOL SUCCINATE ER 25 MG PO TB24
25.0000 mg | ORAL_TABLET | Freq: Every day | ORAL | 3 refills | Status: DC
  Filled 2021-09-22: qty 58, 58d supply, fill #0
  Filled 2021-09-22: qty 32, 32d supply, fill #0
  Filled 2021-12-24: qty 90, 90d supply, fill #1
  Filled 2022-03-30: qty 90, 90d supply, fill #2

## 2021-09-23 ENCOUNTER — Encounter (HOSPITAL_COMMUNITY): Payer: Self-pay | Admitting: Hematology

## 2021-09-23 ENCOUNTER — Other Ambulatory Visit (HOSPITAL_COMMUNITY): Payer: Self-pay

## 2021-09-23 ENCOUNTER — Other Ambulatory Visit: Payer: Self-pay | Admitting: Hematology

## 2021-09-23 ENCOUNTER — Encounter: Payer: Self-pay | Admitting: Hematology

## 2021-09-23 MED ORDER — ACYCLOVIR 400 MG PO TABS
400.0000 mg | ORAL_TABLET | Freq: Two times a day (BID) | ORAL | 5 refills | Status: DC
  Filled 2021-09-23: qty 60, 30d supply, fill #0
  Filled 2021-10-20: qty 60, 30d supply, fill #1
  Filled 2021-11-19: qty 60, 30d supply, fill #2
  Filled 2021-12-16: qty 60, 30d supply, fill #3
  Filled 2022-01-19: qty 60, 30d supply, fill #4
  Filled 2022-02-23 – 2022-02-26 (×2): qty 60, 30d supply, fill #5

## 2021-09-24 ENCOUNTER — Encounter (HOSPITAL_COMMUNITY): Payer: Self-pay | Admitting: Hematology

## 2021-09-24 ENCOUNTER — Encounter: Payer: Self-pay | Admitting: Hematology

## 2021-09-24 NOTE — Telephone Encounter (Signed)
Chart reviewed. Revlimid refilled per last office note with Dr. Katragadda.  

## 2021-09-30 ENCOUNTER — Inpatient Hospital Stay (HOSPITAL_COMMUNITY): Payer: 59 | Attending: Hematology

## 2021-09-30 ENCOUNTER — Inpatient Hospital Stay: Payer: 59

## 2021-09-30 VITALS — BP 129/70 | HR 60 | Temp 96.6°F | Resp 18 | Wt 222.7 lb

## 2021-09-30 DIAGNOSIS — Z5112 Encounter for antineoplastic immunotherapy: Secondary | ICD-10-CM | POA: Diagnosis not present

## 2021-09-30 DIAGNOSIS — C9000 Multiple myeloma not having achieved remission: Secondary | ICD-10-CM | POA: Insufficient documentation

## 2021-09-30 LAB — CBC WITH DIFFERENTIAL/PLATELET
Abs Immature Granulocytes: 0.01 10*3/uL (ref 0.00–0.07)
Basophils Absolute: 0 10*3/uL (ref 0.0–0.1)
Basophils Relative: 1 %
Eosinophils Absolute: 0.1 10*3/uL (ref 0.0–0.5)
Eosinophils Relative: 3 %
HCT: 40.8 % (ref 39.0–52.0)
Hemoglobin: 13.6 g/dL (ref 13.0–17.0)
Immature Granulocytes: 0 %
Lymphocytes Relative: 41 %
Lymphs Abs: 1.6 10*3/uL (ref 0.7–4.0)
MCH: 32.1 pg (ref 26.0–34.0)
MCHC: 33.3 g/dL (ref 30.0–36.0)
MCV: 96.2 fL (ref 80.0–100.0)
Monocytes Absolute: 0.5 10*3/uL (ref 0.1–1.0)
Monocytes Relative: 13 %
Neutro Abs: 1.6 10*3/uL — ABNORMAL LOW (ref 1.7–7.7)
Neutrophils Relative %: 42 %
Platelets: 189 10*3/uL (ref 150–400)
RBC: 4.24 MIL/uL (ref 4.22–5.81)
RDW: 13.2 % (ref 11.5–15.5)
WBC: 3.9 10*3/uL — ABNORMAL LOW (ref 4.0–10.5)
nRBC: 0 % (ref 0.0–0.2)

## 2021-09-30 LAB — COMPREHENSIVE METABOLIC PANEL
ALT: 33 U/L (ref 0–44)
AST: 34 U/L (ref 15–41)
Albumin: 3.9 g/dL (ref 3.5–5.0)
Alkaline Phosphatase: 54 U/L (ref 38–126)
Anion gap: 8 (ref 5–15)
BUN: 21 mg/dL (ref 8–23)
CO2: 25 mmol/L (ref 22–32)
Calcium: 8.8 mg/dL — ABNORMAL LOW (ref 8.9–10.3)
Chloride: 106 mmol/L (ref 98–111)
Creatinine, Ser: 1.08 mg/dL (ref 0.61–1.24)
GFR, Estimated: >60 mL/min (ref >60)
Glucose, Bld: 103 mg/dL — ABNORMAL HIGH (ref 70–99)
Potassium: 3.9 mmol/L (ref 3.5–5.1)
Sodium: 139 mmol/L (ref 135–145)
Total Bilirubin: 0.9 mg/dL (ref 0.3–1.2)
Total Protein: 6.6 g/dL (ref 6.5–8.1)

## 2021-09-30 LAB — MAGNESIUM: Magnesium: 1.8 mg/dL (ref 1.7–2.4)

## 2021-09-30 LAB — LACTATE DEHYDROGENASE: LDH: 150 U/L (ref 98–192)

## 2021-09-30 MED ORDER — DARATUMUMAB-HYALURONIDASE-FIHJ 1800-30000 MG-UT/15ML ~~LOC~~ SOLN
1800.0000 mg | Freq: Once | SUBCUTANEOUS | Status: AC
  Administered 2021-09-30: 1800 mg via SUBCUTANEOUS
  Filled 2021-09-30: qty 15

## 2021-09-30 MED ORDER — DEXAMETHASONE 4 MG PO TABS
20.0000 mg | ORAL_TABLET | Freq: Once | ORAL | Status: AC
  Administered 2021-09-30: 20 mg via ORAL
  Filled 2021-09-30: qty 5

## 2021-09-30 MED ORDER — ZOLEDRONIC ACID 4 MG/100ML IV SOLN
4.0000 mg | Freq: Once | INTRAVENOUS | Status: AC
  Administered 2021-09-30: 4 mg via INTRAVENOUS
  Filled 2021-09-30: qty 100

## 2021-09-30 MED ORDER — SODIUM CHLORIDE 0.9 % IV SOLN: Freq: Once | INTRAVENOUS | Status: AC

## 2021-09-30 MED ORDER — DIPHENHYDRAMINE HCL 25 MG PO CAPS
25.0000 mg | ORAL_CAPSULE | Freq: Once | ORAL | Status: AC
  Administered 2021-09-30: 25 mg via ORAL
  Filled 2021-09-30: qty 1

## 2021-09-30 NOTE — Patient Instructions (Signed)
[facility]  Discharge Instructions: Thank you for choosing [facility] to provide your oncology and hematology care.  If you have a lab appointment with the [facility], please come in thru the Main Entrance and check in at the main information desk.  Wear comfortable clothing and clothing appropriate for easy access to any Portacath or PICC line.   We strive to give you quality time with your provider. You may need to reschedule your appointment if you arrive late (15 or more minutes).  Arriving late affects you and other patients whose appointments are after yours.  Also, if you miss three or more appointments without notifying the office, you may be dismissed from the clinic at the provider's discretion.      For prescription refill requests, have your pharmacy contact our office and allow 72 hours for refills to be completed.    Today you received the following chemotherapy and/or immunotherapy agents Dara and Zometa, return as scheduled.   To help prevent nausea and vomiting after your treatment, we encourage you to take your nausea medication as directed.  BELOW ARE SYMPTOMS THAT SHOULD BE REPORTED IMMEDIATELY: *FEVER GREATER THAN 100.4 F (38 C) OR HIGHER *CHILLS OR SWEATING *NAUSEA AND VOMITING THAT IS NOT CONTROLLED WITH YOUR NAUSEA MEDICATION *UNUSUAL SHORTNESS OF BREATH *UNUSUAL BRUISING OR BLEEDING *URINARY PROBLEMS (pain or burning when urinating, or frequent urination) *BOWEL PROBLEMS (unusual diarrhea, constipation, pain near the anus) TENDERNESS IN MOUTH AND THROAT WITH OR WITHOUT PRESENCE OF ULCERS (sore throat, sores in mouth, or a toothache) UNUSUAL RASH, SWELLING OR PAIN  UNUSUAL VAGINAL DISCHARGE OR ITCHING   Items with * indicate a potential emergency and should be followed up as soon as possible or go to the Emergency Department if any problems should occur.  Please show the CHEMOTHERAPY ALERT CARD or IMMUNOTHERAPY ALERT CARD  at check-in to the Emergency Department and triage nurse.  Should you have questions after your visit or need to cancel or reschedule your appointment, please contact [facility] [phone]  and follow the prompts.  Office hours are 8:00 a.m. to 4:30 p.m. Monday - Friday. Please note that voicemails left after 4:00 p.m. may not be returned until the following business day.  We are closed weekends and major holidays. You have access to a nurse at all times for urgent questions. Please call the main number to the clinic [phone] and follow the prompts.  For any non-urgent questions, you may also contact your provider using MyChart. We now offer e-Visits for anyone 93 and older to request care online for non-urgent symptoms. For details visit [email or web address].   Also download the MyChart app! Go to the app store, search "MyChart", open the app, select [facility], and log in with your MyChart username and password.  Masks are optional in the cancer centers. If you would like for your care team to wear a mask while they are taking care of you, please let them know. You may have one support person who is at least 64 years old accompany you for your appointments.

## 2021-09-30 NOTE — Progress Notes (Signed)
Patient tolerated therapy with no complaints voiced. Labs reviewed. Side effects with management reviewed with understanding verbalized. Peripheral IV site clean and dry with no bruising or swelling noted at site. Good blood return noted before and after administration of therapy. Band aid applied. Patient tolerated Daratumumab injection with no complaints voiced. See MAR for details. Lab reviewed. Injection site clean and dry with no bruising or swelling noted at site. Band aid applied. Vss with discharge and left in satisfactory condition with nos/s of distress noted.

## 2021-10-01 LAB — KAPPA/LAMBDA LIGHT CHAINS
Kappa free light chain: 21.2 mg/L — ABNORMAL HIGH (ref 3.3–19.4)
Kappa, lambda light chain ratio: 2 — ABNORMAL HIGH (ref 0.26–1.65)
Lambda free light chains: 10.6 mg/L (ref 5.7–26.3)

## 2021-10-03 LAB — PROTEIN ELECTROPHORESIS, SERUM
A/G Ratio: 1.5 (ref 0.7–1.7)
Albumin ELP: 3.7 g/dL (ref 2.9–4.4)
Alpha-1-Globulin: 0.2 g/dL (ref 0.0–0.4)
Alpha-2-Globulin: 0.6 g/dL (ref 0.4–1.0)
Beta Globulin: 0.9 g/dL (ref 0.7–1.3)
Gamma Globulin: 0.8 g/dL (ref 0.4–1.8)
Globulin, Total: 2.5 g/dL (ref 2.2–3.9)
Total Protein ELP: 6.2 g/dL (ref 6.0–8.5)

## 2021-10-07 ENCOUNTER — Inpatient Hospital Stay: Payer: 59 | Admitting: Hematology

## 2021-10-07 VITALS — BP 135/81 | HR 67 | Temp 97.1°F | Resp 18 | Ht 69.0 in | Wt 216.0 lb

## 2021-10-07 DIAGNOSIS — C9000 Multiple myeloma not having achieved remission: Secondary | ICD-10-CM

## 2021-10-07 DIAGNOSIS — Z5112 Encounter for antineoplastic immunotherapy: Secondary | ICD-10-CM | POA: Diagnosis not present

## 2021-10-07 NOTE — Progress Notes (Signed)
[place], [place] [address]   CLINIC:  Medical Oncology/Hematology  PCP:  [name], MD [address] / [place] [address] [phone]   REASON FOR VISIT:  Follow-up for multiple myeloma  PRIOR THERAPY: none  NGS Results: not done  CURRENT THERAPY: DaraVRd every 3 weeks x 6 cycles  BRIEF ONCOLOGIC HISTORY:  Oncology History  Multiple myeloma without remission ([facility])  07/09/2020 Pathology Results   BONE MARROW, ASPIRATE, CLOT, CORE:  -Hypercellular bone marrow with plasma cell neoplasm  -See comment   PERIPHERAL BLOOD:  -Slight macrocytic anemia   COMMENT:   The bone marrow is hypercellular for age with trilineage hematopoiesis and nonspecific myeloid changes.  In this background, the plasma cells are increased in number representing 10% of all cells associated with atypical cytomorphologic features.  The plasma cells display kappa light chain restriction consistent with plasma cell neoplasm.  Correlation with cytogenetic and FISH studies is recommended.    07/24/2020 Initial Diagnosis   Multiple myeloma without remission ([facility])   07/24/2020 Cancer Staging   Staging form: Plasma Cell Myeloma and Plasma Cell Disorders, AJCC 8th Edition - Clinical stage from 07/24/2020: RISS Stage I (Beta-2-microglobulin (mg/L): 2.1, Albumin (g/dL): 3.7, ISS: Stage I, High-risk cytogenetics: Absent, LDH: Normal) - Signed by Heath Lark, MD on 07/24/2020 Stage prefix: Initial diagnosis Beta 2 microglobulin range (mg/L): Less than 3.5 Albumin range (g/dL): Greater than or equal to 3.5 Cytogenetics: No abnormalities   09/03/2020 - 09/02/2021 Chemotherapy   Patient is on Treatment Plan : MYELOMA NEWLY DIAGNOSED TRANSPLANT CANDIDATE DaraVRd (Daratumumab SQ) q21d x 6 Cycles (Induction/Consolidation)     04/16/2021 -  Chemotherapy   Patient is on Treatment Plan : MYELOMA Daratumumab SQ q28d       CANCER STAGING:  Cancer Staging  Multiple myeloma  without remission ([facility]) Staging form: Plasma Cell Myeloma and Plasma Cell Disorders, AJCC 8th Edition - Clinical stage from 07/24/2020: RISS Stage I (Beta-2-microglobulin (mg/L): 2.1, Albumin (g/dL): 3.7, ISS: Stage I, High-risk cytogenetics: Absent, LDH: Normal) - Signed by Heath Lark, MD on 07/24/2020   INTERVAL HISTORY:  [name], a 66 y.o. male, seen for follow-up of multiple myeloma.  He is taking Revlimid without any major problems.  Reports energy level 75%.  No recent infections.  He reports muscle pains lasting 1 day after each Darzalex injection.   REVIEW OF SYSTEMS:  Review of Systems  All other systems reviewed and are negative.   PAST MEDICAL/SURGICAL HISTORY:  Past Medical History:  Diagnosis Date   Back pain    CAD (coronary artery disease)    a. 01/2017 NSTEMI/Cath: LM nl, LAD 25p - ? hypodense but no filling defect, RI nl, LCX nl, RCA nl, EF 55-65%-->Med Rx.   Erectile dysfunction    History of echocardiogram    a. 01/2017 Echo: EF 55-60%, no rwma.   Hypertension    Past Surgical History:  Procedure Laterality Date   LEFT HEART CATH AND CORONARY ANGIOGRAPHY N/A 01/15/2017   Procedure: LEFT HEART CATH AND CORONARY ANGIOGRAPHY;  Surgeon: Martinique, Peter M, MD;  Location: [facility];  Service: Cardiovascular;  Laterality: N/A;   ORIF trocanteric femoral IM Nail Right     SOCIAL HISTORY:  Social History   Socioeconomic History   Marital status: Significant Other    Spouse name: Not on file   Number of children: Not on file   Years of education: Not on file   Highest  education level: Not on file  Occupational History   Not on file  Tobacco Use   Smoking status: Never   Smokeless tobacco: Never  Vaping Use   Vaping Use: Never used  Substance and Sexual Activity   Alcohol use: No   Drug use: No   Sexual activity: Yes  Other Topics Concern   Not on file  Social History Narrative   Not on file   Social Determinants of Health   Financial  Resource Strain: Low Risk  (06/26/2020)   Overall Financial Resource Strain (CARDIA)    Difficulty of Paying Living Expenses: Not hard at all  Food Insecurity: No Food Insecurity (06/26/2020)   Hunger Vital Sign    Worried About Running Out of Food in the Last Year: Never true    Ran Out of Food in the Last Year: Never true  Transportation Needs: No Transportation Needs (06/26/2020)   PRAPARE - Transportation    Lack of Transportation (Medical): No    Lack of Transportation (Non-Medical): No  Physical Activity: Insufficiently Active (06/26/2020)   Exercise Vital Sign    Days of Exercise per Week: 2 days    Minutes of Exercise per Session: 10 min  Stress: No Stress Concern Present (06/26/2020)   [employer] - Occupational Stress Questionnaire    Feeling of Stress : Not at all  Social Connections: Moderately Integrated (06/26/2020)   Social Connection and Isolation Panel [NHANES]    Frequency of Communication with Friends and Family: More than three times a week    Frequency of Social Gatherings with Friends and Family: More than three times a week    Attends Religious Services: 1 to 4 times per year    Active Member of Clubs or Organizations: No    Attends Club or Organization Meetings: 1 to 4 times per year    Marital Status: Divorced  Intimate Partner Violence: Not At Risk (06/26/2020)   Humiliation, Afraid, Rape, and Kick questionnaire    Fear of Current or Ex-Partner: No    Emotionally Abused: No    Physically Abused: No    Sexually Abused: No    FAMILY HISTORY:  Family History  Problem Relation Age of Onset   Cancer Mother        breast   Diabetes Mother    Diabetes Sister    Hypertension Sister    Cancer Brother    Mental illness Sister     CURRENT MEDICATIONS:  Current Outpatient Medications  Medication Sig Dispense Refill   acyclovir (ZOVIRAX) 400 MG tablet Take 1 tablet (400 mg total) by mouth 2 (two) times daily. 60 tablet 5    calcium-vitamin D (OSCAL WITH D) 500-200 MG-UNIT tablet Take 1 tablet by mouth.     dexamethasone (DECADRON) 4 MG tablet Take 10 tablets (40 mg total) by mouth once a week. 40 tablet 6   filgrastim-aafi (NIVESTYM) 480 MCG/0.8ML SOSY injection Inject 2 - 480 mcg syringes under the skin daily for 7 days to equal a total daily dose of 960 mcg 11.2 mL 0   lenalidomide (REVLIMID) 10 MG capsule TAKE 1 CAPSULE BY MOUTH 1 TIME A DAY FOR 21 DAYS ON THEN 7 DAYS OFF 21 capsule 0   metoprolol succinate (TOPROL-XL) 25 MG 24 hr tablet Take 1 tablet (25 mg total) by mouth daily. 90 tablet 3   Multiple Vitamin (MULTIVITAMIN) tablet Take 1 tablet by mouth daily.       Multiple Vitamins/Iron TABS Take by mouth.       nitroGLYCERIN (NITROSTAT) 0.4 MG SL tablet Place 1 tablet (0.4 mg total) under the tongue every 5 (five) minutes for 3 doses as needed for chest pain. 25 tablet 3   rosuvastatin (CRESTOR) 40 MG tablet Take 1 tablet (40 mg total) by mouth every other day. 90 tablet 1   traMADol (ULTRAM) 50 MG tablet Take 1 tablet (50 mg total) by mouth every 6 (six) hours as needed. 60 tablet 0   No current facility-administered medications for this visit.    ALLERGIES:  No Known Allergies  PHYSICAL EXAM:  Performance status (ECOG): 1 - Symptomatic but completely ambulatory  Vitals:   10/07/21 1139  BP: 135/81  Pulse: 67  Resp: 18  Temp: (!) 97.1 F (36.2 C)  SpO2: 99%   Wt Readings from Last 3 Encounters:  10/07/21 216 lb (98 kg)  09/30/21 222 lb 11.2 oz (101 kg)  09/02/21 225 lb 8 oz (102.3 kg)   Physical Exam Vitals reviewed.  Constitutional:      Appearance: Normal appearance.  HENT:     Mouth/Throat:     Mouth: No oral lesions.     Palate: No mass.  Cardiovascular:     Rate and Rhythm: Normal rate and regular rhythm.     Pulses: Normal pulses.     Heart sounds: Normal heart sounds.  Pulmonary:     Effort: Pulmonary effort is normal.     Breath sounds: Normal breath sounds.   Musculoskeletal:     Right lower leg: No edema.     Left lower leg: No edema.  Neurological:     General: No focal deficit present.     Mental Status: He is alert and oriented to person, place, and time.  Psychiatric:        Mood and Affect: Mood normal.        Behavior: Behavior normal.     LABORATORY DATA:  I have reviewed the labs as listed.     Latest Ref Rng & Units 09/30/2021   10:18 AM 09/02/2021   10:17 AM 08/05/2021   12:24 PM  CBC  WBC 4.0 - 10.5 K/uL 3.9  4.0  3.6   Hemoglobin 13.0 - 17.0 g/dL 13.6  12.6  13.5   Hematocrit 39.0 - 52.0 % 40.8  38.1  40.3   Platelets 150 - 400 K/uL 189  202  197       Latest Ref Rng & Units 09/30/2021   10:19 AM 09/02/2021   10:17 AM 08/05/2021   12:24 PM  CMP  Glucose 70 - 99 mg/dL 103  115  108   BUN 8 - 23 mg/dL _0 Creatinine 0.61 - 1.24 mg/dL 1.08  1.04  0.97   Sodium 135 - 145 mmol/L 139  138  139   Potassium 3.5 - 5.1 mmol/L 3.9  3.6  3.7   Chloride 98 - 111 mmol/L 106  107  108   CO2 22 - 32 mmol/L _1 Calcium 8.9 - 10.3 mg/dL 8.8  8.8  8.9   Total Protein 6.5 - 8.1 g/dL 6.6  6.3  6.7   Total Bilirubin 0.3 - 1.2 mg/dL 0.9  0.7  0.7   Alkaline Phos 38 - 126 U/L 54  57  61   AST 15 - 41 U/L 34  26  34   ALT 0 - 44 U/L 33  24  31     DIAGNOSTIC IMAGING:  I have independently reviewed the scans and discussed with the patient. No results found.   ASSESSMENT:  1.  Standard risk plasma cell myeloma: - He reported low back pain radiating to the right thigh since April, pain gets worse when he walks for more than 2 blocks. - He had history of MGUS and was last seen in our clinic in 2019.  He had an abnormal M spike of 3.2 g on 12/06/2018. - Skeletal survey on 10/22/2017 showed no suspicious lytic lesions. - X-ray of the right sided pelvis on 06/11/2020 showed prominent lytic lesions noted in the proximal midportion of the right femoral diaphysis with no evidence of fracture or dislocation. - Bone marrow  biopsy on 07/09/2020 with hypercellular marrow with trilineage hematopoiesis.  Plasma cells representing 10% of cells. - Chromosome analysis 46, XY (20).  Multiple myeloma FISH panel negative. - PET scan on 07/04/2020 with multiple hypermetabolic bony soft tissue lesions.  Dominant lesions in the L5 vertebral body and posterior right acetabulum and distal right femur.  Focal increased uptake in the right tonsillar region with associated soft tissue fullness on CT imaging. - 24-hour urine with total protein 134. - Beta-2 microglobulin 2.1 (06/26/2020), LDH 136. - Dara RVD cycle 1 started on 09/03/2020. - He had stem cell collection at [facility].  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 - Maintenance daratumumab monthly and Revlimid 10 mg 3 weeks on/1 week off started on 04/08/2021. - We reviewed PET scan (07/03/2021): Lytic soft tissue lesion in the right L5 vertebral body measures 3.7 x 2.8 cm SUV 2.9, previously 3.4 x 3.4 cm, SUV 6.6.  Soft tissue lesion in the posterior right acetabulum measures 2.2 cm SUV 2.5, previously 2.2 cm with SUV 9.  Hypermetabolism in the left tonsil SUV 5.9.  Direct oropharyngeal exam did not show any mass.  New mildly hypermetabolic left lower pulmonary nodules nonspecific.  2.  Social/family history: - He works at [employer] in environmental services. - He was never smoker. - 2 brothers died of metastatic lung cancer.  Sister has thyroid cancer.  Mother had breast cancer.   PLAN:  1.  Stage I standard risk IgG kappa multiple myeloma: - Reviewed labs from 09/30/2021.  M spike is not detectable.  Kappa light chains at 21.2 and ratio is 2.0 which is slightly improved. - Continue Revlimid 3 weeks on/1 week off. - Continue monthly Darzalex. - Reports myalgias lasting 1 day after each Darzalex injection which is tolerable. - Recommend 3-month visit with repeat labs.  Will check Dara specific immunofixation at that time.  2.  Left mid thigh lesion: - He does  not report any left mid thigh pain.  3.  Infection prophylaxis: - Continue acyclovir twice daily.  Continue aspirin 81 mg daily.  4.  Myeloma bone disease: - Continue Zometa monthly.  No dental issues reported.  Calcium is normal.   Orders placed this encounter:  No orders of the defined types were placed in this encounter.    Sreedhar Katragadda, MD [facility] [phone]      

## 2021-10-07 NOTE — Patient Instructions (Signed)
[facility]  Discharge Instructions  You were seen and examined today by Dr. Delton Coombes.  Your labs are good. No M-Spike Detected!  Continue Revlimid at home and Dara injections in the clinic.  Follow-up as scheduled.   Thank you for choosing [facility] to provide your oncology and hematology care.   To afford each patient quality time with our provider, please arrive at least 15 minutes before your scheduled appointment time. You may need to reschedule your appointment if you arrive late (10 or more minutes). Arriving late affects you and other patients whose appointments are after yours.  Also, if you miss three or more appointments without notifying the office, you may be dismissed from the clinic at the provider's discretion.    Again, thank you for choosing [facility].  Our hope is that these requests will decrease the amount of time that you wait before being seen by our physicians.   If you have a lab appointment with the [facility] please come in thru the Main Entrance and check in at the main information desk.           _____________________________________________________________  Should you have questions after your visit to [facility], please contact our office at [phone] and follow the prompts.  Our office hours are 8:00 a.m. to 4:30 p.m. Monday - Thursday and 8:00 a.m. to 2:30 p.m. Friday.  Please note that voicemails left after 4:00 p.m. may not be returned until the following business day.  We are closed weekends and all major holidays.  You do have access to a nurse 24-7, just call the main number to the clinic [phone] and do not press any options, hold on the line and a nurse will answer the phone.    For prescription refill requests, have your pharmacy contact our office and allow 72 hours.    Masks are optional in the cancer centers. If you would like for your care team to  wear a mask while they are taking care of you, please let them know. You may have one support person who is at least 63 years old accompany you for your appointments.

## 2021-10-08 ENCOUNTER — Other Ambulatory Visit: Payer: Self-pay

## 2021-10-09 ENCOUNTER — Other Ambulatory Visit: Payer: Self-pay

## 2021-10-14 DIAGNOSIS — C9000 Multiple myeloma not having achieved remission: Secondary | ICD-10-CM | POA: Diagnosis not present

## 2021-10-14 DIAGNOSIS — Z9889 Other specified postprocedural states: Secondary | ICD-10-CM | POA: Diagnosis not present

## 2021-10-14 DIAGNOSIS — M1611 Unilateral primary osteoarthritis, right hip: Secondary | ICD-10-CM | POA: Diagnosis not present

## 2021-10-14 DIAGNOSIS — Z4789 Encounter for other orthopedic aftercare: Secondary | ICD-10-CM | POA: Diagnosis not present

## 2021-10-14 DIAGNOSIS — C9001 Multiple myeloma in remission: Secondary | ICD-10-CM | POA: Diagnosis not present

## 2021-10-19 ENCOUNTER — Other Ambulatory Visit: Payer: Self-pay

## 2021-10-20 ENCOUNTER — Other Ambulatory Visit (HOSPITAL_COMMUNITY): Payer: Self-pay

## 2021-10-21 ENCOUNTER — Other Ambulatory Visit (HOSPITAL_COMMUNITY): Payer: Self-pay

## 2021-10-22 ENCOUNTER — Other Ambulatory Visit: Payer: Self-pay | Admitting: Hematology

## 2021-10-24 ENCOUNTER — Other Ambulatory Visit: Payer: Self-pay

## 2021-10-24 MED ORDER — LENALIDOMIDE 10 MG PO CAPS: ORAL_CAPSULE | ORAL | 0 refills | Status: DC

## 2021-10-24 NOTE — Telephone Encounter (Signed)
Chart reviewed. Revlimid refilled per last office note with Dr. Katragadda.  

## 2021-10-28 ENCOUNTER — Inpatient Hospital Stay: Payer: 59

## 2021-10-28 ENCOUNTER — Inpatient Hospital Stay: Payer: 59 | Attending: Hematology

## 2021-10-28 VITALS — BP 135/64 | HR 74 | Temp 96.5°F | Resp 16

## 2021-10-28 DIAGNOSIS — Z5112 Encounter for antineoplastic immunotherapy: Secondary | ICD-10-CM | POA: Diagnosis present

## 2021-10-28 DIAGNOSIS — C9000 Multiple myeloma not having achieved remission: Secondary | ICD-10-CM | POA: Insufficient documentation

## 2021-10-28 LAB — CBC WITH DIFFERENTIAL/PLATELET
Abs Immature Granulocytes: 0 10*3/uL (ref 0.00–0.07)
Basophils Absolute: 0 10*3/uL (ref 0.0–0.1)
Basophils Relative: 0 %
Eosinophils Absolute: 0.1 10*3/uL (ref 0.0–0.5)
Eosinophils Relative: 3 %
HCT: 38.6 % — ABNORMAL LOW (ref 39.0–52.0)
Hemoglobin: 12.9 g/dL — ABNORMAL LOW (ref 13.0–17.0)
Immature Granulocytes: 0 %
Lymphocytes Relative: 49 %
Lymphs Abs: 2.3 10*3/uL (ref 0.7–4.0)
MCH: 32.3 pg (ref 26.0–34.0)
MCHC: 33.4 g/dL (ref 30.0–36.0)
MCV: 96.7 fL (ref 80.0–100.0)
Monocytes Absolute: 0.6 10*3/uL (ref 0.1–1.0)
Monocytes Relative: 14 %
Neutro Abs: 1.6 10*3/uL — ABNORMAL LOW (ref 1.7–7.7)
Neutrophils Relative %: 34 %
Platelets: 215 10*3/uL (ref 150–400)
RBC: 3.99 MIL/uL — ABNORMAL LOW (ref 4.22–5.81)
RDW: 13.1 % (ref 11.5–15.5)
WBC: 4.7 10*3/uL (ref 4.0–10.5)
nRBC: 0 % (ref 0.0–0.2)

## 2021-10-28 LAB — COMPREHENSIVE METABOLIC PANEL
ALT: 27 U/L (ref 0–44)
AST: 28 U/L (ref 15–41)
Albumin: 3.8 g/dL (ref 3.5–5.0)
Alkaline Phosphatase: 64 U/L (ref 38–126)
Anion gap: 6 (ref 5–15)
BUN: 19 mg/dL (ref 8–23)
CO2: 28 mmol/L (ref 22–32)
Calcium: 8.7 mg/dL — ABNORMAL LOW (ref 8.9–10.3)
Chloride: 105 mmol/L (ref 98–111)
Creatinine, Ser: 1.06 mg/dL (ref 0.61–1.24)
GFR, Estimated: >60 mL/min (ref >60)
Glucose, Bld: 74 mg/dL (ref 70–99)
Potassium: 3.5 mmol/L (ref 3.5–5.1)
Sodium: 139 mmol/L (ref 135–145)
Total Bilirubin: 0.6 mg/dL (ref 0.3–1.2)
Total Protein: 6.9 g/dL (ref 6.5–8.1)

## 2021-10-28 LAB — MAGNESIUM: Magnesium: 1.8 mg/dL (ref 1.7–2.4)

## 2021-10-28 LAB — LACTATE DEHYDROGENASE: LDH: 133 U/L (ref 98–192)

## 2021-10-28 MED ORDER — SODIUM CHLORIDE 0.9 % IV SOLN: Freq: Once | INTRAVENOUS | Status: AC

## 2021-10-28 MED ORDER — DEXAMETHASONE 4 MG PO TABS
20.0000 mg | ORAL_TABLET | Freq: Once | ORAL | Status: AC
  Administered 2021-10-28: 20 mg via ORAL
  Filled 2021-10-28: qty 5

## 2021-10-28 MED ORDER — ZOLEDRONIC ACID 4 MG/100ML IV SOLN
4.0000 mg | Freq: Once | INTRAVENOUS | Status: AC
  Administered 2021-10-28: 4 mg via INTRAVENOUS
  Filled 2021-10-28: qty 100

## 2021-10-28 MED ORDER — DARATUMUMAB-HYALURONIDASE-FIHJ 1800-30000 MG-UT/15ML ~~LOC~~ SOLN
1800.0000 mg | Freq: Once | SUBCUTANEOUS | Status: AC
  Administered 2021-10-28: 1800 mg via SUBCUTANEOUS
  Filled 2021-10-28: qty 15

## 2021-10-28 MED ORDER — DIPHENHYDRAMINE HCL 25 MG PO CAPS
25.0000 mg | ORAL_CAPSULE | Freq: Once | ORAL | Status: AC
  Administered 2021-10-28: 25 mg via ORAL
  Filled 2021-10-28: qty 1

## 2021-10-28 NOTE — Progress Notes (Signed)
Labs reviewed, ok to treat per parameters.

## 2021-10-28 NOTE — Progress Notes (Signed)
Dara and Zometa IV given today per MD orders. Tolerated infusion without adverse affects. Vital signs stable. No complaints at this time. Discharged from clinic ambulatory in stable condition. Alert and oriented x 3. F/U with [facility] as scheduled.

## 2021-10-28 NOTE — Patient Instructions (Signed)
[facility]  Discharge Instructions: Thank you for choosing [facility] to provide your oncology and hematology care.  If you have a lab appointment with the [facility], please come in thru the Main Entrance and check in at the main information desk.  Wear comfortable clothing and clothing appropriate for easy access to any Portacath or PICC line.   We strive to give you quality time with your provider. You may need to reschedule your appointment if you arrive late (15 or more minutes).  Arriving late affects you and other patients whose appointments are after yours.  Also, if you miss three or more appointments without notifying the office, you may be dismissed from the clinic at the provider's discretion.      For prescription refill requests, have your pharmacy contact our office and allow 72 hours for refills to be completed.    Today you received the following chemotherapy and/or immunotherapy agents dara [place] and Zometa IV   To help prevent nausea and vomiting after your treatment, we encourage you to take your nausea medication as directed.  Daratumumab Injection What is this medication? DARATUMUMAB (dar a toom ue mab) treats multiple myeloma, a type of bone marrow cancer. It works by helping your immune system slow or stop the spread of cancer cells. It is a monoclonal antibody. This medicine may be used for other purposes; ask your health care provider or pharmacist if you have questions. COMMON BRAND NAME(S): DARZALEX What should I tell my care team before I take this medication? They need to know if you have any of these conditions: Hereditary fructose intolerance Infection, such as chickenpox, herpes, hepatitis B virus Lung or breathing disease, such as asthma, COPD An unusual or allergic reaction to daratumumab, sorbitol, other medications, foods, dyes, or preservatives Pregnant or trying to get pregnant Breast-feeding How should I use this  medication? This medication is injected into a vein. It is given by your care team in a hospital or clinic setting. Talk to your care team about the use of this medication in children. Special care may be needed. Overdosage: If you think you have taken too much of this medicine contact a poison control center or emergency room at once. NOTE: This medicine is only for you. Do not share this medicine with others. What if I miss a dose? Keep appointments for follow-up doses. It is important not to miss your dose. Call your care team if you are unable to keep an appointment. What may interact with this medication? Interactions have not been studied. This list may not describe all possible interactions. Give your health care provider a list of all the medicines, herbs, non-prescription drugs, or dietary supplements you use. Also tell them if you smoke, drink alcohol, or use illegal drugs. Some items may interact with your medicine. What should I watch for while using this medication? Your condition will be monitored carefully while you are receiving this medication. This medication can cause serious allergic reactions. To reduce your risk, your care team may give you other medication to take before receiving this one. Be sure to follow the directions from your care team. This medication can affect the results of blood tests to match your blood type. These changes can last for up to 6 months after the final dose. Your care team will do blood tests to match your blood type before you start treatment. Tell all of your care team that you are being treated with this  medication before receiving a blood transfusion. This medication can affect the results of some tests used to determine treatment response; extra tests may be needed to evaluate response. Talk to your care team if you wish to become pregnant or think you are pregnant. This medication can cause serious birth defects if taken during pregnancy and for  3 months after the last dose. A reliable form of contraception is recommended while taking this medication and for 3 months after the last dose. Talk to your care team about effective forms of contraception. Do not breast-feed while taking this medication. What side effects may I notice from receiving this medication? Side effects that you should report to your care team as soon as possible: Allergic reactions--skin rash, itching, hives, swelling of the face, lips, tongue, or throat Infection--fever, chills, cough, sore throat, wounds that don't heal, pain or trouble when passing urine, general feeling of discomfort or being unwell Infusion reactions--chest pain, shortness of breath or trouble breathing, feeling faint or lightheaded Unusual bruising or bleeding Side effects that usually do not require medical attention (report to your care team if they continue or are bothersome): Constipation Diarrhea Fatigue Nausea Pain, tingling, or numbness in the hands or feet Swelling of the ankles, hands, or feet This list may not describe all possible side effects. Call your doctor for medical advice about side effects. You may report side effects to FDA at 1-800-FDA-1088. Where should I keep my medication? This medication is given in a hospital or clinic. It will not be stored at home. NOTE: This sheet is a summary. It may not cover all possible information. If you have questions about this medicine, talk to your doctor, pharmacist, or health care provider.  2023 Elsevier/Gold Standard (2013-11-27 00:00:00)   BELOW ARE SYMPTOMS THAT SHOULD BE REPORTED IMMEDIATELY: *FEVER GREATER THAN 100.4 F (38 C) OR HIGHER *CHILLS OR SWEATING *NAUSEA AND VOMITING THAT IS NOT CONTROLLED WITH YOUR NAUSEA MEDICATION *UNUSUAL SHORTNESS OF BREATH *UNUSUAL BRUISING OR BLEEDING *URINARY PROBLEMS (pain or burning when urinating, or frequent urination) *BOWEL PROBLEMS (unusual diarrhea, constipation, pain near the  anus) TENDERNESS IN MOUTH AND THROAT WITH OR WITHOUT PRESENCE OF ULCERS (sore throat, sores in mouth, or a toothache) UNUSUAL RASH, SWELLING OR PAIN  UNUSUAL VAGINAL DISCHARGE OR ITCHING   Items with * indicate a potential emergency and should be followed up as soon as possible or go to the Emergency Department if any problems should occur.  Please show the CHEMOTHERAPY ALERT CARD or IMMUNOTHERAPY ALERT CARD at check-in to the Emergency Department and triage nurse.  Should you have questions after your visit or need to cancel or reschedule your appointment, please contact [facility] [phone]  and follow the prompts.  Office hours are 8:00 a.m. to 4:30 p.m. Monday - Friday. Please note that voicemails left after 4:00 p.m. may not be returned until the following business day.  We are closed weekends and major holidays. You have access to a nurse at all times for urgent questions. Please call the main number to the clinic [phone] and follow the prompts.  For any non-urgent questions, you may also contact your provider using MyChart. We now offer e-Visits for anyone 21 and older to request care online for non-urgent symptoms. For details visit [email or web address].   Also download the MyChart app! Go to the app store, search "MyChart", open the app, select [facility], and log in with your MyChart username and password.  Masks are optional in the cancer  centers. If you would like for your care team to wear a mask while they are taking care of you, please let them know. You may have one support person who is at least 64 years old accompany you for your appointments.

## 2021-10-30 LAB — PROTEIN ELECTROPHORESIS, SERUM
A/G Ratio: 1.3 (ref 0.7–1.7)
Albumin ELP: 3.6 g/dL (ref 2.9–4.4)
Alpha-1-Globulin: 0.2 g/dL (ref 0.0–0.4)
Alpha-2-Globulin: 0.7 g/dL (ref 0.4–1.0)
Beta Globulin: 1 g/dL (ref 0.7–1.3)
Gamma Globulin: 0.8 g/dL (ref 0.4–1.8)
Globulin, Total: 2.7 g/dL (ref 2.2–3.9)
Total Protein ELP: 6.3 g/dL (ref 6.0–8.5)

## 2021-10-30 LAB — KAPPA/LAMBDA LIGHT CHAINS
Kappa free light chain: 20.5 mg/L — ABNORMAL HIGH (ref 3.3–19.4)
Kappa, lambda light chain ratio: 2.44 — ABNORMAL HIGH (ref 0.26–1.65)
Lambda free light chains: 8.4 mg/L (ref 5.7–26.3)

## 2021-11-18 ENCOUNTER — Other Ambulatory Visit: Payer: Self-pay | Admitting: Hematology

## 2021-11-18 ENCOUNTER — Other Ambulatory Visit: Payer: Self-pay

## 2021-11-18 NOTE — Telephone Encounter (Signed)
Chart reviewed. Revlimid refilled per last office note with Dr. Katragadda.  

## 2021-11-19 ENCOUNTER — Other Ambulatory Visit (HOSPITAL_COMMUNITY): Payer: Self-pay

## 2021-11-20 ENCOUNTER — Other Ambulatory Visit (HOSPITAL_COMMUNITY): Payer: Self-pay

## 2021-11-25 ENCOUNTER — Inpatient Hospital Stay: Payer: 59 | Attending: Hematology

## 2021-11-25 ENCOUNTER — Inpatient Hospital Stay: Payer: 59

## 2021-11-25 VITALS — BP 125/74 | HR 52 | Temp 97.8°F | Resp 16 | Wt 221.6 lb

## 2021-11-25 DIAGNOSIS — C9000 Multiple myeloma not having achieved remission: Secondary | ICD-10-CM

## 2021-11-25 DIAGNOSIS — Z5112 Encounter for antineoplastic immunotherapy: Secondary | ICD-10-CM | POA: Diagnosis present

## 2021-11-25 LAB — COMPREHENSIVE METABOLIC PANEL
ALT: 32 U/L (ref 0–44)
AST: 35 U/L (ref 15–41)
Albumin: 3.8 g/dL (ref 3.5–5.0)
Alkaline Phosphatase: 57 U/L (ref 38–126)
Anion gap: 8 (ref 5–15)
BUN: 13 mg/dL (ref 8–23)
CO2: 26 mmol/L (ref 22–32)
Calcium: 8.9 mg/dL (ref 8.9–10.3)
Chloride: 105 mmol/L (ref 98–111)
Creatinine, Ser: 1.02 mg/dL (ref 0.61–1.24)
GFR, Estimated: >60 mL/min (ref >60)
Glucose, Bld: 117 mg/dL — ABNORMAL HIGH (ref 70–99)
Potassium: 3.6 mmol/L (ref 3.5–5.1)
Sodium: 139 mmol/L (ref 135–145)
Total Bilirubin: 0.6 mg/dL (ref 0.3–1.2)
Total Protein: 6.7 g/dL (ref 6.5–8.1)

## 2021-11-25 LAB — CBC WITH DIFFERENTIAL/PLATELET
Abs Immature Granulocytes: 0.01 10*3/uL (ref 0.00–0.07)
Basophils Absolute: 0 10*3/uL (ref 0.0–0.1)
Basophils Relative: 1 %
Eosinophils Absolute: 0.1 10*3/uL (ref 0.0–0.5)
Eosinophils Relative: 3 %
HCT: 38 % — ABNORMAL LOW (ref 39.0–52.0)
Hemoglobin: 13 g/dL (ref 13.0–17.0)
Immature Granulocytes: 0 %
Lymphocytes Relative: 54 %
Lymphs Abs: 2.2 10*3/uL (ref 0.7–4.0)
MCH: 33.1 pg (ref 26.0–34.0)
MCHC: 34.2 g/dL (ref 30.0–36.0)
MCV: 96.7 fL (ref 80.0–100.0)
Monocytes Absolute: 0.5 10*3/uL (ref 0.1–1.0)
Monocytes Relative: 12 %
Neutro Abs: 1.2 10*3/uL — ABNORMAL LOW (ref 1.7–7.7)
Neutrophils Relative %: 30 %
Platelets: 212 10*3/uL (ref 150–400)
RBC: 3.93 MIL/uL — ABNORMAL LOW (ref 4.22–5.81)
RDW: 13.3 % (ref 11.5–15.5)
WBC: 4 10*3/uL (ref 4.0–10.5)
nRBC: 0 % (ref 0.0–0.2)

## 2021-11-25 LAB — MAGNESIUM: Magnesium: 1.7 mg/dL (ref 1.7–2.4)

## 2021-11-25 MED ORDER — DIPHENHYDRAMINE HCL 25 MG PO CAPS
25.0000 mg | ORAL_CAPSULE | Freq: Once | ORAL | Status: AC
  Administered 2021-11-25: 25 mg via ORAL
  Filled 2021-11-25: qty 1

## 2021-11-25 MED ORDER — ZOLEDRONIC ACID 4 MG/100ML IV SOLN
4.0000 mg | Freq: Once | INTRAVENOUS | Status: AC
  Administered 2021-11-25: 4 mg via INTRAVENOUS
  Filled 2021-11-25: qty 100

## 2021-11-25 MED ORDER — SODIUM CHLORIDE 0.9 % IV SOLN: Freq: Once | INTRAVENOUS | Status: AC

## 2021-11-25 MED ORDER — DARATUMUMAB-HYALURONIDASE-FIHJ 1800-30000 MG-UT/15ML ~~LOC~~ SOLN
1800.0000 mg | Freq: Once | SUBCUTANEOUS | Status: AC
  Administered 2021-11-25: 1800 mg via SUBCUTANEOUS
  Filled 2021-11-25: qty 15

## 2021-11-25 MED ORDER — DEXAMETHASONE 4 MG PO TABS
20.0000 mg | ORAL_TABLET | Freq: Once | ORAL | Status: AC
  Administered 2021-11-25: 20 mg via ORAL
  Filled 2021-11-25: qty 5

## 2021-11-25 NOTE — Progress Notes (Signed)
Patient presents today for Dartumumab injection and Zometa infusion.  Patient is in satisfactory condition with no complaints voiced.  Vital signs are stable.  Labs reviewed.  ANC today is 1.2.  MD made aware.  All other labs are within treatment parameters.  We will proceed with treatment per MD orders.   Patient tolerated Zometa infusion and Daratumumab injection well with no complaints voiced.  Patient left ambulatory in stable condition.  Vital signs stable at discharge.  Follow up as scheduled.

## 2021-11-25 NOTE — Patient Instructions (Signed)
[facility]  Discharge Instructions: Thank you for choosing [facility] to provide your oncology and hematology care.  If you have a lab appointment with the [facility], please come in thru the Main Entrance and check in at the main information desk.  Wear comfortable clothing and clothing appropriate for easy access to any Portacath or PICC line.   We strive to give you quality time with your provider. You may need to reschedule your appointment if you arrive late (15 or more minutes).  Arriving late affects you and other patients whose appointments are after yours.  Also, if you miss three or more appointments without notifying the office, you may be dismissed from the clinic at the provider's discretion.      For prescription refill requests, have your pharmacy contact our office and allow 72 hours for refills to be completed.    Today you received the following chemotherapy and/or immunotherapy agents Daratumumab/Zometa.  Zoledronic Acid Injection (Cancer) What is this medication? ZOLEDRONIC ACID (ZOE le dron ik AS id) treats high calcium levels in the blood caused by cancer. It may also be used with chemotherapy to treat weakened bones caused by cancer. It works by slowing down the release of calcium from bones. This lowers calcium levels in your blood. It also makes your bones stronger and less likely to break (fracture). It belongs to a group of medications called bisphosphonates. This medicine may be used for other purposes; ask your health care provider or pharmacist if you have questions. COMMON BRAND NAME(S): Zometa, Zometa Powder What should I tell my care team before I take this medication? They need to know if you have any of these conditions: Dehydration Dental disease Kidney disease Liver disease Low levels of calcium in the blood Lung or breathing disease, such as asthma Receiving steroids, such as dexamethasone or prednisone An  unusual or allergic reaction to zoledronic acid, other medications, foods, dyes, or preservatives Pregnant or trying to get pregnant Breast-feeding How should I use this medication? This medication is injected into a vein. It is given by your care team in a hospital or clinic setting. Talk to your care team about the use of this medication in children. Special care may be needed. Overdosage: If you think you have taken too much of this medicine contact a poison control center or emergency room at once. NOTE: This medicine is only for you. Do not share this medicine with others. What if I miss a dose? Keep appointments for follow-up doses. It is important not to miss your dose. Call your care team if you are unable to keep an appointment. What may interact with this medication? Certain antibiotics given by injection Diuretics, such as bumetanide, furosemide NSAIDs, medications for pain and inflammation, such as ibuprofen or naproxen Teriparatide Thalidomide This list may not describe all possible interactions. Give your health care provider a list of all the medicines, herbs, non-prescription drugs, or dietary supplements you use. Also tell them if you smoke, drink alcohol, or use illegal drugs. Some items may interact with your medicine. What should I watch for while using this medication? Visit your care team for regular checks on your progress. It may be some time before you see the benefit from this medication. Some people who take this medication have severe bone, joint, or muscle pain. This medication may also increase your risk for jaw problems or a broken thigh bone. Tell your care team right away if you have severe  pain in your jaw, bones, joints, or muscles. Tell you care team if you have any pain that does not go away or that gets worse. Tell your dentist and dental surgeon that you are taking this medication. You should not have major dental surgery while on this medication. See your  dentist to have a dental exam and fix any dental problems before starting this medication. Take good care of your teeth while on this medication. Make sure you see your dentist for regular follow-up appointments. You should make sure you get enough calcium and vitamin D while you are taking this medication. Discuss the foods you eat and the vitamins you take with your care team. Check with your care team if you have severe diarrhea, nausea, and vomiting, or if you sweat a lot. The loss of too much body fluid may make it dangerous for you to take this medication. You may need bloodwork while taking this medication. Talk to your care team if you wish to become pregnant or think you might be pregnant. This medication can cause serious birth defects. What side effects may I notice from receiving this medication? Side effects that you should report to your care team as soon as possible: Allergic reactions--skin rash, itching, hives, swelling of the face, lips, tongue, or throat Kidney injury--decrease in the amount of urine, swelling of the ankles, hands, or feet Low calcium level--muscle pain or cramps, confusion, tingling, or numbness in the hands or feet Osteonecrosis of the jaw--pain, swelling, or redness in the mouth, numbness of the jaw, poor healing after dental work, unusual discharge from the mouth, visible bones in the mouth Severe bone, joint, or muscle pain Side effects that usually do not require medical attention (report to your care team if they continue or are bothersome): Constipation Fatigue Fever Loss of appetite Nausea Stomach pain This list may not describe all possible side effects. Call your doctor for medical advice about side effects. You may report side effects to FDA at 1-800-FDA-1088. Where should I keep my medication? This medication is given in a hospital or clinic. It will not be stored at home. NOTE: This sheet is a summary. It may not cover all possible information.  If you have questions about this medicine, talk to your doctor, pharmacist, or health care provider.  2023 Elsevier/Gold Standard (2021-02-13 00:00:00)    Daratumumab Injection What is this medication? DARATUMUMAB (dar a toom ue mab) treats multiple myeloma, a type of bone marrow cancer. It works by helping your immune system slow or stop the spread of cancer cells. It is a monoclonal antibody. This medicine may be used for other purposes; ask your health care provider or pharmacist if you have questions. COMMON BRAND NAME(S): DARZALEX What should I tell my care team before I take this medication? They need to know if you have any of these conditions: Hereditary fructose intolerance Infection, such as chickenpox, herpes, hepatitis B virus Lung or breathing disease, such as asthma, COPD An unusual or allergic reaction to daratumumab, sorbitol, other medications, foods, dyes, or preservatives Pregnant or trying to get pregnant Breast-feeding How should I use this medication? This medication is injected into a vein. It is given by your care team in a hospital or clinic setting. Talk to your care team about the use of this medication in children. Special care may be needed. Overdosage: If you think you have taken too much of this medicine contact a poison control center or emergency room at once. NOTE: This medicine  is only for you. Do not share this medicine with others. What if I miss a dose? Keep appointments for follow-up doses. It is important not to miss your dose. Call your care team if you are unable to keep an appointment. What may interact with this medication? Interactions have not been studied. This list may not describe all possible interactions. Give your health care provider a list of all the medicines, herbs, non-prescription drugs, or dietary supplements you use. Also tell them if you smoke, drink alcohol, or use illegal drugs. Some items may interact with your  medicine. What should I watch for while using this medication? Your condition will be monitored carefully while you are receiving this medication. This medication can cause serious allergic reactions. To reduce your risk, your care team may give you other medication to take before receiving this one. Be sure to follow the directions from your care team. This medication can affect the results of blood tests to match your blood type. These changes can last for up to 6 months after the final dose. Your care team will do blood tests to match your blood type before you start treatment. Tell all of your care team that you are being treated with this medication before receiving a blood transfusion. This medication can affect the results of some tests used to determine treatment response; extra tests may be needed to evaluate response. Talk to your care team if you wish to become pregnant or think you are pregnant. This medication can cause serious birth defects if taken during pregnancy and for 3 months after the last dose. A reliable form of contraception is recommended while taking this medication and for 3 months after the last dose. Talk to your care team about effective forms of contraception. Do not breast-feed while taking this medication. What side effects may I notice from receiving this medication? Side effects that you should report to your care team as soon as possible: Allergic reactions--skin rash, itching, hives, swelling of the face, lips, tongue, or throat Infection--fever, chills, cough, sore throat, wounds that don't heal, pain or trouble when passing urine, general feeling of discomfort or being unwell Infusion reactions--chest pain, shortness of breath or trouble breathing, feeling faint or lightheaded Unusual bruising or bleeding Side effects that usually do not require medical attention (report to your care team if they continue or are  bothersome): Constipation Diarrhea Fatigue Nausea Pain, tingling, or numbness in the hands or feet Swelling of the ankles, hands, or feet This list may not describe all possible side effects. Call your doctor for medical advice about side effects. You may report side effects to FDA at 1-800-FDA-1088. Where should I keep my medication? This medication is given in a hospital or clinic. It will not be stored at home. NOTE: This sheet is a summary. It may not cover all possible information. If you have questions about this medicine, talk to your doctor, pharmacist, or health care provider.  2023 Elsevier/Gold Standard (2021-04-23 00:00:00)        To help prevent nausea and vomiting after your treatment, we encourage you to take your nausea medication as directed.  BELOW ARE SYMPTOMS THAT SHOULD BE REPORTED IMMEDIATELY: *FEVER GREATER THAN 100.4 F (38 C) OR HIGHER *CHILLS OR SWEATING *NAUSEA AND VOMITING THAT IS NOT CONTROLLED WITH YOUR NAUSEA MEDICATION *UNUSUAL SHORTNESS OF BREATH *UNUSUAL BRUISING OR BLEEDING *URINARY PROBLEMS (pain or burning when urinating, or frequent urination) *BOWEL PROBLEMS (unusual diarrhea, constipation, pain near the anus) TENDERNESS IN MOUTH  AND THROAT WITH OR WITHOUT PRESENCE OF ULCERS (sore throat, sores in mouth, or a toothache) UNUSUAL RASH, SWELLING OR PAIN  UNUSUAL VAGINAL DISCHARGE OR ITCHING   Items with * indicate a potential emergency and should be followed up as soon as possible or go to the Emergency Department if any problems should occur.  Please show the CHEMOTHERAPY ALERT CARD or IMMUNOTHERAPY ALERT CARD at check-in to the Emergency Department and triage nurse.  Should you have questions after your visit or need to cancel or reschedule your appointment, please contact [facility] [phone]  and follow the prompts.  Office hours are 8:00 a.m. to 4:30 p.m. Monday - Friday. Please note that voicemails left after 4:00  p.m. may not be returned until the following business day.  We are closed weekends and major holidays. You have access to a nurse at all times for urgent questions. Please call the main number to the clinic [phone] and follow the prompts.  For any non-urgent questions, you may also contact your provider using MyChart. We now offer e-Visits for anyone 16 and older to request care online for non-urgent symptoms. For details visit [email or web address].   Also download the MyChart app! Go to the app store, search "MyChart", open the app, select [facility], and log in with your MyChart username and password.  Masks are optional in the cancer centers. If you would like for your care team to wear a mask while they are taking care of you, please let them know. You may have one support person who is at least 64 years old accompany you for your appointments.

## 2021-11-26 ENCOUNTER — Encounter: Payer: 59 | Admitting: Internal Medicine

## 2021-11-29 ENCOUNTER — Other Ambulatory Visit: Payer: Self-pay

## 2021-12-11 ENCOUNTER — Other Ambulatory Visit (HOSPITAL_COMMUNITY): Payer: Self-pay

## 2021-12-11 ENCOUNTER — Ambulatory Visit (INDEPENDENT_AMBULATORY_CARE_PROVIDER_SITE_OTHER): Payer: 59 | Admitting: Internal Medicine

## 2021-12-11 ENCOUNTER — Other Ambulatory Visit: Payer: Self-pay

## 2021-12-11 ENCOUNTER — Encounter: Payer: Self-pay | Admitting: Internal Medicine

## 2021-12-11 VITALS — BP 136/72 | HR 73 | Ht 69.0 in | Wt 218.4 lb

## 2021-12-11 DIAGNOSIS — J3089 Other allergic rhinitis: Secondary | ICD-10-CM | POA: Diagnosis not present

## 2021-12-11 DIAGNOSIS — Z0001 Encounter for general adult medical examination with abnormal findings: Secondary | ICD-10-CM

## 2021-12-11 DIAGNOSIS — I2510 Atherosclerotic heart disease of native coronary artery without angina pectoris: Secondary | ICD-10-CM

## 2021-12-11 DIAGNOSIS — J309 Allergic rhinitis, unspecified: Secondary | ICD-10-CM | POA: Insufficient documentation

## 2021-12-11 DIAGNOSIS — Z23 Encounter for immunization: Secondary | ICD-10-CM

## 2021-12-11 DIAGNOSIS — E782 Mixed hyperlipidemia: Secondary | ICD-10-CM | POA: Diagnosis not present

## 2021-12-11 DIAGNOSIS — I10 Essential (primary) hypertension: Secondary | ICD-10-CM

## 2021-12-11 MED ORDER — ROSUVASTATIN CALCIUM 40 MG PO TABS
40.0000 mg | ORAL_TABLET | ORAL | 1 refills | Status: DC
  Filled 2021-12-11: qty 45, 90d supply, fill #0

## 2021-12-11 MED ORDER — NOREL AD 4-10-325 MG PO TABS: 1.0000 | ORAL_TABLET | Freq: Three times a day (TID) | ORAL | 0 refills | Status: DC | PRN

## 2021-12-11 NOTE — Patient Instructions (Addendum)
Please take Norel for nasal congestion. Okay to use Nasal saline spray for nasal congestion.  Please continue taking medications as prescribed.  Please continue to follow low salt diet and perform moderate exercise/walking at least 150 mins/week.

## 2021-12-11 NOTE — Assessment & Plan Note (Signed)
Has had cardiac cath in the past, no stents On Aspirin, statin and B-blocker Counseled again to stay compliant to statin therapy F/u with Cardiology

## 2021-12-11 NOTE — Assessment & Plan Note (Signed)
Physical exam as documented. Recent blood tests reviewed from chart. Tdap vaccine today, denies Shingrix vaccine for now.

## 2021-12-11 NOTE — Progress Notes (Signed)
Established Patient Office Visit  Subjective:  Patient ID: Eugene [name], male    DOB: 1955-02-19  Age: 66 y.o. MRN: [id number]  CC:  Chief Complaint  Patient presents with   Annual Exam    CPE cough congestion since 12/09/21    HPI Eugene [name] is a 66 y.o. male with past medical history of CAD, HTN, multiple myeloma and obesity who presents for annual physical.  HTN: BP is well-controlled. Takes medications regularly. Patient denies headache, dizziness, chest pain, dyspnea or palpitations.   CAD: Takes Aspirin and Metoprolol. Does not take Crestor regularly, advised to take it at least QOD.   MM: Followed by Oncology, undergoing chemotherapy.  He complains of nasal congestion and cough for the last 3 days.  Denies any fever or chills.  Denies any dyspnea or wheezing.  He has tried taking Robitussin DM with some relief.    Past Medical History:  Diagnosis Date   Back pain    CAD (coronary artery disease)    a. 01/2017 NSTEMI/Cath: LM nl, LAD 25p - ? hypodense but no filling defect, RI nl, LCX nl, RCA nl, EF 55-65%-->Med Rx.   Erectile dysfunction    History of echocardiogram    a. 01/2017 Echo: EF 55-60%, no rwma.   Hypertension     Past Surgical History:  Procedure Laterality Date   LEFT HEART CATH AND CORONARY ANGIOGRAPHY N/A 01/15/2017   Procedure: LEFT HEART CATH AND CORONARY ANGIOGRAPHY;  Surgeon: Martinique, Peter M, MD;  Location: [facility];  Service: Cardiovascular;  Laterality: N/A;   ORIF trocanteric femoral IM Nail Right     Family History  Problem Relation Age of Onset   Cancer Mother        breast   Diabetes Mother    Diabetes Sister    Hypertension Sister    Cancer Brother    Mental illness Sister     Social History   Socioeconomic History   Marital status: Significant Other    Spouse name: Not on file   Number of children: Not on file   Years of education: Not on file   Highest education level: Not on file  Occupational History   Not  on file  Tobacco Use   Smoking status: Never   Smokeless tobacco: Never  Vaping Use   Vaping Use: Never used  Substance and Sexual Activity   Alcohol use: No   Drug use: No   Sexual activity: Yes  Other Topics Concern   Not on file  Social History Narrative   Not on file   Social Determinants of Health   Financial Resource Strain: Low Risk  (06/26/2020)   Overall Financial Resource Strain (CARDIA)    Difficulty of Paying Living Expenses: Not hard at all  Food Insecurity: No Food Insecurity (06/26/2020)   Hunger Vital Sign    Worried About Running Out of Food in the Last Year: Never true    [place] in the Last Year: Never true  Transportation Needs: No Transportation Needs (06/26/2020)   PRAPARE - Hydrologist (Medical): No    Lack of Transportation (Non-Medical): No  Physical Activity: Insufficiently Active (06/26/2020)   Exercise Vital Sign    Days of Exercise per Week: 2 days    Minutes of Exercise per Session: 10 min  Stress: No Stress Concern Present (06/26/2020)   [place]    Feeling of Stress :  Not at all  Social Connections: Moderately Integrated (06/26/2020)   Social Connection and Isolation Panel [NHANES]    Frequency of Communication with Friends and Family: More than three times a week    Frequency of Social Gatherings with Friends and Family: More than three times a week    Attends Religious Services: 1 to 4 times per year    Active Member of [employer] or Organizations: No    Attends Archivist Meetings: 1 to 4 times per year    Marital Status: Divorced  Human resources officer Violence: Not At Risk (06/26/2020)   Humiliation, Afraid, Rape, and Kick questionnaire    Fear of Current or Ex-Partner: No    Emotionally Abused: No    Physically Abused: No    Sexually Abused: No    Outpatient Medications Prior to Visit  Medication Sig Dispense Refill   acyclovir  (ZOVIRAX) 400 MG tablet Take 1 tablet (400 mg total) by mouth 2 (two) times daily. 60 tablet 5   calcium-vitamin D (OSCAL WITH D) 500-200 MG-UNIT tablet Take 1 tablet by mouth.     dexamethasone (DECADRON) 4 MG tablet Take 10 tablets (40 mg total) by mouth once a week. 40 tablet 6   filgrastim-aafi (NIVESTYM) 480 MCG/0.8ML SOSY injection Inject 2 - 480 mcg syringes under the skin daily for 7 days to equal a total daily dose of 960 mcg 11.2 mL 0   lenalidomide (REVLIMID) 10 MG capsule TAKE 1 CAPSULE BY MOUTH 1 TIME A DAY FOR 21 DAYS ON THEN 7 DAYS OFF 21 capsule 0   metoprolol succinate (TOPROL-XL) 25 MG 24 hr tablet Take 1 tablet (25 mg total) by mouth daily. 90 tablet 3   Multiple Vitamin (MULTIVITAMIN) tablet Take 1 tablet by mouth daily.       Multiple Vitamins/Iron TABS Take by mouth.     nitroGLYCERIN (NITROSTAT) 0.4 MG SL tablet Place 1 tablet (0.4 mg total) under the tongue every 5 (five) minutes for 3 doses as needed for chest pain. 25 tablet 3   traMADol (ULTRAM) 50 MG tablet Take 1 tablet (50 mg total) by mouth every 6 (six) hours as needed. 60 tablet 0   rosuvastatin (CRESTOR) 40 MG tablet Take 1 tablet (40 mg total) by mouth every other day. 90 tablet 1   No facility-administered medications prior to visit.    No Known Allergies  ROS Review of Systems  Constitutional:  Positive for fatigue. Negative for chills and fever.  HENT:  Positive for congestion and postnasal drip. Negative for sore throat.   Eyes:  Negative for pain and discharge.  Respiratory:  Negative for cough and shortness of breath.   Cardiovascular:  Negative for chest pain and palpitations.  Gastrointestinal:  Negative for constipation, diarrhea, nausea and vomiting.  Endocrine: Negative for polydipsia and polyuria.  Genitourinary:  Negative for dysuria and hematuria.  Musculoskeletal:  Positive for arthralgias. Negative for neck pain and neck stiffness.  Skin:  Negative for rash.  Neurological:  Negative for  dizziness, weakness, numbness and headaches.  Psychiatric/Behavioral:  Negative for agitation and behavioral problems.       Objective:    Physical Exam Vitals reviewed.  Constitutional:      General: He is not in acute distress.    Appearance: He is obese. He is not diaphoretic.  HENT:     Head: Normocephalic and atraumatic.     Nose: Nose normal.     Mouth/Throat:     Mouth: Mucous membranes are moist.  Eyes:     General: No scleral icterus.    Extraocular Movements: Extraocular movements intact.  Cardiovascular:     Rate and Rhythm: Normal rate and regular rhythm.     Pulses: Normal pulses.     Heart sounds: Normal heart sounds. No murmur heard. Pulmonary:     Breath sounds: Normal breath sounds. No wheezing or rales.  Musculoskeletal:     Cervical back: Neck supple. No tenderness.     Right lower leg: No edema.     Left lower leg: No edema.  Skin:    General: Skin is warm.     Findings: No rash.  Neurological:     General: No focal deficit present.     Mental Status: He is alert and oriented to person, place, and time.     Sensory: No sensory deficit.     Motor: No weakness.  Psychiatric:        Mood and Affect: Mood normal.        Behavior: Behavior normal.     BP 136/72 (BP Location: Right Arm, Cuff Size: Normal)   Pulse 73   Ht _0  (1.753 m)   Wt 218 lb 6.4 oz (99.1 kg)   SpO2 97%   BMI 32.25 kg/m  Wt Readings from Last 3 Encounters:  12/11/21 218 lb 6.4 oz (99.1 kg)  11/25/21 221 lb 9.6 oz (100.5 kg)  10/07/21 216 lb (98 kg)    Lab Results  Component Value Date   TSH 2.140 07/16/2020   Lab Results  Component Value Date   WBC 4.0 11/25/2021   HGB 13.0 11/25/2021   HCT 38.0 (L) 11/25/2021   MCV 96.7 11/25/2021   PLT 212 11/25/2021   Lab Results  Component Value Date   NA 139 11/25/2021   K 3.6 11/25/2021   CO2 26 11/25/2021   GLUCOSE 117 (H) 11/25/2021   BUN 13 11/25/2021   CREATININE 1.02 11/25/2021   BILITOT 0.6 11/25/2021    ALKPHOS 57 11/25/2021   AST 35 11/25/2021   ALT 32 11/25/2021   PROT 6.7 11/25/2021   ALBUMIN 3.8 11/25/2021   CALCIUM 8.9 11/25/2021   ANIONGAP 8 11/25/2021   Lab Results  Component Value Date   CHOL 159 06/10/2021   Lab Results  Component Value Date   HDL 49 06/10/2021   Lab Results  Component Value Date   LDLCALC 92 06/10/2021   Lab Results  Component Value Date   TRIG 90 06/10/2021   Lab Results  Component Value Date   CHOLHDL 3.2 06/10/2021   Lab Results  Component Value Date   HGBA1C 4.9 06/10/2021      Assessment & Plan:   Problem List Items Addressed This Visit       Cardiovascular and Mediastinum   CAD (coronary artery disease)    Has had cardiac cath in the past, no stents On Aspirin, statin and B-blocker Counseled again to stay compliant to statin therapy F/u with Cardiology      Relevant Medications   rosuvastatin (CRESTOR) 40 MG tablet   Essential hypertension    BP Readings from Last 1 Encounters:  12/11/21 136/72  overall well-controlled with Metoprolol Counseled for compliance with the medications Advised DASH diet and moderate exercise/walking as tolerated      Relevant Medications   rosuvastatin (CRESTOR) 40 MG tablet     Respiratory   Allergic rhinitis    Nasal congestion and cough likely due to allergic rhinitis and/or recent cold weather  Norel as needed for nasal congestion Nasal saline spray as needed Advised to use humidifier and/or vaporizer      Relevant Medications   Chlorphen-PE-Acetaminophen (NOREL AD) 4-10-325 MG TABS   Other Relevant Orders   Novel Coronavirus, NAA ([facility])     Other   Mixed hyperlipidemia    Needs to take Crestor, at least QOD Check lipid profile      Relevant Medications   rosuvastatin (CRESTOR) 40 MG tablet   Encounter for general adult medical examination with abnormal findings - Primary    Physical exam as documented. Recent blood tests reviewed from chart. Tdap vaccine today,  denies Shingrix vaccine for now.      Other Visit Diagnoses     Immunization due       Relevant Orders   Tdap vaccine greater than or equal to 7yo IM (Completed)       Meds ordered this encounter  Medications   Chlorphen-PE-Acetaminophen (NOREL AD) 4-10-325 MG TABS    Sig: Take 1 each by mouth 3 (three) times daily as needed (Nasal congestion).    Dispense:  30 tablet    Refill:  0   rosuvastatin (CRESTOR) 40 MG tablet    Sig: Take 1 tablet (40 mg total) by mouth every other day.    Dispense:  90 tablet    Refill:  1    Follow-up: Return in about 6 months (around 06/11/2022) for CAD and HTN.    [name], MD

## 2021-12-11 NOTE — Assessment & Plan Note (Signed)
Needs to take Crestor, at least QOD Check lipid profile

## 2021-12-11 NOTE — Assessment & Plan Note (Signed)
BP Readings from Last 1 Encounters:  12/11/21 136/72   overall well-controlled with Metoprolol Counseled for compliance with the medications Advised DASH diet and moderate exercise/walking as tolerated

## 2021-12-11 NOTE — Assessment & Plan Note (Addendum)
Nasal congestion and cough likely due to allergic rhinitis and/or recent cold weather Norel as needed for nasal congestion Nasal saline spray as needed Advised to use humidifier and/or vaporizer

## 2021-12-11 NOTE — Addendum Note (Signed)
Addended byIhor Dow on: 12/11/2021 12:04 PM   Modules accepted: Level of Service

## 2021-12-12 ENCOUNTER — Other Ambulatory Visit: Payer: Self-pay

## 2021-12-13 LAB — NOVEL CORONAVIRUS, NAA: SARS-CoV-2, NAA: NOT DETECTED

## 2021-12-16 ENCOUNTER — Other Ambulatory Visit (HOSPITAL_COMMUNITY): Payer: Self-pay

## 2021-12-16 ENCOUNTER — Other Ambulatory Visit: Payer: Self-pay | Admitting: Hematology

## 2021-12-16 ENCOUNTER — Inpatient Hospital Stay: Payer: 59 | Attending: Hematology

## 2021-12-16 DIAGNOSIS — I10 Essential (primary) hypertension: Secondary | ICD-10-CM | POA: Diagnosis not present

## 2021-12-16 DIAGNOSIS — C9000 Multiple myeloma not having achieved remission: Secondary | ICD-10-CM | POA: Diagnosis present

## 2021-12-16 DIAGNOSIS — Z5112 Encounter for antineoplastic immunotherapy: Secondary | ICD-10-CM | POA: Insufficient documentation

## 2021-12-16 LAB — CBC WITH DIFFERENTIAL/PLATELET
Abs Immature Granulocytes: 0.01 10*3/uL (ref 0.00–0.07)
Basophils Absolute: 0 10*3/uL (ref 0.0–0.1)
Basophils Relative: 1 %
Eosinophils Absolute: 0.3 10*3/uL (ref 0.0–0.5)
Eosinophils Relative: 7 %
HCT: 39.2 % (ref 39.0–52.0)
Hemoglobin: 13.1 g/dL (ref 13.0–17.0)
Immature Granulocytes: 0 %
Lymphocytes Relative: 42 %
Lymphs Abs: 1.7 10*3/uL (ref 0.7–4.0)
MCH: 32.2 pg (ref 26.0–34.0)
MCHC: 33.4 g/dL (ref 30.0–36.0)
MCV: 96.3 fL (ref 80.0–100.0)
Monocytes Absolute: 0.5 10*3/uL (ref 0.1–1.0)
Monocytes Relative: 12 %
Neutro Abs: 1.5 10*3/uL — ABNORMAL LOW (ref 1.7–7.7)
Neutrophils Relative %: 38 %
Platelets: 194 10*3/uL (ref 150–400)
RBC: 4.07 MIL/uL — ABNORMAL LOW (ref 4.22–5.81)
RDW: 13.1 % (ref 11.5–15.5)
WBC: 4 10*3/uL (ref 4.0–10.5)
nRBC: 0 % (ref 0.0–0.2)

## 2021-12-16 LAB — COMPREHENSIVE METABOLIC PANEL
ALT: 31 U/L (ref 0–44)
AST: 27 U/L (ref 15–41)
Albumin: 3.7 g/dL (ref 3.5–5.0)
Alkaline Phosphatase: 54 U/L (ref 38–126)
Anion gap: 10 (ref 5–15)
BUN: 11 mg/dL (ref 8–23)
CO2: 26 mmol/L (ref 22–32)
Calcium: 8.6 mg/dL — ABNORMAL LOW (ref 8.9–10.3)
Chloride: 103 mmol/L (ref 98–111)
Creatinine, Ser: 0.98 mg/dL (ref 0.61–1.24)
GFR, Estimated: >60 mL/min (ref >60)
Glucose, Bld: 104 mg/dL — ABNORMAL HIGH (ref 70–99)
Potassium: 3.6 mmol/L (ref 3.5–5.1)
Sodium: 139 mmol/L (ref 135–145)
Total Bilirubin: 0.8 mg/dL (ref 0.3–1.2)
Total Protein: 6.6 g/dL (ref 6.5–8.1)

## 2021-12-16 LAB — MAGNESIUM: Magnesium: 1.8 mg/dL (ref 1.7–2.4)

## 2021-12-16 LAB — LACTATE DEHYDROGENASE: LDH: 114 U/L (ref 98–192)

## 2021-12-16 NOTE — Telephone Encounter (Signed)
Chart reviewed. Revlimid refilled per last office note with Dr. Katragadda.  

## 2021-12-17 LAB — KAPPA/LAMBDA LIGHT CHAINS
Kappa free light chain: 27.5 mg/L — ABNORMAL HIGH (ref 3.3–19.4)
Kappa, lambda light chain ratio: 1.88 — ABNORMAL HIGH (ref 0.26–1.65)
Lambda free light chains: 14.6 mg/L (ref 5.7–26.3)

## 2021-12-18 ENCOUNTER — Other Ambulatory Visit: Payer: Self-pay | Admitting: Hematology

## 2021-12-18 LAB — PROTEIN ELECTROPHORESIS, SERUM
A/G Ratio: 1.3 (ref 0.7–1.7)
Albumin ELP: 3.3 g/dL (ref 2.9–4.4)
Alpha-1-Globulin: 0.3 g/dL (ref 0.0–0.4)
Alpha-2-Globulin: 0.6 g/dL (ref 0.4–1.0)
Beta Globulin: 0.9 g/dL (ref 0.7–1.3)
Gamma Globulin: 0.8 g/dL (ref 0.4–1.8)
Globulin, Total: 2.6 g/dL (ref 2.2–3.9)
Total Protein ELP: 5.9 g/dL — ABNORMAL LOW (ref 6.0–8.5)

## 2021-12-18 MED ORDER — DEXAMETHASONE 4 MG PO TABS: 40.0000 mg | ORAL_TABLET | ORAL | 6 refills | Status: DC

## 2021-12-19 ENCOUNTER — Other Ambulatory Visit (HOSPITAL_COMMUNITY): Payer: Self-pay

## 2021-12-19 NOTE — Progress Notes (Signed)
After discussion with the provider, the patient will begin taking their premedications at home prior to daratumumab infusions. Premedications to be taken at home include Dexamethasone, starting 12/23/2021. Patient was contacted and voiced understanding. A calendar has been mailed to the patient's address.  Laray Anger, PharmD PGY-2 Pharmacy Resident Hematology/Oncology [phone]  12/19/2021 10:22 AM

## 2021-12-22 ENCOUNTER — Encounter (HOSPITAL_COMMUNITY): Payer: Self-pay | Admitting: Hematology

## 2021-12-22 ENCOUNTER — Encounter: Payer: Self-pay | Admitting: Hematology

## 2021-12-22 MED ORDER — DEXAMETHASONE 4 MG PO TABS: 40.0000 mg | ORAL_TABLET | ORAL | 6 refills | Status: DC

## 2021-12-23 ENCOUNTER — Inpatient Hospital Stay: Payer: 59

## 2021-12-23 ENCOUNTER — Inpatient Hospital Stay (HOSPITAL_BASED_OUTPATIENT_CLINIC_OR_DEPARTMENT_OTHER): Payer: 59 | Admitting: Hematology

## 2021-12-23 DIAGNOSIS — C9000 Multiple myeloma not having achieved remission: Secondary | ICD-10-CM

## 2021-12-23 DIAGNOSIS — Z5112 Encounter for antineoplastic immunotherapy: Secondary | ICD-10-CM | POA: Diagnosis not present

## 2021-12-23 MED ORDER — DARATUMUMAB-HYALURONIDASE-FIHJ 1800-30000 MG-UT/15ML ~~LOC~~ SOLN
1800.0000 mg | Freq: Once | SUBCUTANEOUS | Status: AC
  Administered 2021-12-23: 1800 mg via SUBCUTANEOUS
  Filled 2021-12-23: qty 15

## 2021-12-23 MED ORDER — SODIUM CHLORIDE 0.9 % IV SOLN: Freq: Once | INTRAVENOUS | Status: AC

## 2021-12-23 MED ORDER — DIPHENHYDRAMINE HCL 25 MG PO CAPS
25.0000 mg | ORAL_CAPSULE | Freq: Once | ORAL | Status: AC
  Administered 2021-12-23: 25 mg via ORAL
  Filled 2021-12-23: qty 1

## 2021-12-23 MED ORDER — ZOLEDRONIC ACID 4 MG/100ML IV SOLN
4.0000 mg | Freq: Once | INTRAVENOUS | Status: AC
  Administered 2021-12-23: 4 mg via INTRAVENOUS
  Filled 2021-12-23: qty 100

## 2021-12-23 NOTE — Progress Notes (Signed)
Eugene [name], [place] [id number]   CLINIC:  Medical Oncology/Hematology  PCP:  [name], MD [address] / [place] [address] [phone]   REASON FOR VISIT:  Follow-up for multiple myeloma  PRIOR THERAPY: none  NGS Results: not done  CURRENT THERAPY: DaraVRd every 3 weeks x 6 cycles  BRIEF ONCOLOGIC HISTORY:  Oncology History  Multiple myeloma without remission ([place])  07/09/2020 Pathology Results   BONE MARROW, ASPIRATE, CLOT, CORE:  -Hypercellular bone marrow with plasma cell neoplasm  -See comment   PERIPHERAL BLOOD:  -Slight macrocytic anemia   COMMENT:   The bone marrow is hypercellular for age with trilineage hematopoiesis and nonspecific myeloid changes.  In this background, the plasma cells are increased in number representing 10% of all cells associated with atypical cytomorphologic features.  The plasma cells display kappa light chain restriction consistent with plasma cell neoplasm.  Correlation with cytogenetic and FISH studies is recommended.    07/24/2020 Initial Diagnosis   Multiple myeloma without remission ([place])   07/24/2020 Cancer Staging   Staging form: Plasma Cell Myeloma and Plasma Cell Disorders, AJCC 8th Edition - Clinical stage from 07/24/2020: RISS Stage I (Beta-2-microglobulin (mg/L): 2.1, Albumin (g/dL): 3.7, ISS: Stage I, High-risk cytogenetics: Absent, LDH: Normal) - Signed by Heath Lark, MD on 07/24/2020 Stage prefix: Initial diagnosis Beta 2 microglobulin range (mg/L): Less than 3.5 Albumin range (g/dL): Greater than or equal to 3.5 Cytogenetics: No abnormalities   09/03/2020 - 09/02/2021 Chemotherapy   Patient is on Treatment Plan : MYELOMA NEWLY DIAGNOSED TRANSPLANT CANDIDATE DaraVRd (Daratumumab SQ) q21d x 6 Cycles (Induction/Consolidation)     04/16/2021 -  Chemotherapy   Patient is on Treatment Plan : MYELOMA Daratumumab SQ q28d       CANCER STAGING:  Cancer Staging  Multiple myeloma  without remission ([place]) Staging form: Plasma Cell Myeloma and Plasma Cell Disorders, AJCC 8th Edition - Clinical stage from 07/24/2020: RISS Stage I (Beta-2-microglobulin (mg/L): 2.1, Albumin (g/dL): 3.7, ISS: Stage I, High-risk cytogenetics: Absent, LDH: Normal) - Signed by Heath Lark, MD on 07/24/2020   INTERVAL HISTORY:  Eugene [name], a 66 y.o. male, seen for follow-up of multiple myeloma.  He is receiving Revlimid and Darzalex.  Energy levels are 75%.  He has some cough from recent sinus problem.  Denies any GI side effects from Revlimid.  Denies any tingling or numbness in the extremities.   REVIEW OF SYSTEMS:  Review of Systems  Respiratory:  Positive for cough.   All other systems reviewed and are negative.   PAST MEDICAL/SURGICAL HISTORY:  Past Medical History:  Diagnosis Date   Back pain    CAD (coronary artery disease)    a. 01/2017 NSTEMI/Cath: LM nl, LAD 25p - ? hypodense but no filling defect, RI nl, LCX nl, RCA nl, EF 55-65%-->Med Rx.   Erectile dysfunction    History of echocardiogram    a. 01/2017 Echo: EF 55-60%, no rwma.   Hypertension    Past Surgical History:  Procedure Laterality Date   LEFT HEART CATH AND CORONARY ANGIOGRAPHY N/A 01/15/2017   Procedure: LEFT HEART CATH AND CORONARY ANGIOGRAPHY;  Surgeon: Martinique, Peter M, MD;  Location: [facility];  Service: Cardiovascular;  Laterality: N/A;   ORIF trocanteric femoral IM Nail Right     SOCIAL HISTORY:  Social History   Socioeconomic History   Marital status: Significant Other    Spouse name: Not on file   Number of  children: Not on file   Years of education: Not on file   Highest education level: Not on file  Occupational History   Not on file  Tobacco Use   Smoking status: Never   Smokeless tobacco: Never  Vaping Use   Vaping Use: Never used  Substance and Sexual Activity   Alcohol use: No   Drug use: No   Sexual activity: Yes  Other Topics Concern   Not on file  Social History  Narrative   Not on file   Social Determinants of Health   Financial Resource Strain: Low Risk  (06/26/2020)   Overall Financial Resource Strain (CARDIA)    Difficulty of Paying Living Expenses: Not hard at all  Food Insecurity: No Food Insecurity (06/26/2020)   Hunger Vital Sign    Worried About Running Out of Food in the Last Year: Never true    Ran Out of Food in the Last Year: Never true  Transportation Needs: No Transportation Needs (06/26/2020)   PRAPARE - Hydrologist (Medical): No    Lack of Transportation (Non-Medical): No  Physical Activity: Insufficiently Active (06/26/2020)   Exercise Vital Sign    Days of Exercise per Week: 2 days    Minutes of Exercise per Session: 10 min  Stress: No Stress Concern Present (06/26/2020)   [place]    Feeling of Stress : Not at all  Social Connections: Moderately Integrated (06/26/2020)   Social Connection and Isolation Panel [NHANES]    Frequency of Communication with Friends and Family: More than three times a week    Frequency of Social Gatherings with Friends and Family: More than three times a week    Attends Religious Services: 1 to 4 times per year    Active Member of [employer] or Organizations: No    Attends Music therapist: 1 to 4 times per year    Marital Status: Divorced  Human resources officer Violence: Not At Risk (06/26/2020)   Humiliation, Afraid, Rape, and Kick questionnaire    Fear of Current or Ex-Partner: No    Emotionally Abused: No    Physically Abused: No    Sexually Abused: No    FAMILY HISTORY:  Family History  Problem Relation Age of Onset   Cancer Mother        breast   Diabetes Mother    Diabetes Sister    Hypertension Sister    Cancer Brother    Mental illness Sister     CURRENT MEDICATIONS:  Current Outpatient Medications  Medication Sig Dispense Refill   acyclovir (ZOVIRAX) 400 MG tablet Take 1  tablet (400 mg total) by mouth 2 (two) times daily. 60 tablet 5   calcium-vitamin D (OSCAL WITH D) 500-200 MG-UNIT tablet Take 1 tablet by mouth.     Chlorphen-PE-Acetaminophen (NOREL AD) 4-10-325 MG TABS Take 1 each by mouth 3 (three) times daily as needed (Nasal congestion). 30 tablet 0   dexamethasone (DECADRON) 4 MG tablet Take 10 tablets (40 mg total) by mouth once a week. 40 tablet 6   filgrastim-aafi (NIVESTYM) 480 MCG/0.8ML SOSY injection Inject 2 - 480 mcg syringes under the skin daily for 7 days to equal a total daily dose of 960 mcg 11.2 mL 0   lenalidomide (REVLIMID) 10 MG capsule TAKE 1 CAPSULE BY MOUTH 1 TIME A DAY FOR 21 DAYS ON THEN 7 DAYS OFF 21 capsule 0   metoprolol succinate (TOPROL-XL) 25 MG  24 hr tablet Take 1 tablet (25 mg total) by mouth daily. 90 tablet 3   Multiple Vitamin (MULTIVITAMIN) tablet Take 1 tablet by mouth daily.       Multiple Vitamins/Iron TABS Take by mouth.     nitroGLYCERIN (NITROSTAT) 0.4 MG SL tablet Place 1 tablet (0.4 mg total) under the tongue every 5 (five) minutes for 3 doses as needed for chest pain. 25 tablet 3   rosuvastatin (CRESTOR) 40 MG tablet Take 1 tablet (40 mg total) by mouth every other day. 90 tablet 1   traMADol (ULTRAM) 50 MG tablet Take 1 tablet (50 mg total) by mouth every 6 (six) hours as needed. 60 tablet 0   No current facility-administered medications for this visit.    ALLERGIES:  No Known Allergies  PHYSICAL EXAM:  Performance status (ECOG): 1 - Symptomatic but completely ambulatory  Vitals:   12/23/21 1259  BP: (!) 142/85  Pulse: 62  Resp: 16  Temp: 98.4 F (36.9 C)  SpO2: 100%   Wt Readings from Last 3 Encounters:  12/23/21 216 lb 11.2 oz (98.3 kg)  12/11/21 218 lb 6.4 oz (99.1 kg)  11/25/21 221 lb 9.6 oz (100.5 kg)   Physical Exam Vitals reviewed.  Constitutional:      Appearance: Normal appearance.  HENT:     Mouth/Throat:     Mouth: No oral lesions.     Palate: No mass.  Cardiovascular:     Rate  and Rhythm: Normal rate and regular rhythm.     Pulses: Normal pulses.     Heart sounds: Normal heart sounds.  Pulmonary:     Effort: Pulmonary effort is normal.     Breath sounds: Normal breath sounds.  Musculoskeletal:     Right lower leg: No edema.     Left lower leg: No edema.  Neurological:     General: No focal deficit present.     Mental Status: He is alert and oriented to person, place, and time.  Psychiatric:        Mood and Affect: Mood normal.        Behavior: Behavior normal.    LABORATORY DATA:  I have reviewed the labs as listed.     Latest Ref Rng & Units 12/16/2021   11:05 AM 11/25/2021   11:57 AM 10/28/2021    1:06 PM  CBC  WBC 4.0 - 10.5 K/uL 4.0  4.0  4.7   Hemoglobin 13.0 - 17.0 g/dL 13.1  13.0  12.9   Hematocrit 39.0 - 52.0 % 39.2  38.0  38.6   Platelets 150 - 400 K/uL 194  212  215       Latest Ref Rng & Units 12/16/2021   11:05 AM 11/25/2021   11:57 AM 10/28/2021    1:06 PM  CMP  Glucose 70 - 99 mg/dL 104  117  74   BUN 8 - 23 mg/dL _0 Creatinine 0.61 - 1.24 mg/dL 0.98  1.02  1.06   Sodium 135 - 145 mmol/L 139  139  139   Potassium 3.5 - 5.1 mmol/L 3.6  3.6  3.5   Chloride 98 - 111 mmol/L 103  105  105   CO2 22 - 32 mmol/L _1 Calcium 8.9 - 10.3 mg/dL 8.6  8.9  8.7   Total Protein 6.5 - 8.1 g/dL 6.6  6.7  6.9   Total Bilirubin 0.3 - 1.2 mg/dL 0.8  0.6  0.6   Alkaline Phos 38 - 126 U/L 54  57  64   AST 15 - 41 U/L 27  35  28   ALT 0 - 44 U/L 31  32  27     DIAGNOSTIC IMAGING:  I have independently reviewed the scans and discussed with the patient. No results found.   ASSESSMENT:  1.  Standard risk plasma cell myeloma: - He reported low back pain radiating to the right thigh since April, pain gets worse when he walks for more than 2 blocks. - He had history of MGUS and was last seen in our clinic in 2019.  He had an abnormal M spike of 3.2 g on 12/06/2018. - Skeletal survey on 10/22/2017 showed no suspicious lytic  lesions. - X-ray of the right sided pelvis on 06/11/2020 showed prominent lytic lesions noted in the proximal midportion of the right femoral diaphysis with no evidence of fracture or dislocation. - Bone marrow biopsy on 07/09/2020 with hypercellular marrow with trilineage hematopoiesis.  Plasma cells representing 10% of cells. - Chromosome analysis 46, XY (20).  Multiple myeloma FISH panel negative. - PET scan on 07/04/2020 with multiple hypermetabolic bony soft tissue lesions.  Dominant lesions in the L5 vertebral body and posterior right acetabulum and distal right femur.  Focal increased uptake in the right tonsillar region with associated soft tissue fullness on CT imaging. - 24-hour urine with total protein 134. - Beta-2 microglobulin 2.1 (06/26/2020), LDH 136. - Dara RVD cycle 1 started on 09/03/2020. - He had stem cell [facility] at [facility].  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 - Maintenance daratumumab monthly and Revlimid 10 mg 3 weeks on/1 week off started on 04/08/2021. - We reviewed PET scan (07/03/2021): Lytic soft tissue lesion in the right L5 vertebral body measures 3.7 x 2.8 cm SUV 2.9, previously 3.4 x 3.4 cm, SUV 6.6.  Soft tissue lesion in the posterior right acetabulum measures 2.2 cm SUV 2.5, previously 2.2 cm with SUV 9.  Hypermetabolism in the left tonsil SUV 5.9.  Direct oropharyngeal exam did not show any mass.  New mildly hypermetabolic left lower pulmonary nodules nonspecific.  2.  Social/family history: - He works at [employer] in environmental services. - He was never smoker. - 2 brothers died of metastatic lung cancer.  Sister has thyroid cancer.  Mother had breast cancer.   PLAN:  1.  Stage I standard risk IgG kappa multiple myeloma: - Labs from 01/10/2022: M spike is negative.  Free light chain ratio is 1.8 with kappa light chains 27.5. - LFTs are normal.  Creatinine is 0.98 and CBC was normal. - Continue Revlimid 10 mg 3 weeks on/1 week off. -  Continue Darzalex monthly.  Will send Decadron to his pharmacy.  He will take 10 tablets on the day of Darzalex injection. - RTC 3 months for follow-up with repeat myeloma labs.  Will also check daratumumab immunofixation prior to next visit.  2.  Left mid thigh lesion: - He does not report any left mid thigh pain.  3.  Infection prophylaxis: - Continue acyclovir twice daily.  Continue aspirin 81 mg daily.  4.  Myeloma bone disease: - Continue Zometa monthly.  No dental issues reported.   Orders placed this encounter:  No orders of the defined types were placed in this encounter.    Eugene Jack, MD [place] [phone]

## 2021-12-23 NOTE — Progress Notes (Signed)
Labs reviewed with MD today. Ok to treat and ok for zometa infusion today as well per MD.   Patient is taking his steroid medication at home  Treatment given per orders. Patient tolerated it well without problems. Vitals stable and discharged home from clinic ambulatory. Follow up as scheduled.

## 2021-12-23 NOTE — Patient Instructions (Signed)
[facility]  Discharge Instructions: Thank you for choosing [facility] to provide your oncology and hematology care.  If you have a lab appointment with the [facility], please come in thru the Main Entrance and check in at the main information desk.  Wear comfortable clothing and clothing appropriate for easy access to any Portacath or PICC line.   We strive to give you quality time with your provider. You may need to reschedule your appointment if you arrive late (15 or more minutes).  Arriving late affects you and other patients whose appointments are after yours.  Also, if you miss three or more appointments without notifying the office, you may be dismissed from the clinic at the provider's discretion.      For prescription refill requests, have your pharmacy contact our office and allow 72 hours for refills to be completed.    Today you received the following chemotherapy, darzalex injection, and zometa infusion      To help prevent nausea and vomiting after your treatment, we encourage you to take your nausea medication as directed.  BELOW ARE SYMPTOMS THAT SHOULD BE REPORTED IMMEDIATELY: *FEVER GREATER THAN 100.4 F (38 C) OR HIGHER *CHILLS OR SWEATING *NAUSEA AND VOMITING THAT IS NOT CONTROLLED WITH YOUR NAUSEA MEDICATION *UNUSUAL SHORTNESS OF BREATH *UNUSUAL BRUISING OR BLEEDING *URINARY PROBLEMS (pain or burning when urinating, or frequent urination) *BOWEL PROBLEMS (unusual diarrhea, constipation, pain near the anus) TENDERNESS IN MOUTH AND THROAT WITH OR WITHOUT PRESENCE OF ULCERS (sore throat, sores in mouth, or a toothache) UNUSUAL RASH, SWELLING OR PAIN  UNUSUAL VAGINAL DISCHARGE OR ITCHING   Items with * indicate a potential emergency and should be followed up as soon as possible or go to the Emergency Department if any problems should occur.  Please show the CHEMOTHERAPY ALERT CARD or IMMUNOTHERAPY ALERT CARD at check-in to the  Emergency Department and triage nurse.  Should you have questions after your visit or need to cancel or reschedule your appointment, please contact [facility] [phone]  and follow the prompts.  Office hours are 8:00 a.m. to 4:30 p.m. Monday - Friday. Please note that voicemails left after 4:00 p.m. may not be returned until the following business day.  We are closed weekends and major holidays. You have access to a nurse at all times for urgent questions. Please call the main number to the clinic [phone] and follow the prompts.  For any non-urgent questions, you may also contact your provider using MyChart. We now offer e-Visits for anyone 84 and older to request care online for non-urgent symptoms. For details visit [email or web address].   Also download the MyChart app! Go to the app store, search "MyChart", open the app, select [facility], and log in with your MyChart username and password.  Masks are optional in the cancer centers. If you would like for your care team to wear a mask while they are taking care of you, please let them know. You may have one support person who is at least 64 years old accompany you for your appointments.

## 2021-12-23 NOTE — Patient Instructions (Addendum)
[facility] at [facility] Discharge Instructions   You were seen and examined today by Dr. Delton Coombes.  He reviewed the results of your lab work from last week which are normal/stable.   We will proceed with your treatment today.  Continue Revlimid as prescribed.   Return as scheduled.    Thank you for choosing [facility] at [facility] to provide your oncology and hematology care.  To afford each patient quality time with our provider, please arrive at least 15 minutes before your scheduled appointment time.   If you have a lab appointment with the [facility] please come in thru the Main Entrance and check in at the main information desk.  You need to re-schedule your appointment should you arrive 10 or more minutes late.  We strive to give you quality time with our providers, and arriving late affects you and other patients whose appointments are after yours.  Also, if you no show three or more times for appointments you may be dismissed from the clinic at the providers discretion.     Again, thank you for choosing [facility].  Our hope is that these requests will decrease the amount of time that you wait before being seen by our physicians.       _____________________________________________________________  Should you have questions after your visit to [facility], please contact our office at [phone] and follow the prompts.  Our office hours are 8:00 a.m. and 4:30 p.m. Monday - Friday.  Please note that voicemails left after 4:00 p.m. may not be returned until the following business day.  We are closed weekends and major holidays.  You do have access to a nurse 24-7, just call the main number to the clinic [phone] and do not press any options, hold on the line and a nurse will answer the phone.    For prescription refill requests, have your pharmacy contact our office and allow 72 hours.    Due to  Covid, you will need to wear a mask upon entering the hospital. If you do not have a mask, a mask will be given to you at the Main Entrance upon arrival. For doctor visits, patients may have 1 support person age 43 or older with them. For treatment visits, patients can not have anyone with them due to social distancing guidelines and our immunocompromised population.

## 2021-12-23 NOTE — Progress Notes (Signed)
Patient is taking Revlimid as prescribed.  He has not missed any doses and reports no side effects at this time.    Patient has been examined by Dr. Delton Coombes, and vital signs and labs have been reviewed. ANC, Creatinine, LFTs, hemoglobin, and platelets are within treatment parameters per M.D. - pt may proceed with treatment.  Primary RN and pharmacy notified.

## 2021-12-25 ENCOUNTER — Encounter: Payer: Self-pay | Admitting: Hematology

## 2021-12-25 ENCOUNTER — Encounter (HOSPITAL_COMMUNITY): Payer: Self-pay | Admitting: Hematology

## 2021-12-27 ENCOUNTER — Encounter: Payer: Self-pay | Admitting: Hematology

## 2021-12-27 ENCOUNTER — Encounter (HOSPITAL_COMMUNITY): Payer: Self-pay | Admitting: Hematology

## 2022-01-06 ENCOUNTER — Other Ambulatory Visit: Payer: Self-pay | Admitting: *Deleted

## 2022-01-06 DIAGNOSIS — C9000 Multiple myeloma not having achieved remission: Secondary | ICD-10-CM

## 2022-01-09 ENCOUNTER — Other Ambulatory Visit: Payer: Self-pay | Admitting: Hematology

## 2022-01-09 ENCOUNTER — Other Ambulatory Visit: Payer: Self-pay

## 2022-01-09 MED ORDER — LENALIDOMIDE 10 MG PO CAPS: ORAL_CAPSULE | ORAL | 0 refills | Status: DC

## 2022-01-09 NOTE — Telephone Encounter (Signed)
Chart reviewed. Revlimid refilled per last office note with Dr. Katragadda.  

## 2022-01-13 ENCOUNTER — Other Ambulatory Visit: Payer: Self-pay | Admitting: Hematology

## 2022-01-13 ENCOUNTER — Other Ambulatory Visit: Payer: Self-pay

## 2022-01-13 MED ORDER — LENALIDOMIDE 10 MG PO CAPS: ORAL_CAPSULE | ORAL | 0 refills | Status: DC

## 2022-01-15 ENCOUNTER — Other Ambulatory Visit: Payer: Self-pay | Admitting: *Deleted

## 2022-01-15 ENCOUNTER — Other Ambulatory Visit: Payer: Self-pay | Admitting: Hematology

## 2022-01-15 MED ORDER — LENALIDOMIDE 10 MG PO CAPS: ORAL_CAPSULE | ORAL | 0 refills | Status: DC

## 2022-01-19 ENCOUNTER — Encounter (HOSPITAL_COMMUNITY): Payer: Self-pay | Admitting: Hematology

## 2022-01-19 ENCOUNTER — Other Ambulatory Visit: Payer: Self-pay

## 2022-01-19 ENCOUNTER — Encounter: Payer: Self-pay | Admitting: Hematology

## 2022-01-20 ENCOUNTER — Inpatient Hospital Stay: Payer: Self-pay

## 2022-01-20 ENCOUNTER — Inpatient Hospital Stay: Payer: Self-pay | Attending: Hematology

## 2022-01-20 ENCOUNTER — Encounter: Payer: Self-pay | Admitting: Hematology

## 2022-01-20 ENCOUNTER — Encounter (HOSPITAL_COMMUNITY): Payer: Self-pay | Admitting: Hematology

## 2022-01-20 VITALS — BP 132/78 | HR 57 | Temp 96.0°F | Resp 18 | Wt 223.6 lb

## 2022-01-20 DIAGNOSIS — C9000 Multiple myeloma not having achieved remission: Secondary | ICD-10-CM | POA: Diagnosis present

## 2022-01-20 DIAGNOSIS — Z5112 Encounter for antineoplastic immunotherapy: Secondary | ICD-10-CM | POA: Insufficient documentation

## 2022-01-20 LAB — COMPREHENSIVE METABOLIC PANEL
ALT: 20 U/L (ref 0–44)
AST: 26 U/L (ref 15–41)
Albumin: 4.1 g/dL (ref 3.5–5.0)
Alkaline Phosphatase: 61 U/L (ref 38–126)
Anion gap: 8 (ref 5–15)
BUN: 15 mg/dL (ref 8–23)
CO2: 28 mmol/L (ref 22–32)
Calcium: 9.1 mg/dL (ref 8.9–10.3)
Chloride: 101 mmol/L (ref 98–111)
Creatinine, Ser: 0.99 mg/dL (ref 0.61–1.24)
GFR, Estimated: >60 mL/min (ref >60)
Glucose, Bld: 108 mg/dL — ABNORMAL HIGH (ref 70–99)
Potassium: 4.1 mmol/L (ref 3.5–5.1)
Sodium: 137 mmol/L (ref 135–145)
Total Bilirubin: 0.8 mg/dL (ref 0.3–1.2)
Total Protein: 7.1 g/dL (ref 6.5–8.1)

## 2022-01-20 LAB — CBC WITH DIFFERENTIAL/PLATELET
Abs Immature Granulocytes: 0 10*3/uL (ref 0.00–0.07)
Basophils Absolute: 0 10*3/uL (ref 0.0–0.1)
Basophils Relative: 1 %
Eosinophils Absolute: 0.1 10*3/uL (ref 0.0–0.5)
Eosinophils Relative: 2 %
HCT: 42.2 % (ref 39.0–52.0)
Hemoglobin: 13.7 g/dL (ref 13.0–17.0)
Immature Granulocytes: 0 %
Lymphocytes Relative: 40 %
Lymphs Abs: 1.5 10*3/uL (ref 0.7–4.0)
MCH: 32.2 pg (ref 26.0–34.0)
MCHC: 32.5 g/dL (ref 30.0–36.0)
MCV: 99.1 fL (ref 80.0–100.0)
Monocytes Absolute: 0.3 10*3/uL (ref 0.1–1.0)
Monocytes Relative: 9 %
Neutro Abs: 1.8 10*3/uL (ref 1.7–7.7)
Neutrophils Relative %: 48 %
Platelets: 203 10*3/uL (ref 150–400)
RBC: 4.26 MIL/uL (ref 4.22–5.81)
RDW: 13.6 % (ref 11.5–15.5)
WBC: 3.7 10*3/uL — ABNORMAL LOW (ref 4.0–10.5)
nRBC: 0 % (ref 0.0–0.2)

## 2022-01-20 LAB — MAGNESIUM: Magnesium: 1.7 mg/dL (ref 1.7–2.4)

## 2022-01-20 MED ORDER — LORATADINE 10 MG PO TABS
10.0000 mg | ORAL_TABLET | Freq: Once | ORAL | Status: AC
  Administered 2022-01-20: 10 mg via ORAL
  Filled 2022-01-20: qty 1

## 2022-01-20 MED ORDER — SODIUM CHLORIDE 0.9 % IV SOLN: Freq: Once | INTRAVENOUS | Status: AC

## 2022-01-20 MED ORDER — ZOLEDRONIC ACID 4 MG/100ML IV SOLN
4.0000 mg | Freq: Once | INTRAVENOUS | Status: AC
  Administered 2022-01-20: 4 mg via INTRAVENOUS
  Filled 2022-01-20: qty 100

## 2022-01-20 MED ORDER — DIPHENHYDRAMINE HCL 25 MG PO CAPS: 25.0000 mg | ORAL_CAPSULE | Freq: Once | ORAL | Status: DC

## 2022-01-20 MED ORDER — DARATUMUMAB-HYALURONIDASE-FIHJ 1800-30000 MG-UT/15ML ~~LOC~~ SOLN
1800.0000 mg | Freq: Once | SUBCUTANEOUS | Status: AC
  Administered 2022-01-20: 1800 mg via SUBCUTANEOUS
  Filled 2022-01-20: qty 15

## 2022-01-20 NOTE — Patient Instructions (Signed)
[facility]  Discharge Instructions: Thank you for choosing [facility] to provide your oncology and hematology care.  If you have a lab appointment with the [facility], please come in thru the Main Entrance and check in at the main information desk.  Wear comfortable clothing and clothing appropriate for easy access to any Portacath or PICC line.   We strive to give you quality time with your provider. You may need to reschedule your appointment if you arrive late (15 or more minutes).  Arriving late affects you and other patients whose appointments are after yours.  Also, if you miss three or more appointments without notifying the office, you may be dismissed from the clinic at the provider's discretion.      For prescription refill requests, have your pharmacy contact our office and allow 72 hours for refills to be completed.    Today you received the following chemotherapy and/or immunotherapy agents Dara  and Zometa '4mg'$  IV   To help prevent nausea and vomiting after your treatment, we encourage you to take your nausea medication as directed.  BELOW ARE SYMPTOMS THAT SHOULD BE REPORTED IMMEDIATELY: *FEVER GREATER THAN 100.4 F (38 C) OR HIGHER *CHILLS OR SWEATING *NAUSEA AND VOMITING THAT IS NOT CONTROLLED WITH YOUR NAUSEA MEDICATION *UNUSUAL SHORTNESS OF BREATH *UNUSUAL BRUISING OR BLEEDING *URINARY PROBLEMS (pain or burning when urinating, or frequent urination) *BOWEL PROBLEMS (unusual diarrhea, constipation, pain near the anus) TENDERNESS IN MOUTH AND THROAT WITH OR WITHOUT PRESENCE OF ULCERS (sore throat, sores in mouth, or a toothache) UNUSUAL RASH, SWELLING OR PAIN  UNUSUAL VAGINAL DISCHARGE OR ITCHING   Items with * indicate a potential emergency and should be followed up as soon as possible or go to the Emergency Department if any problems should occur.  Please show the CHEMOTHERAPY ALERT CARD or IMMUNOTHERAPY ALERT CARD at check-in  to the Emergency Department and triage nurse.  Should you have questions after your visit or need to cancel or reschedule your appointment, please contact [facility] [phone]  and follow the prompts.  Office hours are 8:00 a.m. to 4:30 p.m. Monday - Friday. Please note that voicemails left after 4:00 p.m. may not be returned until the following business day.  We are closed weekends and major holidays. You have access to a nurse at all times for urgent questions. Please call the main number to the clinic [phone] and follow the prompts.  For any non-urgent questions, you may also contact your provider using MyChart. We now offer e-Visits for anyone 41 and older to request care online for non-urgent symptoms. For details visit [email or web address].   Also download the MyChart app! Go to the app store, search "MyChart", open the app, select [facility], and log in with your MyChart username and password.

## 2022-01-20 NOTE — Progress Notes (Signed)
Pt presents today for Eugene [place] and Zometa 4 mg IV per provider's order. Vital signs and labs WNL for treatment. Okay to proceed with treatment today.  Eugene [name] and Zometa '4mg'$  IV given today per MD orders. Tolerated infusion without adverse affects. Vital signs stable. No complaints at this time. Discharged from clinic ambulatory in stable condition. Alert and oriented x 3. F/U with [facility] as scheduled.

## 2022-01-20 NOTE — Progress Notes (Signed)
Discontinue diphenhydramine from oncology treatment plan --> Add loratidine 10 mg orally x 1 as premedication for oncology treatment plan.  T.O. Dr Rhys Martini, PharmD

## 2022-01-21 ENCOUNTER — Encounter: Payer: Self-pay | Admitting: Hematology

## 2022-01-21 ENCOUNTER — Encounter (HOSPITAL_COMMUNITY): Payer: Self-pay | Admitting: Hematology

## 2022-01-21 ENCOUNTER — Other Ambulatory Visit (HOSPITAL_COMMUNITY): Payer: Self-pay

## 2022-01-23 ENCOUNTER — Encounter: Payer: Self-pay | Admitting: Hematology

## 2022-01-23 ENCOUNTER — Other Ambulatory Visit (HOSPITAL_COMMUNITY): Payer: Self-pay

## 2022-01-23 ENCOUNTER — Encounter (HOSPITAL_COMMUNITY): Payer: Self-pay | Admitting: Hematology

## 2022-01-26 LAB — MISC LABCORP TEST (SEND OUT): Labcorp test code: 123218

## 2022-01-30 ENCOUNTER — Telehealth: Payer: Self-pay

## 2022-01-30 ENCOUNTER — Other Ambulatory Visit (HOSPITAL_COMMUNITY): Payer: Self-pay

## 2022-01-30 NOTE — Telephone Encounter (Signed)
Oral Oncology Patient Advocate Encounter   Received notification that prior authorization for Lenalidomide is required.   PA submitted on 01/30/22  Key [id number]  Status is pending     Berdine Addison, [place] Patient [place]  [phone] (phone) [phone] (fax) 01/30/2022 11:11 AM

## 2022-01-30 NOTE — Telephone Encounter (Addendum)
Oral Oncology Patient Advocate Encounter  Prior Authorization for Lenalidomide has been approved.    PA# [id number]  Effective dates: 01/30/22 through 01/30/23  Patient fills at [facility] was informed and confirmed successful paid claim.  Berdine Addison, [facility] Patient [facility]  [phone] (phone) [phone] (fax) 01/30/2022 12:11 PM

## 2022-02-17 ENCOUNTER — Encounter (HOSPITAL_COMMUNITY): Payer: Self-pay | Admitting: Hematology

## 2022-02-17 ENCOUNTER — Inpatient Hospital Stay: Payer: Medicare Other | Attending: Hematology

## 2022-02-17 ENCOUNTER — Inpatient Hospital Stay: Payer: Medicare Other

## 2022-02-17 ENCOUNTER — Encounter: Payer: Self-pay | Admitting: Hematology

## 2022-02-17 VITALS — BP 137/71 | HR 65 | Temp 96.7°F | Resp 20 | Wt 229.2 lb

## 2022-02-17 DIAGNOSIS — Z5112 Encounter for antineoplastic immunotherapy: Secondary | ICD-10-CM | POA: Insufficient documentation

## 2022-02-17 DIAGNOSIS — C9000 Multiple myeloma not having achieved remission: Secondary | ICD-10-CM

## 2022-02-17 LAB — CBC WITH DIFFERENTIAL/PLATELET
Abs Immature Granulocytes: 0.03 10*3/uL (ref 0.00–0.07)
Basophils Absolute: 0 10*3/uL (ref 0.0–0.1)
Basophils Relative: 0 %
Eosinophils Absolute: 0.2 10*3/uL (ref 0.0–0.5)
Eosinophils Relative: 3 %
HCT: 40.9 % (ref 39.0–52.0)
Hemoglobin: 13.4 g/dL (ref 13.0–17.0)
Immature Granulocytes: 1 %
Lymphocytes Relative: 49 %
Lymphs Abs: 2.2 10*3/uL (ref 0.7–4.0)
MCH: 32.5 pg (ref 26.0–34.0)
MCHC: 32.8 g/dL (ref 30.0–36.0)
MCV: 99.3 fL (ref 80.0–100.0)
Monocytes Absolute: 0.7 10*3/uL (ref 0.1–1.0)
Monocytes Relative: 16 %
Neutro Abs: 1.4 10*3/uL — ABNORMAL LOW (ref 1.7–7.7)
Neutrophils Relative %: 31 %
Platelets: 181 10*3/uL (ref 150–400)
RBC: 4.12 MIL/uL — ABNORMAL LOW (ref 4.22–5.81)
RDW: 13.3 % (ref 11.5–15.5)
WBC: 4.4 10*3/uL (ref 4.0–10.5)
nRBC: 0 % (ref 0.0–0.2)

## 2022-02-17 LAB — COMPREHENSIVE METABOLIC PANEL
ALT: 25 U/L (ref 0–44)
AST: 29 U/L (ref 15–41)
Albumin: 3.9 g/dL (ref 3.5–5.0)
Alkaline Phosphatase: 63 U/L (ref 38–126)
Anion gap: 9 (ref 5–15)
BUN: 10 mg/dL (ref 8–23)
CO2: 29 mmol/L (ref 22–32)
Calcium: 8.8 mg/dL — ABNORMAL LOW (ref 8.9–10.3)
Chloride: 100 mmol/L (ref 98–111)
Creatinine, Ser: 0.98 mg/dL (ref 0.61–1.24)
GFR, Estimated: >60 mL/min (ref >60)
Glucose, Bld: 110 mg/dL — ABNORMAL HIGH (ref 70–99)
Potassium: 3.6 mmol/L (ref 3.5–5.1)
Sodium: 138 mmol/L (ref 135–145)
Total Bilirubin: 0.8 mg/dL (ref 0.3–1.2)
Total Protein: 6.7 g/dL (ref 6.5–8.1)

## 2022-02-17 LAB — MAGNESIUM: Magnesium: 1.6 mg/dL — ABNORMAL LOW (ref 1.7–2.4)

## 2022-02-17 LAB — LACTATE DEHYDROGENASE: LDH: 104 U/L (ref 98–192)

## 2022-02-17 MED ORDER — DARATUMUMAB-HYALURONIDASE-FIHJ 1800-30000 MG-UT/15ML ~~LOC~~ SOLN
1800.0000 mg | Freq: Once | SUBCUTANEOUS | Status: AC
  Administered 2022-02-17: 1800 mg via SUBCUTANEOUS
  Filled 2022-02-17: qty 15

## 2022-02-17 MED ORDER — SODIUM CHLORIDE 0.9 % IV SOLN: Freq: Once | INTRAVENOUS | Status: AC

## 2022-02-17 MED ORDER — LORATADINE 10 MG PO TABS: 10.0000 mg | ORAL_TABLET | Freq: Once | ORAL | Status: DC

## 2022-02-17 MED ORDER — ZOLEDRONIC ACID 4 MG/100ML IV SOLN
4.0000 mg | Freq: Once | INTRAVENOUS | Status: AC
  Administered 2022-02-17: 4 mg via INTRAVENOUS
  Filled 2022-02-17: qty 100

## 2022-02-17 MED ORDER — MAGNESIUM OXIDE 400 MG PO TABS
400.0000 mg | ORAL_TABLET | Freq: Once | ORAL | Status: AC
  Administered 2022-02-17: 400 mg via ORAL
  Filled 2022-02-17: qty 1

## 2022-02-17 MED ORDER — CETIRIZINE HCL 10 MG PO TABS
10.0000 mg | ORAL_TABLET | Freq: Once | ORAL | Status: AC
  Administered 2022-02-17: 10 mg via ORAL
  Filled 2022-02-17: qty 1

## 2022-02-17 NOTE — Patient Instructions (Signed)
[facility]  Discharge Instructions: Thank you for choosing [facility] to provide your oncology and hematology care.  If you have a lab appointment with the [facility], please come in thru the Main Entrance and check in at the main information desk.  Wear comfortable clothing and clothing appropriate for easy access to any Portacath or PICC line.   We strive to give you quality time with your provider. You may need to reschedule your appointment if you arrive late (15 or more minutes).  Arriving late affects you and other patients whose appointments are after yours.  Also, if you miss three or more appointments without notifying the office, you may be dismissed from the clinic at the provider's discretion.      For prescription refill requests, have your pharmacy contact our office and allow 72 hours for refills to be completed.    Today you received the following chemotherapy and/or immunotherapy agents Dara [place], magnesium oxide, and Zometa 4 mg IV    To help prevent nausea and vomiting after your treatment, we encourage you to take your nausea medication as directed.   Daratumumab Injection What is this medication? DARATUMUMAB (dar a toom ue mab) treats multiple myeloma, a type of bone marrow cancer. It works by helping your immune system slow or stop the spread of cancer cells. It is a monoclonal antibody. This medicine may be used for other purposes; ask your health care provider or pharmacist if you have questions. COMMON BRAND NAME(S): DARZALEX What should I tell my care team before I take this medication? They need to know if you have any of these conditions: Hereditary fructose intolerance Infection, such as chickenpox, herpes, hepatitis B virus Lung or breathing disease, such as asthma, COPD An unusual or allergic reaction to daratumumab, sorbitol, other medications, foods, dyes, or preservatives Pregnant or trying to get  pregnant Breast-feeding How should I use this medication? This medication is injected into a vein. It is given by your care team in a hospital or clinic setting. Talk to your care team about the use of this medication in children. Special care may be needed. Overdosage: If you think you have taken too much of this medicine contact a poison control center or emergency room at once. NOTE: This medicine is only for you. Do not share this medicine with others. What if I miss a dose? Keep appointments for follow-up doses. It is important not to miss your dose. Call your care team if you are unable to keep an appointment. What may interact with this medication? Interactions have not been studied. This list may not describe all possible interactions. Give your health care provider a list of all the medicines, herbs, non-prescription drugs, or dietary supplements you use. Also tell them if you smoke, drink alcohol, or use illegal drugs. Some items may interact with your medicine. What should I watch for while using this medication? Your condition will be monitored carefully while you are receiving this medication. This medication can cause serious allergic reactions. To reduce your risk, your care team may give you other medication to take before receiving this one. Be sure to follow the directions from your care team. This medication can affect the results of blood tests to match your blood type. These changes can last for up to 6 months after the final dose. Your care team will do blood tests to match your blood type before you start treatment. Tell all of your care team that  you are being treated with this medication before receiving a blood transfusion. This medication can affect the results of some tests used to determine treatment response; extra tests may be needed to evaluate response. Talk to your care team if you wish to become pregnant or think you are pregnant. This medication can cause serious  birth defects if taken during pregnancy and for 3 months after the last dose. A reliable form of contraception is recommended while taking this medication and for 3 months after the last dose. Talk to your care team about effective forms of contraception. Do not breast-feed while taking this medication. What side effects may I notice from receiving this medication? Side effects that you should report to your care team as soon as possible: Allergic reactions--skin rash, itching, hives, swelling of the face, lips, tongue, or throat Infection--fever, chills, cough, sore throat, wounds that don't heal, pain or trouble when passing urine, general feeling of discomfort or being unwell Infusion reactions--chest pain, shortness of breath or trouble breathing, feeling faint or lightheaded Unusual bruising or bleeding Side effects that usually do not require medical attention (report to your care team if they continue or are bothersome): Constipation Diarrhea Fatigue Nausea Pain, tingling, or numbness in the hands or feet Swelling of the ankles, hands, or feet This list may not describe all possible side effects. Call your doctor for medical advice about side effects. You may report side effects to FDA at 1-800-FDA-1088. Where should I keep my medication? This medication is given in a hospital or clinic. It will not be stored at home. NOTE: This sheet is a summary. It may not cover all possible information. If you have questions about this medicine, talk to your doctor, pharmacist, or health care provider.  2023 Elsevier/Gold Standard (2021-04-23 00:00:00)  BELOW ARE SYMPTOMS THAT SHOULD BE REPORTED IMMEDIATELY: *FEVER GREATER THAN 100.4 F (38 C) OR HIGHER *CHILLS OR SWEATING *NAUSEA AND VOMITING THAT IS NOT CONTROLLED WITH YOUR NAUSEA MEDICATION *UNUSUAL SHORTNESS OF BREATH *UNUSUAL BRUISING OR BLEEDING *URINARY PROBLEMS (pain or burning when urinating, or frequent urination) *BOWEL PROBLEMS  (unusual diarrhea, constipation, pain near the anus) TENDERNESS IN MOUTH AND THROAT WITH OR WITHOUT PRESENCE OF ULCERS (sore throat, sores in mouth, or a toothache) UNUSUAL RASH, SWELLING OR PAIN  UNUSUAL VAGINAL DISCHARGE OR ITCHING   Items with * indicate a potential emergency and should be followed up as soon as possible or go to the Emergency Department if any problems should occur.  Please show the CHEMOTHERAPY ALERT CARD or IMMUNOTHERAPY ALERT CARD at check-in to the Emergency Department and triage nurse.  Should you have questions after your visit or need to cancel or reschedule your appointment, please contact [facility] [phone]  and follow the prompts.  Office hours are 8:00 a.m. to 4:30 p.m. Monday - Friday. Please note that voicemails left after 4:00 p.m. may not be returned until the following business day.  We are closed weekends and major holidays. You have access to a nurse at all times for urgent questions. Please call the main number to the clinic [phone] and follow the prompts.  For any non-urgent questions, you may also contact your provider using MyChart. We now offer e-Visits for anyone 10 and older to request care online for non-urgent symptoms. For details visit [email or web address].   Also download the MyChart app! Go to the app store, search "MyChart", open the app, select , and log in with your MyChart username and password.

## 2022-02-17 NOTE — Progress Notes (Signed)
Pt presents today for Eugene [name], Zometa '4mg'$  IV per provider's order.Vital signs and other labs WNL for treatment. Pt's ANC is 1.4 and magnesium is 1.6. Dr.K made aware and stated to give 400 mg magnesium oxide p.o x 1 dose. Okay to proceed with treatment today per Dr.K.  Eugene [name], Zometa '4mg'$  IV, and 400 mg magnesium oxide p.o x 1 dose given today per MD orders. Tolerated infusion without adverse affects. Vital signs stable. No complaints at this time. Discharged from clinic ambulatory in stable condition. Alert and oriented x 3. F/U with [facility] as scheduled.

## 2022-02-18 LAB — KAPPA/LAMBDA LIGHT CHAINS
Kappa free light chain: 20.1 mg/L — ABNORMAL HIGH (ref 3.3–19.4)
Kappa, lambda light chain ratio: 2.14 — ABNORMAL HIGH (ref 0.26–1.65)
Lambda free light chains: 9.4 mg/L (ref 5.7–26.3)

## 2022-02-20 ENCOUNTER — Other Ambulatory Visit: Payer: Self-pay | Admitting: Hematology

## 2022-02-23 ENCOUNTER — Other Ambulatory Visit: Payer: Self-pay

## 2022-02-23 ENCOUNTER — Encounter: Payer: Self-pay | Admitting: Hematology

## 2022-02-23 ENCOUNTER — Encounter (HOSPITAL_COMMUNITY): Payer: Self-pay | Admitting: Hematology

## 2022-02-23 LAB — PROTEIN ELECTROPHORESIS, SERUM
A/G Ratio: 1.8 — ABNORMAL HIGH (ref 0.7–1.7)
Albumin ELP: 3.9 g/dL (ref 2.9–4.4)
Alpha-1-Globulin: 0.2 g/dL (ref 0.0–0.4)
Alpha-2-Globulin: 0.6 g/dL (ref 0.4–1.0)
Beta Globulin: 0.8 g/dL (ref 0.7–1.3)
Gamma Globulin: 0.6 g/dL (ref 0.4–1.8)
Globulin, Total: 2.2 g/dL (ref 2.2–3.9)
Total Protein ELP: 6.1 g/dL (ref 6.0–8.5)

## 2022-02-23 MED ORDER — LENALIDOMIDE 10 MG PO CAPS: ORAL_CAPSULE | ORAL | 0 refills | Status: DC

## 2022-02-23 NOTE — Telephone Encounter (Signed)
Chart reviewed. Revlimid refilled per last office note with Dr. Katragadda.  

## 2022-02-25 ENCOUNTER — Other Ambulatory Visit (HOSPITAL_COMMUNITY): Payer: Self-pay

## 2022-02-25 ENCOUNTER — Encounter (HOSPITAL_COMMUNITY): Payer: Self-pay | Admitting: Hematology

## 2022-02-25 ENCOUNTER — Encounter: Payer: Self-pay | Admitting: Hematology

## 2022-02-26 ENCOUNTER — Other Ambulatory Visit (HOSPITAL_COMMUNITY): Payer: Self-pay

## 2022-02-26 ENCOUNTER — Encounter: Payer: Self-pay | Admitting: Hematology

## 2022-02-26 ENCOUNTER — Encounter (HOSPITAL_COMMUNITY): Payer: Self-pay | Admitting: Hematology

## 2022-03-09 ENCOUNTER — Other Ambulatory Visit: Payer: Self-pay

## 2022-03-09 DIAGNOSIS — C9000 Multiple myeloma not having achieved remission: Secondary | ICD-10-CM

## 2022-03-10 ENCOUNTER — Inpatient Hospital Stay: Payer: Medicare Other

## 2022-03-10 DIAGNOSIS — C9000 Multiple myeloma not having achieved remission: Secondary | ICD-10-CM

## 2022-03-10 DIAGNOSIS — Z5112 Encounter for antineoplastic immunotherapy: Secondary | ICD-10-CM | POA: Diagnosis not present

## 2022-03-10 LAB — COMPREHENSIVE METABOLIC PANEL
ALT: 22 U/L (ref 0–44)
AST: 27 U/L (ref 15–41)
Albumin: 4 g/dL (ref 3.5–5.0)
Alkaline Phosphatase: 58 U/L (ref 38–126)
Anion gap: 10 (ref 5–15)
BUN: 11 mg/dL (ref 8–23)
CO2: 26 mmol/L (ref 22–32)
Calcium: 9 mg/dL (ref 8.9–10.3)
Chloride: 101 mmol/L (ref 98–111)
Creatinine, Ser: 1.02 mg/dL (ref 0.61–1.24)
GFR, Estimated: >60 mL/min (ref >60)
Glucose, Bld: 148 mg/dL — ABNORMAL HIGH (ref 70–99)
Potassium: 3.7 mmol/L (ref 3.5–5.1)
Sodium: 137 mmol/L (ref 135–145)
Total Bilirubin: 0.6 mg/dL (ref 0.3–1.2)
Total Protein: 6.8 g/dL (ref 6.5–8.1)

## 2022-03-10 LAB — CBC WITH DIFFERENTIAL/PLATELET
Abs Immature Granulocytes: 0.02 10*3/uL (ref 0.00–0.07)
Basophils Absolute: 0 10*3/uL (ref 0.0–0.1)
Basophils Relative: 1 %
Eosinophils Absolute: 0.1 10*3/uL (ref 0.0–0.5)
Eosinophils Relative: 4 %
HCT: 41.7 % (ref 39.0–52.0)
Hemoglobin: 13.9 g/dL (ref 13.0–17.0)
Immature Granulocytes: 1 %
Lymphocytes Relative: 50 %
Lymphs Abs: 1.7 10*3/uL (ref 0.7–4.0)
MCH: 32.9 pg (ref 26.0–34.0)
MCHC: 33.3 g/dL (ref 30.0–36.0)
MCV: 98.6 fL (ref 80.0–100.0)
Monocytes Absolute: 0.3 10*3/uL (ref 0.1–1.0)
Monocytes Relative: 10 %
Neutro Abs: 1.1 10*3/uL — ABNORMAL LOW (ref 1.7–7.7)
Neutrophils Relative %: 34 %
Platelets: 225 10*3/uL (ref 150–400)
RBC: 4.23 MIL/uL (ref 4.22–5.81)
RDW: 13.2 % (ref 11.5–15.5)
WBC: 3.3 10*3/uL — ABNORMAL LOW (ref 4.0–10.5)
nRBC: 0 % (ref 0.0–0.2)

## 2022-03-10 LAB — LACTATE DEHYDROGENASE: LDH: 106 U/L (ref 98–192)

## 2022-03-11 LAB — KAPPA/LAMBDA LIGHT CHAINS
Kappa free light chain: 18.7 mg/L (ref 3.3–19.4)
Kappa, lambda light chain ratio: 1.8 — ABNORMAL HIGH (ref 0.26–1.65)
Lambda free light chains: 10.4 mg/L (ref 5.7–26.3)

## 2022-03-12 ENCOUNTER — Encounter: Payer: Self-pay | Admitting: Radiology

## 2022-03-13 ENCOUNTER — Encounter (HOSPITAL_COMMUNITY): Payer: Self-pay | Admitting: Hematology

## 2022-03-13 ENCOUNTER — Encounter: Payer: Self-pay | Admitting: Hematology

## 2022-03-13 LAB — PROTEIN ELECTROPHORESIS, SERUM
A/G Ratio: 1.6 (ref 0.7–1.7)
Albumin ELP: 3.9 g/dL (ref 2.9–4.4)
Alpha-1-Globulin: 0.2 g/dL (ref 0.0–0.4)
Alpha-2-Globulin: 0.6 g/dL (ref 0.4–1.0)
Beta Globulin: 0.9 g/dL (ref 0.7–1.3)
Gamma Globulin: 0.7 g/dL (ref 0.4–1.8)
Globulin, Total: 2.4 g/dL (ref 2.2–3.9)
Total Protein ELP: 6.3 g/dL (ref 6.0–8.5)

## 2022-03-16 ENCOUNTER — Encounter: Payer: Self-pay | Admitting: Hematology

## 2022-03-16 ENCOUNTER — Encounter (HOSPITAL_COMMUNITY): Payer: Self-pay | Admitting: Hematology

## 2022-03-16 NOTE — Progress Notes (Signed)
[facility] [address], [place] [address]    Clinic Day:  03/17/2022  Referring physician: [name], MD  Patient Care Team: [name], MD as PCP - General (Internal Medicine) Harl Bowie Alphonse Guild, MD as PCP - Cardiology (Cardiology) Eloise Harman, DO as Consulting Physician (Internal Medicine) Brien Mates, RN as Oncology Nurse Navigator (Oncology) Derek Jack, MD as Medical Oncologist (Oncology)   ASSESSMENT & PLAN:   Assessment: 1.  Standard risk plasma cell myeloma: - He reported low back pain radiating to the right thigh since April, pain gets worse when he walks for more than 2 blocks. - He had history of MGUS and was last seen in our clinic in 2019.  He had an abnormal M spike of 3.2 g on 12/06/2018. - Skeletal survey on 10/22/2017 showed no suspicious lytic lesions. - X-ray of the right sided pelvis on 06/11/2020 showed prominent lytic lesions noted in the proximal midportion of the right femoral diaphysis with no evidence of fracture or dislocation. - Bone marrow biopsy on 07/09/2020 with hypercellular marrow with trilineage hematopoiesis.  Plasma cells representing 10% of cells. - Chromosome analysis 46, XY (20).  Multiple myeloma FISH panel negative. - PET scan on 07/04/2020 with multiple hypermetabolic bony soft tissue lesions.  Dominant lesions in the L5 vertebral body and posterior right acetabulum and distal right femur.  Focal increased uptake in the right tonsillar region with associated soft tissue fullness on CT imaging. - 24-hour urine with total protein 134. - Beta-2 microglobulin 2.1 (06/26/2020), LDH 136. - Dara RVD cycle 1 started on 09/03/2020. - He had stem cell collection at [facility].  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 - Maintenance daratumumab monthly and Revlimid 10 mg 3 weeks on/1 week off started on 04/08/2021. - We reviewed PET scan (07/03/2021): Lytic soft tissue lesion in the right L5  vertebral body measures 3.7 x 2.8 cm SUV 2.9, previously 3.4 x 3.4 cm, SUV 6.6.  Soft tissue lesion in the posterior right acetabulum measures 2.2 cm SUV 2.5, previously 2.2 cm with SUV 9.  Hypermetabolism in the left tonsil SUV 5.9.  Direct oropharyngeal exam did not show any mass.  New mildly hypermetabolic left lower pulmonary nodules nonspecific.  2.  Social/family history: - He works at [employer] in environmental services. - He was never smoker. - 2 brothers died of metastatic lung cancer.  Sister has thyroid cancer.  Mother had breast cancer.  Plan: 1.  Stage I standard risk IgG kappa multiple myeloma: -He is tolerating Revlimid and Darzalex very well. - Reviewed myeloma labs from 03/10/2022: M spike is undetectable.  Kappa light chains normalized at 18.7.  Ratio is slightly elevated at 1.80.  Daratumumab specific immunofixation is pending. - Continue Revlimid 10 mg 3 weeks on/1 week off. - Continue daratumumab monthly.  Continue to take dexamethasone 40 mg on the days of daratumumab. - RTC 3 months for follow-up with repeat Dara specific immunofixation and other myeloma labs.  If it continues to be negative, will consider discontinuing daratumumab.  2.  Left mid thigh lesion: -He does not report any pain in the left mid thigh region.  3.  Infection prophylaxis: -Continue acyclovir for shingles prophylaxis.  Continue aspirin for thromboprophylaxis.  4.  Myeloma bone disease: -Continue Zometa monthly.  No dental issues reported.  Orders Placed This Encounter  Procedures   CBC with Differential    Standing Status:   Future  Standing Expiration Date:   04/14/2023   CBC with Differential    Standing Status:   Future    Standing Expiration Date:   05/12/2023   CBC with Differential    Standing Status:   Future    Standing Expiration Date:   06/09/2023   CBC with Differential    Standing Status:   Future    Standing Expiration Date:   07/07/2023   CBC with Differential     Standing Status:   Future    Standing Expiration Date:   08/04/2023      I,Alexis Herring,acting as a scribe for Derek Jack, MD.,have documented all relevant documentation on the behalf of Derek Jack, MD,as directed by  Derek Jack, MD while in the presence of Derek Jack, MD.   I, Derek Jack MD, have reviewed the above documentation for accuracy and completeness, and I agree with the above.   Derek Jack, MD   3/5/20244:54 PM  CHIEF COMPLAINT:   Diagnosis: multiple myeloma    Cancer Staging  Multiple myeloma without remission ([place]) Staging form: Plasma Cell Myeloma and Plasma Cell Disorders, AJCC 8th Edition - Clinical stage from 07/24/2020: RISS Stage I (Beta-2-microglobulin (mg/L): 2.1, Albumin (g/dL): 3.7, ISS: Stage I, High-risk cytogenetics: Absent, LDH: Normal) - Signed by Heath Lark, MD on 07/24/2020    Prior Therapy: None  Current Therapy:  DaraVRd every 3 weeks x 6 cycles    HISTORY OF PRESENT ILLNESS:   Oncology History  Multiple myeloma without remission ([place])  07/09/2020 Pathology Results   BONE MARROW, ASPIRATE, CLOT, CORE:  -Hypercellular bone marrow with plasma cell neoplasm  -See comment   PERIPHERAL BLOOD:  -Slight macrocytic anemia   COMMENT:   The bone marrow is hypercellular for age with trilineage hematopoiesis and nonspecific myeloid changes.  In this background, the plasma cells are increased in number representing 10% of all cells associated with atypical cytomorphologic features.  The plasma cells display kappa light chain restriction consistent with plasma cell neoplasm.  Correlation with cytogenetic and FISH studies is recommended.    07/24/2020 Initial Diagnosis   Multiple myeloma without remission ([place])   07/24/2020 Cancer Staging   Staging form: Plasma Cell Myeloma and Plasma Cell Disorders, AJCC 8th Edition - Clinical stage from 07/24/2020: RISS Stage I (Beta-2-microglobulin (mg/L): 2.1,  Albumin (g/dL): 3.7, ISS: Stage I, High-risk cytogenetics: Absent, LDH: Normal) - Signed by Heath Lark, MD on 07/24/2020 Stage prefix: Initial diagnosis Beta 2 microglobulin range (mg/L): Less than 3.5 Albumin range (g/dL): Greater than or equal to 3.5 Cytogenetics: No abnormalities   09/03/2020 - 09/02/2021 Chemotherapy   Patient is on Treatment Plan : MYELOMA NEWLY DIAGNOSED TRANSPLANT CANDIDATE DaraVRd (Daratumumab SQ) q21d x 6 Cycles (Induction/Consolidation)     04/16/2021 -  Chemotherapy   Patient is on Treatment Plan : MYELOMA Daratumumab SQ q28d        INTERVAL HISTORY:   Harvey is a 67 y.o. male presenting to clinic today for follow up of multiple myeloma. He was last seen by me on 12/23/21.  Today, he states that he is doing well overall. His appetite level is at 100%. His energy level is at 75%. He denies any recent infections.  Denies any GI side effects.  No new onset pains reported.   PAST MEDICAL HISTORY:   Past Medical History: Past Medical History:  Diagnosis Date   Back pain    CAD (coronary artery disease)    a. 01/2017 NSTEMI/Cath: LM nl, LAD 25p - ? hypodense  but no filling defect, RI nl, LCX nl, RCA nl, EF 55-65%-->Med Rx.   Erectile dysfunction    History of echocardiogram    a. 01/2017 Echo: EF 55-60%, no rwma.   Hypertension     Surgical History: Past Surgical History:  Procedure Laterality Date   LEFT HEART CATH AND CORONARY ANGIOGRAPHY N/A 01/15/2017   Procedure: LEFT HEART CATH AND CORONARY ANGIOGRAPHY;  Surgeon: [place], Peter M, MD;  Location: [facility];  Service: Cardiovascular;  Laterality: N/A;   ORIF trocanteric femoral IM Nail Right     Social History: Social History   Socioeconomic History   Marital status: Significant Other    Spouse name: Not on file   Number of children: Not on file   Years of education: Not on file   Highest education level: Not on file  Occupational History   Not on file  Tobacco Use   Smoking status:  Never   Smokeless tobacco: Never  Vaping Use   Vaping Use: Never used  Substance and Sexual Activity   Alcohol use: No   Drug use: No   Sexual activity: Yes  Other Topics Concern   Not on file  Social History Narrative   Not on file   Social Determinants of Health   Financial Resource Strain: Low Risk  (06/26/2020)   Overall Financial Resource Strain (CARDIA)    Difficulty of Paying Living Expenses: Not hard at all  Food Insecurity: No Food Insecurity (06/26/2020)   Hunger Vital Sign    Worried About Running Out of Food in the Last Year: Never true    Ran Out of Food in the Last Year: Never true  Transportation Needs: No Transportation Needs (06/26/2020)   PRAPARE - Hydrologist (Medical): No    Lack of Transportation (Non-Medical): No  Physical Activity: Insufficiently Active (06/26/2020)   Exercise Vital Sign    Days of Exercise per Week: 2 days    Minutes of Exercise per Session: 10 min  Stress: No Stress Concern Present (06/26/2020)   [place]    Feeling of Stress : Not at all  Social Connections: Moderately Integrated (06/26/2020)   Social Connection and Isolation Panel [NHANES]    Frequency of Communication with Friends and Family: More than three times a week    Frequency of Social Gatherings with Friends and Family: More than three times a week    Attends Religious Services: 1 to 4 times per year    Active Member of [employer] or Organizations: No    Attends Archivist Meetings: 1 to 4 times per year    Marital Status: Divorced  Human resources officer Violence: Not At Risk (06/26/2020)   Humiliation, Afraid, Rape, and Kick questionnaire    Fear of Current or Ex-Partner: No    Emotionally Abused: No    Physically Abused: No    Sexually Abused: No    Family History: Family History  Problem Relation Age of Onset   Cancer Mother        breast   Diabetes Mother    Diabetes  Sister    Hypertension Sister    Cancer Brother    Mental illness Sister     Current Medications:  Current Outpatient Medications:    acyclovir (ZOVIRAX) 400 MG tablet, Take 1 tablet (400 mg total) by mouth 2 (two) times daily., Disp: 60 tablet, Rfl: 5   aspirin EC 81 MG tablet, Take  by mouth., Disp: , Rfl:    calcium-vitamin D (OSCAL WITH D) 500-200 MG-UNIT tablet, Take 1 tablet by mouth., Disp: , Rfl:    Chlorphen-PE-Acetaminophen (NOREL AD) 4-10-325 MG TABS, Take 1 each by mouth 3 (three) times daily as needed (Nasal congestion)., Disp: 30 tablet, Rfl: 0   dexamethasone (DECADRON) 4 MG tablet, Take 10 tablets (40 mg total) by mouth once a week., Disp: 40 tablet, Rfl: 6   filgrastim-aafi (NIVESTYM) 480 MCG/0.8ML SOSY injection, Inject 2 - 480 mcg syringes under the skin daily for 7 days to equal a total daily dose of 960 mcg, Disp: 11.2 mL, Rfl: 0   lenalidomide (REVLIMID) 10 MG capsule, TAKE 1 CAPSULE BY MOUTH 1 TIME A DAY FOR 21 DAYS ON THEN 7 DAYS OFF, Disp: 21 capsule, Rfl: 0   metoprolol succinate (TOPROL-XL) 25 MG 24 hr tablet, Take 1 tablet (25 mg total) by mouth daily., Disp: 90 tablet, Rfl: 3   Multiple Vitamin (MULTIVITAMIN) tablet, Take 1 tablet by mouth daily.  , Disp: , Rfl:    Multiple Vitamins/Iron TABS, Take by mouth., Disp: , Rfl:    nitroGLYCERIN (NITROSTAT) 0.4 MG SL tablet, Place 1 tablet (0.4 mg total) under the tongue every 5 (five) minutes for 3 doses as needed for chest pain., Disp: 25 tablet, Rfl: 3   ondansetron (ZOFRAN) 8 MG tablet, , Disp: , Rfl:    prochlorperazine (COMPAZINE) 10 MG tablet, , Disp: , Rfl:    rosuvastatin (CRESTOR) 40 MG tablet, Take 1 tablet (40 mg total) by mouth every other day., Disp: 90 tablet, Rfl: 1   traMADol (ULTRAM) 50 MG tablet, Take 1 tablet (50 mg total) by mouth every 6 (six) hours as needed., Disp: 60 tablet, Rfl: 0   Allergies: No Known Allergies  REVIEW OF SYSTEMS:   Review of Systems  Constitutional:  Negative for chills,  fatigue and fever.  HENT:   Negative for lump/mass, mouth sores, nosebleeds, sore throat and trouble swallowing.   Eyes:  Negative for eye problems.  Respiratory:  Negative for cough and shortness of breath.   Cardiovascular:  Negative for chest pain, leg swelling and palpitations.  Gastrointestinal:  Negative for abdominal pain, constipation, diarrhea, nausea and vomiting.  Genitourinary:  Negative for bladder incontinence, difficulty urinating, dysuria, frequency, hematuria and nocturia.   Musculoskeletal:  Negative for arthralgias, back pain, flank pain, myalgias and neck pain.  Skin:  Negative for itching and rash.  Neurological:  Negative for dizziness, headaches and numbness.  Hematological:  Does not bruise/bleed easily.  Psychiatric/Behavioral:  Negative for depression, sleep disturbance and suicidal ideas. The patient is not nervous/anxious.   All other systems reviewed and are negative.    VITALS:   Blood pressure 136/64, pulse (!) 58, temperature 98.2 F (36.8 C), temperature source Oral, resp. rate 18, weight 232 lb 2.3 oz (105.3 kg), SpO2 100 %.  Wt Readings from Last 3 Encounters:  03/17/22 232 lb 2.3 oz (105.3 kg)  02/17/22 229 lb 3.2 oz (104 kg)  01/20/22 223 lb 9.6 oz (101.4 kg)    Body mass index is 34.28 kg/m.  Performance status (ECOG): 1 - Symptomatic but completely ambulatory  PHYSICAL EXAM:   Physical Exam Vitals and nursing note reviewed. Exam conducted with a chaperone present.  Constitutional:      Appearance: Normal appearance.  Cardiovascular:     Rate and Rhythm: Normal rate and regular rhythm.     Pulses: Normal pulses.     Heart sounds: Normal heart sounds.  Pulmonary:     Effort: Pulmonary effort is normal.     Breath sounds: Normal breath sounds.  Abdominal:     Palpations: Abdomen is soft. There is no hepatomegaly, splenomegaly or mass.     Tenderness: There is no abdominal tenderness.  Musculoskeletal:     Right lower leg: No edema.      Left lower leg: No edema.  Lymphadenopathy:     Cervical: No cervical adenopathy.     Right cervical: No superficial, deep or posterior cervical adenopathy.    Left cervical: No superficial, deep or posterior cervical adenopathy.     Upper Body:     Right upper body: No supraclavicular or axillary adenopathy.     Left upper body: No supraclavicular or axillary adenopathy.  Neurological:     General: No focal deficit present.     Mental Status: He is alert and oriented to person, place, and time.  Psychiatric:        Mood and Affect: Mood normal.        Behavior: Behavior normal.     LABS:      Latest Ref Rng & Units 03/10/2022   10:59 AM 02/17/2022   12:48 PM 01/20/2022    1:22 PM  CBC  WBC 4.0 - 10.5 K/uL 3.3  4.4  3.7   Hemoglobin 13.0 - 17.0 g/dL 13.9  13.4  13.7   Hematocrit 39.0 - 52.0 % 41.7  40.9  42.2   Platelets 150 - 400 K/uL 225  181  203       Latest Ref Rng & Units 03/10/2022   10:59 AM 02/17/2022   12:48 PM 01/20/2022    1:22 PM  CMP  Glucose 70 - 99 mg/dL 148  110  108   BUN 8 - 23 mg/dL '11  10  15   '$ Creatinine 0.61 - 1.24 mg/dL 1.02  0.98  0.99   Sodium 135 - 145 mmol/L 137  138  137   Potassium 3.5 - 5.1 mmol/L 3.7  3.6  4.1   Chloride 98 - 111 mmol/L 101  100  101   CO2 22 - 32 mmol/L '26  29  28   '$ Calcium 8.9 - 10.3 mg/dL 9.0  8.8  9.1   Total Protein 6.5 - 8.1 g/dL 6.8  6.7  7.1   Total Bilirubin 0.3 - 1.2 mg/dL 0.6  0.8  0.8   Alkaline Phos 38 - 126 U/L 58  63  61   AST 15 - 41 U/L '27  29  26   '$ ALT 0 - 44 U/L '22  25  20      '$ No results found for: "CEA1", "CEA" / No results found for: "CEA1", "CEA" Lab Results  Component Value Date   PSA1 0.5 07/16/2020   No results found for: "[id number]" No results found for: "CAN125"  Lab Results  Component Value Date   TOTALPROTELP 6.3 03/10/2022   ALBUMINELP 3.9 03/10/2022   A1GS 0.2 03/10/2022   A2GS 0.6 03/10/2022   BETS 0.9 03/10/2022   BETA2SER 0.3 12/07/2018   GAMS 0.7 03/10/2022   MSPIKE Not  Observed 03/10/2022   SPEI Comment 03/10/2022   Lab Results  Component Value Date   FERRITIN 210 10/05/2017   Lab Results  Component Value Date   LDH 106 03/10/2022   LDH 104 02/17/2022   LDH 114 12/16/2021     STUDIES:   No results found.

## 2022-03-17 ENCOUNTER — Inpatient Hospital Stay: Payer: Medicare Other

## 2022-03-17 ENCOUNTER — Encounter (HOSPITAL_COMMUNITY): Payer: Self-pay | Admitting: Hematology

## 2022-03-17 ENCOUNTER — Encounter: Payer: Self-pay | Admitting: Hematology

## 2022-03-17 ENCOUNTER — Inpatient Hospital Stay: Payer: Medicare Other | Attending: Hematology | Admitting: Hematology

## 2022-03-17 VITALS — BP 136/64 | HR 58 | Temp 98.2°F | Resp 18 | Wt 232.1 lb

## 2022-03-17 DIAGNOSIS — C9000 Multiple myeloma not having achieved remission: Secondary | ICD-10-CM | POA: Insufficient documentation

## 2022-03-17 DIAGNOSIS — Z5112 Encounter for antineoplastic immunotherapy: Secondary | ICD-10-CM | POA: Diagnosis present

## 2022-03-17 DIAGNOSIS — M79651 Pain in right thigh: Secondary | ICD-10-CM | POA: Diagnosis not present

## 2022-03-17 DIAGNOSIS — M545 Low back pain, unspecified: Secondary | ICD-10-CM | POA: Diagnosis not present

## 2022-03-17 LAB — MISC LABCORP TEST (SEND OUT): Labcorp test code: 123218

## 2022-03-17 MED ORDER — DARATUMUMAB-HYALURONIDASE-FIHJ 1800-30000 MG-UT/15ML ~~LOC~~ SOLN
1800.0000 mg | Freq: Once | SUBCUTANEOUS | Status: AC
  Administered 2022-03-17: 1800 mg via SUBCUTANEOUS
  Filled 2022-03-17: qty 15

## 2022-03-17 MED ORDER — CETIRIZINE HCL 10 MG PO TABS
10.0000 mg | ORAL_TABLET | Freq: Once | ORAL | Status: AC
  Administered 2022-03-17: 10 mg via ORAL
  Filled 2022-03-17: qty 1

## 2022-03-17 MED ORDER — ZOLEDRONIC ACID 4 MG/100ML IV SOLN
4.0000 mg | Freq: Once | INTRAVENOUS | Status: AC
  Administered 2022-03-17: 4 mg via INTRAVENOUS
  Filled 2022-03-17: qty 100

## 2022-03-17 MED ORDER — SODIUM CHLORIDE 0.9 % IV SOLN: Freq: Once | INTRAVENOUS | Status: AC

## 2022-03-17 NOTE — Progress Notes (Signed)
Patient is taking Revlimid as prescribed.  He has not missed any doses and reports no side effects at this time.    Patient has been examined by Dr. Delton Coombes. Vital signs and labs have been reviewed by MD - ANC, Creatinine, LFTs, hemoglobin, and platelets are within treatment parameters per M.D. - pt may proceed with treatment.  Primary RN and pharmacy notified.

## 2022-03-17 NOTE — Patient Instructions (Signed)
[place] at [facility] Discharge Instructions   You were seen and examined today by Dr. Delton Coombes.  He reviewed the results of your lab work which are normal/stable. Your m-spike is not observed. The daratumumab-specific immunofixation results are pending. If this comes back negative, Dr. Raliegh Ip will consider discontinuing the monthly injections and just continue treatment with Revlimid and the weekly steroid (dexamethasone).   Continue Revlimid as prescribed.   We will proceed with your treatment today.   Return as scheduled.    Thank you for choosing [facility] [place] at [facility] to provide your oncology and hematology care.  To afford each patient quality time with our provider, please arrive at least 15 minutes before your scheduled appointment time.   If you have a lab appointment with the [facility] please come in thru the Main Entrance and check in at the main information desk.  You need to re-schedule your appointment should you arrive 10 or more minutes late.  We strive to give you quality time with our providers, and arriving late affects you and other patients whose appointments are after yours.  Also, if you no show three or more times for appointments you may be dismissed from the clinic at the providers discretion.     Again, thank you for choosing [facility].  Our hope is that these requests will decrease the amount of time that you wait before being seen by our physicians.       _____________________________________________________________  Should you have questions after your visit to [facility], please contact our office at [phone] and follow the prompts.  Our office hours are 8:00 a.m. and 4:30 p.m. Monday - Friday.  Please note that voicemails left after 4:00 p.m. may not be returned until the following business day.  We are closed weekends and major holidays.  You do have access to a nurse 24-7,  just call the main number to the clinic [phone] and do not press any options, hold on the line and a nurse will answer the phone.    For prescription refill requests, have your pharmacy contact our office and allow 72 hours.    Due to Covid, you will need to wear a mask upon entering the hospital. If you do not have a mask, a mask will be given to you at the Main Entrance upon arrival. For doctor visits, patients may have 1 support person age 89 or older with them. For treatment visits, patients can not have anyone with them due to social distancing guidelines and our immunocompromised population.

## 2022-03-17 NOTE — Patient Instructions (Signed)
[facility]  Discharge Instructions: Thank you for choosing [facility] to provide your oncology and hematology care.  If you have a lab appointment with the [facility], please come in thru the Main Entrance and check in at the main information desk.  Wear comfortable clothing and clothing appropriate for easy access to any Portacath or PICC line.   We strive to give you quality time with your provider. You may need to reschedule your appointment if you arrive late (15 or more minutes).  Arriving late affects you and other patients whose appointments are after yours.  Also, if you miss three or more appointments without notifying the office, you may be dismissed from the clinic at the provider's discretion.      For prescription refill requests, have your pharmacy contact our office and allow 72 hours for refills to be completed.    Today you received the following chemotherapy and/or immunotherapy agents Dara [place] and Zometa '4mg'$  IV   To help prevent nausea and vomiting after your treatment, we encourage you to take your nausea medication as directed.  Daratumumab Injection What is this medication? DARATUMUMAB (dar a toom ue mab) treats multiple myeloma, a type of bone marrow cancer. It works by helping your immune system slow or stop the spread of cancer cells. It is a monoclonal antibody. This medicine may be used for other purposes; ask your health care provider or pharmacist if you have questions. COMMON BRAND NAME(S): DARZALEX What should I tell my care team before I take this medication? They need to know if you have any of these conditions: Hereditary fructose intolerance Infection, such as chickenpox, herpes, hepatitis B Lung or breathing disease, such as asthma, COPD An unusual or allergic reaction to daratumumab, sorbitol, other medications, foods, dyes, or preservatives Pregnant or trying to get pregnant Breastfeeding How should I use this  medication? This medication is injected into a vein. It is given by your care team in a hospital or clinic setting. Talk to your care team about the use of this medication in children. Special care may be needed. Overdosage: If you think you have taken too much of this medicine contact a poison control center or emergency room at once. NOTE: This medicine is only for you. Do not share this medicine with others. What if I miss a dose? Keep appointments for follow-up doses. It is important not to miss your dose. Call your care team if you are unable to keep an appointment. What may interact with this medication? Interactions have not been studied. This list may not describe all possible interactions. Give your health care provider a list of all the medicines, herbs, non-prescription drugs, or dietary supplements you use. Also tell them if you smoke, drink alcohol, or use illegal drugs. Some items may interact with your medicine. What should I watch for while using this medication? Your condition will be monitored carefully while you are receiving this medication. This medication can cause serious allergic reactions. To reduce your risk, your care team may give you other medication to take before receiving this one. Be sure to follow the directions from your care team. This medication can affect the results of blood tests to match your blood type. These changes can last for up to 6 months after the final dose. Your care team will do blood tests to match your blood type before you start treatment. Tell all of your care team that you are being treated with this  medication before receiving a blood transfusion. This medication can affect the results of some tests used to determine treatment response; extra tests may be needed to evaluate response. Talk to your care team if you wish to become pregnant or think you are pregnant. This medication can cause serious birth defects if taken during pregnancy and for  3 months after the last dose. A reliable form of contraception is recommended while taking this medication and for 3 months after the last dose. Talk to your care team about effective forms of contraception. Do not breast-feed while taking this medication. What side effects may I notice from receiving this medication? Side effects that you should report to your care team as soon as possible: Allergic reactions--skin rash, itching, hives, swelling of the face, lips, tongue, or throat Infection--fever, chills, cough, sore throat, wounds that don't heal, pain or trouble when passing urine, general feeling of discomfort or being unwell Infusion reactions--chest pain, shortness of breath or trouble breathing, feeling faint or lightheaded Unusual bruising or bleeding Side effects that usually do not require medical attention (report to your care team if they continue or are bothersome): Constipation Diarrhea Fatigue Nausea Pain, tingling, or numbness in the hands or feet Swelling of the ankles, hands, or feet This list may not describe all possible side effects. Call your doctor for medical advice about side effects. You may report side effects to FDA at 1-800-FDA-1088. Where should I keep my medication? This medication is given in a hospital or clinic. It will not be stored at home. NOTE: This sheet is a summary. It may not cover all possible information. If you have questions about this medicine, talk to your doctor, pharmacist, or health care provider.  2023 Elsevier/Gold Standard (2021-04-23 00:00:00)   BELOW ARE SYMPTOMS THAT SHOULD BE REPORTED IMMEDIATELY: *FEVER GREATER THAN 100.4 F (38 C) OR HIGHER *CHILLS OR SWEATING *NAUSEA AND VOMITING THAT IS NOT CONTROLLED WITH YOUR NAUSEA MEDICATION *UNUSUAL SHORTNESS OF BREATH *UNUSUAL BRUISING OR BLEEDING *URINARY PROBLEMS (pain or burning when urinating, or frequent urination) *BOWEL PROBLEMS (unusual diarrhea, constipation, pain near the  anus) TENDERNESS IN MOUTH AND THROAT WITH OR WITHOUT PRESENCE OF ULCERS (sore throat, sores in mouth, or a toothache) UNUSUAL RASH, SWELLING OR PAIN  UNUSUAL VAGINAL DISCHARGE OR ITCHING   Items with * indicate a potential emergency and should be followed up as soon as possible or go to the Emergency Department if any problems should occur.  Please show the CHEMOTHERAPY ALERT CARD or IMMUNOTHERAPY ALERT CARD at check-in to the Emergency Department and triage nurse.  Should you have questions after your visit or need to cancel or reschedule your appointment, please contact [facility] [phone]  and follow the prompts.  Office hours are 8:00 a.m. to 4:30 p.m. Monday - Friday. Please note that voicemails left after 4:00 p.m. may not be returned until the following business day.  We are closed weekends and major holidays. You have access to a nurse at all times for urgent questions. Please call the main number to the clinic [phone] and follow the prompts.  For any non-urgent questions, you may also contact your provider using MyChart. We now offer e-Visits for anyone 79 and older to request care online for non-urgent symptoms. For details visit [email or web address].   Also download the MyChart app! Go to the app store, search "MyChart", open the app, select [facility], and log in with your MyChart username and password.

## 2022-03-17 NOTE — Progress Notes (Signed)
Pt presents today for Daratumumab [place] and '4mg'$   Zometa IV per provider's order. Vital signs and labs WNL for treatment. Okay to proceed with treatment today per Dr.K.  Daratumumab and '4mg'$  IV Zometa given today per MD orders. Tolerated infusion without adverse affects. Vital signs stable. No complaints at this time. Discharged from clinic ambulatory in stable condition. Alert and oriented x 3. F/U with [facility] as scheduled.

## 2022-03-23 ENCOUNTER — Other Ambulatory Visit: Payer: Self-pay | Admitting: Hematology

## 2022-03-24 ENCOUNTER — Other Ambulatory Visit: Payer: Self-pay | Admitting: Hematology

## 2022-03-26 ENCOUNTER — Other Ambulatory Visit: Payer: Self-pay

## 2022-03-26 MED ORDER — LENALIDOMIDE 10 MG PO CAPS: ORAL_CAPSULE | ORAL | 0 refills | Status: DC

## 2022-03-26 NOTE — Telephone Encounter (Signed)
Chart reviewed. Revlimid refilled per last office note with Dr. Katragadda.  

## 2022-03-30 ENCOUNTER — Other Ambulatory Visit (HOSPITAL_COMMUNITY): Payer: Self-pay

## 2022-03-30 ENCOUNTER — Other Ambulatory Visit: Payer: Self-pay | Admitting: Hematology

## 2022-03-30 DIAGNOSIS — C9000 Multiple myeloma not having achieved remission: Secondary | ICD-10-CM

## 2022-03-30 MED ORDER — ACYCLOVIR 400 MG PO TABS
400.0000 mg | ORAL_TABLET | Freq: Two times a day (BID) | ORAL | 5 refills | Status: DC
  Filled 2022-03-30 (×2): qty 60, 30d supply, fill #0

## 2022-04-01 ENCOUNTER — Other Ambulatory Visit (HOSPITAL_COMMUNITY): Payer: Self-pay

## 2022-04-01 ENCOUNTER — Encounter: Payer: Self-pay | Admitting: Hematology

## 2022-04-01 ENCOUNTER — Encounter (HOSPITAL_COMMUNITY): Payer: Self-pay | Admitting: Hematology

## 2022-04-14 ENCOUNTER — Other Ambulatory Visit: Payer: Self-pay

## 2022-04-14 ENCOUNTER — Inpatient Hospital Stay: Payer: Medicare Other

## 2022-04-14 ENCOUNTER — Inpatient Hospital Stay: Payer: Medicare Other | Attending: Hematology

## 2022-04-14 VITALS — BP 126/80 | HR 63 | Temp 97.8°F | Resp 18 | Wt 235.0 lb

## 2022-04-14 DIAGNOSIS — Z5112 Encounter for antineoplastic immunotherapy: Secondary | ICD-10-CM | POA: Diagnosis not present

## 2022-04-14 DIAGNOSIS — C9000 Multiple myeloma not having achieved remission: Secondary | ICD-10-CM | POA: Insufficient documentation

## 2022-04-14 LAB — COMPREHENSIVE METABOLIC PANEL
ALT: 26 U/L (ref 0–44)
AST: 25 U/L (ref 15–41)
Albumin: 3.9 g/dL (ref 3.5–5.0)
Alkaline Phosphatase: 58 U/L (ref 38–126)
Anion gap: 8 (ref 5–15)
BUN: 12 mg/dL (ref 8–23)
CO2: 27 mmol/L (ref 22–32)
Calcium: 9.2 mg/dL (ref 8.9–10.3)
Chloride: 102 mmol/L (ref 98–111)
Creatinine, Ser: 1.22 mg/dL (ref 0.61–1.24)
GFR, Estimated: >60 mL/min (ref >60)
Glucose, Bld: 128 mg/dL — ABNORMAL HIGH (ref 70–99)
Potassium: 3.8 mmol/L (ref 3.5–5.1)
Sodium: 137 mmol/L (ref 135–145)
Total Bilirubin: 0.7 mg/dL (ref 0.3–1.2)
Total Protein: 7 g/dL (ref 6.5–8.1)

## 2022-04-14 LAB — CBC WITH DIFFERENTIAL/PLATELET
Abs Immature Granulocytes: 0.03 10*3/uL (ref 0.00–0.07)
Basophils Absolute: 0 10*3/uL (ref 0.0–0.1)
Basophils Relative: 1 %
Eosinophils Absolute: 0.3 10*3/uL (ref 0.0–0.5)
Eosinophils Relative: 6 %
HCT: 41.6 % (ref 39.0–52.0)
Hemoglobin: 13.7 g/dL (ref 13.0–17.0)
Immature Granulocytes: 1 %
Lymphocytes Relative: 48 %
Lymphs Abs: 2.4 10*3/uL (ref 0.7–4.0)
MCH: 32.3 pg (ref 26.0–34.0)
MCHC: 32.9 g/dL (ref 30.0–36.0)
MCV: 98.1 fL (ref 80.0–100.0)
Monocytes Absolute: 0.7 10*3/uL (ref 0.1–1.0)
Monocytes Relative: 14 %
Neutro Abs: 1.4 10*3/uL — ABNORMAL LOW (ref 1.7–7.7)
Neutrophils Relative %: 30 %
Platelets: 222 10*3/uL (ref 150–400)
RBC: 4.24 MIL/uL (ref 4.22–5.81)
RDW: 13 % (ref 11.5–15.5)
WBC: 4.8 10*3/uL (ref 4.0–10.5)
nRBC: 0 % (ref 0.0–0.2)

## 2022-04-14 LAB — MAGNESIUM: Magnesium: 1.6 mg/dL — ABNORMAL LOW (ref 1.7–2.4)

## 2022-04-14 MED ORDER — ZOLEDRONIC ACID 4 MG/100ML IV SOLN
4.0000 mg | Freq: Once | INTRAVENOUS | Status: AC
  Administered 2022-04-14: 4 mg via INTRAVENOUS
  Filled 2022-04-14: qty 100

## 2022-04-14 MED ORDER — DARATUMUMAB-HYALURONIDASE-FIHJ 1800-30000 MG-UT/15ML ~~LOC~~ SOLN
1800.0000 mg | Freq: Once | SUBCUTANEOUS | Status: AC
  Administered 2022-04-14: 1800 mg via SUBCUTANEOUS
  Filled 2022-04-14: qty 15

## 2022-04-14 MED ORDER — SODIUM CHLORIDE 0.9 % IV SOLN: Freq: Once | INTRAVENOUS | Status: AC

## 2022-04-14 NOTE — Patient Instructions (Signed)
[facility]  Discharge Instructions: Thank you for choosing [facility] to provide your oncology and hematology care.  If you have a lab appointment with the [facility], please come in thru the Main Entrance and check in at the main information desk.  Wear comfortable clothing and clothing appropriate for easy access to any Portacath or PICC line.   We strive to give you quality time with your provider. You may need to reschedule your appointment if you arrive late (15 or more minutes).  Arriving late affects you and other patients whose appointments are after yours.  Also, if you miss three or more appointments without notifying the office, you may be dismissed from the clinic at the provider's discretion.      For prescription refill requests, have your pharmacy contact our office and allow 72 hours for refills to be completed.    Today you received the following chemotherapy and/or immunotherapy agents Zometa/Daratumumab      To help prevent nausea and vomiting after your treatment, we encourage you to take your nausea medication as directed.  BELOW ARE SYMPTOMS THAT SHOULD BE REPORTED IMMEDIATELY: *FEVER GREATER THAN 100.4 F (38 C) OR HIGHER *CHILLS OR SWEATING *NAUSEA AND VOMITING THAT IS NOT CONTROLLED WITH YOUR NAUSEA MEDICATION *UNUSUAL SHORTNESS OF BREATH *UNUSUAL BRUISING OR BLEEDING *URINARY PROBLEMS (pain or burning when urinating, or frequent urination) *BOWEL PROBLEMS (unusual diarrhea, constipation, pain near the anus) TENDERNESS IN MOUTH AND THROAT WITH OR WITHOUT PRESENCE OF ULCERS (sore throat, sores in mouth, or a toothache) UNUSUAL RASH, SWELLING OR PAIN  UNUSUAL VAGINAL DISCHARGE OR ITCHING   Items with * indicate a potential emergency and should be followed up as soon as possible or go to the Emergency Department if any problems should occur.  Please show the CHEMOTHERAPY ALERT CARD or IMMUNOTHERAPY ALERT CARD at check-in to  the Emergency Department and triage nurse.  Should you have questions after your visit or need to cancel or reschedule your appointment, please contact [facility] [phone]  and follow the prompts.  Office hours are 8:00 a.m. to 4:30 p.m. Monday - Friday. Please note that voicemails left after 4:00 p.m. may not be returned until the following business day.  We are closed weekends and major holidays. You have access to a nurse at all times for urgent questions. Please call the main number to the clinic [phone] and follow the prompts.  For any non-urgent questions, you may also contact your provider using MyChart. We now offer e-Visits for anyone 46 and older to request care online for non-urgent symptoms. For details visit [email or web address].   Also download the MyChart app! Go to the app store, search "MyChart", open the app, select [facility], and log in with your MyChart username and password.

## 2022-04-14 NOTE — Progress Notes (Signed)
Patient presents today for Daratumumab injection per providers order.  Vital signs within parameters.  Labs reviewed, Green noted to be 1.4, MD notified.  Patient also to receive Zometa infusion today.  Stable during administration without incident; injection site WNL; see MAR for injection details.  Patient tolerated procedure well and without incident.  No questions or complaints noted at this time.  Stable during infusion without adverse affects.  Vital signs stable.  Discharge from clinic ambulatory in stable condition.  Alert and oriented X 3.  Follow up with [facility] as scheduled.

## 2022-04-15 LAB — KAPPA/LAMBDA LIGHT CHAINS
Kappa free light chain: 20.5 mg/L — ABNORMAL HIGH (ref 3.3–19.4)
Kappa, lambda light chain ratio: 1.69 — ABNORMAL HIGH (ref 0.26–1.65)
Lambda free light chains: 12.1 mg/L (ref 5.7–26.3)

## 2022-04-16 ENCOUNTER — Other Ambulatory Visit: Payer: Self-pay

## 2022-04-16 LAB — PROTEIN ELECTROPHORESIS, SERUM
A/G Ratio: 1.4 (ref 0.7–1.7)
Albumin ELP: 3.7 g/dL (ref 2.9–4.4)
Alpha-1-Globulin: 0.2 g/dL (ref 0.0–0.4)
Alpha-2-Globulin: 0.7 g/dL (ref 0.4–1.0)
Beta Globulin: 1 g/dL (ref 0.7–1.3)
Gamma Globulin: 0.8 g/dL (ref 0.4–1.8)
Globulin, Total: 2.6 g/dL (ref 2.2–3.9)
Total Protein ELP: 6.3 g/dL (ref 6.0–8.5)

## 2022-04-21 ENCOUNTER — Other Ambulatory Visit: Payer: Self-pay | Admitting: Hematology

## 2022-04-21 LAB — MISC LABCORP TEST (SEND OUT): Labcorp test code: 123218

## 2022-04-22 ENCOUNTER — Encounter (HOSPITAL_COMMUNITY): Payer: Self-pay | Admitting: Hematology

## 2022-04-22 ENCOUNTER — Encounter: Payer: Self-pay | Admitting: Hematology

## 2022-04-22 ENCOUNTER — Other Ambulatory Visit: Payer: Self-pay | Admitting: *Deleted

## 2022-04-28 ENCOUNTER — Other Ambulatory Visit (HOSPITAL_COMMUNITY): Payer: Self-pay

## 2022-05-12 ENCOUNTER — Inpatient Hospital Stay: Payer: Medicare Other

## 2022-05-12 VITALS — BP 136/72 | HR 67 | Temp 96.8°F | Resp 18 | Wt 233.2 lb

## 2022-05-12 DIAGNOSIS — C9000 Multiple myeloma not having achieved remission: Secondary | ICD-10-CM

## 2022-05-12 DIAGNOSIS — Z5112 Encounter for antineoplastic immunotherapy: Secondary | ICD-10-CM | POA: Diagnosis not present

## 2022-05-12 LAB — COMPREHENSIVE METABOLIC PANEL
ALT: 28 U/L (ref 0–44)
AST: 32 U/L (ref 15–41)
Albumin: 4 g/dL (ref 3.5–5.0)
Alkaline Phosphatase: 59 U/L (ref 38–126)
Anion gap: 9 (ref 5–15)
BUN: 13 mg/dL (ref 8–23)
CO2: 26 mmol/L (ref 22–32)
Calcium: 9 mg/dL (ref 8.9–10.3)
Chloride: 102 mmol/L (ref 98–111)
Creatinine, Ser: 1.02 mg/dL (ref 0.61–1.24)
GFR, Estimated: >60 mL/min (ref >60)
Glucose, Bld: 125 mg/dL — ABNORMAL HIGH (ref 70–99)
Potassium: 3.7 mmol/L (ref 3.5–5.1)
Sodium: 137 mmol/L (ref 135–145)
Total Bilirubin: 0.9 mg/dL (ref 0.3–1.2)
Total Protein: 7.1 g/dL (ref 6.5–8.1)

## 2022-05-12 LAB — CBC WITH DIFFERENTIAL/PLATELET
Abs Immature Granulocytes: 0.01 10*3/uL (ref 0.00–0.07)
Basophils Absolute: 0 10*3/uL (ref 0.0–0.1)
Basophils Relative: 0 %
Eosinophils Absolute: 0.3 10*3/uL (ref 0.0–0.5)
Eosinophils Relative: 6 %
HCT: 42.4 % (ref 39.0–52.0)
Hemoglobin: 13.8 g/dL (ref 13.0–17.0)
Immature Granulocytes: 0 %
Lymphocytes Relative: 53 %
Lymphs Abs: 2.4 10*3/uL (ref 0.7–4.0)
MCH: 32.3 pg (ref 26.0–34.0)
MCHC: 32.5 g/dL (ref 30.0–36.0)
MCV: 99.3 fL (ref 80.0–100.0)
Monocytes Absolute: 0.4 10*3/uL (ref 0.1–1.0)
Monocytes Relative: 10 %
Neutro Abs: 1.3 10*3/uL — ABNORMAL LOW (ref 1.7–7.7)
Neutrophils Relative %: 31 %
Platelets: 208 10*3/uL (ref 150–400)
RBC: 4.27 MIL/uL (ref 4.22–5.81)
RDW: 13.1 % (ref 11.5–15.5)
WBC: 4.4 10*3/uL (ref 4.0–10.5)
nRBC: 0 % (ref 0.0–0.2)

## 2022-05-12 LAB — MAGNESIUM: Magnesium: 1.8 mg/dL (ref 1.7–2.4)

## 2022-05-12 MED ORDER — CETIRIZINE HCL 10 MG PO TABS
10.0000 mg | ORAL_TABLET | Freq: Once | ORAL | Status: AC
  Administered 2022-05-12: 10 mg via ORAL
  Filled 2022-05-12: qty 1

## 2022-05-12 MED ORDER — DARATUMUMAB-HYALURONIDASE-FIHJ 1800-30000 MG-UT/15ML ~~LOC~~ SOLN
1800.0000 mg | Freq: Once | SUBCUTANEOUS | Status: AC
  Administered 2022-05-12: 1800 mg via SUBCUTANEOUS
  Filled 2022-05-12: qty 15

## 2022-05-12 NOTE — Progress Notes (Signed)
Patient presents today for Dara [place] infusion.  Patient is in satisfactory condition with no new complaints voiced.  Vital signs are stable.  Labs reviewed and all labs are within treatment parameters.  We will proceed with treatment per MD orders.    Dara [place] given today per MD orders. Tolerated infusion without adverse affects. Vital signs stable. No complaints at this time. Discharged from clinic ambulatory in stable condition. Alert and oriented x 3. F/U with [facility] as scheduled.

## 2022-05-12 NOTE — Patient Instructions (Signed)
[facility]  Discharge Instructions: Thank you for choosing [facility] to provide your oncology and hematology care.  If you have a lab appointment with the Cancer Center - please note that after April 8th, 2024, all labs will be drawn in the cancer center.  You do not have to check in or register with the main entrance as you have in the past but will complete your check-in in the cancer center.  Wear comfortable clothing and clothing appropriate for easy access to any Portacath or PICC line.   We strive to give you quality time with your provider. You may need to reschedule your appointment if you arrive late (15 or more minutes).  Arriving late affects you and other patients whose appointments are after yours.  Also, if you miss three or more appointments without notifying the office, you may be dismissed from the clinic at the provider's discretion.      For prescription refill requests, have your pharmacy contact our office and allow 72 hours for refills to be completed.    Today you received the following chemotherapy and/or immunotherapy agents Dara [place]   To help prevent nausea and vomiting after your treatment, we encourage you to take your nausea medication as directed.  Daratumumab; Hyaluronidase Injection What is this medication? DARATUMUMAB; HYALURONIDASE (dar a toom ue mab; hye al ur ON i dase) treats multiple myeloma, a type of bone marrow cancer. Daratumumab works by blocking a protein that causes cancer cells to grow and multiply. This helps to slow or stop the spread of cancer cells. Hyaluronidase works by increasing the absorption of other medications in the body to help them work better. This medication may also be used treat amyloidosis, a condition that causes the buildup of a protein (amyloid) in your body. It works by reducing the buildup of this protein, which decreases symptoms. It is a combination medication that contains a monoclonal  antibody. This medicine may be used for other purposes; ask your health care provider or pharmacist if you have questions. COMMON BRAND NAME(S): DARZALEX FASPRO What should I tell my care team before I take this medication? They need to know if you have any of these conditions: Heart disease Infection, such as chickenpox, cold sores, herpes, hepatitis B Lung or breathing disease An unusual or allergic reaction to daratumumab, hyaluronidase, other medications, foods, dyes, or preservatives Pregnant or trying to get pregnant Breast-feeding How should I use this medication? This medication is injected under the skin. It is given by your care team in a hospital or clinic setting. Talk to your care team about the use of this medication in children. Special care may be needed. Overdosage: If you think you have taken too much of this medicine contact a poison control center or emergency room at once. NOTE: This medicine is only for you. Do not share this medicine with others. What if I miss a dose? Keep appointments for follow-up doses. It is important not to miss your dose. Call your care team if you are unable to keep an appointment. What may interact with this medication? Interactions have not been studied. This list may not describe all possible interactions. Give your health care provider a list of all the medicines, herbs, non-prescription drugs, or dietary supplements you use. Also tell them if you smoke, drink alcohol, or use illegal drugs. Some items may interact with your medicine. What should I watch for while using this medication? Your condition will be monitored   carefully while you are receiving this medication. This medication can cause serious allergic reactions. To reduce your risk, your care team may give you other medication to take before receiving this one. Be sure to follow the directions from your care team. This medication can affect the results of blood tests to match your  blood type. These changes can last for up to 6 months after the final dose. Your care team will do blood tests to match your blood type before you start treatment. Tell all of your care team that you are being treated with this medication before receiving a blood transfusion. This medication can affect the results of some tests used to determine treatment response; extra tests may be needed to evaluate response. Talk to your care team if you wish to become pregnant or think you are pregnant. This medication can cause serious birth defects if taken during pregnancy and for 3 months after the last dose. A reliable form of contraception is recommended while taking this medication and for 3 months after the last dose. Talk to your care team about effective forms of contraception. Do not breast-feed while taking this medication. What side effects may I notice from receiving this medication? Side effects that you should report to your care team as soon as possible: Allergic reactions--skin rash, itching, hives, swelling of the face, lips, tongue, or throat Heart rhythm changes--fast or irregular heartbeat, dizziness, feeling faint or lightheaded, chest pain, trouble breathing Infection--fever, chills, cough, sore throat, wounds that don't heal, pain or trouble when passing urine, general feeling of discomfort or being unwell Infusion reactions--chest pain, shortness of breath or trouble breathing, feeling faint or lightheaded Sudden eye pain or change in vision such as blurry vision, seeing halos around lights, vision loss Unusual bruising or bleeding Side effects that usually do not require medical attention (report to your care team if they continue or are bothersome): Constipation Diarrhea Fatigue Nausea Pain, tingling, or numbness in the hands or feet Swelling of the ankles, hands, or feet This list may not describe all possible side effects. Call your doctor for medical advice about side effects.  You may report side effects to FDA at 1-800-FDA-1088. Where should I keep my medication? This medication is given in a hospital or clinic. It will not be stored at home. NOTE: This sheet is a summary. It may not cover all possible information. If you have questions about this medicine, talk to your doctor, pharmacist, or health care provider.  2023 Elsevier/Gold Standard (2021-04-23 00:00:00)   BELOW ARE SYMPTOMS THAT SHOULD BE REPORTED IMMEDIATELY: *FEVER GREATER THAN 100.4 F (38 C) OR HIGHER *CHILLS OR SWEATING *NAUSEA AND VOMITING THAT IS NOT CONTROLLED WITH YOUR NAUSEA MEDICATION *UNUSUAL SHORTNESS OF BREATH *UNUSUAL BRUISING OR BLEEDING *URINARY PROBLEMS (pain or burning when urinating, or frequent urination) *BOWEL PROBLEMS (unusual diarrhea, constipation, pain near the anus) TENDERNESS IN MOUTH AND THROAT WITH OR WITHOUT PRESENCE OF ULCERS (sore throat, sores in mouth, or a toothache) UNUSUAL RASH, SWELLING OR PAIN  UNUSUAL VAGINAL DISCHARGE OR ITCHING   Items with * indicate a potential emergency and should be followed up as soon as possible or go to the Emergency Department if any problems should occur.  Please show the CHEMOTHERAPY ALERT CARD or IMMUNOTHERAPY ALERT CARD at check-in to the Emergency Department and triage nurse.  Should you have questions after your visit or need to cancel or reschedule your appointment, please contact [facility] AT  [phone]  and follow the prompts.    Office hours are 8:00 a.m. to 4:30 p.m. Monday - Friday. Please note that voicemails left after 4:00 p.m. may not be returned until the following business day.  We are closed weekends and major holidays. You have access to a nurse at all times for urgent questions. Please call the main number to the clinic [phone] and follow the prompts.  For any non-urgent questions, you may also contact your provider using MyChart. We now offer e-Visits for anyone 18 and older to request  care online for non-urgent symptoms. For details visit [email or web address].   Also download the MyChart app! Go to the app store, search "MyChart", open the app, select [facility], and log in with your MyChart username and password.   

## 2022-05-12 NOTE — Progress Notes (Signed)
Ok to proceed with ANC 1.3  T.O. Dr Carilyn Goodpasture, PharmD

## 2022-05-18 ENCOUNTER — Other Ambulatory Visit: Payer: Self-pay

## 2022-05-18 MED ORDER — LENALIDOMIDE 10 MG PO CAPS: ORAL_CAPSULE | ORAL | 0 refills | Status: DC

## 2022-05-18 NOTE — Telephone Encounter (Signed)
Chart reviewed. Revlimid refilled per last office note with Dr. Katragadda.  

## 2022-06-06 ENCOUNTER — Other Ambulatory Visit: Payer: Self-pay

## 2022-06-08 NOTE — Progress Notes (Signed)
[facility] [address] [place] [address]    Clinic Day:  06/09/2022  Referring physician: Anabel Halon, MD  Patient Care Team: Anabel Halon, MD as PCP - General (Internal Medicine) Wyline Mood Dorothe Pea, MD as PCP - Cardiology (Cardiology) Lanelle Bal, DO as Consulting Physician (Internal Medicine) Therese Sarah, RN as Oncology Nurse Navigator (Oncology) Doreatha Massed, MD as Medical Oncologist (Oncology)   ASSESSMENT & PLAN:   Assessment: 1.  Standard risk plasma cell myeloma: - He reported low back pain radiating to the right thigh since April, pain gets worse when he walks for more than 2 blocks. - He had history of MGUS and was last seen in our clinic in 2019.  He had an abnormal M spike of 3.2 g on 12/06/2018. - Skeletal survey on 10/22/2017 showed no suspicious lytic lesions. - X-ray of the right sided pelvis on 06/11/2020 showed prominent lytic lesions noted in the proximal midportion of the right femoral diaphysis with no evidence of fracture or dislocation. - Bone marrow biopsy on 07/09/2020 with hypercellular marrow with trilineage hematopoiesis.  Plasma cells representing 10% of cells. - Chromosome analysis 46, XY (20).  Multiple myeloma FISH panel negative. - PET scan on 07/04/2020 with multiple hypermetabolic bony soft tissue lesions.  Dominant lesions in the L5 vertebral body and posterior right acetabulum and distal right femur.  Focal increased uptake in the right tonsillar region with associated soft tissue fullness on CT imaging. - 24-hour urine with total protein 134. - Beta-2 microglobulin 2.1 (06/26/2020), LDH 136. - Dara RVD cycle 1 started on 09/03/2020. - He had stem cell collection at [facility].  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 - Maintenance daratumumab monthly and Revlimid 10 mg 3 weeks on/1 week off started on 04/08/2021. - We reviewed PET scan (07/03/2021): Lytic soft tissue lesion in the right L5  vertebral body measures 3.7 x 2.8 cm SUV 2.9, previously 3.4 x 3.4 cm, SUV 6.6.  Soft tissue lesion in the posterior right acetabulum measures 2.2 cm SUV 2.5, previously 2.2 cm with SUV 9.  Hypermetabolism in the left tonsil SUV 5.9.  Direct oropharyngeal exam did not show any mass.  New mildly hypermetabolic left lower pulmonary nodules nonspecific.  2.  Social/family history: - He works at [employer] in environmental services. - He was never smoker. - 2 brothers died of metastatic lung cancer.  Sister has thyroid cancer.  Mother had breast cancer.    Plan: 1.  Stage I standard risk IgG kappa multiple myeloma: - He denies any infections in the past 3 months. - Reviewed myeloma labs from 04/14/2022: M spike was negative.  Free light chain ratio is 1.69 with kappa light chains 20.5. - Labs today: Normal CBC and LFTs.  Creatinine normal.  LDH normal. - Continue Revlimid 10 mg 3 weeks on/1 week off.  Continue monthly Darzalex and dexamethasone 40 mg on days of Darzalex. - He reports pain in his left knee when he flexes it.  Pain is reported in the medial compartment which started around 06/04/2022.  No trauma reported. - Recommend x-rays of the left knee.  Recommend follow-up with Dr. Hilda Lias.  2.  Right mid thigh lesion: - He does not report any pain in the right thigh.  3.  Infection prophylaxis: - Continue acyclovir for shingles prophylaxis.  Continue aspirin for thromboprophylaxis.  4.  Myeloma bone disease: - Continue Zometa monthly until August of this year.  Then  will switch him to every 12 weeks.    Orders Placed This Encounter  Procedures   DG Knee 3 Views Left    Standing Status:   Future    Number of Occurrences:   1    Standing Expiration Date:   06/09/2023    Order Specific Question:   Reason for Exam (SYMPTOM  OR DIAGNOSIS REQUIRED)    Answer:   acute left knee pain    Order Specific Question:   Preferred imaging location?    Answer:   [facility]   IFE,  Dara-Specific, Serum      I,Katie Daubenspeck,acting as a scribe for Doreatha Massed, MD.,have documented all relevant documentation on the behalf of Doreatha Massed, MD,as directed by  Doreatha Massed, MD while in the presence of Doreatha Massed, MD.   I, Doreatha Massed MD, have reviewed the above documentation for accuracy and completeness, and I agree with the above.   Doreatha Massed, MD   5/28/20244:42 PM  CHIEF COMPLAINT:   Diagnosis: multiple myeloma    Cancer Staging  Multiple myeloma without remission (HCC) Staging form: Plasma Cell Myeloma and Plasma Cell Disorders, AJCC 8th Edition - Clinical stage from 07/24/2020: RISS Stage I (Beta-2-microglobulin (mg/L): 2.1, Albumin (g/dL): 3.7, ISS: Stage I, High-risk cytogenetics: Absent, LDH: Normal) - Signed by Artis Delay, MD on 07/24/2020    Prior Therapy: DaraVRd 09/03/20 - 02/26/21  Current Therapy:  maintenance Revlimid/daratumumab   HISTORY OF PRESENT ILLNESS:   Oncology History  Multiple myeloma without remission (HCC)  07/09/2020 Pathology Results   BONE MARROW, ASPIRATE, CLOT, CORE:  -Hypercellular bone marrow with plasma cell neoplasm  -See comment   PERIPHERAL BLOOD:  -Slight macrocytic anemia   COMMENT:   The bone marrow is hypercellular for age with trilineage hematopoiesis and nonspecific myeloid changes.  In this background, the plasma cells are increased in number representing 10% of all cells associated with atypical cytomorphologic features.  The plasma cells display kappa light chain restriction consistent with plasma cell neoplasm.  Correlation with cytogenetic and FISH studies is recommended.    07/24/2020 Initial Diagnosis   Multiple myeloma without remission (HCC)   07/24/2020 Cancer Staging   Staging form: Plasma Cell Myeloma and Plasma Cell Disorders, AJCC 8th Edition - Clinical stage from 07/24/2020: RISS Stage I (Beta-2-microglobulin (mg/L): 2.1, Albumin (g/dL): 3.7, ISS:  Stage I, High-risk cytogenetics: Absent, LDH: Normal) - Signed by Artis Delay, MD on 07/24/2020 Stage prefix: Initial diagnosis Beta 2 microglobulin range (mg/L): Less than 3.5 Albumin range (g/dL): Greater than or equal to 3.5 Cytogenetics: No abnormalities   09/03/2020 - 09/02/2021 Chemotherapy   Patient is on Treatment Plan : MYELOMA NEWLY DIAGNOSED TRANSPLANT CANDIDATE DaraVRd (Daratumumab SQ) q21d x 6 Cycles (Induction/Consolidation)     04/16/2021 -  Chemotherapy   Patient is on Treatment Plan : MYELOMA Daratumumab SQ q28d        INTERVAL HISTORY:   Eugene [name] is a 67 y.o. male presenting to clinic today for follow up of multiple myeloma. He was last seen by me on 03/17/22.  Today, he states that he is doing well overall. His appetite level is at 100%. His energy level is at 50%.  PAST MEDICAL HISTORY:   Past Medical History: Past Medical History:  Diagnosis Date   Back pain    CAD (coronary artery disease)    a. 01/2017 NSTEMI/Cath: LM nl, LAD 25p - ? hypodense but no filling defect, RI nl, LCX nl, RCA nl, EF 55-65%-->Med Rx.  Erectile dysfunction    History of echocardiogram    a. 01/2017 Echo: EF 55-60%, no rwma.   Hypertension     Surgical History: Past Surgical History:  Procedure Laterality Date   LEFT HEART CATH AND CORONARY ANGIOGRAPHY N/A 01/15/2017   Procedure: LEFT HEART CATH AND CORONARY ANGIOGRAPHY;  Surgeon: [place]az[place], Peter M, MD;  Location: [facility] INVASIVE CV LAB;  Service: Cardiovascular;  Laterality: N/A;   ORIF trocanteric femoral IM Nail Right     Social History: Social History   Socioeconomic History   Marital status: Significant Other    Spouse name: Not on file   Number of children: Not on file   Years of education: Not on file   Highest education level: Not on file  Occupational History   Not on file  Tobacco Use   Smoking status: Never   Smokeless tobacco: Never  Vaping Use   Vaping Use: Never used  Substance and Sexual Activity   Alcohol use: No    Drug use: No   Sexual activity: Yes  Other Topics Concern   Not on file  Social History Narrative   Not on file   Social Determinants of Health   Financial Resource Strain: Low Risk  (06/26/2020)   Overall Financial Resource Strain (CARDIA)    Difficulty of Paying Living Expenses: Not hard at all  Food Insecurity: No Food Insecurity (06/26/2020)   Hunger Vital Sign    Worried About Running Out of Food in the Last Year: Never true    Ran Out of Food in the Last Year: Never true  Transportation Needs: No Transportation Needs (06/26/2020)   PRAPARE - Administrator, Civil Service (Medical): No    Lack of Transportation (Non-Medical): No  Physical Activity: Insufficiently Active (06/26/2020)   Exercise Vital Sign    Days of Exercise per Week: 2 days    Minutes of Exercise per Session: 10 min  Stress: No Stress Concern Present (06/26/2020)   [employer] of Occupational Health - Occupational Stress Questionnaire    Feeling of Stress : Not at all  Social Connections: Moderately Integrated (06/26/2020)   Social Connection and Isolation Panel [NHANES]    Frequency of Communication with Friends and Family: More than three times a week    Frequency of Social Gatherings with Friends and Family: More than three times a week    Attends Religious Services: 1 to 4 times per year    Active Member of [employer] or Organizations: No    Attends Banker Meetings: 1 to 4 times per year    Marital Status: Divorced  Catering manager Violence: Not At Risk (06/26/2020)   Humiliation, Afraid, Rape, and Kick questionnaire    Fear of Current or Ex-Partner: No    Emotionally Abused: No    Physically Abused: No    Sexually Abused: No    Family History: Family History  Problem Relation Age of Onset   Cancer Mother        breast   Diabetes Mother    Diabetes Sister    Hypertension Sister    Cancer Brother    Mental illness Sister     Current Medications:  Current  Outpatient Medications:    acyclovir (ZOVIRAX) 400 MG tablet, Take 1 tablet (400 mg total) by mouth 2 (two) times daily., Disp: 60 tablet, Rfl: 5   aspirin EC 81 MG tablet, Take by mouth., Disp: , Rfl:    calcium-vitamin D (OSCAL WITH D) 500-200 MG-UNIT  tablet, Take 1 tablet by mouth., Disp: , Rfl:    Chlorphen-PE-Acetaminophen (NOREL AD) 4-10-325 MG TABS, Take 1 each by mouth 3 (three) times daily as needed (Nasal congestion)., Disp: 30 tablet, Rfl: 0   dexamethasone (DECADRON) 4 MG tablet, Take 10 tablets (40 mg total) by mouth once a week., Disp: 40 tablet, Rfl: 6   filgrastim-aafi (NIVESTYM) 480 MCG/0.8ML SOSY injection, Inject 2 - 480 mcg syringes under the skin daily for 7 days to equal a total daily dose of 960 mcg, Disp: 11.2 mL, Rfl: 0   lenalidomide (REVLIMID) 10 MG capsule, TAKE 1 CAPSULE BY MOUTH 1 TIME A DAY FOR 21 DAYS ON THEN 7 DAYS OFF, Disp: 21 capsule, Rfl: 0   metoprolol succinate (TOPROL-XL) 25 MG 24 hr tablet, Take 1 tablet (25 mg total) by mouth daily., Disp: 90 tablet, Rfl: 3   Multiple Vitamin (MULTIVITAMIN) tablet, Take 1 tablet by mouth daily.  , Disp: , Rfl:    Multiple Vitamins/Iron TABS, Take by mouth., Disp: , Rfl:    nitroGLYCERIN (NITROSTAT) 0.4 MG SL tablet, Place 1 tablet (0.4 mg total) under the tongue every 5 (five) minutes for 3 doses as needed for chest pain., Disp: 25 tablet, Rfl: 3   ondansetron (ZOFRAN) 8 MG tablet, , Disp: , Rfl:    prochlorperazine (COMPAZINE) 10 MG tablet, , Disp: , Rfl:    rosuvastatin (CRESTOR) 40 MG tablet, Take 1 tablet (40 mg total) by mouth every other day., Disp: 90 tablet, Rfl: 1   traMADol (ULTRAM) 50 MG tablet, Take 1 tablet (50 mg total) by mouth every 6 (six) hours as needed., Disp: 60 tablet, Rfl: 0   Allergies: No Known Allergies  REVIEW OF SYSTEMS:   Review of Systems  Constitutional:  Negative for chills, fatigue and fever.  HENT:   Negative for lump/mass, mouth sores, nosebleeds, sore throat and trouble swallowing.    Eyes:  Negative for eye problems.  Respiratory:  Negative for cough and shortness of breath.   Cardiovascular:  Negative for chest pain, leg swelling and palpitations.  Gastrointestinal:  Negative for abdominal pain, constipation, diarrhea, nausea and vomiting.  Genitourinary:  Negative for bladder incontinence, difficulty urinating, dysuria, frequency, hematuria and nocturia.   Musculoskeletal:  Negative for arthralgias, back pain, flank pain, myalgias and neck pain.  Skin:  Negative for itching and rash.  Neurological:  Negative for dizziness, headaches and numbness.  Hematological:  Does not bruise/bleed easily.  Psychiatric/Behavioral:  Negative for depression, sleep disturbance and suicidal ideas. The patient is not nervous/anxious.   All other systems reviewed and are negative.    VITALS:   Blood pressure (!) 145/80, pulse 79, temperature 97.9 F (36.6 C), temperature source Oral, resp. rate 18, height 5\' 9"  (1.753 m), weight 235 lb 4.8 oz (106.7 kg), SpO2 98 %.  Wt Readings from Last 3 Encounters:  06/09/22 235 lb 4.8 oz (106.7 kg)  05/12/22 233 lb 3.2 oz (105.8 kg)  04/14/22 235 lb (106.6 kg)    Body mass index is 34.75 kg/m.  Performance status (ECOG): 1 - Symptomatic but completely ambulatory  PHYSICAL EXAM:   Physical Exam Vitals and nursing note reviewed. Exam conducted with a chaperone present.  Constitutional:      Appearance: Normal appearance.  Cardiovascular:     Rate and Rhythm: Normal rate and regular rhythm.     Pulses: Normal pulses.     Heart sounds: Normal heart sounds.  Pulmonary:     Effort: Pulmonary effort is normal.  Breath sounds: Normal breath sounds.  Abdominal:     Palpations: Abdomen is soft. There is no hepatomegaly, splenomegaly or mass.     Tenderness: There is no abdominal tenderness.  Musculoskeletal:     Right lower leg: No edema.     Left lower leg: No edema.  Lymphadenopathy:     Cervical: No cervical adenopathy.     Right  cervical: No superficial, deep or posterior cervical adenopathy.    Left cervical: No superficial, deep or posterior cervical adenopathy.     Upper Body:     Right upper body: No supraclavicular or axillary adenopathy.     Left upper body: No supraclavicular or axillary adenopathy.  Neurological:     General: No focal deficit present.     Mental Status: He is alert and oriented to person, place, and time.  Psychiatric:        Mood and Affect: Mood normal.        Behavior: Behavior normal.     LABS:      Latest Ref Rng & Units 06/09/2022   12:36 PM 05/12/2022   12:27 PM 04/14/2022   11:13 AM  CBC  WBC 4.0 - 10.5 K/uL 4.0  4.4  4.8   Hemoglobin 13.0 - 17.0 g/dL 09.8  11.9  14.7   Hematocrit 39.0 - 52.0 % 41.4  42.4  41.6   Platelets 150 - 400 K/uL 226  208  222       Latest Ref Rng & Units 06/09/2022   12:36 PM 05/12/2022   12:27 PM 04/14/2022   11:13 AM  CMP  Glucose 70 - 99 mg/dL 829  562  130   BUN 8 - 23 mg/dL 12  13  12    Creatinine 0.61 - 1.24 mg/dL 8.65  7.84  6.96   Sodium 135 - 145 mmol/L 137  137  137   Potassium 3.5 - 5.1 mmol/L 3.7  3.7  3.8   Chloride 98 - 111 mmol/L 102  102  102   CO2 22 - 32 mmol/L 22  26  27    Calcium 8.9 - 10.3 mg/dL 8.8  9.0  9.2   Total Protein 6.5 - 8.1 g/dL 6.8  7.1  7.0   Total Bilirubin 0.3 - 1.2 mg/dL 0.6  0.9  0.7   Alkaline Phos 38 - 126 U/L 58  59  58   AST 15 - 41 U/L 27  32  25   ALT 0 - 44 U/L 26  28  26       No results found for: "CEA1", "CEA" / No results found for: "CEA1", "CEA" Lab Results  Component Value Date   PSA1 0.5 07/16/2020   No results found for: "EXB284" No results found for: "CAN125"  Lab Results  Component Value Date   TOTALPROTELP 6.3 04/14/2022   ALBUMINELP 3.7 04/14/2022   A1GS 0.2 04/14/2022   A2GS 0.7 04/14/2022   BETS 1.0 04/14/2022   BETA2SER 0.3 12/07/2018   GAMS 0.8 04/14/2022   MSPIKE Not Observed 04/14/2022   SPEI Comment 04/14/2022   Lab Results  Component Value Date   FERRITIN 210  10/05/2017   Lab Results  Component Value Date   LDH 116 06/09/2022   LDH 106 03/10/2022   LDH 104 02/17/2022     STUDIES:   No results found.

## 2022-06-09 ENCOUNTER — Encounter: Payer: Self-pay | Admitting: Hematology

## 2022-06-09 ENCOUNTER — Inpatient Hospital Stay: Payer: Medicare Other

## 2022-06-09 ENCOUNTER — Inpatient Hospital Stay: Payer: Medicare Other | Attending: Hematology | Admitting: Hematology

## 2022-06-09 ENCOUNTER — Ambulatory Visit (HOSPITAL_COMMUNITY)
Admission: RE | Admit: 2022-06-09 | Discharge: 2022-06-09 | Disposition: A | Discharge status: Home / Self Care | Payer: Medicare Other | Source: Ambulatory Visit | Attending: Hematology | Admitting: Hematology

## 2022-06-09 VITALS — BP 145/80 | HR 79 | Temp 97.9°F | Resp 18 | Ht 69.0 in | Wt 235.3 lb

## 2022-06-09 DIAGNOSIS — C9000 Multiple myeloma not having achieved remission: Secondary | ICD-10-CM

## 2022-06-09 DIAGNOSIS — M25562 Pain in left knee: Secondary | ICD-10-CM | POA: Insufficient documentation

## 2022-06-09 DIAGNOSIS — R918 Other nonspecific abnormal finding of lung field: Secondary | ICD-10-CM | POA: Insufficient documentation

## 2022-06-09 DIAGNOSIS — Z5112 Encounter for antineoplastic immunotherapy: Secondary | ICD-10-CM | POA: Insufficient documentation

## 2022-06-09 LAB — COMPREHENSIVE METABOLIC PANEL
ALT: 26 U/L (ref 0–44)
AST: 27 U/L (ref 15–41)
Albumin: 3.8 g/dL (ref 3.5–5.0)
Alkaline Phosphatase: 58 U/L (ref 38–126)
Anion gap: 13 (ref 5–15)
BUN: 12 mg/dL (ref 8–23)
CO2: 22 mmol/L (ref 22–32)
Calcium: 8.8 mg/dL — ABNORMAL LOW (ref 8.9–10.3)
Chloride: 102 mmol/L (ref 98–111)
Creatinine, Ser: 1.02 mg/dL (ref 0.61–1.24)
GFR, Estimated: >60 mL/min (ref >60)
Glucose, Bld: 153 mg/dL — ABNORMAL HIGH (ref 70–99)
Potassium: 3.7 mmol/L (ref 3.5–5.1)
Sodium: 137 mmol/L (ref 135–145)
Total Bilirubin: 0.6 mg/dL (ref 0.3–1.2)
Total Protein: 6.8 g/dL (ref 6.5–8.1)

## 2022-06-09 LAB — CBC WITH DIFFERENTIAL/PLATELET
Abs Immature Granulocytes: 0.02 10*3/uL (ref 0.00–0.07)
Basophils Absolute: 0 10*3/uL (ref 0.0–0.1)
Basophils Relative: 1 %
Eosinophils Absolute: 0.2 10*3/uL (ref 0.0–0.5)
Eosinophils Relative: 4 %
HCT: 41.4 % (ref 39.0–52.0)
Hemoglobin: 13.8 g/dL (ref 13.0–17.0)
Immature Granulocytes: 1 %
Lymphocytes Relative: 54 %
Lymphs Abs: 2.2 10*3/uL (ref 0.7–4.0)
MCH: 32.5 pg (ref 26.0–34.0)
MCHC: 33.3 g/dL (ref 30.0–36.0)
MCV: 97.6 fL (ref 80.0–100.0)
Monocytes Absolute: 0.3 10*3/uL (ref 0.1–1.0)
Monocytes Relative: 8 %
Neutro Abs: 1.3 10*3/uL — ABNORMAL LOW (ref 1.7–7.7)
Neutrophils Relative %: 32 %
Platelets: 226 10*3/uL (ref 150–400)
RBC: 4.24 MIL/uL (ref 4.22–5.81)
RDW: 13.2 % (ref 11.5–15.5)
WBC: 4 10*3/uL (ref 4.0–10.5)
nRBC: 0 % (ref 0.0–0.2)

## 2022-06-09 LAB — LACTATE DEHYDROGENASE: LDH: 116 U/L (ref 98–192)

## 2022-06-09 LAB — MAGNESIUM: Magnesium: 1.8 mg/dL (ref 1.7–2.4)

## 2022-06-09 MED ORDER — DARATUMUMAB-HYALURONIDASE-FIHJ 1800-30000 MG-UT/15ML ~~LOC~~ SOLN
1800.0000 mg | Freq: Once | SUBCUTANEOUS | Status: AC
  Administered 2022-06-09: 1800 mg via SUBCUTANEOUS
  Filled 2022-06-09: qty 15

## 2022-06-09 MED ORDER — ZOLEDRONIC ACID 4 MG/100ML IV SOLN
4.0000 mg | Freq: Once | INTRAVENOUS | Status: AC
  Administered 2022-06-09: 4 mg via INTRAVENOUS
  Filled 2022-06-09: qty 100

## 2022-06-09 MED ORDER — SODIUM CHLORIDE 0.9 % IV SOLN: Freq: Once | INTRAVENOUS | Status: AC

## 2022-06-09 MED ORDER — CETIRIZINE HCL 10 MG PO TABS
10.0000 mg | ORAL_TABLET | Freq: Once | ORAL | Status: AC
  Administered 2022-06-09: 10 mg via ORAL
  Filled 2022-06-09: qty 1

## 2022-06-09 NOTE — Patient Instructions (Signed)
[facility] at [facility] Discharge Instructions   You were seen and examined today by Dr. Katragadda.  He reviewed the results of your lab work which are normal/stable.   We will proceed with your treatment today.  Return as scheduled.    Thank you for choosing  [facility] to provide your oncology and hematology care.  To afford each patient quality time with our provider, please arrive at least 15 minutes before your scheduled appointment time.   If you have a lab appointment with the Cancer Center please come in thru the Main Entrance and check in at the main information desk.  You need to re-schedule your appointment should you arrive 10 or more minutes late.  We strive to give you quality time with our providers, and arriving late affects you and other patients whose appointments are after yours.  Also, if you no show three or more times for appointments you may be dismissed from the clinic at the providers discretion.     Again, thank you for choosing [facility].  Our hope is that these requests will decrease the amount of time that you wait before being seen by our physicians.       _____________________________________________________________  Should you have questions after your visit to [facility], please contact our office at [phone] and follow the prompts.  Our office hours are 8:00 a.m. and 4:30 p.m. Monday - Friday.  Please note that voicemails left after 4:00 p.m. may not be returned until the following business day.  We are closed weekends and major holidays.  You do have access to a nurse 24-7, just call the main number to the clinic [phone] and do not press any options, hold on the line and a nurse will answer the phone.    For prescription refill requests, have your pharmacy contact our office and allow 72 hours.    Due to Covid, you will need to wear a mask upon entering  the hospital. If you do not have a mask, a mask will be given to you at the Main Entrance upon arrival. For doctor visits, patients may have 1 support person age 18 or older with them. For treatment visits, patients can not have anyone with them due to social distancing guidelines and our immunocompromised population.      

## 2022-06-09 NOTE — Progress Notes (Signed)
Patient presents today for chemotherapy injection.  Patient is in satisfactory condition with no new complaints voiced.  Vital signs are stable.  Labs reviewed by Dr. Ellin Saba during the office visit and all labs are within treatment parameters. IV placed in R hand.  IV flushed well with good blood return noted.  We will proceed with treatment per MD orders.   Patient tolerated treatment well with no complaints voiced.  Patient left ambulatory with cane in stable condition.  Vital signs stable at discharge.  Follow up as scheduled.

## 2022-06-09 NOTE — Progress Notes (Signed)
Patient is taking Revlimid as prescribed.  He has not missed any doses and reports no side effects at this time.    Patient has been examined by Dr. Ellin Saba. Vital signs and labs have been reviewed by MD - ANC (1.3), Creatinine, LFTs, hemoglobin, and platelets are within treatment parameters per M.D. - pt may proceed with treatment.  Primary RN and pharmacy notified.

## 2022-06-09 NOTE — Patient Instructions (Signed)
[facility]  Discharge Instructions: Thank you for choosing [facility] to provide your oncology and hematology care.  If you have a lab appointment with the Cancer Center - please note that after April 8th, 2024, all labs will be drawn in the cancer center.  You do not have to check in or register with the main entrance as you have in the past but will complete your check-in in the cancer center.  Wear comfortable clothing and clothing appropriate for easy access to any Portacath or PICC line.   We strive to give you quality time with your provider. You may need to reschedule your appointment if you arrive late (15 or more minutes).  Arriving late affects you and other patients whose appointments are after yours.  Also, if you miss three or more appointments without notifying the office, you may be dismissed from the clinic at the provider's discretion.      For prescription refill requests, have your pharmacy contact our office and allow 72 hours for refills to be completed.    Today you received the following chemotherapy and/or immunotherapy agents Daratumumab/Zometa.   Daratumumab; Hyaluronidase Injection What is this medication? DARATUMUMAB; HYALURONIDASE (dar a toom ue mab; hye al ur ON i dase) treats multiple myeloma, a type of bone marrow cancer. Daratumumab works by blocking a protein that causes cancer cells to grow and multiply. This helps to slow or stop the spread of cancer cells. Hyaluronidase works by increasing the absorption of other medications in the body to help them work better. This medication may also be used treat amyloidosis, a condition that causes the buildup of a protein (amyloid) in your body. It works by reducing the buildup of this protein, which decreases symptoms. It is a combination medication that contains a monoclonal antibody. This medicine may be used for other purposes; ask your health care provider or pharmacist if you have  questions. COMMON BRAND NAME(S): DARZALEX FASPRO What should I tell my care team before I take this medication? They need to know if you have any of these conditions: Heart disease Infection, such as chickenpox, cold sores, herpes, hepatitis B Lung or breathing disease An unusual or allergic reaction to daratumumab, hyaluronidase, other medications, foods, dyes, or preservatives Pregnant or trying to get pregnant Breast-feeding How should I use this medication? This medication is injected under the skin. It is given by your care team in a hospital or clinic setting. Talk to your care team about the use of this medication in children. Special care may be needed. Overdosage: If you think you have taken too much of this medicine contact a poison control center or emergency room at once. NOTE: This medicine is only for you. Do not share this medicine with others. What if I miss a dose? Keep appointments for follow-up doses. It is important not to miss your dose. Call your care team if you are unable to keep an appointment. What may interact with this medication? Interactions have not been studied. This list may not describe all possible interactions. Give your health care provider a list of all the medicines, herbs, non-prescription drugs, or dietary supplements you use. Also tell them if you smoke, drink alcohol, or use illegal drugs. Some items may interact with your medicine. What should I watch for while using this medication? Your condition will be monitored carefully while you are receiving this medication. This medication can cause serious allergic reactions. To reduce your risk, your care team  may give you other medication to take before receiving this one. Be sure to follow the directions from your care team. This medication can affect the results of blood tests to match your blood type. These changes can last for up to 6 months after the final dose. Your care team will do blood tests to  match your blood type before you start treatment. Tell all of your care team that you are being treated with this medication before receiving a blood transfusion. This medication can affect the results of some tests used to determine treatment response; extra tests may be needed to evaluate response. Talk to your care team if you wish to become pregnant or think you are pregnant. This medication can cause serious birth defects if taken during pregnancy and for 3 months after the last dose. A reliable form of contraception is recommended while taking this medication and for 3 months after the last dose. Talk to your care team about effective forms of contraception. Do not breast-feed while taking this medication. What side effects may I notice from receiving this medication? Side effects that you should report to your care team as soon as possible: Allergic reactions--skin rash, itching, hives, swelling of the face, lips, tongue, or throat Heart rhythm changes--fast or irregular heartbeat, dizziness, feeling faint or lightheaded, chest pain, trouble breathing Infection--fever, chills, cough, sore throat, wounds that don't heal, pain or trouble when passing urine, general feeling of discomfort or being unwell Infusion reactions--chest pain, shortness of breath or trouble breathing, feeling faint or lightheaded Sudden eye pain or change in vision such as blurry vision, seeing halos around lights, vision loss Unusual bruising or bleeding Side effects that usually do not require medical attention (report to your care team if they continue or are bothersome): Constipation Diarrhea Fatigue Nausea Pain, tingling, or numbness in the hands or feet Swelling of the ankles, hands, or feet This list may not describe all possible side effects. Call your doctor for medical advice about side effects. You may report side effects to FDA at 1-800-FDA-1088. Where should I keep my medication? This medication is given  in a hospital or clinic. It will not be stored at home. NOTE: This sheet is a summary. It may not cover all possible information. If you have questions about this medicine, talk to your doctor, pharmacist, or health care provider.  2024 Elsevier/Gold Standard (2021-05-06 00:00:00)    Zoledronic Acid Injection (Cancer) What is this medication? ZOLEDRONIC ACID (ZOE le dron ik AS id) treats high calcium levels in the blood caused by cancer. It may also be used with chemotherapy to treat weakened bones caused by cancer. It works by slowing down the release of calcium from bones. This lowers calcium levels in your blood. It also makes your bones stronger and less likely to break (fracture). It belongs to a group of medications called bisphosphonates. This medicine may be used for other purposes; ask your health care provider or pharmacist if you have questions. COMMON BRAND NAME(S): Zometa, Zometa Powder What should I tell my care team before I take this medication? They need to know if you have any of these conditions: Dehydration Dental disease Kidney disease Liver disease Low levels of calcium in the blood Lung or breathing disease, such as asthma Receiving steroids, such as dexamethasone or prednisone An unusual or allergic reaction to zoledronic acid, other medications, foods, dyes, or preservatives Pregnant or trying to get pregnant Breast-feeding How should I use this medication? This medication is injected into  a vein. It is given by your care team in a hospital or clinic setting. Talk to your care team about the use of this medication in children. Special care may be needed. Overdosage: If you think you have taken too much of this medicine contact a poison control center or emergency room at once. NOTE: This medicine is only for you. Do not share this medicine with others. What if I miss a dose? Keep appointments for follow-up doses. It is important not to miss your dose. Call your  care team if you are unable to keep an appointment. What may interact with this medication? Certain antibiotics given by injection Diuretics, such as bumetanide, furosemide NSAIDs, medications for pain and inflammation, such as ibuprofen or naproxen Teriparatide Thalidomide This list may not describe all possible interactions. Give your health care provider a list of all the medicines, herbs, non-prescription drugs, or dietary supplements you use. Also tell them if you smoke, drink alcohol, or use illegal drugs. Some items may interact with your medicine. What should I watch for while using this medication? Visit your care team for regular checks on your progress. It may be some time before you see the benefit from this medication. Some people who take this medication have severe bone, joint, or muscle pain. This medication may also increase your risk for jaw problems or a broken thigh bone. Tell your care team right away if you have severe pain in your jaw, bones, joints, or muscles. Tell you care team if you have any pain that does not go away or that gets worse. Tell your dentist and dental surgeon that you are taking this medication. You should not have major dental surgery while on this medication. See your dentist to have a dental exam and fix any dental problems before starting this medication. Take good care of your teeth while on this medication. Make sure you see your dentist for regular follow-up appointments. You should make sure you get enough calcium and vitamin D while you are taking this medication. Discuss the foods you eat and the vitamins you take with your care team. Check with your care team if you have severe diarrhea, nausea, and vomiting, or if you sweat a lot. The loss of too much body fluid may make it dangerous for you to take this medication. You may need bloodwork while taking this medication. Talk to your care team if you wish to become pregnant or think you might be  pregnant. This medication can cause serious birth defects. What side effects may I notice from receiving this medication? Side effects that you should report to your care team as soon as possible: Allergic reactions--skin rash, itching, hives, swelling of the face, lips, tongue, or throat Kidney injury--decrease in the amount of urine, swelling of the ankles, hands, or feet Low calcium level--muscle pain or cramps, confusion, tingling, or numbness in the hands or feet Osteonecrosis of the jaw--pain, swelling, or redness in the mouth, numbness of the jaw, poor healing after dental work, unusual discharge from the mouth, visible bones in the mouth Severe bone, joint, or muscle pain Side effects that usually do not require medical attention (report to your care team if they continue or are bothersome): Constipation Fatigue Fever Loss of appetite Nausea Stomach pain This list may not describe all possible side effects. Call your doctor for medical advice about side effects. You may report side effects to FDA at 1-800-FDA-1088. Where should I keep my medication? This medication is given in  a hospital or clinic. It will not be stored at home. NOTE: This sheet is a summary. It may not cover all possible information. If you have questions about this medicine, talk to your doctor, pharmacist, or health care provider.  2024 Elsevier/Gold Standard (2021-02-21 00:00:00)        To help prevent nausea and vomiting after your treatment, we encourage you to take your nausea medication as directed.  BELOW ARE SYMPTOMS THAT SHOULD BE REPORTED IMMEDIATELY: *FEVER GREATER THAN 100.4 F (38 C) OR HIGHER *CHILLS OR SWEATING *NAUSEA AND VOMITING THAT IS NOT CONTROLLED WITH YOUR NAUSEA MEDICATION *UNUSUAL SHORTNESS OF BREATH *UNUSUAL BRUISING OR BLEEDING *URINARY PROBLEMS (pain or burning when urinating, or frequent urination) *BOWEL PROBLEMS (unusual diarrhea, constipation, pain near the anus) TENDERNESS  IN MOUTH AND THROAT WITH OR WITHOUT PRESENCE OF ULCERS (sore throat, sores in mouth, or a toothache) UNUSUAL RASH, SWELLING OR PAIN  UNUSUAL VAGINAL DISCHARGE OR ITCHING   Items with * indicate a potential emergency and should be followed up as soon as possible or go to the Emergency Department if any problems should occur.  Please show the CHEMOTHERAPY ALERT CARD or IMMUNOTHERAPY ALERT CARD at check-in to the Emergency Department and triage nurse.  Should you have questions after your visit or need to cancel or reschedule your appointment, please contact [facility] [phone]  and follow the prompts.  Office hours are 8:00 a.m. to 4:30 p.m. Monday - Friday. Please note that voicemails left after 4:00 p.m. may not be returned until the following business day.  We are closed weekends and major holidays. You have access to a nurse at all times for urgent questions. Please call the main number to the clinic [phone] and follow the prompts.  For any non-urgent questions, you may also contact your provider using MyChart. We now offer e-Visits for anyone 83 and older to request care online for non-urgent symptoms. For details visit [email or web address].   Also download the MyChart app! Go to the app store, search "MyChart", open the app, select [place], and log in with your MyChart username and password.

## 2022-06-10 ENCOUNTER — Encounter: Payer: Self-pay | Admitting: Orthopaedic Surgery

## 2022-06-10 ENCOUNTER — Ambulatory Visit (INDEPENDENT_AMBULATORY_CARE_PROVIDER_SITE_OTHER): Payer: Medicare Other | Admitting: Orthopaedic Surgery

## 2022-06-10 VITALS — BP 146/84 | HR 82 | Ht 69.0 in | Wt 235.0 lb

## 2022-06-10 DIAGNOSIS — M25562 Pain in left knee: Secondary | ICD-10-CM | POA: Diagnosis not present

## 2022-06-10 DIAGNOSIS — C9000 Multiple myeloma not having achieved remission: Secondary | ICD-10-CM

## 2022-06-10 LAB — KAPPA/LAMBDA LIGHT CHAINS
Kappa free light chain: 17.8 mg/L (ref 3.3–19.4)
Kappa, lambda light chain ratio: 1.55 (ref 0.26–1.65)
Lambda free light chains: 11.5 mg/L (ref 5.7–26.3)

## 2022-06-10 NOTE — Progress Notes (Signed)
My left knee hurt  He had pain in the left knee over the weekend last week.  He had swelling medially.  It got better two days ago and is not hurting at all today.  He is walking well today without pain.  He has multiple myeloma.  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  He has no new lesions that he knows of.  He was evaluated last month at [facility].  I am concerned that the knee pain could be from the hip, femur or back.  But he has no pain in those areas and full ROM of the knee, left hip and back.  He has no redness or swelling.  Encounter Diagnoses  Name Primary?   Acute pain of left knee Yes   Multiple myeloma without remission (HCC)    I will see him in one week.  If he is feeling well, he can call and cancel.  He needs to let the doctors at [facility] know of this situation.  Call if any problem.  Precautions discussed.  Electronically Signed Darreld Mclean, MD 5/29/20249:46 AM

## 2022-06-11 ENCOUNTER — Other Ambulatory Visit: Payer: Self-pay

## 2022-06-11 ENCOUNTER — Ambulatory Visit: Payer: Self-pay | Admitting: Internal Medicine

## 2022-06-12 LAB — PROTEIN ELECTROPHORESIS, SERUM
A/G Ratio: 1.4 (ref 0.7–1.7)
Albumin ELP: 3.6 g/dL (ref 2.9–4.4)
Alpha-1-Globulin: 0.2 g/dL (ref 0.0–0.4)
Alpha-2-Globulin: 0.6 g/dL (ref 0.4–1.0)
Beta Globulin: 1 g/dL (ref 0.7–1.3)
Gamma Globulin: 0.7 g/dL (ref 0.4–1.8)
Globulin, Total: 2.5 g/dL (ref 2.2–3.9)
Total Protein ELP: 6.1 g/dL (ref 6.0–8.5)

## 2022-06-13 ENCOUNTER — Other Ambulatory Visit: Payer: Self-pay

## 2022-06-15 LAB — IFE, DARA-SPECIFIC, SERUM
IgA: 81 mg/dL (ref 61–437)
IgG (Immunoglobin G), Serum: 758 mg/dL (ref 603–1613)
IgM (Immunoglobulin M), Srm: 33 mg/dL (ref 20–172)

## 2022-06-17 ENCOUNTER — Other Ambulatory Visit: Payer: Self-pay | Admitting: Hematology

## 2022-06-17 ENCOUNTER — Ambulatory Visit: Payer: BLUE CROSS/BLUE SHIELD | Admitting: Orthopaedic Surgery

## 2022-06-17 NOTE — Telephone Encounter (Signed)
Chart reviewed. Revlimid refilled per last office note with Dr. Katragadda.  

## 2022-06-18 ENCOUNTER — Encounter: Payer: Self-pay | Admitting: Internal Medicine

## 2022-06-18 ENCOUNTER — Ambulatory Visit (INDEPENDENT_AMBULATORY_CARE_PROVIDER_SITE_OTHER): Payer: Medicare Other | Admitting: Internal Medicine

## 2022-06-18 VITALS — BP 129/82 | HR 69 | Ht 69.0 in | Wt 229.6 lb

## 2022-06-18 DIAGNOSIS — I10 Essential (primary) hypertension: Secondary | ICD-10-CM

## 2022-06-18 DIAGNOSIS — C9000 Multiple myeloma not having achieved remission: Secondary | ICD-10-CM

## 2022-06-18 DIAGNOSIS — R739 Hyperglycemia, unspecified: Secondary | ICD-10-CM | POA: Diagnosis not present

## 2022-06-18 DIAGNOSIS — I2510 Atherosclerotic heart disease of native coronary artery without angina pectoris: Secondary | ICD-10-CM | POA: Diagnosis not present

## 2022-06-18 DIAGNOSIS — M25562 Pain in left knee: Secondary | ICD-10-CM

## 2022-06-18 DIAGNOSIS — D649 Anemia, unspecified: Secondary | ICD-10-CM

## 2022-06-18 DIAGNOSIS — E782 Mixed hyperlipidemia: Secondary | ICD-10-CM

## 2022-06-18 NOTE — Progress Notes (Signed)
Established Patient Office Visit  Subjective:  Patient ID: Eugene [name], male    DOB: [birth year]  Age: 67 y.o. MRN: [id number]  CC:  Chief Complaint  Patient presents with   Hypertension    Six month follow up     HPI Eugene [name] is a 67 y.o. male with past medical history of CAD, HTN, multiple myeloma and obesity who presents for f/u of his chronic medical conditions.  HTN: BP is well-controlled. Takes medications regularly. Patient denies headache, dizziness, chest pain, dyspnea or palpitations.  CAD: Takes Aspirin and Metoprolol. Does not take Crestor regularly, advised to take it at least QOD.   MM: Followed by Oncology, undergoing chemotherapy. Has had stem cell collection at [facility].  He complains of left knee pain for the last 10 days.  Denies any injury or fall.  He had orthopedic surgery evaluation and has been using knee brace with some relief.  He has tried taking Tylenol with some relief as well.  Denies any numbness or tingling of the LE. He has right thigh lesion from multiple myeloma, but denies any right thigh pain.  Past Medical History:  Diagnosis Date   Back pain    CAD (coronary artery disease)    a. 01/2017 NSTEMI/Cath: LM nl, LAD 25p - ? hypodense but no filling defect, RI nl, LCX nl, RCA nl, EF 55-65%-->Med Rx.   Erectile dysfunction    History of echocardiogram    a. 01/2017 Echo: EF 55-60%, no rwma.   Hypertension     Past Surgical History:  Procedure Laterality Date   LEFT HEART CATH AND CORONARY ANGIOGRAPHY N/A 01/15/2017   Procedure: LEFT HEART CATH AND CORONARY ANGIOGRAPHY;  Surgeon: [place], Peter M, MD;  Location: [facility] INVASIVE CV LAB;  Service: Cardiovascular;  Laterality: N/A;   ORIF trocanteric femoral IM Nail Right     Family History  Problem Relation Age of Onset   Cancer Mother        breast   Diabetes Mother    Diabetes Sister    Hypertension Sister    Cancer Brother    Mental illness Sister     Social History    Socioeconomic History   Marital status: Significant Other    Spouse name: Not on file   Number of children: Not on file   Years of education: Not on file   Highest education level: Not on file  Occupational History   Not on file  Tobacco Use   Smoking status: Never   Smokeless tobacco: Never  Vaping Use   Vaping Use: Never used  Substance and Sexual Activity   Alcohol use: No   Drug use: No   Sexual activity: Yes  Other Topics Concern   Not on file  Social History Narrative   Not on file   Social Determinants of Health   Financial Resource Strain: Low Risk  (06/26/2020)   Overall Financial Resource Strain (CARDIA)    Difficulty of Paying Living Expenses: Not hard at all  Food Insecurity: No Food Insecurity (06/26/2020)   Hunger Vital Sign    Worried About Running Out of Food in the Last Year: Never true    Ran Out of Food in the Last Year: Never true  Transportation Needs: No Transportation Needs (06/26/2020)   PRAPARE - Administrator, Civil Service (Medical): No    Lack of Transportation (Non-Medical): No  Physical Activity: Insufficiently Active (06/26/2020)   Exercise Vital Sign    Days of  Exercise per Week: 2 days    Minutes of Exercise per Session: 10 min  Stress: No Stress Concern Present (06/26/2020)   [employer] of Occupational Health - Occupational Stress Questionnaire    Feeling of Stress : Not at all  Social Connections: Moderately Integrated (06/26/2020)   Social Connection and Isolation Panel [NHANES]    Frequency of Communication with Friends and Family: More than three times a week    Frequency of Social Gatherings with Friends and Family: More than three times a week    Attends Religious Services: 1 to 4 times per year    Active Member of [employer] or Organizations: No    Attends Banker Meetings: 1 to 4 times per year    Marital Status: Divorced  Catering manager Violence: Not At Risk (06/26/2020)   Humiliation, Afraid,  Rape, and Kick questionnaire    Fear of Current or Ex-Partner: No    Emotionally Abused: No    Physically Abused: No    Sexually Abused: No    Outpatient Medications Prior to Visit  Medication Sig Dispense Refill   acyclovir (ZOVIRAX) 400 MG tablet Take 1 tablet (400 mg total) by mouth 2 (two) times daily. 60 tablet 5   aspirin EC 81 MG tablet Take by mouth.     calcium-vitamin D (OSCAL WITH D) 500-200 MG-UNIT tablet Take 1 tablet by mouth.     dexamethasone (DECADRON) 4 MG tablet Take 10 tablets (40 mg total) by mouth once a week. 40 tablet 6   filgrastim-aafi (NIVESTYM) 480 MCG/0.8ML SOSY injection Inject 2 - 480 mcg syringes under the skin daily for 7 days to equal a total daily dose of 960 mcg (Patient not taking: Reported on 06/10/2022) 11.2 mL 0   lenalidomide (REVLIMID) 10 MG capsule TAKE 1 CAPSULE BY MOUTH 1 TIME A DAY FOR 21 DAYS ON THEN 7 DAYS OFF 21 capsule 0   metoprolol succinate (TOPROL-XL) 25 MG 24 hr tablet Take 1 tablet (25 mg total) by mouth daily. 90 tablet 3   Multiple Vitamin (MULTIVITAMIN) tablet Take 1 tablet by mouth daily.       Multiple Vitamins/Iron TABS Take by mouth.     nitroGLYCERIN (NITROSTAT) 0.4 MG SL tablet Place 1 tablet (0.4 mg total) under the tongue every 5 (five) minutes for 3 doses as needed for chest pain. 25 tablet 3   ondansetron (ZOFRAN) 8 MG tablet  (Patient not taking: Reported on 06/10/2022)     prochlorperazine (COMPAZINE) 10 MG tablet  (Patient not taking: Reported on 06/10/2022)     rosuvastatin (CRESTOR) 40 MG tablet Take 1 tablet (40 mg total) by mouth every other day. (Patient not taking: Reported on 06/10/2022) 90 tablet 1   traMADol (ULTRAM) 50 MG tablet Take 1 tablet (50 mg total) by mouth every 6 (six) hours as needed. 60 tablet 0   Chlorphen-PE-Acetaminophen (NOREL AD) 4-10-325 MG TABS Take 1 each by mouth 3 (three) times daily as needed (Nasal congestion). (Patient not taking: Reported on 06/10/2022) 30 tablet 0   No  facility-administered medications prior to visit.    No Known Allergies  ROS Review of Systems  Constitutional:  Positive for fatigue. Negative for chills and fever.  HENT:  Negative for congestion and sore throat.   Eyes:  Negative for pain and discharge.  Respiratory:  Negative for cough and shortness of breath.   Cardiovascular:  Negative for chest pain and palpitations.  Gastrointestinal:  Negative for constipation, diarrhea, nausea and vomiting.  Endocrine: Negative for polydipsia and polyuria.  Genitourinary:  Negative for dysuria and hematuria.  Musculoskeletal:  Positive for arthralgias. Negative for neck pain and neck stiffness.  Skin:  Negative for rash.  Neurological:  Negative for dizziness, weakness, numbness and headaches.  Psychiatric/Behavioral:  Negative for agitation and behavioral problems.       Objective:    Physical Exam Vitals reviewed.  Constitutional:      General: He is not in acute distress.    Appearance: He is obese. He is not diaphoretic.  HENT:     Head: Normocephalic and atraumatic.     Nose: Nose normal.     Mouth/Throat:     Mouth: Mucous membranes are moist.  Eyes:     General: No scleral icterus.    Extraocular Movements: Extraocular movements intact.  Cardiovascular:     Rate and Rhythm: Normal rate and regular rhythm.     Pulses: Normal pulses.     Heart sounds: Normal heart sounds. No murmur heard. Pulmonary:     Breath sounds: Normal breath sounds. No wheezing or rales.  Musculoskeletal:     Cervical back: Neck supple. No tenderness.     Right lower leg: No edema.     Left lower leg: No edema.  Skin:    General: Skin is warm.     Findings: No rash.  Neurological:     General: No focal deficit present.     Mental Status: He is alert and oriented to person, place, and time.     Sensory: No sensory deficit.     Motor: No weakness.  Psychiatric:        Mood and Affect: Mood normal.        Behavior: Behavior normal.      BP 129/82 (BP Location: Right Arm, Patient Position: Sitting, Cuff Size: Large)   Pulse 69   Ht 5\' 9"  (1.753 m)   Wt 229 lb 9.6 oz (104.1 kg)   SpO2 96%   BMI 33.91 kg/m  Wt Readings from Last 3 Encounters:  06/18/22 229 lb 9.6 oz (104.1 kg)  06/10/22 235 lb (106.6 kg)  06/09/22 235 lb 4.8 oz (106.7 kg)    Lab Results  Component Value Date   TSH 2.140 07/16/2020   Lab Results  Component Value Date   WBC 4.0 06/09/2022   HGB 13.8 06/09/2022   HCT 41.4 06/09/2022   MCV 97.6 06/09/2022   PLT 226 06/09/2022   Lab Results  Component Value Date   NA 137 06/09/2022   K 3.7 06/09/2022   CO2 22 06/09/2022   GLUCOSE 153 (H) 06/09/2022   BUN 12 06/09/2022   CREATININE 1.02 06/09/2022   BILITOT 0.6 06/09/2022   ALKPHOS 58 06/09/2022   AST 27 06/09/2022   ALT 26 06/09/2022   PROT 6.8 06/09/2022   ALBUMIN 3.8 06/09/2022   CALCIUM 8.8 (L) 06/09/2022   ANIONGAP 13 06/09/2022   Lab Results  Component Value Date   CHOL 159 06/10/2021   Lab Results  Component Value Date   HDL 49 06/10/2021   Lab Results  Component Value Date   LDLCALC 92 06/10/2021   Lab Results  Component Value Date   TRIG 90 06/10/2021   Lab Results  Component Value Date   CHOLHDL 3.2 06/10/2021   Lab Results  Component Value Date   HGBA1C 4.9 06/10/2021      Assessment & Plan:   Problem List Items Addressed This Visit  Cardiovascular and Mediastinum   CAD (coronary artery disease)    Has had cardiac cath in the past, no stents On Aspirin, statin and B-blocker Counseled again to stay compliant to statin therapy F/u with Cardiology      Relevant Orders   TSH   Essential hypertension    BP Readings from Last 1 Encounters:  06/18/22 129/82  overall well-controlled with Metoprolol Counseled for compliance with the medications Advised DASH diet and moderate exercise/walking as tolerated      Relevant Orders   TSH     Other   Multiple myeloma without remission (HCC)  - Primary    Undergoing chemotherapy for MM S/p stem cell collection at [facility] Followed by Oncology      Mixed hyperlipidemia    Needs to take Crestor, at least QOD Check lipid profile      Relevant Orders   Lipid Profile   Acute pain of left knee    X-ray of left knee shows likely chronic tendinosis, but no major arthritic changes Continue to apply knee brace Tylenol arthritis as needed for pain      Other Visit Diagnoses     Hyperglycemia       Relevant Orders   Hemoglobin A1c      No orders of the defined types were placed in this encounter.   Follow-up: Return in about 6 months (around 12/18/2022) for Annual physical.    Anabel Halon, MD

## 2022-06-18 NOTE — Patient Instructions (Signed)
Please start taking Crestor every other day.  Please continue to take medications as prescribed.  Please continue to follow low carb diet and perform moderate exercise/walking at least 150 mins/week.  Please get blood tests done at your next Oncology blood test appointment.

## 2022-06-19 ENCOUNTER — Other Ambulatory Visit: Payer: Self-pay

## 2022-06-19 DIAGNOSIS — M25562 Pain in left knee: Secondary | ICD-10-CM | POA: Insufficient documentation

## 2022-06-19 NOTE — Assessment & Plan Note (Signed)
Has had cardiac cath in the past, no stents On Aspirin, statin and B-blocker Counseled again to stay compliant to statin therapy F/u with Cardiology 

## 2022-06-19 NOTE — Assessment & Plan Note (Signed)
BP Readings from Last 1 Encounters:  06/18/22 129/82   overall well-controlled with Metoprolol Counseled for compliance with the medications Advised DASH diet and moderate exercise/walking as tolerated

## 2022-06-19 NOTE — Assessment & Plan Note (Signed)
Needs to take Crestor, at least QOD ?Check lipid profile ?

## 2022-06-19 NOTE — Assessment & Plan Note (Signed)
X-ray of left knee shows likely chronic tendinosis, but no major arthritic changes Continue to apply knee brace Tylenol arthritis as needed for pain

## 2022-06-19 NOTE — Assessment & Plan Note (Signed)
Undergoing chemotherapy for MM ?S/p stem cell collection at [facility] ?Followed by Oncology ?

## 2022-06-30 ENCOUNTER — Other Ambulatory Visit (HOSPITAL_COMMUNITY)
Admission: RE | Admit: 2022-06-30 | Discharge: 2022-06-30 | Disposition: A | Discharge status: Home / Self Care | Payer: Medicare Other | Source: Ambulatory Visit | Attending: Internal Medicine | Admitting: Internal Medicine

## 2022-06-30 DIAGNOSIS — E782 Mixed hyperlipidemia: Secondary | ICD-10-CM | POA: Diagnosis present

## 2022-06-30 DIAGNOSIS — R739 Hyperglycemia, unspecified: Secondary | ICD-10-CM | POA: Insufficient documentation

## 2022-06-30 LAB — LIPID PANEL
Cholesterol: 150 mg/dL (ref 0–200)
HDL: 51 mg/dL (ref >40)
LDL Cholesterol: 87 mg/dL (ref 0–99)
Total CHOL/HDL Ratio: 2.9 RATIO
Triglycerides: 62 mg/dL (ref <150)
VLDL: 12 mg/dL (ref 0–40)

## 2022-06-30 LAB — HEMOGLOBIN A1C
Hgb A1c MFr Bld: 4.7 % — ABNORMAL LOW (ref 4.8–5.6)
Mean Plasma Glucose: 88.19 mg/dL

## 2022-06-30 LAB — TSH: TSH: 3.892 u[IU]/mL (ref 0.350–4.500)

## 2022-07-07 ENCOUNTER — Inpatient Hospital Stay: Payer: Medicare Other

## 2022-07-07 ENCOUNTER — Ambulatory Visit: Payer: BLUE CROSS/BLUE SHIELD | Admitting: Hematology

## 2022-07-07 ENCOUNTER — Inpatient Hospital Stay: Payer: Medicare Other | Attending: Hematology

## 2022-07-07 VITALS — BP 132/75 | HR 65 | Temp 97.6°F | Resp 18 | Ht 69.0 in | Wt 236.0 lb

## 2022-07-07 DIAGNOSIS — Z5112 Encounter for antineoplastic immunotherapy: Secondary | ICD-10-CM | POA: Diagnosis present

## 2022-07-07 DIAGNOSIS — C9000 Multiple myeloma not having achieved remission: Secondary | ICD-10-CM | POA: Diagnosis present

## 2022-07-07 LAB — CBC WITH DIFFERENTIAL/PLATELET
Abs Immature Granulocytes: 0 10*3/uL (ref 0.00–0.07)
Basophils Absolute: 0 10*3/uL (ref 0.0–0.1)
Basophils Relative: 1 %
Eosinophils Absolute: 0.1 10*3/uL (ref 0.0–0.5)
Eosinophils Relative: 3 %
HCT: 41.6 % (ref 39.0–52.0)
Hemoglobin: 13.8 g/dL (ref 13.0–17.0)
Lymphocytes Relative: 61 %
Lymphs Abs: 2.4 10*3/uL (ref 0.7–4.0)
MCH: 32.5 pg (ref 26.0–34.0)
MCHC: 33.2 g/dL (ref 30.0–36.0)
MCV: 98.1 fL (ref 80.0–100.0)
Monocytes Absolute: 0.3 10*3/uL (ref 0.1–1.0)
Monocytes Relative: 7 %
Neutro Abs: 1.1 10*3/uL — ABNORMAL LOW (ref 1.7–7.7)
Neutrophils Relative %: 28 %
Platelets: 213 10*3/uL (ref 150–400)
RBC: 4.24 MIL/uL (ref 4.22–5.81)
RDW: 13.1 % (ref 11.5–15.5)
WBC: 3.9 10*3/uL — ABNORMAL LOW (ref 4.0–10.5)
nRBC: 0 % (ref 0.0–0.2)

## 2022-07-07 LAB — COMPREHENSIVE METABOLIC PANEL
ALT: 24 U/L (ref 0–44)
AST: 25 U/L (ref 15–41)
Albumin: 4 g/dL (ref 3.5–5.0)
Alkaline Phosphatase: 52 U/L (ref 38–126)
Anion gap: 9 (ref 5–15)
BUN: 12 mg/dL (ref 8–23)
CO2: 28 mmol/L (ref 22–32)
Calcium: 8.8 mg/dL — ABNORMAL LOW (ref 8.9–10.3)
Chloride: 100 mmol/L (ref 98–111)
Creatinine, Ser: 1.06 mg/dL (ref 0.61–1.24)
GFR, Estimated: >60 mL/min (ref >60)
Glucose, Bld: 145 mg/dL — ABNORMAL HIGH (ref 70–99)
Potassium: 4.2 mmol/L (ref 3.5–5.1)
Sodium: 137 mmol/L (ref 135–145)
Total Bilirubin: 0.9 mg/dL (ref 0.3–1.2)
Total Protein: 6.9 g/dL (ref 6.5–8.1)

## 2022-07-07 LAB — LACTATE DEHYDROGENASE: LDH: 108 U/L (ref 98–192)

## 2022-07-07 LAB — MAGNESIUM: Magnesium: 1.7 mg/dL (ref 1.7–2.4)

## 2022-07-07 MED ORDER — ZOLEDRONIC ACID 4 MG/100ML IV SOLN
4.0000 mg | Freq: Once | INTRAVENOUS | Status: DC
  Filled 2022-07-07: qty 100

## 2022-07-07 MED ORDER — SODIUM CHLORIDE 0.9 % IV SOLN: Freq: Once | INTRAVENOUS | Status: DC

## 2022-07-07 MED ORDER — DARATUMUMAB-HYALURONIDASE-FIHJ 1800-30000 MG-UT/15ML ~~LOC~~ SOLN
1800.0000 mg | Freq: Once | SUBCUTANEOUS | Status: AC
  Administered 2022-07-07: 1800 mg via SUBCUTANEOUS
  Filled 2022-07-07: qty 15

## 2022-07-07 NOTE — Patient Instructions (Signed)
[facility]  Discharge Instructions: Thank you for choosing [facility] to provide your oncology and hematology care.  If you have a lab appointment with the Cancer Center - please note that after April 8th, 2024, all labs will be drawn in the cancer center.  You do not have to check in or register with the main entrance as you have in the past but will complete your check-in in the cancer center.  Wear comfortable clothing and clothing appropriate for easy access to any Portacath or PICC line.   We strive to give you quality time with your provider. You may need to reschedule your appointment if you arrive late (15 or more minutes).  Arriving late affects you and other patients whose appointments are after yours.  Also, if you miss three or more appointments without notifying the office, you may be dismissed from the clinic at the provider's discretion.      For prescription refill requests, have your pharmacy contact our office and allow 72 hours for refills to be completed.    Today you received the following chemotherapy and/or immunotherapy agents Daratumumab      To help prevent nausea and vomiting after your treatment, we encourage you to take your nausea medication as directed.  BELOW ARE SYMPTOMS THAT SHOULD BE REPORTED IMMEDIATELY: *FEVER GREATER THAN 100.4 F (38 C) OR HIGHER *CHILLS OR SWEATING *NAUSEA AND VOMITING THAT IS NOT CONTROLLED WITH YOUR NAUSEA MEDICATION *UNUSUAL SHORTNESS OF BREATH *UNUSUAL BRUISING OR BLEEDING *URINARY PROBLEMS (pain or burning when urinating, or frequent urination) *BOWEL PROBLEMS (unusual diarrhea, constipation, pain near the anus) TENDERNESS IN MOUTH AND THROAT WITH OR WITHOUT PRESENCE OF ULCERS (sore throat, sores in mouth, or a toothache) UNUSUAL RASH, SWELLING OR PAIN  UNUSUAL VAGINAL DISCHARGE OR ITCHING   Items with * indicate a potential emergency and should be followed up as soon as possible or go to the  Emergency Department if any problems should occur.  Please show the CHEMOTHERAPY ALERT CARD or IMMUNOTHERAPY ALERT CARD at check-in to the Emergency Department and triage nurse.  Should you have questions after your visit or need to cancel or reschedule your appointment, please contact [facility] [phone]  and follow the prompts.  Office hours are 8:00 a.m. to 4:30 p.m. Monday - Friday. Please note that voicemails left after 4:00 p.m. may not be returned until the following business day.  We are closed weekends and major holidays. You have access to a nurse at all times for urgent questions. Please call the main number to the clinic [phone] and follow the prompts.  For any non-urgent questions, you may also contact your provider using MyChart. We now offer e-Visits for anyone 18 and older to request care online for non-urgent symptoms. For details visit [email or web address].   Also download the MyChart app! Go to the app store, search "MyChart", open the app, select [facility], and log in with your MyChart username and password.   

## 2022-07-07 NOTE — Progress Notes (Signed)
Patient presents today for Zometa and Daratumumab per providers order.  Vital signs and labs within parameters for treatment.  Patient has no new complaints at this time.  Labs faxed to clinic and reviewed by charge nurse.  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   Daratumumab administration without incident; injection site WNL; see MAR for injection details.  Patient tolerated procedure well and without incident.  No questions or complaints noted at this time. Discharge from clinic ambulatory in stable condition.  Alert and oriented X 3.  Follow up with [facility] as scheduled.

## 2022-07-07 NOTE — Progress Notes (Signed)
ANC is 1.1 today per [employer]. Awaiting final results via fax.

## 2022-07-08 LAB — KAPPA/LAMBDA LIGHT CHAINS
Kappa free light chain: 17 mg/L (ref 3.3–19.4)
Kappa, lambda light chain ratio: 1.81 — ABNORMAL HIGH (ref 0.26–1.65)
Lambda free light chains: 9.4 mg/L (ref 5.7–26.3)

## 2022-07-10 LAB — PROTEIN ELECTROPHORESIS, SERUM
A/G Ratio: 1.5 (ref 0.7–1.7)
Albumin ELP: 3.7 g/dL (ref 2.9–4.4)
Alpha-1-Globulin: 0.2 g/dL (ref 0.0–0.4)
Alpha-2-Globulin: 0.6 g/dL (ref 0.4–1.0)
Beta Globulin: 0.9 g/dL (ref 0.7–1.3)
Gamma Globulin: 0.7 g/dL (ref 0.4–1.8)
Globulin, Total: 2.5 g/dL (ref 2.2–3.9)
Total Protein ELP: 6.2 g/dL (ref 6.0–8.5)

## 2022-07-17 ENCOUNTER — Other Ambulatory Visit: Payer: Self-pay | Admitting: Hematology

## 2022-07-20 ENCOUNTER — Encounter (HOSPITAL_COMMUNITY): Payer: Self-pay | Admitting: Hematology

## 2022-07-20 ENCOUNTER — Other Ambulatory Visit: Payer: Self-pay

## 2022-07-20 ENCOUNTER — Encounter: Payer: Self-pay | Admitting: Hematology

## 2022-07-20 MED ORDER — LENALIDOMIDE 10 MG PO CAPS: ORAL_CAPSULE | ORAL | 0 refills | Status: DC

## 2022-07-20 NOTE — Telephone Encounter (Signed)
Chart reviewed. Revlimid refilled per last office note with Dr. Katragadda.  

## 2022-07-29 ENCOUNTER — Ambulatory Visit: Payer: Medicare Other | Attending: Student | Admitting: Student

## 2022-07-29 ENCOUNTER — Encounter: Payer: Self-pay | Admitting: Student

## 2022-07-29 VITALS — BP 110/64 | HR 74 | Ht 69.0 in | Wt 234.0 lb

## 2022-07-29 DIAGNOSIS — C9000 Multiple myeloma not having achieved remission: Secondary | ICD-10-CM | POA: Diagnosis not present

## 2022-07-29 DIAGNOSIS — E785 Hyperlipidemia, unspecified: Secondary | ICD-10-CM | POA: Diagnosis not present

## 2022-07-29 DIAGNOSIS — I2510 Atherosclerotic heart disease of native coronary artery without angina pectoris: Secondary | ICD-10-CM | POA: Diagnosis present

## 2022-07-29 IMAGING — CT NM PET IMAGE RESTAGE (PS) WHOLE BODY
7 series · 25 of 25 positions shown · non-contrast
Comparison: PET-CT July 04, 2020

CLINICAL DATA: Subsequent treatment strategy for multiple myeloma.

EXAM:
NUCLEAR MEDICINE PET WHOLE BODY
TECHNIQUE: 11.32 mCi F-18 FDG was injected intravenously. Full-ring PET imaging
was performed from the head to foot after the radiotracer. CT data
was obtained and used for attenuation correction and anatomic
localization.
Fasting blood glucose: 91 mg/dl

[Series 3: ctac · axial · 3.0mm · 0.98mm/px · z∈[-333,+1524]mm · 4 of 620 slices shown]
[im 1/620  full-range]
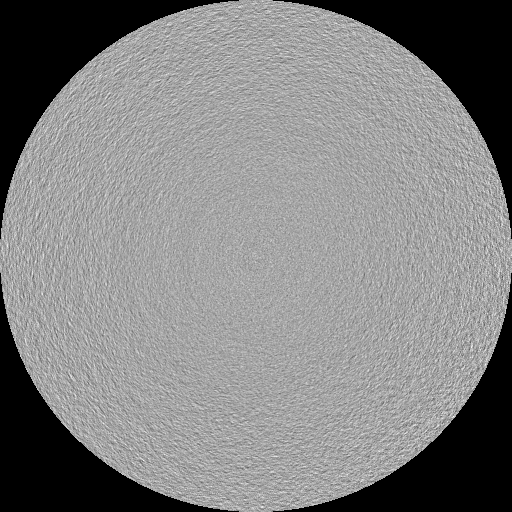
[im 207/620  soft-tissue]
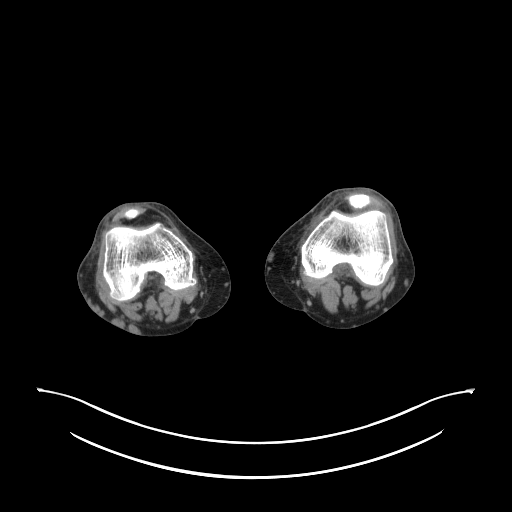
[im 413/620  soft-tissue]
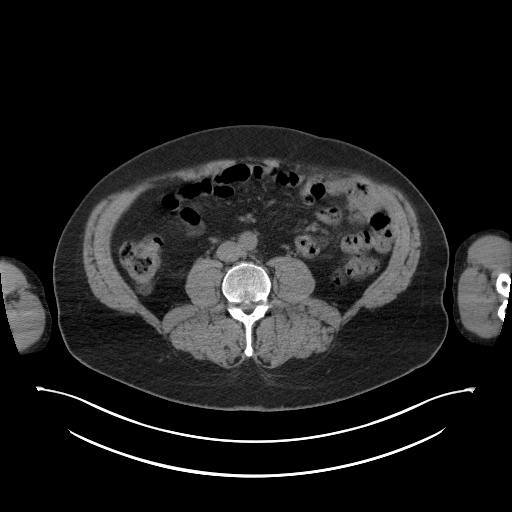
[im 620/620  full-range]
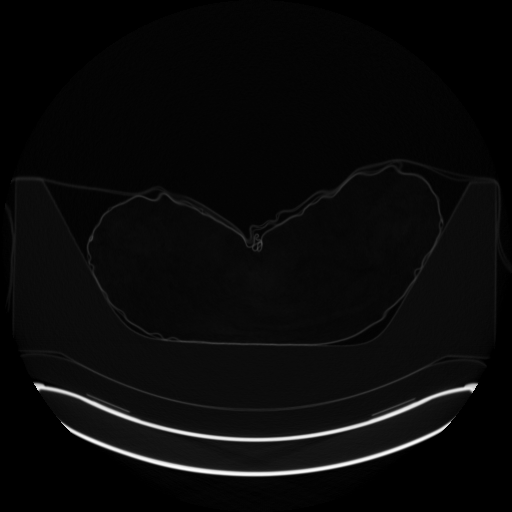

[Series 4: pet ac · axial · 3.0mm · 4.11mm/px · z∈[-333,+1524]mm · 5 of 620 slices shown]
[im 1/620]
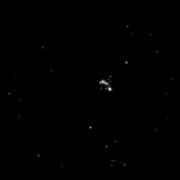
[im 155/620]
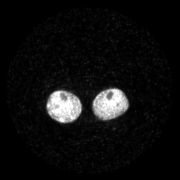
[im 310/620]
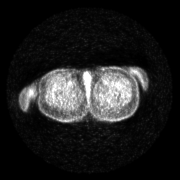
[im 465/620]
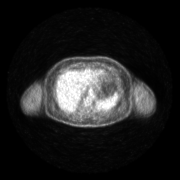
[im 620/620]
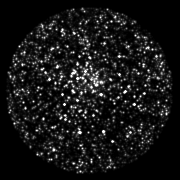

[Series 5: pet nac · axial · 3.0mm · 4.11mm/px · z∈[-333,+1524]mm · 5 of 620 slices shown]
[im 1/620]
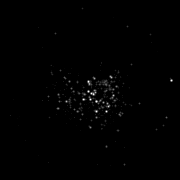
[im 155/620]
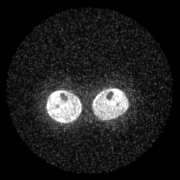
[im 310/620]
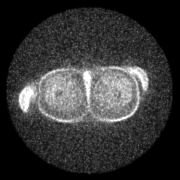
[im 465/620]
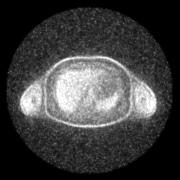
[im 620/620]
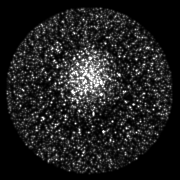

[Series 9: ct lung · axial · 3.0mm · 0.98mm/px · 1 of 83 slices shown]
[im 1/83  lung]
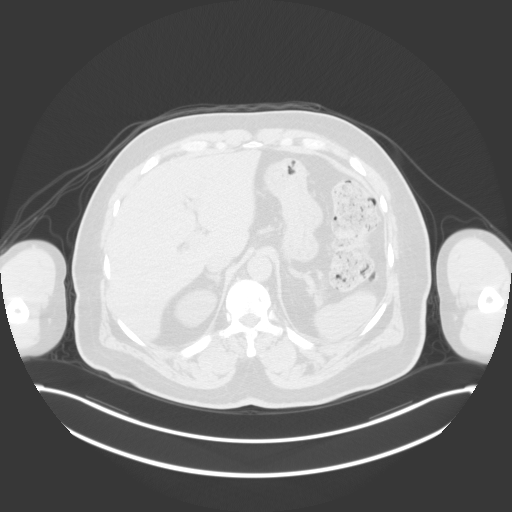

[Series 606: fused tra · 8 of 917 slices shown]
[im 1/917]
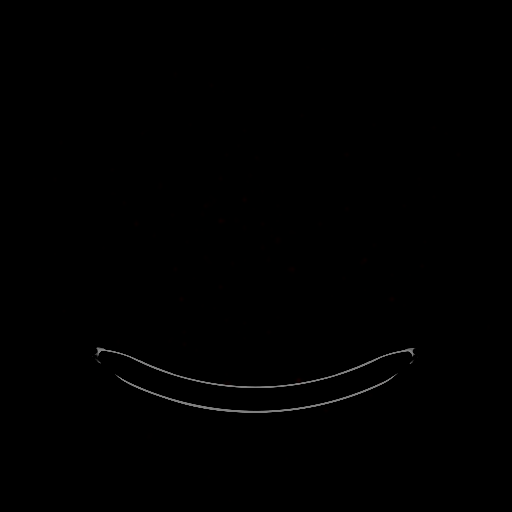
[im 131/917]
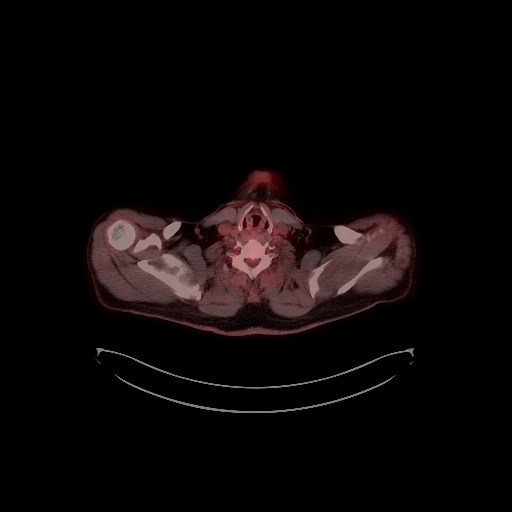
[im 262/917]
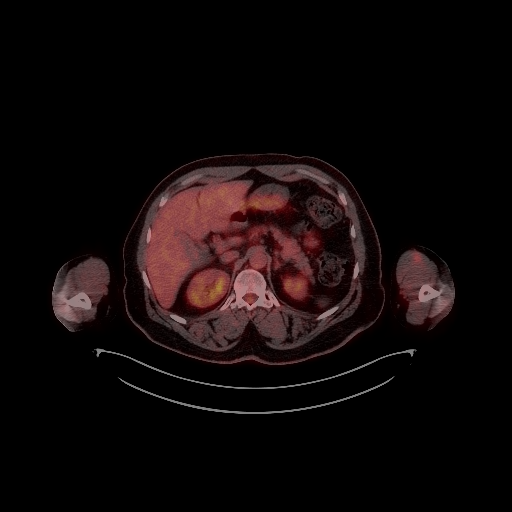
[im 393/917]
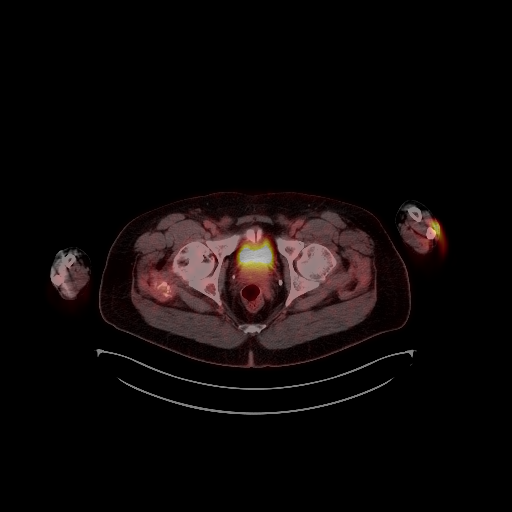
[im 524/917]
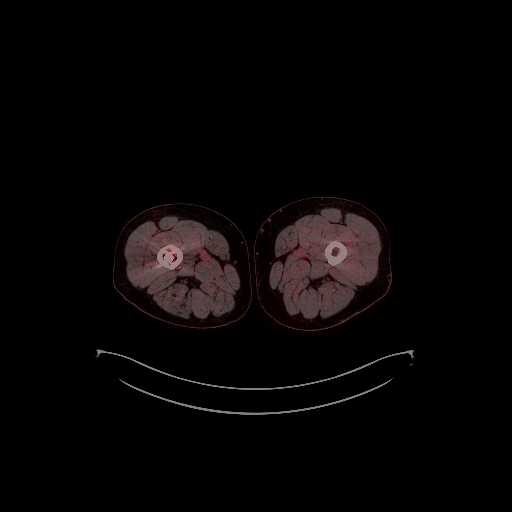
[im 655/917]
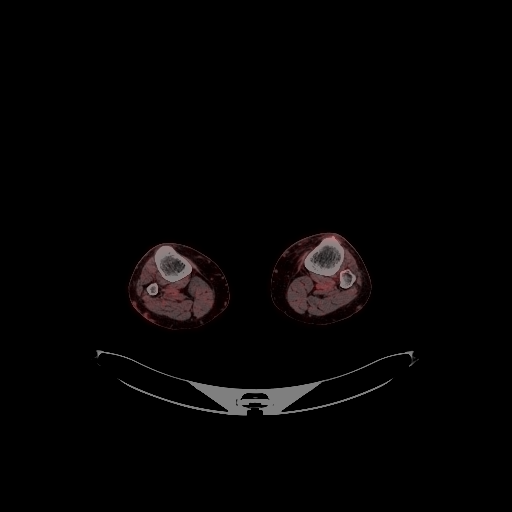
[im 786/917]
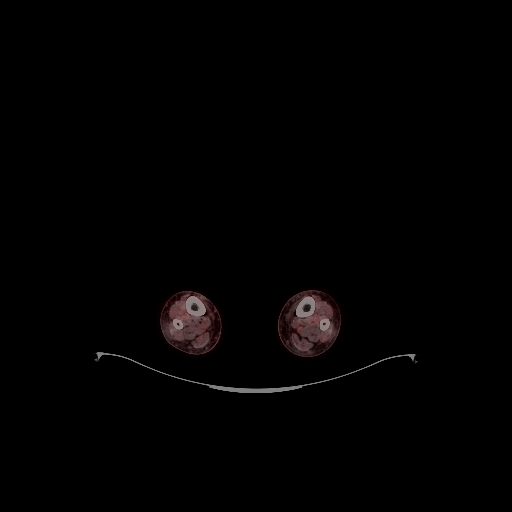
[im 917/917]
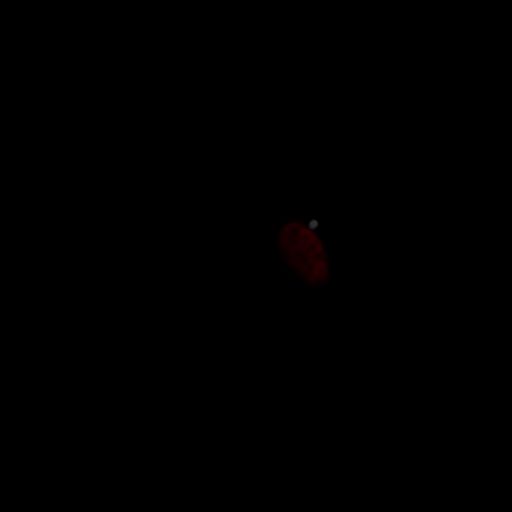

[Series 608: fused cor · 1 of 111 slices shown]
[im 1/111]
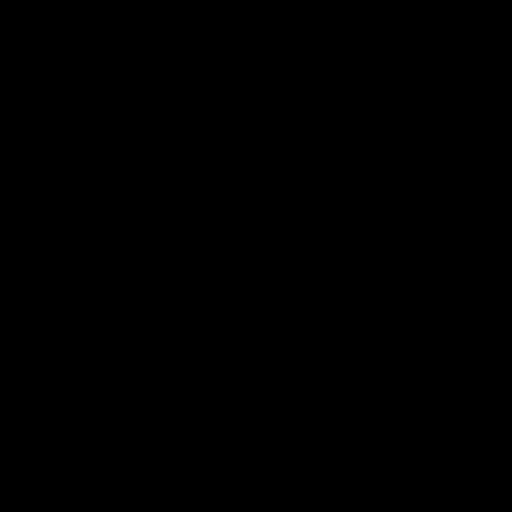

[Series 609: mip cine · coronal · 3.84mm/px · 1 of 48 slices shown]
[im 1/48]
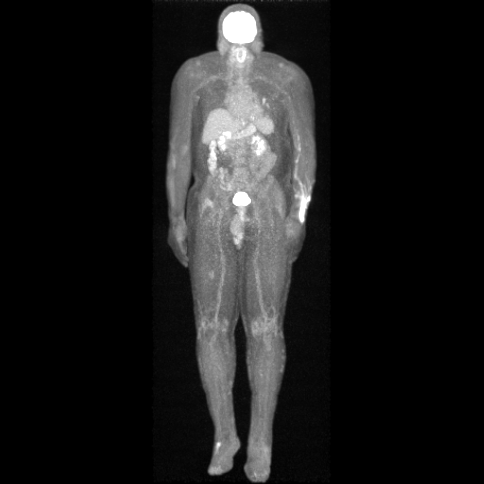

[25 of 25 positions shown; findings below may reference images not displayed]

FINDINGS: Mediastinal blood pool activity: SUV max

HEAD/NECK: Hypermetabolic hyperplasia of the tonsils slightly
asymmetric to the left with a max SUV of 5.9.

No hypermetabolic cervical lymph nodes.

Incidental CT findings: none

CHEST: No hypermetabolic thoracic lymph nodes.

Mildly metabolic pulmonary nodules in the dependent left lower lobe
are new from prior measure up to 11 mm on image 45/9 with a max SUV
of 3.7.

Incidental CT findings: Similar prominent vascular anatomy in the
posterior left lower lobe possibly reflecting in AVM or pulmonary
artery aneurysm.

ABDOMEN/PELVIS: No abnormal hypermetabolic activity within the
liver, pancreas, adrenal glands, or spleen. No hypermetabolic lymph
nodes in the abdomen or pelvis.

Incidental CT findings: Left-sided colonic diverticulosis without
findings of acute diverticulitis. Moderate stool burden. Mild
symmetric wall thickening of an incompletely distended urinary
bladder. Mild enlargement of prostate gland.

SKELETON: Significant interval decrease in abnormal FDG avidity
within the multiple hypermetabolic skeletal lesions seen on prior
examination. No new sites of abnormal osseous hypermetabolic
activity. For reference:

-mildly metabolic focus in the right clavicular head and right
humeral head no longer demonstrate abnormal FDG avidity both of
which previously demonstrated a max SUV of

-lytic soft tissue lesion in the right L5 vertebral body measures
3.7 x 2.8 cm with a max SUV of 2.9 previously measuring 3.4 x 3.4 cm
with a max SUV of 6.6.

-soft tissue lesion in the posterior right acetabulum measures
cm on image 266/3 with a max SUV of 2.5 previously measuring 2.2 cm
with a max SUV of 9.

Incidental CT findings: none

EXTREMITIES: No abnormal hypermetabolic activity in the lower
extremities.

Incidental CT findings: Interval placement of intramedullary rod and
screw fixation hardware in the right femur.
IMPRESSION: 1. Significant interval decrease FDG avidity in the now minimally
metabolic multifocal osseous lesions, some of which no longer
demonstrate abnormal FDG avidity. No new hypermetabolic osseous
lesions identified.
2. New mildly metabolic left lower lobe pulmonary nodules measuring
up to 11 mm are nonspecific possibly reflecting infectious or
inflammatory etiology or disease involvement. Consider attention on
dedicated follow-up short-term interval follow-up chest CT.
3. Hypermetabolic hyperplasia of the tonsils slightly asymmetric to
the left consider correlation with direct visualization.

## 2022-07-29 MED ORDER — NITROGLYCERIN 0.4 MG SL SUBL: 0.4000 mg | SUBLINGUAL_TABLET | SUBLINGUAL | 3 refills | Status: AC | PRN

## 2022-07-29 NOTE — Patient Instructions (Signed)
Medication Instructions:   Continue current medication regimen.   *If you need a refill on your cardiac medications before your next appointment, please call your pharmacy*   Follow-Up: At [facility], you and your health needs are our priority.  As part of our continuing mission to provide you with exceptional heart care, we have created designated Provider Care Teams.  These Care Teams include your primary Cardiologist (physician) and Advanced Practice Providers (APPs -  Physician Assistants and Nurse Practitioners) who all work together to provide you with the care you need, when you need it.  We recommend signing up for the patient portal called "MyChart".  Sign up information is provided on this After Visit Summary.  MyChart is used to connect with patients for Virtual Visits (Telemedicine).  Patients are able to view lab/test results, encounter notes, upcoming appointments, etc.  Non-urgent messages can be sent to your provider as well.   To learn more about what you can do with MyChart, go to [email or web address].    Your next appointment:   1 year(s)  Provider:   You may see Dina Rich, MD or one of the following Advanced Practice Providers on your designated Care Team:   Pablo, PA-C  Jacolyn Reedy, [place]

## 2022-07-29 NOTE — Progress Notes (Signed)
Cardiology Office Note    Date:  07/29/2022  ID:  Eugene [name], DOB January 17, 1955, MRN [id number] Cardiologist: Dina Rich, MD    History of Present Illness:    Eugene [name] is a 67 y.o. male with past medical history of CAD (s/p NSTEMI in 01/2017 with cath showing 25% prox-LAD stenosis and no obstructive disease), HTN, HLD and multiple myeloma who presents to the office today for annual follow-up.   He was last examined by Dr. Wyline Mood in 07/2021 and denied any recent chest pain or dyspnea on exertion. He was continued on his current medications at that time.  In talking with the patient today, he reports overall doing well from a cardiac perspective since his last visit.  Reports his main limiting factor at this time is left knee pain and he was recently evaluated by orthopedics and it was not felt that his symptoms were secondary to arthritis at that time. He denies any exertional chest pain but does report an episode of chest pain at night a few months ago which resolved with nitroglycerin.  He had not consumed spicy foods but is unsure if he consumed tomato-based products at that time. Denies any recurrent chest pain at rest or with activity since. Was previously moving furniture a few weeks ago without symptoms. Denies any recent dyspnea on exertion, orthopnea, PND or pitting edema.   Studies Reviewed:   EKG: EKG is ordered today and demonstrates:   EKG Interpretation Date/Time:  Wednesday July 29 2022 13:33:46 EDT Ventricular Rate:  71 PR Interval:  152 QRS Duration:  84 QT Interval:  364 QTC Calculation: 395 R Axis:   -14  Text Interpretation: Normal sinus rhythm T wave inversion along Leads III and AVF Confirmed by Randall An ([id number]) on 07/29/2022 1:39:08 PM       LHC: 01/2017 Prox LAD lesion is 25% stenosed. The left ventricular systolic function is normal. LV end diastolic pressure is normal. The left ventricular ejection fraction is 55-65% by visual  estimate.   1. No significant obstructive CAD. Initial views were concerning for hypodensity in the proximal LAD but there was poor filling due to poor catheter engagement. When a guide catheter was used with better filling this was less with no filling defect noted. 2. Normal LV function 3. Normal LVEDP   Plan: medical therapy. Cycle cardiac enzymes and Ecg. Check Echo.    Echocardiogram: 01/2017 Study Conclusions   - Left ventricle: The cavity size was normal. Wall thickness was    normal. Systolic function was normal. The estimated ejection    fraction was in the range of 55% to 60%. Wall motion was normal;    there were no regional wall motion abnormalities. Left    ventricular diastolic function parameters were normal.  - Left atrium: The atrium was mildly dilated.  - Pulmonary arteries: PA peak pressure: 33 mm Hg (S).    Physical Exam:   VS:  BP 110/64   Pulse 74   Ht 5\' 9"  (1.753 m)   Wt 234 lb (106.1 kg)   SpO2 97%   BMI 34.56 kg/m    Wt Readings from Last 3 Encounters:  07/29/22 234 lb (106.1 kg)  07/07/22 236 lb (107 kg)  06/18/22 229 lb 9.6 oz (104.1 kg)     GEN: Well nourished, well developed male appearing in no acute distress NECK: No JVD; No carotid bruits CARDIAC: RRR, no murmurs, rubs, gallops RESPIRATORY:  Clear to auscultation without rales, wheezing or rhonchi  ABDOMEN:  Appears non-distended. No obvious abdominal masses. EXTREMITIES: No clubbing or cyanosis. No pitting edema.  Distal pedal pulses are 2+ bilaterally.   Assessment and Plan:   1. CAD - Prior catheterization in 01/2017 showed only 25% prox-LAD stenosis and no obstructive disease. He reports one episode of pain at rest which overall seems atypical for angina as he has been active at baseline since and denies any recurrent symptoms. EKG today shows TWI along the inferior leads which is similar to prior tracings with no acute changes. I encouraged him to make [place] aware if he has more  frequent episodes of pain as we could obtain a Coronary CTA for further evaluation. - Continue current medical therapy with ASA 81 mg daily, Toprol-XL 25 mg daily and Crestor 40 mg every other day. Updated Rx for SL NTG provided.   2. HLD - FLP in 06/2022 showed total cholesterol 150, triglycerides 62, HDL 51 and LDL 87. He reports he has not been taking Crestor regularly due to pill fatigue. I encouraged him to at least try to take this every other day.  3. Multiple Myeloma - Being followed by Oncology and remains on monthly Daratumumab and Revlimid.    Signed, Ellsworth Lennox, PA-C

## 2022-08-03 ENCOUNTER — Other Ambulatory Visit: Payer: Self-pay

## 2022-08-03 DIAGNOSIS — C9000 Multiple myeloma not having achieved remission: Secondary | ICD-10-CM

## 2022-08-04 ENCOUNTER — Ambulatory Visit: Payer: BLUE CROSS/BLUE SHIELD | Admitting: Hematology

## 2022-08-04 ENCOUNTER — Inpatient Hospital Stay: Payer: Medicare Other

## 2022-08-04 ENCOUNTER — Inpatient Hospital Stay: Payer: Medicare Other | Attending: Hematology

## 2022-08-04 VITALS — BP 140/80 | HR 62 | Temp 96.1°F | Resp 20 | Wt 237.0 lb

## 2022-08-04 DIAGNOSIS — C9000 Multiple myeloma not having achieved remission: Secondary | ICD-10-CM | POA: Diagnosis present

## 2022-08-04 DIAGNOSIS — Z5112 Encounter for antineoplastic immunotherapy: Secondary | ICD-10-CM | POA: Insufficient documentation

## 2022-08-04 LAB — CBC WITH DIFFERENTIAL/PLATELET
Abs Immature Granulocytes: 0.01 10*3/uL (ref 0.00–0.07)
Basophils Absolute: 0 10*3/uL (ref 0.0–0.1)
Basophils Relative: 1 %
Eosinophils Absolute: 0.2 10*3/uL (ref 0.0–0.5)
Eosinophils Relative: 5 %
HCT: 41.4 % (ref 39.0–52.0)
Hemoglobin: 13.9 g/dL (ref 13.0–17.0)
Immature Granulocytes: 0 %
Lymphocytes Relative: 61 %
Lymphs Abs: 2.3 10*3/uL (ref 0.7–4.0)
MCH: 32.6 pg (ref 26.0–34.0)
MCHC: 33.6 g/dL (ref 30.0–36.0)
MCV: 97.2 fL (ref 80.0–100.0)
Monocytes Absolute: 0.3 10*3/uL (ref 0.1–1.0)
Monocytes Relative: 8 %
Neutro Abs: 1 10*3/uL — ABNORMAL LOW (ref 1.7–7.7)
Neutrophils Relative %: 25 %
Platelets: 234 10*3/uL (ref 150–400)
RBC: 4.26 MIL/uL (ref 4.22–5.81)
RDW: 13.2 % (ref 11.5–15.5)
WBC: 3.8 10*3/uL — ABNORMAL LOW (ref 4.0–10.5)
nRBC: 0 % (ref 0.0–0.2)

## 2022-08-04 LAB — COMPREHENSIVE METABOLIC PANEL
ALT: 29 U/L (ref 0–44)
AST: 38 U/L (ref 15–41)
Albumin: 4 g/dL (ref 3.5–5.0)
Alkaline Phosphatase: 58 U/L (ref 38–126)
Anion gap: 10 (ref 5–15)
BUN: 11 mg/dL (ref 8–23)
CO2: 25 mmol/L (ref 22–32)
Calcium: 8.9 mg/dL (ref 8.9–10.3)
Chloride: 101 mmol/L (ref 98–111)
Creatinine, Ser: 1.03 mg/dL (ref 0.61–1.24)
GFR, Estimated: >60 mL/min (ref >60)
Glucose, Bld: 147 mg/dL — ABNORMAL HIGH (ref 70–99)
Potassium: 3.7 mmol/L (ref 3.5–5.1)
Sodium: 136 mmol/L (ref 135–145)
Total Bilirubin: 1.1 mg/dL (ref 0.3–1.2)
Total Protein: 7 g/dL (ref 6.5–8.1)

## 2022-08-04 LAB — MAGNESIUM: Magnesium: 1.8 mg/dL (ref 1.7–2.4)

## 2022-08-04 MED ORDER — CETIRIZINE HCL 10 MG PO TABS
10.0000 mg | ORAL_TABLET | Freq: Once | ORAL | Status: AC
  Administered 2022-08-04: 10 mg via ORAL
  Filled 2022-08-04: qty 1

## 2022-08-04 MED ORDER — ZOLEDRONIC ACID 4 MG/100ML IV SOLN: 4.0000 mg | Freq: Once | INTRAVENOUS | Status: DC

## 2022-08-04 MED ORDER — DARATUMUMAB-HYALURONIDASE-FIHJ 1800-30000 MG-UT/15ML ~~LOC~~ SOLN
1800.0000 mg | Freq: Once | SUBCUTANEOUS | Status: AC
  Administered 2022-08-04: 1800 mg via SUBCUTANEOUS
  Filled 2022-08-04: qty 15

## 2022-08-04 MED ORDER — SODIUM CHLORIDE 0.9 % IV SOLN: Freq: Once | INTRAVENOUS | Status: AC

## 2022-08-04 NOTE — Progress Notes (Unsigned)
Hold zometa today due to dental notes:  Periodontal Diagnosis: Severe Periodontitis (greater than 50% bone loss)   V.O. Dr Carilyn Goodpasture, PharmD

## 2022-08-04 NOTE — Progress Notes (Unsigned)
Patient presents today for chemotherapy infusion.  Patient is in satisfactory condition with no new complaints voiced.  Vital signs are stable.  Labs reviewed and all labs are within treatment parameters.  We will proceed with treatment per MD orders.

## 2022-08-05 ENCOUNTER — Encounter: Payer: Self-pay | Admitting: Hematology

## 2022-08-05 ENCOUNTER — Encounter (HOSPITAL_COMMUNITY): Payer: Self-pay | Admitting: Hematology

## 2022-08-05 LAB — KAPPA/LAMBDA LIGHT CHAINS
Kappa free light chain: 19.1 mg/L (ref 3.3–19.4)
Kappa, lambda light chain ratio: 1.62 (ref 0.26–1.65)
Lambda free light chains: 11.8 mg/L (ref 5.7–26.3)

## 2022-08-05 NOTE — Patient Instructions (Signed)
[facility]  Discharge Instructions: Thank you for choosing [facility] to provide your oncology and hematology care.  If you have a lab appointment with the Cancer Center - please note that after April 8th, 2024, all labs will be drawn in the cancer center.  You do not have to check in or register with the main entrance as you have in the past but will complete your check-in in the cancer center.  Wear comfortable clothing and clothing appropriate for easy access to any Portacath or PICC line.   We strive to give you quality time with your provider. You may need to reschedule your appointment if you arrive late (15 or more minutes).  Arriving late affects you and other patients whose appointments are after yours.  Also, if you miss three or more appointments without notifying the office, you may be dismissed from the clinic at the provider's discretion.      For prescription refill requests, have your pharmacy contact our office and allow 72 hours for refills to be completed.    Today you received the following chemotherapy and/or immunotherapy agents [facility] [place]   To help prevent nausea and vomiting after your treatment, we encourage you to take your nausea medication as directed.  Daratumumab; Hyaluronidase Injection What is this medication? DARATUMUMAB; HYALURONIDASE (dar a toom ue mab; hye al ur ON i dase) treats multiple myeloma, a type of bone marrow cancer. Daratumumab works by blocking a protein that causes cancer cells to grow and multiply. This helps to slow or stop the spread of cancer cells. Hyaluronidase works by increasing the absorption of other medications in the body to help them work better. This medication may also be used treat amyloidosis, a condition that causes the buildup of a protein (amyloid) in your body. It works by reducing the buildup of this protein, which decreases symptoms. It is a combination medication that contains a monoclonal  antibody. This medicine may be used for other purposes; ask your health care provider or pharmacist if you have questions. COMMON BRAND NAME(S): DARZALEX FASPRO What should I tell my care team before I take this medication? They need to know if you have any of these conditions: Heart disease Infection, such as chickenpox, cold sores, herpes, hepatitis B Lung or breathing disease An unusual or allergic reaction to daratumumab, hyaluronidase, other medications, foods, dyes, or preservatives Pregnant or trying to get pregnant Breast-feeding How should I use this medication? This medication is injected under the skin. It is given by your care team in a hospital or clinic setting. Talk to your care team about the use of this medication in children. Special care may be needed. Overdosage: If you think you have taken too much of this medicine contact a poison control center or emergency room at once. NOTE: This medicine is only for you. Do not share this medicine with others. What if I miss a dose? Keep appointments for follow-up doses. It is important not to miss your dose. Call your care team if you are unable to keep an appointment. What may interact with this medication? Interactions have not been studied. This list may not describe all possible interactions. Give your health care provider a list of all the medicines, herbs, non-prescription drugs, or dietary supplements you use. Also tell them if you smoke, drink alcohol, or use illegal drugs. Some items may interact with your medicine. What should I watch for while using this medication? Your condition will be monitored   carefully while you are receiving this medication. This medication can cause serious allergic reactions. To reduce your risk, your care team may give you other medication to take before receiving this one. Be sure to follow the directions from your care team. This medication can affect the results of blood tests to match your  blood type. These changes can last for up to 6 months after the final dose. Your care team will do blood tests to match your blood type before you start treatment. Tell all of your care team that you are being treated with this medication before receiving a blood transfusion. This medication can affect the results of some tests used to determine treatment response; extra tests may be needed to evaluate response. Talk to your care team if you wish to become pregnant or think you are pregnant. This medication can cause serious birth defects if taken during pregnancy and for 3 months after the last dose. A reliable form of contraception is recommended while taking this medication and for 3 months after the last dose. Talk to your care team about effective forms of contraception. Do not breast-feed while taking this medication. What side effects may I notice from receiving this medication? Side effects that you should report to your care team as soon as possible: Allergic reactions--skin rash, itching, hives, swelling of the face, lips, tongue, or throat Heart rhythm changes--fast or irregular heartbeat, dizziness, feeling faint or lightheaded, chest pain, trouble breathing Infection--fever, chills, cough, sore throat, wounds that don't heal, pain or trouble when passing urine, general feeling of discomfort or being unwell Infusion reactions--chest pain, shortness of breath or trouble breathing, feeling faint or lightheaded Sudden eye pain or change in vision such as blurry vision, seeing halos around lights, vision loss Unusual bruising or bleeding Side effects that usually do not require medical attention (report to your care team if they continue or are bothersome): Constipation Diarrhea Fatigue Nausea Pain, tingling, or numbness in the hands or feet Swelling of the ankles, hands, or feet This list may not describe all possible side effects. Call your doctor for medical advice about side effects.  You may report side effects to FDA at 1-800-FDA-1088. Where should I keep my medication? This medication is given in a hospital or clinic. It will not be stored at home. NOTE: This sheet is a summary. It may not cover all possible information. If you have questions about this medicine, talk to your doctor, pharmacist, or health care provider.  2024 Elsevier/Gold Standard (2021-05-06 00:00:00)   BELOW ARE SYMPTOMS THAT SHOULD BE REPORTED IMMEDIATELY: *FEVER GREATER THAN 100.4 F (38 C) OR HIGHER *CHILLS OR SWEATING *NAUSEA AND VOMITING THAT IS NOT CONTROLLED WITH YOUR NAUSEA MEDICATION *UNUSUAL SHORTNESS OF BREATH *UNUSUAL BRUISING OR BLEEDING *URINARY PROBLEMS (pain or burning when urinating, or frequent urination) *BOWEL PROBLEMS (unusual diarrhea, constipation, pain near the anus) TENDERNESS IN MOUTH AND THROAT WITH OR WITHOUT PRESENCE OF ULCERS (sore throat, sores in mouth, or a toothache) UNUSUAL RASH, SWELLING OR PAIN  UNUSUAL VAGINAL DISCHARGE OR ITCHING   Items with * indicate a potential emergency and should be followed up as soon as possible or go to the Emergency Department if any problems should occur.  Please show the CHEMOTHERAPY ALERT CARD or IMMUNOTHERAPY ALERT CARD at check-in to the Emergency Department and triage nurse.  Should you have questions after your visit or need to cancel or reschedule your appointment, please contact [facility] [phone]  and follow the prompts.    Office hours are 8:00 a.m. to 4:30 p.m. Monday - Friday. Please note that voicemails left after 4:00 p.m. may not be returned until the following business day.  We are closed weekends and major holidays. You have access to a nurse at all times for urgent questions. Please call the main number to the clinic [phone] and follow the prompts.  For any non-urgent questions, you may also contact your provider using MyChart. We now offer e-Visits for anyone 18 and older to request  care online for non-urgent symptoms. For details visit [email or web address].   Also download the MyChart app! Go to the app store, search "MyChart", open the app, select [facility], and log in with your MyChart username and password.   

## 2022-08-13 ENCOUNTER — Other Ambulatory Visit: Payer: Self-pay | Admitting: Hematology

## 2022-08-14 ENCOUNTER — Other Ambulatory Visit: Payer: Self-pay

## 2022-08-14 MED ORDER — LENALIDOMIDE 10 MG PO CAPS: ORAL_CAPSULE | ORAL | 0 refills | Status: DC

## 2022-08-14 NOTE — Telephone Encounter (Signed)
Chart reviewed. Revlimid refilled per last office note with Dr. Katragadda.  

## 2022-08-31 ENCOUNTER — Other Ambulatory Visit: Payer: Self-pay

## 2022-08-31 DIAGNOSIS — C9000 Multiple myeloma not having achieved remission: Secondary | ICD-10-CM

## 2022-09-01 ENCOUNTER — Inpatient Hospital Stay (HOSPITAL_BASED_OUTPATIENT_CLINIC_OR_DEPARTMENT_OTHER): Payer: Medicare Other | Admitting: Hematology

## 2022-09-01 ENCOUNTER — Inpatient Hospital Stay: Payer: Medicare Other | Attending: Hematology

## 2022-09-01 ENCOUNTER — Inpatient Hospital Stay: Payer: Medicare Other

## 2022-09-01 VITALS — BP 128/75 | HR 60 | Temp 97.9°F | Resp 16 | Wt 239.2 lb

## 2022-09-01 VITALS — BP 131/73 | HR 55 | Temp 96.7°F | Resp 18

## 2022-09-01 DIAGNOSIS — C9000 Multiple myeloma not having achieved remission: Secondary | ICD-10-CM | POA: Insufficient documentation

## 2022-09-01 DIAGNOSIS — Z5112 Encounter for antineoplastic immunotherapy: Secondary | ICD-10-CM | POA: Diagnosis present

## 2022-09-01 DIAGNOSIS — M25562 Pain in left knee: Secondary | ICD-10-CM | POA: Insufficient documentation

## 2022-09-01 DIAGNOSIS — R918 Other nonspecific abnormal finding of lung field: Secondary | ICD-10-CM | POA: Insufficient documentation

## 2022-09-01 LAB — COMPREHENSIVE METABOLIC PANEL
ALT: 26 U/L (ref 0–44)
AST: 26 U/L (ref 15–41)
Albumin: 3.8 g/dL (ref 3.5–5.0)
Alkaline Phosphatase: 59 U/L (ref 38–126)
Anion gap: 11 (ref 5–15)
BUN: 11 mg/dL (ref 8–23)
CO2: 24 mmol/L (ref 22–32)
Calcium: 8.7 mg/dL — ABNORMAL LOW (ref 8.9–10.3)
Chloride: 101 mmol/L (ref 98–111)
Creatinine, Ser: 1 mg/dL (ref 0.61–1.24)
GFR, Estimated: >60 mL/min (ref >60)
Glucose, Bld: 119 mg/dL — ABNORMAL HIGH (ref 70–99)
Potassium: 3.6 mmol/L (ref 3.5–5.1)
Sodium: 136 mmol/L (ref 135–145)
Total Bilirubin: 0.6 mg/dL (ref 0.3–1.2)
Total Protein: 6.3 g/dL — ABNORMAL LOW (ref 6.5–8.1)

## 2022-09-01 LAB — CBC WITH DIFFERENTIAL/PLATELET
Abs Immature Granulocytes: 0.02 10*3/uL (ref 0.00–0.07)
Basophils Absolute: 0 10*3/uL (ref 0.0–0.1)
Basophils Relative: 1 %
Eosinophils Absolute: 0.2 10*3/uL (ref 0.0–0.5)
Eosinophils Relative: 4 %
HCT: 41.1 % (ref 39.0–52.0)
Hemoglobin: 13.6 g/dL (ref 13.0–17.0)
Immature Granulocytes: 1 %
Lymphocytes Relative: 57 %
Lymphs Abs: 2.4 10*3/uL (ref 0.7–4.0)
MCH: 32.4 pg (ref 26.0–34.0)
MCHC: 33.1 g/dL (ref 30.0–36.0)
MCV: 97.9 fL (ref 80.0–100.0)
Monocytes Absolute: 0.4 10*3/uL (ref 0.1–1.0)
Monocytes Relative: 9 %
Neutro Abs: 1.2 10*3/uL — ABNORMAL LOW (ref 1.7–7.7)
Neutrophils Relative %: 28 %
Platelets: 216 10*3/uL (ref 150–400)
RBC: 4.2 MIL/uL — ABNORMAL LOW (ref 4.22–5.81)
RDW: 13.2 % (ref 11.5–15.5)
WBC: 4.2 10*3/uL (ref 4.0–10.5)
nRBC: 0 % (ref 0.0–0.2)

## 2022-09-01 LAB — MAGNESIUM: Magnesium: 1.8 mg/dL (ref 1.7–2.4)

## 2022-09-01 MED ORDER — DARATUMUMAB-HYALURONIDASE-FIHJ 1800-30000 MG-UT/15ML ~~LOC~~ SOLN
1800.0000 mg | Freq: Once | SUBCUTANEOUS | Status: AC
  Administered 2022-09-01: 1800 mg via SUBCUTANEOUS
  Filled 2022-09-01: qty 15

## 2022-09-01 MED ORDER — SODIUM CHLORIDE 0.9 % IV SOLN: Freq: Once | INTRAVENOUS | Status: AC

## 2022-09-01 MED ORDER — CETIRIZINE HCL 10 MG PO TABS
10.0000 mg | ORAL_TABLET | Freq: Once | ORAL | Status: AC
  Administered 2022-09-01: 10 mg via ORAL
  Filled 2022-09-01: qty 1

## 2022-09-01 MED ORDER — ZOLEDRONIC ACID 4 MG/100ML IV SOLN
4.0000 mg | Freq: Once | INTRAVENOUS | Status: AC
  Administered 2022-09-01: 4 mg via INTRAVENOUS
  Filled 2022-09-01: qty 100

## 2022-09-01 NOTE — Patient Instructions (Signed)
[facility]  Discharge Instructions: Thank you for choosing [facility] to provide your oncology and hematology care.  If you have a lab appointment with the Cancer Center - please note that after April 8th, 2024, all labs will be drawn in the cancer center.  You do not have to check in or register with the main entrance as you have in the past but will complete your check-in in the cancer center.  Wear comfortable clothing and clothing appropriate for easy access to any Portacath or PICC line.   We strive to give you quality time with your provider. You may need to reschedule your appointment if you arrive late (15 or more minutes).  Arriving late affects you and other patients whose appointments are after yours.  Also, if you miss three or more appointments without notifying the office, you may be dismissed from the clinic at the provider's discretion.      For prescription refill requests, have your pharmacy contact our office and allow 72 hours for refills to be completed.    Today you received the following chemotherapy and/or immunotherapy agents Daratumumab/Zometa.   Daratumumab; Hyaluronidase Injection What is this medication? DARATUMUMAB; HYALURONIDASE (dar a toom ue mab; hye al ur ON i dase) treats multiple myeloma, a type of bone marrow cancer. Daratumumab works by blocking a protein that causes cancer cells to grow and multiply. This helps to slow or stop the spread of cancer cells. Hyaluronidase works by increasing the absorption of other medications in the body to help them work better. This medication may also be used treat amyloidosis, a condition that causes the buildup of a protein (amyloid) in your body. It works by reducing the buildup of this protein, which decreases symptoms. It is a combination medication that contains a monoclonal antibody. This medicine may be used for other purposes; ask your health care provider or pharmacist if you have  questions. COMMON BRAND NAME(S): DARZALEX FASPRO What should I tell my care team before I take this medication? They need to know if you have any of these conditions: Heart disease Infection, such as chickenpox, cold sores, herpes, hepatitis B Lung or breathing disease An unusual or allergic reaction to daratumumab, hyaluronidase, other medications, foods, dyes, or preservatives Pregnant or trying to get pregnant Breast-feeding How should I use this medication? This medication is injected under the skin. It is given by your care team in a hospital or clinic setting. Talk to your care team about the use of this medication in children. Special care may be needed. Overdosage: If you think you have taken too much of this medicine contact a poison control center or emergency room at once. NOTE: This medicine is only for you. Do not share this medicine with others. What if I miss a dose? Keep appointments for follow-up doses. It is important not to miss your dose. Call your care team if you are unable to keep an appointment. What may interact with this medication? Interactions have not been studied. This list may not describe all possible interactions. Give your health care provider a list of all the medicines, herbs, non-prescription drugs, or dietary supplements you use. Also tell them if you smoke, drink alcohol, or use illegal drugs. Some items may interact with your medicine. What should I watch for while using this medication? Your condition will be monitored carefully while you are receiving this medication. This medication can cause serious allergic reactions. To reduce your risk, your care team   may give you other medication to take before receiving this one. Be sure to follow the directions from your care team. This medication can affect the results of blood tests to match your blood type. These changes can last for up to 6 months after the final dose. Your care team will do blood tests to  match your blood type before you start treatment. Tell all of your care team that you are being treated with this medication before receiving a blood transfusion. This medication can affect the results of some tests used to determine treatment response; extra tests may be needed to evaluate response. Talk to your care team if you wish to become pregnant or think you are pregnant. This medication can cause serious birth defects if taken during pregnancy and for 3 months after the last dose. A reliable form of contraception is recommended while taking this medication and for 3 months after the last dose. Talk to your care team about effective forms of contraception. Do not breast-feed while taking this medication. What side effects may I notice from receiving this medication? Side effects that you should report to your care team as soon as possible: Allergic reactions--skin rash, itching, hives, swelling of the face, lips, tongue, or throat Heart rhythm changes--fast or irregular heartbeat, dizziness, feeling faint or lightheaded, chest pain, trouble breathing Infection--fever, chills, cough, sore throat, wounds that don't heal, pain or trouble when passing urine, general feeling of discomfort or being unwell Infusion reactions--chest pain, shortness of breath or trouble breathing, feeling faint or lightheaded Sudden eye pain or change in vision such as blurry vision, seeing halos around lights, vision loss Unusual bruising or bleeding Side effects that usually do not require medical attention (report to your care team if they continue or are bothersome): Constipation Diarrhea Fatigue Nausea Pain, tingling, or numbness in the hands or feet Swelling of the ankles, hands, or feet This list may not describe all possible side effects. Call your doctor for medical advice about side effects. You may report side effects to FDA at 1-800-FDA-1088. Where should I keep my medication? This medication is given  in a hospital or clinic. It will not be stored at home. NOTE: This sheet is a summary. It may not cover all possible information. If you have questions about this medicine, talk to your doctor, pharmacist, or health care provider.  2024 Elsevier/Gold Standard (2021-05-06 00:00:00)    Zoledronic Acid Injection (Cancer) What is this medication? ZOLEDRONIC ACID (ZOE le dron ik AS id) treats high calcium levels in the blood caused by cancer. It may also be used with chemotherapy to treat weakened bones caused by cancer. It works by slowing down the release of calcium from bones. This lowers calcium levels in your blood. It also makes your bones stronger and less likely to break (fracture). It belongs to a group of medications called bisphosphonates. This medicine may be used for other purposes; ask your health care provider or pharmacist if you have questions. COMMON BRAND NAME(S): Zometa, Zometa Powder What should I tell my care team before I take this medication? They need to know if you have any of these conditions: Dehydration Dental disease Kidney disease Liver disease Low levels of calcium in the blood Lung or breathing disease, such as asthma Receiving steroids, such as dexamethasone or prednisone An unusual or allergic reaction to zoledronic acid, other medications, foods, dyes, or preservatives Pregnant or trying to get pregnant Breast-feeding How should I use this medication? This medication is injected into   a vein. It is given by your care team in a hospital or clinic setting. Talk to your care team about the use of this medication in children. Special care may be needed. Overdosage: If you think you have taken too much of this medicine contact a poison control center or emergency room at once. NOTE: This medicine is only for you. Do not share this medicine with others. What if I miss a dose? Keep appointments for follow-up doses. It is important not to miss your dose. Call your  care team if you are unable to keep an appointment. What may interact with this medication? Certain antibiotics given by injection Diuretics, such as bumetanide, furosemide NSAIDs, medications for pain and inflammation, such as ibuprofen or naproxen Teriparatide Thalidomide This list may not describe all possible interactions. Give your health care provider a list of all the medicines, herbs, non-prescription drugs, or dietary supplements you use. Also tell them if you smoke, drink alcohol, or use illegal drugs. Some items may interact with your medicine. What should I watch for while using this medication? Visit your care team for regular checks on your progress. It may be some time before you see the benefit from this medication. Some people who take this medication have severe bone, joint, or muscle pain. This medication may also increase your risk for jaw problems or a broken thigh bone. Tell your care team right away if you have severe pain in your jaw, bones, joints, or muscles. Tell you care team if you have any pain that does not go away or that gets worse. Tell your dentist and dental surgeon that you are taking this medication. You should not have major dental surgery while on this medication. See your dentist to have a dental exam and fix any dental problems before starting this medication. Take good care of your teeth while on this medication. Make sure you see your dentist for regular follow-up appointments. You should make sure you get enough calcium and vitamin D while you are taking this medication. Discuss the foods you eat and the vitamins you take with your care team. Check with your care team if you have severe diarrhea, nausea, and vomiting, or if you sweat a lot. The loss of too much body fluid may make it dangerous for you to take this medication. You may need bloodwork while taking this medication. Talk to your care team if you wish to become pregnant or think you might be  pregnant. This medication can cause serious birth defects. What side effects may I notice from receiving this medication? Side effects that you should report to your care team as soon as possible: Allergic reactions--skin rash, itching, hives, swelling of the face, lips, tongue, or throat Kidney injury--decrease in the amount of urine, swelling of the ankles, hands, or feet Low calcium level--muscle pain or cramps, confusion, tingling, or numbness in the hands or feet Osteonecrosis of the jaw--pain, swelling, or redness in the mouth, numbness of the jaw, poor healing after dental work, unusual discharge from the mouth, visible bones in the mouth Severe bone, joint, or muscle pain Side effects that usually do not require medical attention (report to your care team if they continue or are bothersome): Constipation Fatigue Fever Loss of appetite Nausea Stomach pain This list may not describe all possible side effects. Call your doctor for medical advice about side effects. You may report side effects to FDA at 1-800-FDA-1088. Where should I keep my medication? This medication is given in   a hospital or clinic. It will not be stored at home. NOTE: This sheet is a summary. It may not cover all possible information. If you have questions about this medicine, talk to your doctor, pharmacist, or health care provider.  2024 Elsevier/Gold Standard (2021-02-21 00:00:00)        To help prevent nausea and vomiting after your treatment, we encourage you to take your nausea medication as directed.  BELOW ARE SYMPTOMS THAT SHOULD BE REPORTED IMMEDIATELY: *FEVER GREATER THAN 100.4 F (38 C) OR HIGHER *CHILLS OR SWEATING *NAUSEA AND VOMITING THAT IS NOT CONTROLLED WITH YOUR NAUSEA MEDICATION *UNUSUAL SHORTNESS OF BREATH *UNUSUAL BRUISING OR BLEEDING *URINARY PROBLEMS (pain or burning when urinating, or frequent urination) *BOWEL PROBLEMS (unusual diarrhea, constipation, pain near the anus) TENDERNESS  IN MOUTH AND THROAT WITH OR WITHOUT PRESENCE OF ULCERS (sore throat, sores in mouth, or a toothache) UNUSUAL RASH, SWELLING OR PAIN  UNUSUAL VAGINAL DISCHARGE OR ITCHING   Items with * indicate a potential emergency and should be followed up as soon as possible or go to the Emergency Department if any problems should occur.  Please show the CHEMOTHERAPY ALERT CARD or IMMUNOTHERAPY ALERT CARD at check-in to the Emergency Department and triage nurse.  Should you have questions after your visit or need to cancel or reschedule your appointment, please contact [facility] [phone]  and follow the prompts.  Office hours are 8:00 a.m. to 4:30 p.m. Monday - Friday. Please note that voicemails left after 4:00 p.m. may not be returned until the following business day.  We are closed weekends and major holidays. You have access to a nurse at all times for urgent questions. Please call the main number to the clinic [phone] and follow the prompts.  For any non-urgent questions, you may also contact your provider using MyChart. We now offer e-Visits for anyone 18 and older to request care online for non-urgent symptoms. For details visit [email or web address].   Also download the MyChart app! Go to the app store, search "MyChart", open the app, select [facility], and log in with your MyChart username and password.   

## 2022-09-01 NOTE — Progress Notes (Signed)
Patient presents today for Daratumuamb injection and Zometa infusion. Patient is in satisfactory condition with no new complaints voiced.  Vital signs are stable.  Labs reviewed by Dr. Ellin Saba during the office visit and all labs are within treatment parameters.  We will proceed with treatments per MD orders.   Patient tolerated treatments well with no complaints voiced.  Patient left ambulatory in stable condition.  Vital signs stable at discharge.  Follow up as scheduled.

## 2022-09-01 NOTE — Progress Notes (Signed)
Patient is taking Revlimid as prescribed. He has not missed any doses and reports no side effects at this time.    Patient has been examined by Dr. Ellin Saba. Vital signs and labs have been reviewed by MD - ANC (1.2), Creatinine, LFTs, hemoglobin, and platelets are within treatment parameters per M.D. - pt may proceed with treatment.  Primary RN and pharmacy notified.

## 2022-09-01 NOTE — Progress Notes (Signed)
[name] [address], [place] [address]    Clinic Day:  09/01/2022  Referring physician: Anabel Halon, MD  Patient Care Team: Anabel Halon, MD as PCP - General (Internal Medicine) Wyline Mood Dorothe Pea, MD as PCP - Cardiology (Cardiology) Lanelle Bal, DO as Consulting Physician (Internal Medicine) Therese Sarah, RN as Oncology Nurse Navigator (Oncology) Doreatha Massed, MD as Medical Oncologist (Oncology)   ASSESSMENT & PLAN:   Assessment: 1.  Standard risk plasma cell myeloma: - He reported low back pain radiating to the right thigh since April, pain gets worse when he walks for more than 2 blocks. - He had history of MGUS and was last seen in our clinic in 2019.  He had an abnormal M spike of 3.2 g on 12/06/2018. - Skeletal survey on 10/22/2017 showed no suspicious lytic lesions. - X-ray of the right sided pelvis on 06/11/2020 showed prominent lytic lesions noted in the proximal midportion of the right femoral diaphysis with no evidence of fracture or dislocation. - Bone marrow biopsy on 07/09/2020 with hypercellular marrow with trilineage hematopoiesis.  Plasma cells representing 10% of cells. - Chromosome analysis 46, XY (20).  Multiple myeloma FISH panel negative. - PET scan on 07/04/2020 with multiple hypermetabolic bony soft tissue lesions.  Dominant lesions in the L5 vertebral body and posterior right acetabulum and distal right femur.  Focal increased uptake in the right tonsillar region with associated soft tissue fullness on CT imaging. - 24-hour urine with total protein 134. - Beta-2 microglobulin 2.1 (06/26/2020), LDH 136. - Dara RVD cycle 1 started on 09/03/2020. - He had stem cell collection at [name]  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 - Maintenance daratumumab monthly and Revlimid 10 mg 3 weeks on/1 week off started on 04/08/2021. - We reviewed PET scan (07/03/2021): Lytic soft tissue lesion in the right L5  vertebral body measures 3.7 x 2.8 cm SUV 2.9, previously 3.4 x 3.4 cm, SUV 6.6.  Soft tissue lesion in the posterior right acetabulum measures 2.2 cm SUV 2.5, previously 2.2 cm with SUV 9.  Hypermetabolism in the left tonsil SUV 5.9.  Direct oropharyngeal exam did not show any mass.  New mildly hypermetabolic left lower pulmonary nodules nonspecific.  2.  Social/family history: - He works at [name] in environmental services. - He was never smoker. - 2 brothers died of metastatic lung cancer.  Sister has thyroid cancer.  Mother had breast cancer.    Plan: 1.  Stage I standard risk IgG kappa multiple myeloma: - He denies any infection in the past 3 months. - Reviewed myeloma labs from 08/04/2022: M spike is not observed.  FLC ratio is normal at 1.62. - Reviewed labs today: Normal CBC with ANC of 1.2.  LFTs are normal.  Creatinine and calcium are normal. - Recommend continuing Revlimid 10 mg 3 weeks on/1 week off.  Continue monthly Darzalex and dexamethasone 40 mg on days of Darzalex. - Recommend follow-up in 3 months with repeat labs.  2.  Left knee pain: - He was evaluated by Dr. Hilda Lias.  He is still continuing to have pain.  MRI was recommended by Dr. Hilda Lias.  3.  Infection prophylaxis: - Continue acyclovir for shingles prophylaxis.  Continue aspirin for thromboprophylaxis.  4.  Myeloma bone disease: - No dental issues.  Will switch Zometa to every 12 weeks.    Orders Placed This Encounter  Procedures   Kappa/lambda light chains  Standing Status:   Standing    Number of Occurrences:   10    Standing Expiration Date:   09/01/2023   Immunofixation electrophoresis    Standing Status:   Standing    Number of Occurrences:   10    Standing Expiration Date:   09/01/2023   Protein electrophoresis, serum    Standing Status:   Standing    Number of Occurrences:   10    Standing Expiration Date:   09/01/2023   CBC with Differential    Standing Status:   Future    Standing  Expiration Date:   09/29/2023   CBC with Differential    Standing Status:   Future    Standing Expiration Date:   10/27/2023   CBC with Differential    Standing Status:   Future    Standing Expiration Date:   11/24/2023   CBC with Differential    Standing Status:   Future    Standing Expiration Date:   12/22/2023      Mikeal Hawthorne R Teague,acting as a scribe for Doreatha Massed, MD.,have documented all relevant documentation on the behalf of Doreatha Massed, MD,as directed by  Doreatha Massed, MD while in the presence of Doreatha Massed, MD.  I, Doreatha Massed MD, have reviewed the above documentation for accuracy and completeness, and I agree with the above.    Doreatha Massed, MD   8/20/20246:31 PM  CHIEF COMPLAINT:   Diagnosis: multiple myeloma    Cancer Staging  Multiple myeloma without remission (HCC) Staging form: Plasma Cell Myeloma and Plasma Cell Disorders, AJCC 8th Edition - Clinical stage from 07/24/2020: RISS Stage I (Beta-2-microglobulin (mg/L): 2.1, Albumin (g/dL): 3.7, ISS: Stage I, High-risk cytogenetics: Absent, LDH: Normal) - Signed by Artis Delay, MD on 07/24/2020    Prior Therapy: DaraVRd 09/03/20 - 02/26/21  Current Therapy:  maintenance Revlimid/daratumumab   HISTORY OF PRESENT ILLNESS:   Oncology History  Multiple myeloma without remission (HCC)  07/09/2020 Pathology Results   BONE MARROW, ASPIRATE, CLOT, CORE:  -Hypercellular bone marrow with plasma cell neoplasm  -See comment   PERIPHERAL BLOOD:  -Slight macrocytic anemia   COMMENT:   The bone marrow is hypercellular for age with trilineage hematopoiesis and nonspecific myeloid changes.  In this background, the plasma cells are increased in number representing 10% of all cells associated with atypical cytomorphologic features.  The plasma cells display kappa light chain restriction consistent with plasma cell neoplasm.  Correlation with cytogenetic and FISH studies is  recommended.    07/24/2020 Initial Diagnosis   Multiple myeloma without remission (HCC)   07/24/2020 Cancer Staging   Staging form: Plasma Cell Myeloma and Plasma Cell Disorders, AJCC 8th Edition - Clinical stage from 07/24/2020: RISS Stage I (Beta-2-microglobulin (mg/L): 2.1, Albumin (g/dL): 3.7, ISS: Stage I, High-risk cytogenetics: Absent, LDH: Normal) - Signed by Artis Delay, MD on 07/24/2020 Stage prefix: Initial diagnosis Beta 2 microglobulin range (mg/L): Less than 3.5 Albumin range (g/dL): Greater than or equal to 3.5 Cytogenetics: No abnormalities   09/03/2020 - 09/02/2021 Chemotherapy   Patient is on Treatment Plan : MYELOMA NEWLY DIAGNOSED TRANSPLANT CANDIDATE DaraVRd (Daratumumab SQ) q21d x 6 Cycles (Induction/Consolidation)     04/16/2021 -  Chemotherapy   Patient is on Treatment Plan : MYELOMA Daratumumab SQ q28d        INTERVAL HISTORY:   Eugene [name] is a 67 y.o. male presenting to clinic today for follow up of multiple myeloma. He was last seen by me on 06/09/22.  Since his  last visit, he underwent DG of the left knee on 06/09/22 that found: 2 well corticated ossicles at the superior aspect of the tibial tubercle at the patellar tendon insertion, likely the sequela of remote Osgood-Schlatter disease; no adjacent soft tissue swelling is seen to suggest an acute component; tiny ossicles overlying the proximal patellar tendon on lateral view, likely reflect chronic enthesopathic changes/the sequela of chronic tendinosis; and there may be mild soft tissue swelling just anterior to this region suggesting this may represent an active stress related/inflammatory process.     Today, he states that he is doing well overall. His appetite level is at 100%. His energy level is at 50%.  He notes left knee pain is consistent and has not worsened, though he had a brief period after his scan during which he had no pain. It still hurts to bend his left knee, particularly when walking. He currently wears  a knee brace that minimally improves the pain. There was nothing abnormal found in left knee to contribute to pain on his X ray and he will get an MRI sometime in the near future. He denies any issues with Revlimid or his Zometa shot, including bone pains. He denies any recent infections.   PAST MEDICAL HISTORY:   Past Medical History: Past Medical History:  Diagnosis Date   Back pain    CAD (coronary artery disease)    a. 01/2017 NSTEMI/Cath: LM nl, LAD 25p - ? hypodense but no filling defect, RI nl, LCX nl, RCA nl, EF 55-65%-->Med Rx.   Erectile dysfunction    History of echocardiogram    a. 01/2017 Echo: EF 55-60%, no rwma.   Hypertension     Surgical History: Past Surgical History:  Procedure Laterality Date   LEFT HEART CATH AND CORONARY ANGIOGRAPHY N/A 01/15/2017   Procedure: LEFT HEART CATH AND CORONARY ANGIOGRAPHY;  Surgeon: [place]az[place], Peter M, MD;  Location: [name]VASIVE CV [facility];  Service: Cardiovascular;  Laterality: N/A;   ORIF trocanteric femoral IM Nail Right     Social History: Social History   Socioeconomic History   Marital status: Significant Other    Spouse name: Not on file   Number of children: Not on file   Years of education: Not on file   Highest education level: Not on file  Occupational History   Not on file  Tobacco Use   Smoking status: Never   Smokeless tobacco: Never  Vaping Use   Vaping status: Never Used  Substance and Sexual Activity   Alcohol use: No   Drug use: No   Sexual activity: Yes  Other Topics Concern   Not on file  Social History Narrative   Not on file   Social Determinants of Health   Financial Resource Strain: Low Risk  (06/26/2020)   Overall Financial Resource Strain (CARDIA)    Difficulty of Paying Living Expenses: Not hard at all  Food Insecurity: No Food Insecurity (06/26/2020)   Hunger Vital Sign    Worried About Running Out of Food in the Last Year: Never true    Ran Out of Food in the Last Year: Never true   Transportation Needs: No Transportation Needs (06/26/2020)   PRAPARE - Administrator, Civil Service (Medical): No    Lack of Transportation (Non-Medical): No  Physical Activity: Insufficiently Active (06/26/2020)   Exercise Vital Sign    Days of Exercise per Week: 2 days    Minutes of Exercise per Session: 10 min  Stress: No Stress  Concern Present (06/26/2020)   [employer] of Occupational Health - Occupational Stress Questionnaire    Feeling of Stress : Not at all  Social Connections: Moderately Integrated (06/26/2020)   Social Connection and Isolation Panel [NHANES]    Frequency of Communication with Friends and Family: More than three times a week    Frequency of Social Gatherings with Friends and Family: More than three times a week    Attends Religious Services: 1 to 4 times per year    Active Member of [employer] or Organizations: No    Attends Banker Meetings: 1 to 4 times per year    Marital Status: Divorced  Catering manager Violence: Not At Risk (06/26/2020)   Humiliation, Afraid, Rape, and Kick questionnaire    Fear of Current or Ex-Partner: No    Emotionally Abused: No    Physically Abused: No    Sexually Abused: No    Family History: Family History  Problem Relation Age of Onset   Cancer Mother        breast   Diabetes Mother    Diabetes Sister    Hypertension Sister    Cancer Brother    Mental illness Sister     Current Medications:  Current Outpatient Medications:    acyclovir (ZOVIRAX) 400 MG tablet, Take 1 tablet (400 mg total) by mouth 2 (two) times daily., Disp: 60 tablet, Rfl: 5   aspirin EC 81 MG tablet, Take by mouth., Disp: , Rfl:    calcium-vitamin D (OSCAL WITH D) 500-200 MG-UNIT tablet, Take 1 tablet by mouth., Disp: , Rfl:    dexamethasone (DECADRON) 4 MG tablet, Take 10 tablets (40 mg total) by mouth once a week., Disp: 40 tablet, Rfl: 6   lenalidomide (REVLIMID) 10 MG capsule, TAKE 1 CAPSULE BY MOUTH 1 TIME A DAY FOR  21 DAYS ON THEN 7 DAYS OFF, Disp: 21 capsule, Rfl: 0   metoprolol succinate (TOPROL-XL) 25 MG 24 hr tablet, Take 1 tablet (25 mg total) by mouth daily., Disp: 90 tablet, Rfl: 3   Multiple Vitamin (MULTIVITAMIN) tablet, Take 1 tablet by mouth daily.  , Disp: , Rfl:    Multiple Vitamins/Iron TABS, Take by mouth., Disp: , Rfl:    ondansetron (ZOFRAN) 8 MG tablet, , Disp: , Rfl:    prochlorperazine (COMPAZINE) 10 MG tablet, , Disp: , Rfl:    rosuvastatin (CRESTOR) 40 MG tablet, Take 1 tablet (40 mg total) by mouth every other day., Disp: 90 tablet, Rfl: 1   traMADol (ULTRAM) 50 MG tablet, Take 1 tablet (50 mg total) by mouth every 6 (six) hours as needed., Disp: 60 tablet, Rfl: 0   nitroGLYCERIN (NITROSTAT) 0.4 MG SL tablet, Place 1 tablet (0.4 mg total) under the tongue every 5 (five) minutes for 3 doses as needed for chest pain., Disp: 25 tablet, Rfl: 3   Allergies: No Known Allergies  REVIEW OF SYSTEMS:   Review of Systems  Constitutional:  Negative for chills, fatigue and fever.  HENT:   Negative for lump/mass, mouth sores, nosebleeds, sore throat and trouble swallowing.   Eyes:  Negative for eye problems.  Respiratory:  Negative for cough and shortness of breath.   Cardiovascular:  Negative for chest pain, leg swelling and palpitations.  Gastrointestinal:  Negative for abdominal pain, constipation, diarrhea, nausea and vomiting.  Genitourinary:  Negative for bladder incontinence, difficulty urinating, dysuria, frequency, hematuria and nocturia.   Musculoskeletal:  Positive for arthralgias (in right knee, 6/10 severity). Negative for back pain, flank  pain, myalgias and neck pain.  Skin:  Negative for itching and rash.  Neurological:  Negative for dizziness, headaches and numbness.  Hematological:  Does not bruise/bleed easily.  Psychiatric/Behavioral:  Negative for depression, sleep disturbance and suicidal ideas. The patient is not nervous/anxious.   All other systems reviewed and are  negative.    VITALS:   Blood pressure 128/75, pulse 60, temperature 97.9 F (36.6 C), temperature source Oral, resp. rate 16, weight 239 lb 3.2 oz (108.5 kg), SpO2 99%.  Wt Readings from Last 3 Encounters:  09/01/22 239 lb 3.2 oz (108.5 kg)  08/04/22 237 lb (107.5 kg)  07/29/22 234 lb (106.1 kg)    Body mass index is 35.32 kg/m.  Performance status (ECOG): 1 - Symptomatic but completely ambulatory  PHYSICAL EXAM:   Physical Exam Vitals and nursing note reviewed. Exam conducted with a chaperone present.  Constitutional:      Appearance: Normal appearance.  Cardiovascular:     Rate and Rhythm: Normal rate and regular rhythm.     Pulses: Normal pulses.     Heart sounds: Normal heart sounds.  Pulmonary:     Effort: Pulmonary effort is normal.     Breath sounds: Normal breath sounds.  Abdominal:     Palpations: Abdomen is soft. There is no hepatomegaly, splenomegaly or mass.     Tenderness: There is no abdominal tenderness.  Musculoskeletal:     Right lower leg: No edema.     Left lower leg: No edema.  Lymphadenopathy:     Cervical: No cervical adenopathy.     Right cervical: No superficial, deep or posterior cervical adenopathy.    Left cervical: No superficial, deep or posterior cervical adenopathy.     Upper Body:     Right upper body: No supraclavicular or axillary adenopathy.     Left upper body: No supraclavicular or axillary adenopathy.  Neurological:     General: No focal deficit present.     Mental Status: He is alert and oriented to person, place, and time.  Psychiatric:        Mood and Affect: Mood normal.        Behavior: Behavior normal.     LABS:      Latest Ref Rng & Units 09/01/2022   12:25 PM 08/04/2022   12:03 PM 07/07/2022   12:20 PM  CBC  WBC 4.0 - 10.5 K/uL 4.2  3.8  3.9   Hemoglobin 13.0 - 17.0 g/dL 44.0  34.7  42.5   Hematocrit 39.0 - 52.0 % 41.1  41.4  41.6   Platelets 150 - 400 K/uL 216  234  213       Latest Ref Rng & Units 09/01/2022    12:25 PM 08/04/2022   12:03 PM 07/07/2022   12:20 PM  CMP  Glucose 70 - 99 mg/dL 956  387  564   BUN 8 - 23 mg/dL 11  11  12    Creatinine 0.61 - 1.24 mg/dL 3.32  9.51  8.84   Sodium 135 - 145 mmol/L 136  136  137   Potassium 3.5 - 5.1 mmol/L 3.6  3.7  4.2   Chloride 98 - 111 mmol/L 101  101  100   CO2 22 - 32 mmol/L 24  25  28    Calcium 8.9 - 10.3 mg/dL 8.7  8.9  8.8   Total Protein 6.5 - 8.1 g/dL 6.3  7.0  6.9   Total Bilirubin 0.3 - 1.2 mg/dL 0.6  1.1  0.9   Alkaline Phos 38 - 126 U/L 59  58  52   AST 15 - 41 U/L 26  38  25   ALT 0 - 44 U/L 26  29  24       No results found for: "CEA1", "CEA" / No results found for: "CEA1", "CEA" Lab Results  Component Value Date   PSA1 0.5 07/16/2020   No results found for: "WUJ811" No results found for: "CAN125"  Lab Results  Component Value Date   TOTALPROTELP 6.1 08/04/2022   ALBUMINELP 3.5 08/04/2022   A1GS 0.2 08/04/2022   A2GS 0.6 08/04/2022   BETS 0.9 08/04/2022   BETA2SER 0.3 12/07/2018   GAMS 0.8 08/04/2022   MSPIKE Not Observed 08/04/2022   SPEI Comment 08/04/2022   Lab Results  Component Value Date   FERRITIN 210 10/05/2017   Lab Results  Component Value Date   LDH 108 07/07/2022   LDH 116 06/09/2022   LDH 106 03/10/2022     STUDIES:   No results found.

## 2022-09-01 NOTE — Patient Instructions (Signed)

## 2022-09-11 ENCOUNTER — Other Ambulatory Visit: Payer: Self-pay | Admitting: Hematology

## 2022-09-11 NOTE — Telephone Encounter (Signed)
Chart reviewed. Revlimid refilled per last office note with Dr. Katragadda.  

## 2022-09-28 ENCOUNTER — Other Ambulatory Visit: Payer: Self-pay

## 2022-09-28 ENCOUNTER — Other Ambulatory Visit: Payer: Self-pay | Admitting: Hematology

## 2022-09-28 DIAGNOSIS — C9000 Multiple myeloma not having achieved remission: Secondary | ICD-10-CM

## 2022-09-29 ENCOUNTER — Inpatient Hospital Stay: Payer: Medicare Other

## 2022-09-29 ENCOUNTER — Inpatient Hospital Stay: Payer: Medicare Other | Attending: Hematology

## 2022-09-29 VITALS — BP 131/72 | HR 77 | Temp 97.3°F | Resp 18 | Ht 69.0 in | Wt 237.0 lb

## 2022-09-29 DIAGNOSIS — C9000 Multiple myeloma not having achieved remission: Secondary | ICD-10-CM | POA: Diagnosis present

## 2022-09-29 DIAGNOSIS — Z5112 Encounter for antineoplastic immunotherapy: Secondary | ICD-10-CM | POA: Insufficient documentation

## 2022-09-29 LAB — COMPREHENSIVE METABOLIC PANEL
ALT: 24 U/L (ref 0–44)
AST: 27 U/L (ref 15–41)
Albumin: 4 g/dL (ref 3.5–5.0)
Alkaline Phosphatase: 61 U/L (ref 38–126)
Anion gap: 10 (ref 5–15)
BUN: 11 mg/dL (ref 8–23)
CO2: 25 mmol/L (ref 22–32)
Calcium: 8.9 mg/dL (ref 8.9–10.3)
Chloride: 102 mmol/L (ref 98–111)
Creatinine, Ser: 1.13 mg/dL (ref 0.61–1.24)
GFR, Estimated: >60 mL/min (ref >60)
Glucose, Bld: 145 mg/dL — ABNORMAL HIGH (ref 70–99)
Potassium: 3.5 mmol/L (ref 3.5–5.1)
Sodium: 137 mmol/L (ref 135–145)
Total Bilirubin: 0.9 mg/dL (ref 0.3–1.2)
Total Protein: 6.9 g/dL (ref 6.5–8.1)

## 2022-09-29 LAB — CBC WITH DIFFERENTIAL/PLATELET
Abs Immature Granulocytes: 0.02 10*3/uL (ref 0.00–0.07)
Basophils Absolute: 0 10*3/uL (ref 0.0–0.1)
Basophils Relative: 1 %
Eosinophils Absolute: 0.2 10*3/uL (ref 0.0–0.5)
Eosinophils Relative: 4 %
HCT: 43 % (ref 39.0–52.0)
Hemoglobin: 14.2 g/dL (ref 13.0–17.0)
Immature Granulocytes: 0 %
Lymphocytes Relative: 60 %
Lymphs Abs: 2.9 10*3/uL (ref 0.7–4.0)
MCH: 31.9 pg (ref 26.0–34.0)
MCHC: 33 g/dL (ref 30.0–36.0)
MCV: 96.6 fL (ref 80.0–100.0)
Monocytes Absolute: 0.4 10*3/uL (ref 0.1–1.0)
Monocytes Relative: 8 %
Neutro Abs: 1.3 10*3/uL — ABNORMAL LOW (ref 1.7–7.7)
Neutrophils Relative %: 27 %
Platelets: 241 10*3/uL (ref 150–400)
RBC: 4.45 MIL/uL (ref 4.22–5.81)
RDW: 12.9 % (ref 11.5–15.5)
WBC: 4.9 10*3/uL (ref 4.0–10.5)
nRBC: 0 % (ref 0.0–0.2)

## 2022-09-29 LAB — MAGNESIUM: Magnesium: 1.7 mg/dL (ref 1.7–2.4)

## 2022-09-29 MED ORDER — CETIRIZINE HCL 10 MG PO TABS
10.0000 mg | ORAL_TABLET | Freq: Once | ORAL | Status: AC
  Administered 2022-09-29: 10 mg via ORAL
  Filled 2022-09-29: qty 1

## 2022-09-29 MED ORDER — DARATUMUMAB-HYALURONIDASE-FIHJ 1800-30000 MG-UT/15ML ~~LOC~~ SOLN
1800.0000 mg | Freq: Once | SUBCUTANEOUS | Status: AC
  Administered 2022-09-29: 1800 mg via SUBCUTANEOUS
  Filled 2022-09-29: qty 15

## 2022-09-29 NOTE — Patient Instructions (Signed)
[facility]  Discharge Instructions: Thank you for choosing [facility] to provide your oncology and hematology care.  If you have a lab appointment with the Cancer Center - please note that after April 8th, 2024, all labs will be drawn in the cancer center.  You do not have to check in or register with the main entrance as you have in the past but will complete your check-in in the cancer center.  Wear comfortable clothing and clothing appropriate for easy access to any Portacath or PICC line.   We strive to give you quality time with your provider. You may need to reschedule your appointment if you arrive late (15 or more minutes).  Arriving late affects you and other patients whose appointments are after yours.  Also, if you miss three or more appointments without notifying the office, you may be dismissed from the clinic at the provider's discretion.      For prescription refill requests, have your pharmacy contact our office and allow 72 hours for refills to be completed.    Today you received the following chemotherapy and/or immunotherapy agents Daratumumab.   Daratumumab Injection What is this medication? DARATUMUMAB (dar a toom ue mab) treats multiple myeloma, a type of bone marrow cancer. It works by helping your immune system slow or stop the spread of cancer cells. It is a monoclonal antibody. This medicine may be used for other purposes; ask your health care provider or pharmacist if you have questions. COMMON BRAND NAME(S): DARZALEX What should I tell my care team before I take this medication? They need to know if you have any of these conditions: Hereditary fructose intolerance Infection, such as chickenpox, herpes, hepatitis B Lung or breathing disease, such as asthma, COPD An unusual or allergic reaction to daratumumab, sorbitol, other medications, foods, dyes, or preservatives Pregnant or trying to get pregnant Breastfeeding How should I  use this medication? This medication is injected into a vein. It is given by your care team in a hospital or clinic setting. Talk to your care team about the use of this medication in children. Special care may be needed. Overdosage: If you think you have taken too much of this medicine contact a poison control center or emergency room at once. NOTE: This medicine is only for you. Do not share this medicine with others. What if I miss a dose? Keep appointments for follow-up doses. It is important not to miss your dose. Call your care team if you are unable to keep an appointment. What may interact with this medication? Interactions have not been studied. This list may not describe all possible interactions. Give your health care provider a list of all the medicines, herbs, non-prescription drugs, or dietary supplements you use. Also tell them if you smoke, drink alcohol, or use illegal drugs. Some items may interact with your medicine. What should I watch for while using this medication? Your condition will be monitored carefully while you are receiving this medication. This medication can cause serious allergic reactions. To reduce your risk, your care team may give you other medication to take before receiving this one. Be sure to follow the directions from your care team. This medication can affect the results of blood tests to match your blood type. These changes can last for up to 6 months after the final dose. Your care team will do blood tests to match your blood type before you start treatment. Tell all of your care team that  you are being treated with this medication before receiving a blood transfusion. This medication can affect the results of some tests used to determine treatment response; extra tests may be needed to evaluate response. Talk to your care team if you wish to become pregnant or think you are pregnant. This medication can cause serious birth defects if taken during pregnancy  and for 3 months after the last dose. A reliable form of contraception is recommended while taking this medication and for 3 months after the last dose. Talk to your care team about effective forms of contraception. Do not breast-feed while taking this medication. What side effects may I notice from receiving this medication? Side effects that you should report to your care team as soon as possible: Allergic reactions--skin rash, itching, hives, swelling of the face, lips, tongue, or throat Infection--fever, chills, cough, sore throat, wounds that don't heal, pain or trouble when passing urine, general feeling of discomfort or being unwell Infusion reactions--chest pain, shortness of breath or trouble breathing, feeling faint or lightheaded Unusual bruising or bleeding Side effects that usually do not require medical attention (report to your care team if they continue or are bothersome): Constipation Diarrhea Fatigue Nausea Pain, tingling, or numbness in the hands or feet Swelling of the ankles, hands, or feet This list may not describe all possible side effects. Call your doctor for medical advice about side effects. You may report side effects to FDA at 1-800-FDA-1088. Where should I keep my medication? This medication is given in a hospital or clinic. It will not be stored at home. NOTE: This sheet is a summary. It may not cover all possible information. If you have questions about this medicine, talk to your doctor, pharmacist, or health care provider.  2024 Elsevier/Gold Standard (2021-11-06 00:00:00)       To help prevent nausea and vomiting after your treatment, we encourage you to take your nausea medication as directed.  BELOW ARE SYMPTOMS THAT SHOULD BE REPORTED IMMEDIATELY: *FEVER GREATER THAN 100.4 F (38 C) OR HIGHER *CHILLS OR SWEATING *NAUSEA AND VOMITING THAT IS NOT CONTROLLED WITH YOUR NAUSEA MEDICATION *UNUSUAL SHORTNESS OF BREATH *UNUSUAL BRUISING OR  BLEEDING *URINARY PROBLEMS (pain or burning when urinating, or frequent urination) *BOWEL PROBLEMS (unusual diarrhea, constipation, pain near the anus) TENDERNESS IN MOUTH AND THROAT WITH OR WITHOUT PRESENCE OF ULCERS (sore throat, sores in mouth, or a toothache) UNUSUAL RASH, SWELLING OR PAIN  UNUSUAL VAGINAL DISCHARGE OR ITCHING   Items with * indicate a potential emergency and should be followed up as soon as possible or go to the Emergency Department if any problems should occur.  Please show the CHEMOTHERAPY ALERT CARD or IMMUNOTHERAPY ALERT CARD at check-in to the Emergency Department and triage nurse.  Should you have questions after your visit or need to cancel or reschedule your appointment, please contact [facility] [phone]  and follow the prompts.  Office hours are 8:00 a.m. to 4:30 p.m. Monday - Friday. Please note that voicemails left after 4:00 p.m. may not be returned until the following business day.  We are closed weekends and major holidays. You have access to a nurse at all times for urgent questions. Please call the main number to the clinic [phone] and follow the prompts.  For any non-urgent questions, you may also contact your provider using MyChart. We now offer e-Visits for anyone 54 and older to request care online for non-urgent symptoms. For details visit [email or web address].   Also download the MyChart  app! Go to the app store, search "MyChart", open the app, select [place], and log in with your MyChart username and password.

## 2022-09-29 NOTE — Progress Notes (Signed)
Patient presents today for chemotherapy infusion of Daratumumab. Patient is in satisfactory condition with no new complaints voiced.  Vital signs are stable.  Labs reviewed and all labs are within treatment parameters.  We will proceed with treatment per MD orders.    Patient tolerated treatment well with no complaints voiced.  Patient left ambulatory in stable condition.  Vital signs stable at discharge.  Follow up as scheduled.

## 2022-10-05 ENCOUNTER — Encounter (HOSPITAL_COMMUNITY): Payer: Self-pay

## 2022-10-05 ENCOUNTER — Emergency Department (HOSPITAL_COMMUNITY): Payer: Medicare Other

## 2022-10-05 ENCOUNTER — Emergency Department (HOSPITAL_COMMUNITY)
Admission: EM | Admit: 2022-10-05 | Discharge: 2022-10-05 | Disposition: A | Discharge status: Home / Self Care | Payer: Medicare Other | Attending: Emergency Medicine | Admitting: Emergency Medicine

## 2022-10-05 ENCOUNTER — Other Ambulatory Visit: Payer: Self-pay

## 2022-10-05 DIAGNOSIS — R0602 Shortness of breath: Secondary | ICD-10-CM | POA: Insufficient documentation

## 2022-10-05 DIAGNOSIS — Z7982 Long term (current) use of aspirin: Secondary | ICD-10-CM | POA: Insufficient documentation

## 2022-10-05 DIAGNOSIS — C9000 Multiple myeloma not having achieved remission: Secondary | ICD-10-CM | POA: Diagnosis not present

## 2022-10-05 DIAGNOSIS — R509 Fever, unspecified: Secondary | ICD-10-CM | POA: Diagnosis present

## 2022-10-05 DIAGNOSIS — U071 COVID-19: Secondary | ICD-10-CM | POA: Insufficient documentation

## 2022-10-05 DIAGNOSIS — Z8579 Personal history of other malignant neoplasms of lymphoid, hematopoietic and related tissues: Secondary | ICD-10-CM | POA: Insufficient documentation

## 2022-10-05 DIAGNOSIS — R059 Cough, unspecified: Secondary | ICD-10-CM | POA: Diagnosis not present

## 2022-10-05 DIAGNOSIS — R918 Other nonspecific abnormal finding of lung field: Secondary | ICD-10-CM | POA: Diagnosis not present

## 2022-10-05 LAB — CBC WITH DIFFERENTIAL/PLATELET
Abs Immature Granulocytes: 0.02 10*3/uL (ref 0.00–0.07)
Basophils Absolute: 0 10*3/uL (ref 0.0–0.1)
Basophils Relative: 0 %
Eosinophils Absolute: 0.3 10*3/uL (ref 0.0–0.5)
Eosinophils Relative: 5 %
HCT: 41.4 % (ref 39.0–52.0)
Hemoglobin: 13.6 g/dL (ref 13.0–17.0)
Immature Granulocytes: 0 %
Lymphocytes Relative: 16 %
Lymphs Abs: 0.9 10*3/uL (ref 0.7–4.0)
MCH: 32 pg (ref 26.0–34.0)
MCHC: 32.9 g/dL (ref 30.0–36.0)
MCV: 97.4 fL (ref 80.0–100.0)
Monocytes Absolute: 0.8 10*3/uL (ref 0.1–1.0)
Monocytes Relative: 13 %
Neutro Abs: 3.8 10*3/uL (ref 1.7–7.7)
Neutrophils Relative %: 66 %
Platelets: 166 10*3/uL (ref 150–400)
RBC: 4.25 MIL/uL (ref 4.22–5.81)
RDW: 13.2 % (ref 11.5–15.5)
WBC: 5.8 10*3/uL (ref 4.0–10.5)
nRBC: 0 % (ref 0.0–0.2)

## 2022-10-05 LAB — BASIC METABOLIC PANEL
Anion gap: 11 (ref 5–15)
BUN: 12 mg/dL (ref 8–23)
CO2: 25 mmol/L (ref 22–32)
Calcium: 8.6 mg/dL — ABNORMAL LOW (ref 8.9–10.3)
Chloride: 100 mmol/L (ref 98–111)
Creatinine, Ser: 1.21 mg/dL (ref 0.61–1.24)
GFR, Estimated: >60 mL/min (ref >60)
Glucose, Bld: 117 mg/dL — ABNORMAL HIGH (ref 70–99)
Potassium: 3.8 mmol/L (ref 3.5–5.1)
Sodium: 136 mmol/L (ref 135–145)

## 2022-10-05 LAB — BRAIN NATRIURETIC PEPTIDE: B Natriuretic Peptide: 25 pg/mL (ref 0.0–100.0)

## 2022-10-05 LAB — LACTIC ACID, PLASMA: Lactic Acid, Venous: 1.3 mmol/L (ref 0.5–1.9)

## 2022-10-05 LAB — RESP PANEL BY RT-PCR (RSV, FLU A&B, COVID)  RVPGX2
Influenza A by PCR: NEGATIVE
Influenza B by PCR: NEGATIVE
Resp Syncytial Virus by PCR: NEGATIVE
SARS Coronavirus 2 by RT PCR: POSITIVE — AB

## 2022-10-05 MED ORDER — PAXLOVID (150/100) 10 X 150 MG & 10 X 100MG PO TBPK: 2.0000 | ORAL_TABLET | Freq: Two times a day (BID) | ORAL | 0 refills | Status: AC

## 2022-10-05 NOTE — ED Provider Notes (Signed)
[facility]ERGENCY DEPARTMENT AT [facility] Provider Note   CSN: [id number] Arrival date & time: 10/05/22  0750     History  Chief Complaint  Patient presents with   Cough   Fever   Chills    Eugene [name] is a 67 y.o. male.  He has a history of multiple myeloma and is under active treatment with Dr. Ellin Saba.  He said he has been sick for 4 days with cough productive of yellow sputum.  Getting progressively worse.  Associated with chills and subjective fever.  No nausea vomiting diarrhea or urinary symptoms.  No sick contacts or recent travel.  The history is provided by the patient.  Cough Cough characteristics:  Productive Sputum characteristics:  Yellow Severity:  Moderate Onset quality:  Gradual Duration:  4 days Timing:  Intermittent Progression:  Worsening Chronicity:  New Smoker: no   Relieved by:  None tried Worsened by:  Nothing Ineffective treatments:  None tried Associated symptoms: chills and fever   Associated symptoms: no chest pain   Fever Associated symptoms: chills and cough   Associated symptoms: no chest pain and no dysuria        Home Medications Prior to Admission medications   Medication Sig Start Date End Date Taking? Authorizing Provider  acyclovir (ZOVIRAX) 400 MG tablet TAKE 1 TABLET BY MOUTH TWICE DAILY. 09/28/22   Doreatha Massed, MD  aspirin EC 81 MG tablet Take by mouth. 08/10/20   [provider]  calcium-vitamin D (OSCAL WITH D) 500-200 MG-UNIT tablet Take 1 tablet by mouth.    [provider]  dexamethasone (DECADRON) 4 MG tablet Take 10 tablets (40 mg total) by mouth once a week. 12/22/21   Doreatha Massed, MD  lenalidomide (REVLIMID) 10 MG capsule TAKE 1 CAPSULE BY MOUTH 1 TIME A DAY FOR 21 DAYS ON THEN 7 DAYS OFF 09/11/22   Doreatha Massed, MD  metoprolol succinate (TOPROL-XL) 25 MG 24 hr tablet Take 1 tablet (25 mg total) by mouth daily. 09/22/21   Antoine Poche, MD  Multiple  Vitamin (MULTIVITAMIN) tablet Take 1 tablet by mouth daily.      [provider]  Multiple Vitamins/Iron TABS Take by mouth.    [provider]  nitroGLYCERIN (NITROSTAT) 0.4 MG SL tablet Place 1 tablet (0.4 mg total) under the tongue every 5 (five) minutes for 3 doses as needed for chest pain. 07/29/22   Iran Ouch, Lennart Pall, PA-C  ondansetron [facility]) 8 MG tablet  07/24/20   [provider]  prochlorperazine (COMPAZINE) 10 MG tablet  07/24/20   [provider]  rosuvastatin (CRESTOR) 40 MG tablet Take 1 tablet (40 mg total) by mouth every other day. 12/11/21   Anabel Halon, MD  traMADol (ULTRAM) 50 MG tablet Take 1 tablet (50 mg total) by mouth every 6 (six) hours as needed. 09/03/20   Doreatha Massed, MD      Allergies    Patient has no known allergies.    Review of Systems   Review of Systems  Constitutional:  Positive for chills and fever.  Respiratory:  Positive for cough.   Cardiovascular:  Negative for chest pain.  Gastrointestinal:  Negative for abdominal pain.  Genitourinary:  Negative for dysuria.    Physical Exam Updated Vital Signs BP (!) 145/77 (BP Location: Right Arm)   Pulse 90   Temp 98.6 F (37 C)   Resp 16   Ht 5\' 9"  (1.753 m)   Wt 35 kg   SpO2  95%   BMI 11.39 kg/m  Physical Exam Vitals and nursing note reviewed.  Constitutional:      General: He is not in acute distress.    Appearance: Normal appearance. He is well-developed.  HENT:     Head: Normocephalic and atraumatic.  Eyes:     Conjunctiva/sclera: Conjunctivae normal.  Cardiovascular:     Rate and Rhythm: Normal rate and regular rhythm.     Heart sounds: No murmur heard. Pulmonary:     Effort: Pulmonary effort is normal. No respiratory distress.     Breath sounds: Normal breath sounds.  Abdominal:     Palpations: Abdomen is soft.     Tenderness: There is no abdominal tenderness. There is no guarding or rebound.  Musculoskeletal:        General: No  swelling.     Cervical back: Neck supple.  Skin:    General: Skin is warm and dry.     Capillary Refill: Capillary refill takes less than 2 seconds.  Neurological:     General: No focal deficit present.     Mental Status: He is alert.     ED Results / Procedures / Treatments   Labs (all labs ordered are listed, but only abnormal results are displayed) Labs Reviewed  RESP PANEL BY RT-PCR (RSV, FLU A&B, COVID)  RVPGX2 - Abnormal; Notable for the following components:      Result Value   SARS Coronavirus 2 by RT PCR POSITIVE (*)    All other components within normal limits  BASIC METABOLIC PANEL - Abnormal; Notable for the following components:   Glucose, Bld 117 (*)    Calcium 8.6 (*)    All other components within normal limits  CULTURE, BLOOD (ROUTINE X 2)  CULTURE, BLOOD (ROUTINE X 2)  CBC WITH DIFFERENTIAL/PLATELET  BRAIN NATRIURETIC PEPTIDE  LACTIC ACID, PLASMA    EKG None  Radiology DG Chest Port 1 View  Result Date: 10/05/2022 CLINICAL DATA:  Cough, chills and fevers over the last 3 days. EXAM: PORTABLE CHEST 1 VIEW COMPARISON:  09/06/2020 FINDINGS: Heart size upper limits of normal allowing for technical factors. Mediastinal shadows are normal. There is central bronchial thickening. There is patchy density in the left lower lobe consistent with bronchopneumonia. No dense consolidation or lobar collapse. IMPRESSION: Bronchitis pattern. Patchy density in the left lower lobe consistent with bronchopneumonia. Electronically Signed   By: Paulina Fusi M.D.   On: 10/05/2022 09:08    Procedures Procedures    Medications Ordered in ED Medications - No data to display  ED Course/ Medical Decision Making/ A&P Clinical Course as of 10/05/22 1659  Mon Oct 05, 2022  0911 Chest x-ray interpreted by me as possible early infiltrate left base.  Awaiting radiology reading. [MB]  1049 He does medications with pharmacy.  He does not have any contraindications other than he needs to  stop his cholesterol medicine on Paxlovid.  I reviewed this with patient and he is comfortable plan for discharge. [MB]    Clinical Course User Index [MB] Terrilee Files, MD                                 Medical Decision Making Amount and/or Complexity of Data Reviewed Labs: ordered. Radiology: ordered.  Risk Prescription drug management.   This patient complains of fevers chills cough; this involves an extensive number of treatment Options and is a complaint that carries with it  a high risk of complications and morbidity. The differential includes pneumonia, COVID, flu, bronchitis, sepsis  I ordered, reviewed and interpreted labs, which included CBC normal chemistries fairly unremarkable lactate normal BNP normal blood culture sent COVID-positive I ordered imaging studies which included chest x-ray and I independently    visualized and interpreted imaging which showed possible early left lower lobe Previous records obtained and reviewed in epic including recent oncology notes Cardiac monitoring reviewed, sinus rhythm Social determinants considered, no significant barriers Critical Interventions: None  After the interventions stated above, I reevaluated the patient and found patient to be well-appearing oxygenating well on room air Admission and further testing considered, no indications for admission at this time.  Will start on Paxlovid and recommended close follow-up with PCP and oncology.  Return instructions discussed         Final Clinical Impression(s) / ED Diagnoses Final diagnoses:  COVID-19 virus infection    Rx / DC Orders ED Discharge Orders          Ordered    nirmatrelvir & ritonavir (PAXLOVID, 150/100,) 10 x 150 MG & 10 x 100MG  TBPK  2 times daily        10/05/22 1058              Terrilee Files, MD 10/05/22 1701

## 2022-10-05 NOTE — ED Triage Notes (Signed)
Pt complains of cough, chills, and fever for 3 days.

## 2022-10-05 NOTE — Discharge Instructions (Addendum)
You were seen in the emergency department for cough fevers chills.  Your chest x-ray showed some possible pneumonia and your COVID test was positive.  We are treating you with Paxlovid which may help your symptoms.  Please stop your rosuvastatin while you are taking the Paxlovid.  Follow-up with Dr. Ellin Saba and your primary care doctor.  Rest and drink plenty of fluids.  Return to the emergency department if any worsening or concerning symptoms.

## 2022-10-07 ENCOUNTER — Other Ambulatory Visit: Payer: Self-pay | Admitting: Hematology

## 2022-10-07 ENCOUNTER — Other Ambulatory Visit: Payer: Self-pay | Admitting: Cardiology

## 2022-10-07 NOTE — Telephone Encounter (Signed)
Chart reviewed. Revlimid refilled per last office note with Dr. Katragadda.  

## 2022-10-08 LAB — CULTURE, BLOOD (ROUTINE X 2)

## 2022-10-10 LAB — CULTURE, BLOOD (ROUTINE X 2)
Culture: NO GROWTH
Culture: NO GROWTH

## 2022-10-26 ENCOUNTER — Other Ambulatory Visit: Payer: Self-pay

## 2022-10-26 DIAGNOSIS — C9000 Multiple myeloma not having achieved remission: Secondary | ICD-10-CM

## 2022-10-26 NOTE — Progress Notes (Signed)
Lab orders entered

## 2022-10-27 ENCOUNTER — Inpatient Hospital Stay: Payer: Medicare Other

## 2022-10-27 ENCOUNTER — Inpatient Hospital Stay: Payer: Medicare Other | Attending: Hematology

## 2022-10-27 VITALS — BP 123/76 | HR 61 | Temp 97.3°F | Resp 18

## 2022-10-27 DIAGNOSIS — Z5112 Encounter for antineoplastic immunotherapy: Secondary | ICD-10-CM | POA: Diagnosis present

## 2022-10-27 DIAGNOSIS — C9000 Multiple myeloma not having achieved remission: Secondary | ICD-10-CM | POA: Insufficient documentation

## 2022-10-27 LAB — CBC WITH DIFFERENTIAL/PLATELET
Abs Immature Granulocytes: 0.02 10*3/uL (ref 0.00–0.07)
Basophils Absolute: 0 10*3/uL (ref 0.0–0.1)
Basophils Relative: 1 %
Eosinophils Absolute: 0.3 10*3/uL (ref 0.0–0.5)
Eosinophils Relative: 6 %
HCT: 38.8 % — ABNORMAL LOW (ref 39.0–52.0)
Hemoglobin: 12.8 g/dL — ABNORMAL LOW (ref 13.0–17.0)
Immature Granulocytes: 0 %
Lymphocytes Relative: 56 %
Lymphs Abs: 2.8 10*3/uL (ref 0.7–4.0)
MCH: 32.1 pg (ref 26.0–34.0)
MCHC: 33 g/dL (ref 30.0–36.0)
MCV: 97.2 fL (ref 80.0–100.0)
Monocytes Absolute: 0.5 10*3/uL (ref 0.1–1.0)
Monocytes Relative: 10 %
Neutro Abs: 1.4 10*3/uL — ABNORMAL LOW (ref 1.7–7.7)
Neutrophils Relative %: 27 %
Platelets: 209 10*3/uL (ref 150–400)
RBC: 3.99 MIL/uL — ABNORMAL LOW (ref 4.22–5.81)
RDW: 13.4 % (ref 11.5–15.5)
WBC: 5 10*3/uL (ref 4.0–10.5)
nRBC: 0 % (ref 0.0–0.2)

## 2022-10-27 LAB — COMPREHENSIVE METABOLIC PANEL
ALT: 24 U/L (ref 0–44)
AST: 25 U/L (ref 15–41)
Albumin: 3.7 g/dL (ref 3.5–5.0)
Alkaline Phosphatase: 63 U/L (ref 38–126)
Anion gap: 9 (ref 5–15)
BUN: 9 mg/dL (ref 8–23)
CO2: 24 mmol/L (ref 22–32)
Calcium: 8.6 mg/dL — ABNORMAL LOW (ref 8.9–10.3)
Chloride: 102 mmol/L (ref 98–111)
Creatinine, Ser: 1.08 mg/dL (ref 0.61–1.24)
GFR, Estimated: >60 mL/min (ref >60)
Glucose, Bld: 137 mg/dL — ABNORMAL HIGH (ref 70–99)
Potassium: 3.3 mmol/L — ABNORMAL LOW (ref 3.5–5.1)
Sodium: 135 mmol/L (ref 135–145)
Total Bilirubin: 0.8 mg/dL (ref 0.3–1.2)
Total Protein: 6.4 g/dL — ABNORMAL LOW (ref 6.5–8.1)

## 2022-10-27 LAB — MAGNESIUM: Magnesium: 1.7 mg/dL (ref 1.7–2.4)

## 2022-10-27 MED ORDER — DARATUMUMAB-HYALURONIDASE-FIHJ 1800-30000 MG-UT/15ML ~~LOC~~ SOLN
1800.0000 mg | Freq: Once | SUBCUTANEOUS | Status: AC
  Administered 2022-10-27: 1800 mg via SUBCUTANEOUS
  Filled 2022-10-27: qty 15

## 2022-10-27 MED ORDER — POTASSIUM CHLORIDE CRYS ER 20 MEQ PO TBCR
40.0000 meq | EXTENDED_RELEASE_TABLET | Freq: Once | ORAL | Status: AC
  Administered 2022-10-27: 40 meq via ORAL
  Filled 2022-10-27: qty 2

## 2022-10-27 MED ORDER — CETIRIZINE HCL 10 MG PO TABS
10.0000 mg | ORAL_TABLET | Freq: Once | ORAL | Status: AC
  Administered 2022-10-27: 10 mg via ORAL
  Filled 2022-10-27: qty 1

## 2022-10-27 NOTE — Patient Instructions (Signed)
[facility]  Discharge Instructions: Thank you for choosing [facility] to provide your oncology and hematology care.  If you have a lab appointment with the Cancer Center - please note that after April 8th, 2024, all labs will be drawn in the cancer center.  You do not have to check in or register with the main entrance as you have in the past but will complete your check-in in the cancer center.  Wear comfortable clothing and clothing appropriate for easy access to any Portacath or PICC line.   We strive to give you quality time with your provider. You may need to reschedule your appointment if you arrive late (15 or more minutes).  Arriving late affects you and other patients whose appointments are after yours.  Also, if you miss three or more appointments without notifying the office, you may be dismissed from the clinic at the provider's discretion.      For prescription refill requests, have your pharmacy contact our office and allow 72 hours for refills to be completed.    Today you received the following chemotherapy and/or immunotherapy agents Daratumumab      To help prevent nausea and vomiting after your treatment, we encourage you to take your nausea medication as directed.  BELOW ARE SYMPTOMS THAT SHOULD BE REPORTED IMMEDIATELY: *FEVER GREATER THAN 100.4 F (38 C) OR HIGHER *CHILLS OR SWEATING *NAUSEA AND VOMITING THAT IS NOT CONTROLLED WITH YOUR NAUSEA MEDICATION *UNUSUAL SHORTNESS OF BREATH *UNUSUAL BRUISING OR BLEEDING *URINARY PROBLEMS (pain or burning when urinating, or frequent urination) *BOWEL PROBLEMS (unusual diarrhea, constipation, pain near the anus) TENDERNESS IN MOUTH AND THROAT WITH OR WITHOUT PRESENCE OF ULCERS (sore throat, sores in mouth, or a toothache) UNUSUAL RASH, SWELLING OR PAIN  UNUSUAL VAGINAL DISCHARGE OR ITCHING   Items with * indicate a potential emergency and should be followed up as soon as possible or go to the  Emergency Department if any problems should occur.  Please show the CHEMOTHERAPY ALERT CARD or IMMUNOTHERAPY ALERT CARD at check-in to the Emergency Department and triage nurse.  Should you have questions after your visit or need to cancel or reschedule your appointment, please contact [facility] CENTER AT [facility] [phone]  and follow the prompts.  Office hours are 8:00 a.m. to 4:30 p.m. Monday - Friday. Please note that voicemails left after 4:00 p.m. may not be returned until the following business day.  We are closed weekends and major holidays. You have access to a nurse at all times for urgent questions. Please call the main number to the clinic [phone] and follow the prompts.  For any non-urgent questions, you may also contact your provider using MyChart. We now offer e-Visits for anyone 2 and older to request care online for non-urgent symptoms. For details visit [email or web address].   Also download the MyChart app! Go to the app store, search "MyChart", open the app, select [facility], and log in with your MyChart username and password.

## 2022-10-27 NOTE — Progress Notes (Signed)
Patient presents today for Daratumumab injection per providers order.  Vital signs and labs within parameters for treatment.  Patient has no new complaints at this time.  Stable during administration without incident; injection site WNL; see MAR for injection details.  Patient tolerated procedure well and without incident.  No questions or complaints noted at this time.

## 2022-11-03 ENCOUNTER — Other Ambulatory Visit: Payer: Self-pay | Admitting: Hematology

## 2022-11-03 NOTE — Telephone Encounter (Signed)
Chart reviewed. Revlimid refilled per last office note with Dr. Katragadda.  

## 2022-11-10 ENCOUNTER — Other Ambulatory Visit: Payer: Self-pay

## 2022-11-16 ENCOUNTER — Other Ambulatory Visit: Payer: Self-pay

## 2022-11-16 DIAGNOSIS — C9000 Multiple myeloma not having achieved remission: Secondary | ICD-10-CM

## 2022-11-16 NOTE — Progress Notes (Signed)
Lab orders entered

## 2022-11-17 ENCOUNTER — Inpatient Hospital Stay: Payer: Medicare Other | Attending: Hematology

## 2022-11-17 DIAGNOSIS — C9000 Multiple myeloma not having achieved remission: Secondary | ICD-10-CM | POA: Diagnosis present

## 2022-11-17 DIAGNOSIS — Z5112 Encounter for antineoplastic immunotherapy: Secondary | ICD-10-CM | POA: Insufficient documentation

## 2022-11-17 LAB — CBC WITH DIFFERENTIAL/PLATELET
Abs Immature Granulocytes: 0.01 10*3/uL (ref 0.00–0.07)
Basophils Absolute: 0 10*3/uL (ref 0.0–0.1)
Basophils Relative: 1 %
Eosinophils Absolute: 0.1 10*3/uL (ref 0.0–0.5)
Eosinophils Relative: 2 %
HCT: 40.5 % (ref 39.0–52.0)
Hemoglobin: 13.3 g/dL (ref 13.0–17.0)
Immature Granulocytes: 0 %
Lymphocytes Relative: 53 %
Lymphs Abs: 2.4 10*3/uL (ref 0.7–4.0)
MCH: 32.4 pg (ref 26.0–34.0)
MCHC: 32.8 g/dL (ref 30.0–36.0)
MCV: 98.8 fL (ref 80.0–100.0)
Monocytes Absolute: 0.7 10*3/uL (ref 0.1–1.0)
Monocytes Relative: 16 %
Neutro Abs: 1.3 10*3/uL — ABNORMAL LOW (ref 1.7–7.7)
Neutrophils Relative %: 28 %
Platelets: 217 10*3/uL (ref 150–400)
RBC: 4.1 MIL/uL — ABNORMAL LOW (ref 4.22–5.81)
RDW: 13.9 % (ref 11.5–15.5)
WBC: 4.5 10*3/uL (ref 4.0–10.5)
nRBC: 0 % (ref 0.0–0.2)

## 2022-11-17 LAB — COMPREHENSIVE METABOLIC PANEL
ALT: 35 U/L (ref 0–44)
AST: 38 U/L (ref 15–41)
Albumin: 3.9 g/dL (ref 3.5–5.0)
Alkaline Phosphatase: 52 U/L (ref 38–126)
Anion gap: 7 (ref 5–15)
BUN: 17 mg/dL (ref 8–23)
CO2: 26 mmol/L (ref 22–32)
Calcium: 8.8 mg/dL — ABNORMAL LOW (ref 8.9–10.3)
Chloride: 104 mmol/L (ref 98–111)
Creatinine, Ser: 1.03 mg/dL (ref 0.61–1.24)
GFR, Estimated: >60 mL/min (ref >60)
Glucose, Bld: 92 mg/dL (ref 70–99)
Potassium: 3.6 mmol/L (ref 3.5–5.1)
Sodium: 137 mmol/L (ref 135–145)
Total Bilirubin: 0.8 mg/dL (ref <1.2)
Total Protein: 6.5 g/dL (ref 6.5–8.1)

## 2022-11-17 LAB — MAGNESIUM: Magnesium: 1.8 mg/dL (ref 1.7–2.4)

## 2022-11-18 LAB — KAPPA/LAMBDA LIGHT CHAINS
Kappa free light chain: 17.8 mg/L (ref 3.3–19.4)
Kappa, lambda light chain ratio: 1.78 — ABNORMAL HIGH (ref 0.26–1.65)
Lambda free light chains: 10 mg/L (ref 5.7–26.3)

## 2022-11-19 LAB — PROTEIN ELECTROPHORESIS, SERUM
A/G Ratio: 1.5 (ref 0.7–1.7)
Albumin ELP: 3.8 g/dL (ref 2.9–4.4)
Alpha-1-Globulin: 0.2 g/dL (ref 0.0–0.4)
Alpha-2-Globulin: 0.6 g/dL (ref 0.4–1.0)
Beta Globulin: 0.9 g/dL (ref 0.7–1.3)
Gamma Globulin: 0.7 g/dL (ref 0.4–1.8)
Globulin, Total: 2.5 g/dL (ref 2.2–3.9)
Total Protein ELP: 6.3 g/dL (ref 6.0–8.5)

## 2022-11-23 ENCOUNTER — Other Ambulatory Visit: Payer: Self-pay

## 2022-11-24 ENCOUNTER — Inpatient Hospital Stay (HOSPITAL_BASED_OUTPATIENT_CLINIC_OR_DEPARTMENT_OTHER): Payer: Medicare Other | Admitting: Hematology

## 2022-11-24 ENCOUNTER — Inpatient Hospital Stay: Payer: Medicare Other

## 2022-11-24 VITALS — BP 141/76 | HR 64 | Resp 16 | Wt 236.0 lb

## 2022-11-24 DIAGNOSIS — C9000 Multiple myeloma not having achieved remission: Secondary | ICD-10-CM

## 2022-11-24 DIAGNOSIS — Z5112 Encounter for antineoplastic immunotherapy: Secondary | ICD-10-CM | POA: Diagnosis not present

## 2022-11-24 MED ORDER — DARATUMUMAB-HYALURONIDASE-FIHJ 1800-30000 MG-UT/15ML ~~LOC~~ SOLN
1800.0000 mg | Freq: Once | SUBCUTANEOUS | Status: AC
  Administered 2022-11-24: 1800 mg via SUBCUTANEOUS
  Filled 2022-11-24: qty 15

## 2022-11-24 MED ORDER — CETIRIZINE HCL 10 MG PO TABS
10.0000 mg | ORAL_TABLET | Freq: Once | ORAL | Status: AC
  Administered 2022-11-24: 10 mg via ORAL
  Filled 2022-11-24: qty 1

## 2022-11-24 MED ORDER — ZOLEDRONIC ACID 4 MG/100ML IV SOLN
4.0000 mg | Freq: Once | INTRAVENOUS | Status: AC
  Administered 2022-11-24: 4 mg via INTRAVENOUS
  Filled 2022-11-24: qty 100

## 2022-11-24 MED ORDER — SODIUM CHLORIDE 0.9 % IV SOLN: Freq: Once | INTRAVENOUS | Status: AC

## 2022-11-24 NOTE — Progress Notes (Signed)
Patient tolerated Darzalex injection  SQ and Zometa infusion well with no complaints voiced.  Site clean and dry with no bruising or swelling noted at site.  See MAR for details.  Band aid applied.  Patient stable during and after injection and infusion.  Vss with discharge and left in satisfactory condition with no s/s of distress noted. All follow ups as scheduled.   Payden [employer]

## 2022-11-24 NOTE — Patient Instructions (Signed)

## 2022-11-24 NOTE — Progress Notes (Signed)
[facility] [address], [place] [address]    Clinic Day:  11/24/2022  Referring physician: Anabel Halon, MD  Patient Care Team: Anabel Halon, MD as PCP - General (Internal Medicine) Wyline Mood Dorothe Pea, MD as PCP - Cardiology (Cardiology) Lanelle Bal, DO as Consulting Physician (Internal Medicine) Therese Sarah, RN as Oncology Nurse Navigator (Oncology) Doreatha Massed, MD as Medical Oncologist (Oncology)   ASSESSMENT & PLAN:   Assessment: 1.  Standard risk plasma cell myeloma: - He reported low back pain radiating to the right thigh since April, pain gets worse when he walks for more than 2 blocks. - He had history of MGUS and was last seen in our clinic in 2019.  He had an abnormal M spike of 3.2 g on 12/06/2018. - Skeletal survey on 10/22/2017 showed no suspicious lytic lesions. - X-ray of the right sided pelvis on 06/11/2020 showed prominent lytic lesions noted in the proximal midportion of the right femoral diaphysis with no evidence of fracture or dislocation. - Bone marrow biopsy on 07/09/2020 with hypercellular marrow with trilineage hematopoiesis.  Plasma cells representing 10% of cells. - Chromosome analysis 46, XY (20).  Multiple myeloma FISH panel negative. - PET scan on 07/04/2020 with multiple hypermetabolic bony soft tissue lesions.  Dominant lesions in the L5 vertebral body and posterior right acetabulum and distal right femur.  Focal increased uptake in the right tonsillar region with associated soft tissue fullness on CT imaging. - 24-hour urine with total protein 134. - Beta-2 microglobulin 2.1 (06/26/2020), LDH 136. - Dara RVD cycle 1 started on 09/03/2020. - He had stem cell collection at [facility].  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 - Maintenance daratumumab monthly and Revlimid 10 mg 3 weeks on/1 week off started on 04/08/2021. - We reviewed PET scan (07/03/2021): Lytic soft tissue lesion in the right L5  vertebral body measures 3.7 x 2.8 cm SUV 2.9, previously 3.4 x 3.4 cm, SUV 6.6.  Soft tissue lesion in the posterior right acetabulum measures 2.2 cm SUV 2.5, previously 2.2 cm with SUV 9.  Hypermetabolism in the left tonsil SUV 5.9.  Direct oropharyngeal exam did not show any mass.  New mildly hypermetabolic left lower pulmonary nodules nonspecific.  2.  Social/family history: - He works at [employer] in environmental services. - He was never smoker. - 2 brothers died of metastatic lung cancer.  Sister has thyroid cancer.  Mother had breast cancer.    Plan: 1.  Stage I standard risk IgG kappa multiple myeloma: - He is tolerating Revlimid and monthly Darzalex reasonably well.  He had COVID infection on 10/05/2022 and recovered from it. - Reviewed myeloma labs from 11/17/2022: M spike not detected.  FLC ratio is 1.78 and stable.  Immunofixation is pending.  CBC was grossly normal with normal LFTs and creatinine. - Continue Revlimid 10 mg 3 weeks on/1 week off.  Continue Darzalex monthly and dexamethasone 40 mg on the days of Darzalex. - RTC 3 months with repeat myeloma labs.  2.  Left knee pain: - Left knee pain is stable.  Continue to follow-up with Dr. Hilda Lias.  3.  Infection prophylaxis: - Continue acyclovir for shingles prophylaxis.  Continue aspirin for thromboprophylaxis.  4.  Myeloma bone disease: - Calcium is 8.8 today.  He will proceed with Zometa today and every 12 weeks.    No orders of the defined types were placed in this encounter.     Mikeal Hawthorne  R Teague,acting as a Neurosurgeon for Doreatha Massed, MD.,have documented all relevant documentation on the behalf of Doreatha Massed, MD,as directed by  Doreatha Massed, MD while in the presence of Doreatha Massed, MD.  I, Doreatha Massed MD, have reviewed the above documentation for accuracy and completeness, and I agree with the above.     Doreatha Massed, MD   11/12/20245:22 PM  CHIEF  COMPLAINT:   Diagnosis: multiple myeloma    Cancer Staging  Multiple myeloma without remission (HCC) Staging form: Plasma Cell Myeloma and Plasma Cell Disorders, AJCC 8th Edition - Clinical stage from 07/24/2020: RISS Stage I (Beta-2-microglobulin (mg/L): 2.1, Albumin (g/dL): 3.7, ISS: Stage I, High-risk cytogenetics: Absent, LDH: Normal) - Signed by Artis Delay, MD on 07/24/2020    Prior Therapy: DaraVRd 09/03/20 - 02/26/21  Current Therapy:  maintenance Revlimid/daratumumab   HISTORY OF PRESENT ILLNESS:   Oncology History  Multiple myeloma without remission (HCC)  07/09/2020 Pathology Results   BONE MARROW, ASPIRATE, CLOT, CORE:  -Hypercellular bone marrow with plasma cell neoplasm  -See comment   PERIPHERAL BLOOD:  -Slight macrocytic anemia   COMMENT:   The bone marrow is hypercellular for age with trilineage hematopoiesis and nonspecific myeloid changes.  In this background, the plasma cells are increased in number representing 10% of all cells associated with atypical cytomorphologic features.  The plasma cells display kappa light chain restriction consistent with plasma cell neoplasm.  Correlation with cytogenetic and FISH studies is recommended.    07/24/2020 Initial Diagnosis   Multiple myeloma without remission (HCC)   07/24/2020 Cancer Staging   Staging form: Plasma Cell Myeloma and Plasma Cell Disorders, AJCC 8th Edition - Clinical stage from 07/24/2020: RISS Stage I (Beta-2-microglobulin (mg/L): 2.1, Albumin (g/dL): 3.7, ISS: Stage I, High-risk cytogenetics: Absent, LDH: Normal) - Signed by Artis Delay, MD on 07/24/2020 Stage prefix: Initial diagnosis Beta 2 microglobulin range (mg/L): Less than 3.5 Albumin range (g/dL): Greater than or equal to 3.5 Cytogenetics: No abnormalities   09/03/2020 - 09/02/2021 Chemotherapy   Patient is on Treatment Plan : MYELOMA NEWLY DIAGNOSED TRANSPLANT CANDIDATE DaraVRd (Daratumumab SQ) q21d x 6 Cycles (Induction/Consolidation)      04/16/2021 -  Chemotherapy   Patient is on Treatment Plan : MYELOMA Daratumumab SQ q28d        INTERVAL HISTORY:   [name] is a 67 y.o. male presenting to clinic today for follow up of multiple myeloma. He was last seen by me on 09/01/22.  Since his last visit, he presented to the ED on 10/05/22 for COVID-19 infection and was prescribed Paxlovid BID. He is at baseline at this visit.   Today, he states that he is doing well overall. His appetite level is at 100%. His energy level is at 75%. He denies any side effects from Revlimid and is taking it as prescribed. He reports stable left knee pain that is occasional. Pain is worsened when standing or walking for long periods of time. He treats pain with a knee brace and does not have a follow-up with Dr. Hilda Lias unless pain worsens. He recently started working part-time.   PAST MEDICAL HISTORY:   Past Medical History: Past Medical History:  Diagnosis Date   Back pain    CAD (coronary artery disease)    a. 01/2017 NSTEMI/Cath: LM nl, LAD 25p - ? hypodense but no filling defect, RI nl, LCX nl, RCA nl, EF 55-65%-->Med Rx.   Erectile dysfunction    History of echocardiogram    a. 01/2017 Echo: EF 55-60%, no  rwma.   Hypertension     Surgical History: Past Surgical History:  Procedure Laterality Date   LEFT HEART CATH AND CORONARY ANGIOGRAPHY N/A 01/15/2017   Procedure: LEFT HEART CATH AND CORONARY ANGIOGRAPHY;  Surgeon: [place], Peter M, MD;  Location: [facility] INVASIVE CV LAB;  Service: Cardiovascular;  Laterality: N/A;   ORIF trocanteric femoral IM Nail Right     Social History: Social History   Socioeconomic History   Marital status: Significant Other    Spouse name: Not on file   Number of children: Not on file   Years of education: Not on file   Highest education level: Not on file  Occupational History   Not on file  Tobacco Use   Smoking status: Never   Smokeless tobacco: Never  Vaping Use   Vaping status: Never Used  Substance  and Sexual Activity   Alcohol use: No   Drug use: No   Sexual activity: Yes  Other Topics Concern   Not on file  Social History Narrative   Not on file   Social Determinants of Health   Financial Resource Strain: Low Risk  (06/26/2020)   Overall Financial Resource Strain (CARDIA)    Difficulty of Paying Living Expenses: Not hard at all  Food Insecurity: No Food Insecurity (06/26/2020)   Hunger Vital Sign    Worried About Running Out of Food in the Last Year: Never true    Ran Out of Food in the Last Year: Never true  Transportation Needs: No Transportation Needs (06/26/2020)   PRAPARE - Administrator, Civil Service (Medical): No    Lack of Transportation (Non-Medical): No  Physical Activity: Insufficiently Active (06/26/2020)   Exercise Vital Sign    Days of Exercise per Week: 2 days    Minutes of Exercise per Session: 10 min  Stress: No Stress Concern Present (06/26/2020)   [employer] of Occupational Health - Occupational Stress Questionnaire    Feeling of Stress : Not at all  Social Connections: Moderately Integrated (06/26/2020)   Social Connection and Isolation Panel [NHANES]    Frequency of Communication with Friends and Family: More than three times a week    Frequency of Social Gatherings with Friends and Family: More than three times a week    Attends Religious Services: 1 to 4 times per year    Active Member of [employer] or Organizations: No    Attends Banker Meetings: 1 to 4 times per year    Marital Status: Divorced  Catering manager Violence: Not At Risk (06/26/2020)   Humiliation, Afraid, Rape, and Kick questionnaire    Fear of Current or Ex-Partner: No    Emotionally Abused: No    Physically Abused: No    Sexually Abused: No    Family History: Family History  Problem Relation Age of Onset   Cancer Mother        breast   Diabetes Mother    Diabetes Sister    Hypertension Sister    Cancer Brother    Mental illness Sister      Current Medications:  Current Outpatient Medications:    acyclovir (ZOVIRAX) 400 MG tablet, TAKE 1 TABLET BY MOUTH TWICE DAILY., Disp: 60 tablet, Rfl: 5   aspirin EC 81 MG tablet, Take by mouth., Disp: , Rfl:    calcium-vitamin D (OSCAL WITH D) 500-200 MG-UNIT tablet, Take 1 tablet by mouth., Disp: , Rfl:    dexamethasone (DECADRON) 4 MG tablet, Take 10 tablets (40 mg  total) by mouth once a week., Disp: 40 tablet, Rfl: 6   lenalidomide (REVLIMID) 10 MG capsule, TAKE 1 CAPSULE BY MOUTH 1 TIME A DAY FOR 21 DAYS ON THEN 7 DAYS OFF, Disp: 21 capsule, Rfl: 0   metoprolol succinate (TOPROL-XL) 25 MG 24 hr tablet, TAKE 1 TABLET BY MOUTH DAILY, Disp: 90 tablet, Rfl: 3   Multiple Vitamin (MULTIVITAMIN) tablet, Take 1 tablet by mouth daily.  , Disp: , Rfl:    Multiple Vitamins/Iron TABS, Take by mouth., Disp: , Rfl:    nitroGLYCERIN (NITROSTAT) 0.4 MG SL tablet, Place 1 tablet (0.4 mg total) under the tongue every 5 (five) minutes for 3 doses as needed for chest pain., Disp: 25 tablet, Rfl: 3   ondansetron (ZOFRAN) 8 MG tablet, , Disp: , Rfl:    prochlorperazine (COMPAZINE) 10 MG tablet, , Disp: , Rfl:    rosuvastatin (CRESTOR) 40 MG tablet, Take 1 tablet (40 mg total) by mouth every other day., Disp: 90 tablet, Rfl: 1   traMADol (ULTRAM) 50 MG tablet, Take 1 tablet (50 mg total) by mouth every 6 (six) hours as needed., Disp: 60 tablet, Rfl: 0   Allergies: No Known Allergies  REVIEW OF SYSTEMS:   Review of Systems  Constitutional:  Negative for chills, fatigue and fever.  HENT:   Negative for lump/mass, mouth sores, nosebleeds, sore throat and trouble swallowing.   Eyes:  Negative for eye problems.  Respiratory:  Negative for cough and shortness of breath.   Cardiovascular:  Negative for chest pain, leg swelling and palpitations.  Gastrointestinal:  Negative for abdominal pain, constipation, diarrhea, nausea and vomiting.  Genitourinary:  Negative for bladder incontinence, difficulty  urinating, dysuria, frequency, hematuria and nocturia.   Musculoskeletal:  Negative for arthralgias, back pain, flank pain, myalgias and neck pain.       +left knee pain, 3/10 severity  Skin:  Negative for itching and rash.  Neurological:  Negative for dizziness, headaches and numbness.  Hematological:  Does not bruise/bleed easily.  Psychiatric/Behavioral:  Negative for depression, sleep disturbance and suicidal ideas. The patient is not nervous/anxious.   All other systems reviewed and are negative.    VITALS:   Blood pressure (!) 141/76, pulse 64, resp. rate 16, weight 236 lb (107 kg), SpO2 97%.  Wt Readings from Last 3 Encounters:  11/24/22 236 lb (107 kg)  10/05/22 77 lb 2.6 oz (35 kg)  09/29/22 237 lb (107.5 kg)    Body mass index is 34.85 kg/m.  Performance status (ECOG): 1 - Symptomatic but completely ambulatory  PHYSICAL EXAM:   Physical Exam Vitals and nursing note reviewed. Exam conducted with a chaperone present.  Constitutional:      Appearance: Normal appearance.  Cardiovascular:     Rate and Rhythm: Normal rate and regular rhythm.     Pulses: Normal pulses.     Heart sounds: Normal heart sounds.  Pulmonary:     Effort: Pulmonary effort is normal.     Breath sounds: Normal breath sounds.  Abdominal:     Palpations: Abdomen is soft. There is no hepatomegaly, splenomegaly or mass.     Tenderness: There is no abdominal tenderness.  Musculoskeletal:     Right lower leg: No edema.     Left lower leg: No edema.  Lymphadenopathy:     Cervical: No cervical adenopathy.     Right cervical: No superficial, deep or posterior cervical adenopathy.    Left cervical: No superficial, deep or posterior cervical adenopathy.  Upper Body:     Right upper body: No supraclavicular or axillary adenopathy.     Left upper body: No supraclavicular or axillary adenopathy.  Neurological:     General: No focal deficit present.     Mental Status: He is alert and oriented to  person, place, and time.  Psychiatric:        Mood and Affect: Mood normal.        Behavior: Behavior normal.     LABS:      Latest Ref Rng & Units 11/17/2022    1:31 PM 10/27/2022   12:43 PM 10/05/2022    9:01 AM  CBC  WBC 4.0 - 10.5 K/uL 4.5  5.0  5.8   Hemoglobin 13.0 - 17.0 g/dL 56.2  13.0  86.5   Hematocrit 39.0 - 52.0 % 40.5  38.8  41.4   Platelets 150 - 400 K/uL 217  209  166       Latest Ref Rng & Units 11/17/2022    1:31 PM 10/27/2022   12:43 PM 10/05/2022    9:01 AM  CMP  Glucose 70 - 99 mg/dL 92  784  696   BUN 8 - 23 mg/dL 17  9  12    Creatinine 0.61 - 1.24 mg/dL 2.95  2.84  1.32   Sodium 135 - 145 mmol/L 137  135  136   Potassium 3.5 - 5.1 mmol/L 3.6  3.3  3.8   Chloride 98 - 111 mmol/L 104  102  100   CO2 22 - 32 mmol/L 26  24  25    Calcium 8.9 - 10.3 mg/dL 8.8  8.6  8.6   Total Protein 6.5 - 8.1 g/dL 6.5  6.4    Total Bilirubin <1.2 mg/dL 0.8  0.8    Alkaline Phos 38 - 126 U/L 52  63    AST 15 - 41 U/L 38  25    ALT 0 - 44 U/L 35  24       No results found for: "CEA1", "CEA" / No results found for: "CEA1", "CEA" Lab Results  Component Value Date   PSA1 0.5 07/16/2020   No results found for: "GMW102" No results found for: "CAN125"  Lab Results  Component Value Date   TOTALPROTELP 6.3 11/17/2022   ALBUMINELP 3.8 11/17/2022   A1GS 0.2 11/17/2022   A2GS 0.6 11/17/2022   BETS 0.9 11/17/2022   BETA2SER 0.3 12/07/2018   GAMS 0.7 11/17/2022   MSPIKE Not Observed 11/17/2022   SPEI Comment 11/17/2022   Lab Results  Component Value Date   FERRITIN 210 10/05/2017   Lab Results  Component Value Date   LDH 108 07/07/2022   LDH 116 06/09/2022   LDH 106 03/10/2022     STUDIES:   No results found.

## 2022-11-24 NOTE — Progress Notes (Signed)
Patient has been examined by Dr. Katragadda. Vital signs and labs have been reviewed by MD - ANC, Creatinine, LFTs, hemoglobin, and platelets are within treatment parameters per M.D. - pt may proceed with treatment.  Primary RN and pharmacy notified.  

## 2022-11-24 NOTE — Patient Instructions (Signed)
[name] - A DEPT OF [name]  Discharge Instructions: Thank you for choosing [name] to provide your oncology and hematology care.  If you have a lab appointment with the [name] - please note that after April 8th, 2024, all labs will be drawn in the [name].  You do not have to check in or register with the main entrance as you have in the past but will complete your check-in in the [name].  Wear comfortable clothing and clothing appropriate for easy access to any Portacath or PICC line.   We strive to give you quality time with your provider. You may need to reschedule your appointment if you arrive late (15 or more minutes).  Arriving late affects you and other patients whose appointments are after yours.  Also, if you miss three or more appointments without notifying the office, you may be dismissed from the clinic at the provider's discretion.      For prescription refill requests, have your pharmacy contact our office and allow 72 hours for refills to be completed.    Today you received the following chemotherapy and/or immunotherapy agents darzalex   To help prevent nausea and vomiting after your treatment, we encourage you to take your nausea medication as directed.  BELOW ARE SYMPTOMS THAT SHOULD BE REPORTED IMMEDIATELY: *FEVER GREATER THAN 100.4 F (38 C) OR HIGHER *CHILLS OR SWEATING *NAUSEA AND VOMITING THAT IS NOT CONTROLLED WITH YOUR NAUSEA MEDICATION *UNUSUAL SHORTNESS OF BREATH *UNUSUAL BRUISING OR BLEEDING *URINARY PROBLEMS (pain or burning when urinating, or frequent urination) *BOWEL PROBLEMS (unusual diarrhea, constipation, pain near the anus) TENDERNESS IN MOUTH AND THROAT WITH OR WITHOUT PRESENCE OF ULCERS (sore throat, sores in mouth, or a toothache) UNUSUAL RASH, SWELLING OR PAIN  UNUSUAL VAGINAL DISCHARGE OR ITCHING   Items with * indicate a potential emergency and should be followed up as  soon as possible or go to the Emergency Department if any problems should occur.  Please show the CHEMOTHERAPY ALERT CARD or IMMUNOTHERAPY ALERT CARD at check-in to the Emergency Department and triage nurse.  Should you have questions after your visit or need to cancel or reschedule your appointment, please contact [name] - A DEPT OF [name] [phone]  and follow the prompts.  Office hours are 8:00 a.m. to 4:30 p.m. Monday - Friday. Please note that voicemails left after 4:00 p.m. may not be returned until the following business day.  We are closed weekends and major holidays. You have access to a nurse at all times for urgent questions. Please call the main number to the clinic [phone] and follow the prompts.  For any non-urgent questions, you may also contact your provider using MyChart. We now offer e-Visits for anyone 16 and older to request care online for non-urgent symptoms. For details visit [email or web address].   Also download the MyChart app! Go to the app store, search "MyChart", open the app, select [place], and log in with your MyChart username and password.

## 2022-11-25 LAB — IMMUNOFIXATION ELECTROPHORESIS
IgA: 86 mg/dL (ref 61–437)
IgG (Immunoglobin G), Serum: 819 mg/dL (ref 603–1613)
IgM (Immunoglobulin M), Srm: 31 mg/dL (ref 20–172)
Total Protein ELP: 6.2 g/dL (ref 6.0–8.5)

## 2022-11-26 ENCOUNTER — Other Ambulatory Visit: Payer: Self-pay

## 2022-11-29 ENCOUNTER — Other Ambulatory Visit: Payer: Self-pay

## 2022-12-02 ENCOUNTER — Other Ambulatory Visit: Payer: Self-pay | Admitting: Hematology

## 2022-12-02 NOTE — Telephone Encounter (Signed)
Chart reviewed. Revlimid refilled per last office note with Dr. Katragadda.  

## 2022-12-18 ENCOUNTER — Ambulatory Visit (INDEPENDENT_AMBULATORY_CARE_PROVIDER_SITE_OTHER): Payer: Medicare Other | Admitting: Internal Medicine

## 2022-12-18 ENCOUNTER — Encounter: Payer: Self-pay | Admitting: Internal Medicine

## 2022-12-18 VITALS — BP 138/70 | HR 84 | Ht 69.0 in | Wt 230.6 lb

## 2022-12-18 DIAGNOSIS — E782 Mixed hyperlipidemia: Secondary | ICD-10-CM

## 2022-12-18 DIAGNOSIS — C9000 Multiple myeloma not having achieved remission: Secondary | ICD-10-CM

## 2022-12-18 DIAGNOSIS — I2510 Atherosclerotic heart disease of native coronary artery without angina pectoris: Secondary | ICD-10-CM | POA: Diagnosis not present

## 2022-12-18 DIAGNOSIS — I10 Essential (primary) hypertension: Secondary | ICD-10-CM | POA: Diagnosis not present

## 2022-12-18 MED ORDER — ROSUVASTATIN CALCIUM 40 MG PO TABS: 40.0000 mg | ORAL_TABLET | ORAL | 1 refills | Status: DC

## 2022-12-18 NOTE — Assessment & Plan Note (Addendum)
BP Readings from Last 1 Encounters:  12/18/22 138/70   overall well-controlled with Metoprolol Counseled for compliance with the medications Advised DASH diet and moderate exercise/walking as tolerated

## 2022-12-18 NOTE — Assessment & Plan Note (Signed)
Has had cardiac cath in the past, no stents On Aspirin, statin and B-blocker Counseled again to stay compliant to statin therapy F/u with Cardiology 

## 2022-12-18 NOTE — Patient Instructions (Addendum)
Please continue to take medications as prescribed.  Please continue to follow low salt diet and perform moderate exercise/walking at least 150 mins/week. 

## 2022-12-18 NOTE — Assessment & Plan Note (Signed)
Undergoing chemotherapy for MM ?S/p stem cell collection at [facility] ?Followed by Oncology ?

## 2022-12-18 NOTE — Assessment & Plan Note (Addendum)
Needs to take Crestor, at least QOD Reviewed last lipid profile

## 2022-12-18 NOTE — Progress Notes (Signed)
Established Patient Office Visit  Subjective:  Patient ID: Eugene [name], male    DOB: October 29, 1955  Age: 67 y.o. MRN: [id number]  CC:  Chief Complaint  Patient presents with   Annual Exam    HPI Marques Luthman is a 67 y.o. male with past medical history of CAD, HTN, multiple myeloma and obesity who presents for f/u of his chronic medical conditions.  HTN: BP is well-controlled. Takes medications regularly. Patient denies headache, dizziness, chest pain, dyspnea or palpitations.  CAD: Takes Aspirin and Metoprolol. Does not take Crestor regularly, advised to take it at least QOD.   MM: Followed by Oncology, undergoing chemotherapy. Has had stem cell collection at [facility].  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  Past Medical History:  Diagnosis Date   Back pain    CAD (coronary artery disease)    a. 01/2017 NSTEMI/Cath: LM nl, LAD 25p - ? hypodense but no filling defect, RI nl, LCX nl, RCA nl, EF 55-65%-->Med Rx.   Erectile dysfunction    History of echocardiogram    a. 01/2017 Echo: EF 55-60%, no rwma.   Hypertension     Past Surgical History:  Procedure Laterality Date   LEFT HEART CATH AND CORONARY ANGIOGRAPHY N/A 01/15/2017   Procedure: LEFT HEART CATH AND CORONARY ANGIOGRAPHY;  Surgeon: Swaziland, Peter M, MD;  Location: [facility] INVASIVE CV LAB;  Service: Cardiovascular;  Laterality: N/A;   ORIF trocanteric femoral IM Nail Right     Family History  Problem Relation Age of Onset   Cancer Mother        breast   Diabetes Mother    Diabetes Sister    Hypertension Sister    Cancer Brother    Mental illness Sister     Social History   Socioeconomic History   Marital status: Significant Other    Spouse name: Not on file   Number of children: Not on file   Years of education: Not on file   Highest education level: Not on file  Occupational History   Not on file  Tobacco Use   Smoking status: Never   Smokeless tobacco: Never   Vaping Use   Vaping status: Never Used  Substance and Sexual Activity   Alcohol use: No   Drug use: No   Sexual activity: Yes  Other Topics Concern   Not on file  Social History Narrative   Not on file   Social Determinants of Health   Financial Resource Strain: Low Risk  (06/26/2020)   Overall Financial Resource Strain (CARDIA)    Difficulty of Paying Living Expenses: Not hard at all  Food Insecurity: No Food Insecurity (06/26/2020)   Hunger Vital Sign    Worried About Running Out of Food in the Last Year: Never true    Ran Out of Food in the Last Year: Never true  Transportation Needs: No Transportation Needs (06/26/2020)   PRAPARE - Administrator, Civil Service (Medical): No    Lack of Transportation (Non-Medical): No  Physical Activity: Insufficiently Active (06/26/2020)   Exercise Vital Sign    Days of Exercise per Week: 2 days    Minutes of Exercise per Session: 10 min  Stress: No Stress Concern Present (06/26/2020)   [employer] of Occupational Health - Occupational Stress Questionnaire    Feeling of Stress : Not at all  Social Connections: Moderately Integrated (06/26/2020)   Social Connection and Isolation Panel [NHANES]  Frequency of Communication with Friends and Family: More than three times a week    Frequency of Social Gatherings with Friends and Family: More than three times a week    Attends Religious Services: 1 to 4 times per year    Active Member of [employer] or Organizations: No    Attends Banker Meetings: 1 to 4 times per year    Marital Status: Divorced  Catering manager Violence: Not At Risk (06/26/2020)   Humiliation, Afraid, Rape, and Kick questionnaire    Fear of Current or Ex-Partner: No    Emotionally Abused: No    Physically Abused: No    Sexually Abused: No    Outpatient Medications Prior to Visit  Medication Sig Dispense Refill   acyclovir (ZOVIRAX) 400 MG tablet TAKE 1 TABLET BY MOUTH TWICE DAILY. 60 tablet 5    aspirin EC 81 MG tablet Take by mouth.     calcium-vitamin D (OSCAL WITH D) 500-200 MG-UNIT tablet Take 1 tablet by mouth.     dexamethasone (DECADRON) 4 MG tablet Take 10 tablets (40 mg total) by mouth once a week. 40 tablet 6   lenalidomide (REVLIMID) 10 MG capsule TAKE 1 CAPSULE BY MOUTH 1 TIME A DAY FOR 21 DAYS ON THEN 7 DAYS OFF 21 capsule 0   metoprolol succinate (TOPROL-XL) 25 MG 24 hr tablet TAKE 1 TABLET BY MOUTH DAILY 90 tablet 3   Multiple Vitamin (MULTIVITAMIN) tablet Take 1 tablet by mouth daily.       Multiple Vitamins/Iron TABS Take by mouth.     nitroGLYCERIN (NITROSTAT) 0.4 MG SL tablet Place 1 tablet (0.4 mg total) under the tongue every 5 (five) minutes for 3 doses as needed for chest pain. 25 tablet 3   ondansetron (ZOFRAN) 8 MG tablet      prochlorperazine (COMPAZINE) 10 MG tablet      traMADol (ULTRAM) 50 MG tablet Take 1 tablet (50 mg total) by mouth every 6 (six) hours as needed. 60 tablet 0   rosuvastatin (CRESTOR) 40 MG tablet Take 1 tablet (40 mg total) by mouth every other day. 90 tablet 1   No facility-administered medications prior to visit.    No Known Allergies  ROS Review of Systems  Constitutional:  Positive for fatigue. Negative for chills and fever.  HENT:  Negative for congestion and sore throat.   Eyes:  Negative for pain and discharge.  Respiratory:  Negative for cough and shortness of breath.   Cardiovascular:  Negative for chest pain and palpitations.  Gastrointestinal:  Negative for constipation, diarrhea, nausea and vomiting.  Endocrine: Negative for polydipsia and polyuria.  Genitourinary:  Negative for dysuria and hematuria.  Musculoskeletal:  Positive for arthralgias. Negative for neck pain and neck stiffness.  Skin:  Negative for rash.  Neurological:  Negative for dizziness, weakness, numbness and headaches.  Psychiatric/Behavioral:  Negative for agitation and behavioral problems.       Objective:    Physical Exam Vitals reviewed.   Constitutional:      General: He is not in acute distress.    Appearance: He is obese. He is not diaphoretic.  HENT:     Head: Normocephalic and atraumatic.     Nose: Nose normal.     Mouth/Throat:     Mouth: Mucous membranes are moist.  Eyes:     General: No scleral icterus.    Extraocular Movements: Extraocular movements intact.  Cardiovascular:     Rate and Rhythm: Normal rate and regular rhythm.  Heart sounds: Normal heart sounds. No murmur heard. Pulmonary:     Breath sounds: Normal breath sounds. No wheezing or rales.  Musculoskeletal:     Cervical back: Neck supple. No tenderness.     Right lower leg: No edema.     Left lower leg: No edema.  Skin:    General: Skin is warm.     Findings: No rash.  Neurological:     General: No focal deficit present.     Mental Status: He is alert and oriented to person, place, and time.     Sensory: No sensory deficit.     Motor: No weakness.  Psychiatric:        Mood and Affect: Mood normal.        Behavior: Behavior normal.     BP 138/70 (BP Location: Left Arm)   Pulse 84   Ht 5\' 9"  (1.753 m)   Wt 230 lb 9.6 oz (104.6 kg)   SpO2 96%   BMI 34.05 kg/m  Wt Readings from Last 3 Encounters:  12/18/22 230 lb 9.6 oz (104.6 kg)  11/24/22 236 lb (107 kg)  10/05/22 77 lb 2.6 oz (35 kg)    Lab Results  Component Value Date   TSH 3.892 06/30/2022   Lab Results  Component Value Date   WBC 4.5 11/17/2022   HGB 13.3 11/17/2022   HCT 40.5 11/17/2022   MCV 98.8 11/17/2022   PLT 217 11/17/2022   Lab Results  Component Value Date   NA 137 11/17/2022   K 3.6 11/17/2022   CO2 26 11/17/2022   GLUCOSE 92 11/17/2022   BUN 17 11/17/2022   CREATININE 1.03 11/17/2022   BILITOT 0.8 11/17/2022   ALKPHOS 52 11/17/2022   AST 38 11/17/2022   ALT 35 11/17/2022   PROT 6.5 11/17/2022   ALBUMIN 3.9 11/17/2022   CALCIUM 8.8 (L) 11/17/2022   ANIONGAP 7 11/17/2022   Lab Results  Component Value Date   CHOL 150 06/30/2022   Lab  Results  Component Value Date   HDL 51 06/30/2022   Lab Results  Component Value Date   LDLCALC 87 06/30/2022   Lab Results  Component Value Date   TRIG 62 06/30/2022   Lab Results  Component Value Date   CHOLHDL 2.9 06/30/2022   Lab Results  Component Value Date   HGBA1C 4.7 (L) 06/30/2022      Assessment & Plan:   Problem List Items Addressed This Visit       Cardiovascular and Mediastinum   CAD (coronary artery disease)    Has had cardiac cath in the past, no stents On Aspirin, statin and B-blocker Counseled again to stay compliant to statin therapy F/u with Cardiology      Relevant Medications   rosuvastatin (CRESTOR) 40 MG tablet   Essential hypertension - Primary    BP Readings from Last 1 Encounters:  12/18/22 138/70   overall well-controlled with Metoprolol Counseled for compliance with the medications Advised DASH diet and moderate exercise/walking as tolerated      Relevant Medications   rosuvastatin (CRESTOR) 40 MG tablet     Other   Multiple myeloma without remission (HCC)    Undergoing chemotherapy for MM S/p stem cell collection at [facility] Followed by Oncology      Mixed hyperlipidemia    Needs to take Crestor, at least QOD Reviewed last lipid profile      Relevant Medications   rosuvastatin (CRESTOR) 40 MG tablet   Meds  ordered this encounter  Medications   rosuvastatin (CRESTOR) 40 MG tablet    Sig: Take 1 tablet (40 mg total) by mouth every other day.    Dispense:  90 tablet    Refill:  1    Follow-up: Return in about 6 months (around 06/18/2023).    Anabel Halon, MD

## 2022-12-19 ENCOUNTER — Other Ambulatory Visit: Payer: Self-pay

## 2022-12-21 ENCOUNTER — Other Ambulatory Visit: Payer: Self-pay

## 2022-12-21 DIAGNOSIS — C9000 Multiple myeloma not having achieved remission: Secondary | ICD-10-CM

## 2022-12-22 ENCOUNTER — Inpatient Hospital Stay: Payer: Medicare Other | Attending: Hematology

## 2022-12-22 ENCOUNTER — Inpatient Hospital Stay: Payer: Medicare Other

## 2022-12-22 VITALS — BP 146/77 | HR 69 | Temp 97.9°F | Resp 17 | Wt 231.6 lb

## 2022-12-22 DIAGNOSIS — Z5112 Encounter for antineoplastic immunotherapy: Secondary | ICD-10-CM | POA: Insufficient documentation

## 2022-12-22 DIAGNOSIS — C9000 Multiple myeloma not having achieved remission: Secondary | ICD-10-CM | POA: Diagnosis present

## 2022-12-22 LAB — CBC WITH DIFFERENTIAL/PLATELET
Abs Immature Granulocytes: 0.01 10*3/uL (ref 0.00–0.07)
Basophils Absolute: 0 10*3/uL (ref 0.0–0.1)
Basophils Relative: 1 %
Eosinophils Absolute: 0.3 10*3/uL (ref 0.0–0.5)
Eosinophils Relative: 7 %
HCT: 42 % (ref 39.0–52.0)
Hemoglobin: 13.9 g/dL (ref 13.0–17.0)
Immature Granulocytes: 0 %
Lymphocytes Relative: 56 %
Lymphs Abs: 2.2 10*3/uL (ref 0.7–4.0)
MCH: 32.6 pg (ref 26.0–34.0)
MCHC: 33.1 g/dL (ref 30.0–36.0)
MCV: 98.6 fL (ref 80.0–100.0)
Monocytes Absolute: 0.4 10*3/uL (ref 0.1–1.0)
Monocytes Relative: 11 %
Neutro Abs: 1 10*3/uL — ABNORMAL LOW (ref 1.7–7.7)
Neutrophils Relative %: 25 %
Platelets: 245 10*3/uL (ref 150–400)
RBC: 4.26 MIL/uL (ref 4.22–5.81)
RDW: 13.3 % (ref 11.5–15.5)
WBC: 4 10*3/uL (ref 4.0–10.5)
nRBC: 0 % (ref 0.0–0.2)

## 2022-12-22 LAB — COMPREHENSIVE METABOLIC PANEL
ALT: 26 U/L (ref 0–44)
AST: 27 U/L (ref 15–41)
Albumin: 3.7 g/dL (ref 3.5–5.0)
Alkaline Phosphatase: 58 U/L (ref 38–126)
Anion gap: 11 (ref 5–15)
BUN: 12 mg/dL (ref 8–23)
CO2: 28 mmol/L (ref 22–32)
Calcium: 9.4 mg/dL (ref 8.9–10.3)
Chloride: 102 mmol/L (ref 98–111)
Creatinine, Ser: 1.01 mg/dL (ref 0.61–1.24)
GFR, Estimated: >60 mL/min (ref >60)
Glucose, Bld: 113 mg/dL — ABNORMAL HIGH (ref 70–99)
Potassium: 3.9 mmol/L (ref 3.5–5.1)
Sodium: 141 mmol/L (ref 135–145)
Total Bilirubin: 0.5 mg/dL (ref <1.2)
Total Protein: 6.6 g/dL (ref 6.5–8.1)

## 2022-12-22 LAB — MAGNESIUM: Magnesium: 1.8 mg/dL (ref 1.7–2.4)

## 2022-12-22 MED ORDER — ZOLEDRONIC ACID 4 MG/100ML IV SOLN: 4.0000 mg | Freq: Once | INTRAVENOUS | Status: DC

## 2022-12-22 MED ORDER — DARATUMUMAB-HYALURONIDASE-FIHJ 1800-30000 MG-UT/15ML ~~LOC~~ SOLN
1800.0000 mg | Freq: Once | SUBCUTANEOUS | Status: AC
  Administered 2022-12-22: 1800 mg via SUBCUTANEOUS
  Filled 2022-12-22: qty 15

## 2022-12-22 MED ORDER — CETIRIZINE HCL 10 MG PO TABS
10.0000 mg | ORAL_TABLET | Freq: Once | ORAL | Status: AC
  Administered 2022-12-22: 10 mg via ORAL
  Filled 2022-12-22: qty 1

## 2022-12-22 MED ORDER — SODIUM CHLORIDE 0.9 % IV SOLN: Freq: Once | INTRAVENOUS | Status: DC

## 2022-12-22 NOTE — Patient Instructions (Signed)
[facility] [place] - [facility] DE[facility]  Discharge Instructions: Thank you for choosing [facility] to provide your oncology and hematology care.  If you have a lab appointment with the Cancer Center - please note that after April 8th, 2024, all labs will be drawn in the cancer center.  You do not have to check in or register with the main entrance as you have in the past but will complete your check-in in the cancer center.  Wear comfortable clothing and clothing appropriate for easy access to any Portacath or PICC line.   We strive to give you quality time with your provider. You may need to reschedule your appointment if you arrive late (15 or more minutes).  Arriving late affects you and other patients whose appointments are after yours.  Also, if you miss three or more appointments without notifying the office, you may be dismissed from the clinic at the provider's discretion.      For prescription refill requests, have your pharmacy contact our office and allow 72 hours for refills to be completed.    Today you received the following chemotherapy and/or immunotherapy agents Daratumumab, return as scheduled.   To help prevent nausea and vomiting after your treatment, we encourage you to take your nausea medication as directed.  BELOW ARE SYMPTOMS THAT SHOULD BE REPORTED IMMEDIATELY: *FEVER GREATER THAN 100.4 F (38 C) OR HIGHER *CHILLS OR SWEATING *NAUSEA AND VOMITING THAT IS NOT CONTROLLED WITH YOUR NAUSEA MEDICATION *UNUSUAL SHORTNESS OF BREATH *UNUSUAL BRUISING OR BLEEDING *URINARY PROBLEMS (pain or burning when urinating, or frequent urination) *BOWEL PROBLEMS (unusual diarrhea, constipation, pain near the anus) TENDERNESS IN MOUTH AND THROAT WITH OR WITHOUT PRESENCE OF ULCERS (sore throat, sores in mouth, or a toothache) UNUSUAL RASH, SWELLING OR PAIN  UNUSUAL VAGINAL DISCHARGE OR ITCHING   Items with * indicate a potential emergency and  should be followed up as soon as possible or go to the Emergency Department if any problems should occur.  Please show the CHEMOTHERAPY ALERT CARD or IMMUNOTHERAPY ALERT CARD at check-in to the Emergency Department and triage nurse.  Should you have questions after your visit or need to cancel or reschedule your appointment, please contact [facility] CTR [place] - A DEPT OF Eligha Bridegroom [facility] [phone]  and follow the prompts.  Office hours are 8:00 a.m. to 4:30 p.m. Monday - Friday. Please note that voicemails left after 4:00 p.m. may not be returned until the following business day.  We are closed weekends and major holidays. You have access to a nurse at all times for urgent questions. Please call the main number to the clinic [phone] and follow the prompts.  For any non-urgent questions, you may also contact your provider using MyChart. We now offer e-Visits for anyone 2 and older to request care online for non-urgent symptoms. For details visit [email or web address].   Also download the MyChart app! Go to the app store, search "MyChart", open the app, select [facility], and log in with your MyChart username and password.

## 2022-12-22 NOTE — Progress Notes (Signed)
Patient tolerated Daratumumab injection with no complaints voiced. See MAR for details. Lab reviewed. Injection site clean and dry with no bruising or swelling noted at site. Band aid applied. Vss with discharge and left in satisfactory condition with nos/s of distress noted.  

## 2022-12-28 ENCOUNTER — Other Ambulatory Visit: Payer: Self-pay | Admitting: Hematology

## 2022-12-28 ENCOUNTER — Other Ambulatory Visit: Payer: Self-pay

## 2022-12-28 MED ORDER — LENALIDOMIDE 10 MG PO CAPS: ORAL_CAPSULE | ORAL | 0 refills | Status: DC

## 2022-12-28 NOTE — Telephone Encounter (Signed)
Chart reviewed. Revlimid refilled per last office note with Dr. Katragadda.  

## 2023-01-18 ENCOUNTER — Other Ambulatory Visit: Payer: Self-pay

## 2023-01-18 DIAGNOSIS — C9000 Multiple myeloma not having achieved remission: Secondary | ICD-10-CM

## 2023-01-19 ENCOUNTER — Encounter: Payer: Self-pay | Admitting: Hematology

## 2023-01-19 ENCOUNTER — Inpatient Hospital Stay: Payer: Medicare Other | Attending: Hematology

## 2023-01-19 ENCOUNTER — Inpatient Hospital Stay: Payer: Medicare Other

## 2023-01-19 VITALS — BP 114/85 | HR 67 | Temp 96.5°F | Resp 20 | Wt 233.9 lb

## 2023-01-19 DIAGNOSIS — C9000 Multiple myeloma not having achieved remission: Secondary | ICD-10-CM

## 2023-01-19 DIAGNOSIS — Z5112 Encounter for antineoplastic immunotherapy: Secondary | ICD-10-CM | POA: Insufficient documentation

## 2023-01-19 LAB — CBC WITH DIFFERENTIAL/PLATELET
Abs Immature Granulocytes: 0.01 10*3/uL (ref 0.00–0.07)
Basophils Absolute: 0 10*3/uL (ref 0.0–0.1)
Basophils Relative: 1 %
Eosinophils Absolute: 0.2 10*3/uL (ref 0.0–0.5)
Eosinophils Relative: 5 %
HCT: 42.9 % (ref 39.0–52.0)
Hemoglobin: 14.2 g/dL (ref 13.0–17.0)
Immature Granulocytes: 0 %
Lymphocytes Relative: 62 %
Lymphs Abs: 2.6 10*3/uL (ref 0.7–4.0)
MCH: 32.3 pg (ref 26.0–34.0)
MCHC: 33.1 g/dL (ref 30.0–36.0)
MCV: 97.5 fL (ref 80.0–100.0)
Monocytes Absolute: 0.4 10*3/uL (ref 0.1–1.0)
Monocytes Relative: 9 %
Neutro Abs: 0.9 10*3/uL — ABNORMAL LOW (ref 1.7–7.7)
Neutrophils Relative %: 23 %
Platelets: 228 10*3/uL (ref 150–400)
RBC: 4.4 MIL/uL (ref 4.22–5.81)
RDW: 13.4 % (ref 11.5–15.5)
Smear Review: ADEQUATE
WBC: 4.1 10*3/uL (ref 4.0–10.5)
nRBC: 0 % (ref 0.0–0.2)

## 2023-01-19 LAB — COMPREHENSIVE METABOLIC PANEL
ALT: 27 U/L (ref 0–44)
AST: 33 U/L (ref 15–41)
Albumin: 3.8 g/dL (ref 3.5–5.0)
Alkaline Phosphatase: 58 U/L (ref 38–126)
Anion gap: 5 (ref 5–15)
BUN: 15 mg/dL (ref 8–23)
CO2: 28 mmol/L (ref 22–32)
Calcium: 8.9 mg/dL (ref 8.9–10.3)
Chloride: 104 mmol/L (ref 98–111)
Creatinine, Ser: 1.03 mg/dL (ref 0.61–1.24)
GFR, Estimated: >60 mL/min (ref >60)
Glucose, Bld: 124 mg/dL — ABNORMAL HIGH (ref 70–99)
Potassium: 3.8 mmol/L (ref 3.5–5.1)
Sodium: 137 mmol/L (ref 135–145)
Total Bilirubin: 0.7 mg/dL (ref 0.0–1.2)
Total Protein: 6.6 g/dL (ref 6.5–8.1)

## 2023-01-19 LAB — MAGNESIUM: Magnesium: 1.7 mg/dL (ref 1.7–2.4)

## 2023-01-19 MED ORDER — DARATUMUMAB-HYALURONIDASE-FIHJ 1800-30000 MG-UT/15ML ~~LOC~~ SOLN
1800.0000 mg | Freq: Once | SUBCUTANEOUS | Status: AC
  Administered 2023-01-19: 1800 mg via SUBCUTANEOUS
  Filled 2023-01-19: qty 15

## 2023-01-19 MED ORDER — CETIRIZINE HCL 10 MG PO TABS
10.0000 mg | ORAL_TABLET | Freq: Once | ORAL | Status: AC
Start: 2023-01-19 — End: 2023-01-19
  Administered 2023-01-19: 10 mg via ORAL
  Filled 2023-01-19: qty 1

## 2023-01-19 NOTE — Patient Instructions (Signed)
 [facility] [place] - [facility]  Discharge Instructions: Thank you for choosing [facility] to provide your oncology and hematology care.  If you have a lab appointment with the Cancer Center - please note that after April 8th, 2024, all labs will be drawn in the cancer center.  You do not have to check in or register with the main entrance as you have in the past but will complete your check-in in the cancer center.  Wear comfortable clothing and clothing appropriate for easy access to any Portacath or PICC line.   We strive to give you quality time with your provider. You may need to reschedule your appointment if you arrive late (15 or more minutes).  Arriving late affects you and other patients whose appointments are after yours.  Also, if you miss three or more appointments without notifying the office, you may be dismissed from the clinic at the provider's discretion.      For prescription refill requests, have your pharmacy contact our office and allow 72 hours for refills to be completed.    Today you received the following chemotherapy and/or immunotherapy agents Darzalex Faspro. Daratumumab; Hyaluronidase Injection What is this medication? DARATUMUMAB; HYALURONIDASE (dar a toom ue mab; hye al ur ON i dase) treats multiple myeloma, a type of bone marrow cancer. Daratumumab works by blocking a protein that causes cancer cells to grow and multiply. This helps to slow or stop the spread of cancer cells. Hyaluronidase works by increasing the absorption of other medications in the body to help them work better. This medication may also be used treat amyloidosis, a condition that causes the buildup of a protein (amyloid) in your body. It works by reducing the buildup of this protein, which decreases symptoms. It is a combination medication that contains a monoclonal antibody. This medicine may be used for other purposes; ask your health care  provider or pharmacist if you have questions. COMMON BRAND NAME(S): DARZALEX FASPRO What should I tell my care team before I take this medication? They need to know if you have any of these conditions: Heart disease Infection, such as chickenpox, cold sores, herpes, hepatitis B Lung or breathing disease An unusual or allergic reaction to daratumumab, hyaluronidase, other medications, foods, dyes, or preservatives Pregnant or trying to get pregnant Breast-feeding How should I use this medication? This medication is injected under the skin. It is given by your care team in a hospital or clinic setting. Talk to your care team about the use of this medication in children. Special care may be needed. Overdosage: If you think you have taken too much of this medicine contact a poison control center or emergency room at once. NOTE: This medicine is only for you. Do not share this medicine with others. What if I miss a dose? Keep appointments for follow-up doses. It is important not to miss your dose. Call your care team if you are unable to keep an appointment. What may interact with this medication? Interactions have not been studied. This list may not describe all possible interactions. Give your health care provider a list of all the medicines, herbs, non-prescription drugs, or dietary supplements you use. Also tell them if you smoke, drink alcohol, or use illegal drugs. Some items may interact with your medicine. What should I watch for while using this medication? Your condition will be monitored carefully while you are receiving this medication. This medication can cause serious allergic  reactions. To reduce your risk, your care team may give you other medication to take before receiving this one. Be sure to follow the directions from your care team. This medication can affect the results of blood tests to match your blood type. These changes can last for up to 6 months after the final dose.  Your care team will do blood tests to match your blood type before you start treatment. Tell all of your care team that you are being treated with this medication before receiving a blood transfusion. This medication can affect the results of some tests used to determine treatment response; extra tests may be needed to evaluate response. Talk to your care team if you wish to become pregnant or think you are pregnant. This medication can cause serious birth defects if taken during pregnancy and for 3 months after the last dose. A reliable form of contraception is recommended while taking this medication and for 3 months after the last dose. Talk to your care team about effective forms of contraception. Do not breast-feed while taking this medication. What side effects may I notice from receiving this medication? Side effects that you should report to your care team as soon as possible: Allergic reactions--skin rash, itching, hives, swelling of the face, lips, tongue, or throat Heart rhythm changes--fast or irregular heartbeat, dizziness, feeling faint or lightheaded, chest pain, trouble breathing Infection--fever, chills, cough, sore throat, wounds that don't heal, pain or trouble when passing urine, general feeling of discomfort or being unwell Infusion reactions--chest pain, shortness of breath or trouble breathing, feeling faint or lightheaded Sudden eye pain or change in vision such as blurry vision, seeing halos around lights, vision loss Unusual bruising or bleeding Side effects that usually do not require medical attention (report to your care team if they continue or are bothersome): Constipation Diarrhea Fatigue Nausea Pain, tingling, or numbness in the hands or feet Swelling of the ankles, hands, or feet This list may not describe all possible side effects. Call your doctor for medical advice about side effects. You may report side effects to FDA at 1-800-FDA-1088. Where should I keep my  medication? This medication is given in a hospital or clinic. It will not be stored at home. NOTE: This sheet is a summary. It may not cover all possible information. If you have questions about this medicine, talk to your doctor, pharmacist, or health care provider.  2024 Elsevier/Gold Standard (2021-05-06 00:00:00)      To help prevent nausea and vomiting after your treatment, we encourage you to take your nausea medication as directed.  BELOW ARE SYMPTOMS THAT SHOULD BE REPORTED IMMEDIATELY: *FEVER GREATER THAN 100.4 F (38 C) OR HIGHER *CHILLS OR SWEATING *NAUSEA AND VOMITING THAT IS NOT CONTROLLED WITH YOUR NAUSEA MEDICATION *UNUSUAL SHORTNESS OF BREATH *UNUSUAL BRUISING OR BLEEDING *URINARY PROBLEMS (pain or burning when urinating, or frequent urination) *BOWEL PROBLEMS (unusual diarrhea, constipation, pain near the anus) TENDERNESS IN MOUTH AND THROAT WITH OR WITHOUT PRESENCE OF ULCERS (sore throat, sores in mouth, or a toothache) UNUSUAL RASH, SWELLING OR PAIN  UNUSUAL VAGINAL DISCHARGE OR ITCHING   Items with * indicate a potential emergency and should be followed up as soon as possible or go to the Emergency Department if any problems should occur.  Please show the CHEMOTHERAPY ALERT CARD or IMMUNOTHERAPY ALERT CARD at check-in to the Emergency Department and triage nurse.  Should you have questions after your visit or need to cancel or reschedule your appointment, please contact [facility] CANCER  CTR [place] - A DEPT OF Eligha Bridegroom [facility] [phone]  and follow the prompts.  Office hours are 8:00 a.m. to 4:30 p.m. Monday - Friday. Please note that voicemails left after 4:00 p.m. may not be returned until the following business day.  We are closed weekends and major holidays. You have access to a nurse at all times for urgent questions. Please call the main number to the clinic [phone] and follow the prompts.  For any non-urgent questions, you may also contact your  provider using MyChart. We now offer e-Visits for anyone 18 and older to request care online for non-urgent symptoms. For details visit [email or web address].   Also download the MyChart app! Go to the app store, search "MyChart", open the app, select [facility], and log in with your MyChart username and password.

## 2023-01-19 NOTE — Progress Notes (Signed)
 Patient presents today for Darzalex  Faspro injection. Vital signs within parameters for treatment. ANC 0.9. Message received from Dr. Katragadda to proceed with treatment.   Patient taking Revlimid  as prescribed with no complication noted.   Treatment given today per MD orders. Tolerated without adverse affects. Vital signs stable. No complaints at this time. Discharged from clinic ambulatory in stable condition. Alert and oriented x 3. F/U with [facility] as scheduled.

## 2023-01-22 ENCOUNTER — Other Ambulatory Visit: Payer: Self-pay | Admitting: Hematology

## 2023-01-22 NOTE — Telephone Encounter (Signed)
 Chart reviewed. Revlimid refilled per last office note with Dr. Ellin Saba.

## 2023-02-08 ENCOUNTER — Other Ambulatory Visit: Payer: Self-pay

## 2023-02-08 DIAGNOSIS — C9000 Multiple myeloma not having achieved remission: Secondary | ICD-10-CM

## 2023-02-09 ENCOUNTER — Inpatient Hospital Stay: Payer: Medicare Other

## 2023-02-09 DIAGNOSIS — Z5112 Encounter for antineoplastic immunotherapy: Secondary | ICD-10-CM | POA: Diagnosis not present

## 2023-02-09 DIAGNOSIS — C9000 Multiple myeloma not having achieved remission: Secondary | ICD-10-CM

## 2023-02-09 LAB — CBC WITH DIFFERENTIAL/PLATELET
Abs Immature Granulocytes: 0.01 10*3/uL (ref 0.00–0.07)
Basophils Absolute: 0 10*3/uL (ref 0.0–0.1)
Basophils Relative: 1 %
Eosinophils Absolute: 0.1 10*3/uL (ref 0.0–0.5)
Eosinophils Relative: 1 %
HCT: 41.4 % (ref 39.0–52.0)
Hemoglobin: 13.8 g/dL (ref 13.0–17.0)
Immature Granulocytes: 0 %
Lymphocytes Relative: 53 %
Lymphs Abs: 2.6 10*3/uL (ref 0.7–4.0)
MCH: 32.2 pg (ref 26.0–34.0)
MCHC: 33.3 g/dL (ref 30.0–36.0)
MCV: 96.7 fL (ref 80.0–100.0)
Monocytes Absolute: 0.8 10*3/uL (ref 0.1–1.0)
Monocytes Relative: 16 %
Neutro Abs: 1.4 10*3/uL — ABNORMAL LOW (ref 1.7–7.7)
Neutrophils Relative %: 29 %
Platelets: 221 10*3/uL (ref 150–400)
RBC: 4.28 MIL/uL (ref 4.22–5.81)
RDW: 13.6 % (ref 11.5–15.5)
WBC: 4.9 10*3/uL (ref 4.0–10.5)
nRBC: 0 % (ref 0.0–0.2)

## 2023-02-16 ENCOUNTER — Inpatient Hospital Stay: Payer: Medicare Other | Attending: Hematology

## 2023-02-16 ENCOUNTER — Inpatient Hospital Stay: Payer: Medicare Other

## 2023-02-16 ENCOUNTER — Inpatient Hospital Stay: Payer: Medicare Other | Admitting: Hematology

## 2023-02-16 VITALS — BP 145/78 | HR 65 | Temp 97.0°F | Resp 18 | Wt 233.4 lb

## 2023-02-16 DIAGNOSIS — Z5112 Encounter for antineoplastic immunotherapy: Secondary | ICD-10-CM | POA: Insufficient documentation

## 2023-02-16 DIAGNOSIS — C9000 Multiple myeloma not having achieved remission: Secondary | ICD-10-CM

## 2023-02-16 LAB — CBC WITH DIFFERENTIAL/PLATELET
Abs Immature Granulocytes: 0.02 10*3/uL (ref 0.00–0.07)
Basophils Absolute: 0 10*3/uL (ref 0.0–0.1)
Basophils Relative: 1 %
Eosinophils Absolute: 0.2 10*3/uL (ref 0.0–0.5)
Eosinophils Relative: 5 %
HCT: 41.9 % (ref 39.0–52.0)
Hemoglobin: 14.1 g/dL (ref 13.0–17.0)
Immature Granulocytes: 1 %
Lymphocytes Relative: 49 %
Lymphs Abs: 1.9 10*3/uL (ref 0.7–4.0)
MCH: 32.9 pg (ref 26.0–34.0)
MCHC: 33.7 g/dL (ref 30.0–36.0)
MCV: 97.9 fL (ref 80.0–100.0)
Monocytes Absolute: 0.4 10*3/uL (ref 0.1–1.0)
Monocytes Relative: 9 %
Neutro Abs: 1.4 10*3/uL — ABNORMAL LOW (ref 1.7–7.7)
Neutrophils Relative %: 35 %
Platelets: 251 10*3/uL (ref 150–400)
RBC: 4.28 MIL/uL (ref 4.22–5.81)
RDW: 13.5 % (ref 11.5–15.5)
WBC: 3.9 10*3/uL — ABNORMAL LOW (ref 4.0–10.5)
nRBC: 0 % (ref 0.0–0.2)

## 2023-02-16 MED ORDER — DARATUMUMAB-HYALURONIDASE-FIHJ 1800-30000 MG-UT/15ML ~~LOC~~ SOLN
1800.0000 mg | Freq: Once | SUBCUTANEOUS | Status: AC
  Administered 2023-02-16: 1800 mg via SUBCUTANEOUS
  Filled 2023-02-16: qty 15

## 2023-02-16 MED ORDER — SODIUM CHLORIDE 0.9 % IV SOLN
Freq: Once | INTRAVENOUS | Status: AC
Start: 2023-02-16 — End: 2023-02-16

## 2023-02-16 MED ORDER — ZOLEDRONIC ACID 4 MG/100ML IV SOLN
4.0000 mg | Freq: Once | INTRAVENOUS | Status: AC
  Administered 2023-02-16: 4 mg via INTRAVENOUS
  Filled 2023-02-16: qty 100

## 2023-02-16 MED ORDER — CETIRIZINE HCL 10 MG PO TABS
10.0000 mg | ORAL_TABLET | Freq: Once | ORAL | Status: AC
  Administered 2023-02-16: 10 mg via ORAL
  Filled 2023-02-16: qty 1

## 2023-02-16 NOTE — Patient Instructions (Signed)
 [facility] [place] - A DEPT OF [facility]  Discharge Instructions: Thank you for choosing [facility] to provide your oncology and hematology care.  If you have a lab appointment with the Cancer Center - please note that after April 8th, 2024, all labs will be drawn in the cancer center.  You do not have to check in or register with the main entrance as you have in the past but will complete your check-in in the cancer center.  Wear comfortable clothing and clothing appropriate for easy access to any Portacath or PICC line.   We strive to give you quality time with your provider. You may need to reschedule your appointment if you arrive late (15 or more minutes).  Arriving late affects you and other patients whose appointments are after yours.  Also, if you miss three or more appointments without notifying the office, you may be dismissed from the clinic at the provider's discretion.      For prescription refill requests, have your pharmacy contact our office and allow 72 hours for refills to be completed.    Today you received the following chemotherapy and/or immunotherapy agents Daratumumab/Zometa      To help prevent nausea and vomiting after your treatment, we encourage you to take your nausea medication as directed.  BELOW ARE SYMPTOMS THAT SHOULD BE REPORTED IMMEDIATELY: *FEVER GREATER THAN 100.4 F (38 C) OR HIGHER *CHILLS OR SWEATING *NAUSEA AND VOMITING THAT IS NOT CONTROLLED WITH YOUR NAUSEA MEDICATION *UNUSUAL SHORTNESS OF BREATH *UNUSUAL BRUISING OR BLEEDING *URINARY PROBLEMS (pain or burning when urinating, or frequent urination) *BOWEL PROBLEMS (unusual diarrhea, constipation, pain near the anus) TENDERNESS IN MOUTH AND THROAT WITH OR WITHOUT PRESENCE OF ULCERS (sore throat, sores in mouth, or a toothache) UNUSUAL RASH, SWELLING OR PAIN  UNUSUAL VAGINAL DISCHARGE OR ITCHING   Items with * indicate a potential emergency and should be  followed up as soon as possible or go to the Emergency Department if any problems should occur.  Please show the CHEMOTHERAPY ALERT CARD or IMMUNOTHERAPY ALERT CARD at check-in to the Emergency Department and triage nurse.  Should you have questions after your visit or need to cancel or reschedule your appointment, please contact [facility] [place] - A DEPT OF [facility] [phone]  and follow the prompts.  Office hours are 8:00 a.m. to 4:30 p.m. Monday - Friday. Please note that voicemails left after 4:00 p.m. may not be returned until the following business day.  We are closed weekends and major holidays. You have access to a nurse at all times for urgent questions. Please call the main number to the clinic [phone] and follow the prompts.  For any non-urgent questions, you may also contact your provider using MyChart. We now offer e-Visits for anyone 21 and older to request care online for non-urgent symptoms. For details visit [email or web address].   Also download the MyChart app! Go to the app store, search "MyChart", open the app, select [place], and log in with your MyChart username and password.

## 2023-02-16 NOTE — Progress Notes (Signed)
 Patient presents today for Daratumumab  injection and Zometa  infusion per providers order.  Vital signs WNL.  Okay to proceed with Zometa  without CMP per Dr. Rogers.  Peripheral IV started and blood return noted pre and post infusion.  Stable during administration without incident; injection site WNL; see MAR for injection details. No questions or complaints noted at this time. Discharge from clinic ambulatory in stable condition.  Alert and oriented X 3.  Follow up with [facility] as scheduled.

## 2023-02-16 NOTE — Progress Notes (Incomplete)
   [facility] [address], [place] [address]    Clinic Day:  02/16/2023  Referring physician: Meldon Sport, MD  Patient Care Team: Meldon Sport, MD as PCP - General (Internal Medicine) Amanda Jungling Joyceann No, MD as PCP - Ca

## 2023-02-17 ENCOUNTER — Encounter (HOSPITAL_COMMUNITY): Payer: Self-pay | Admitting: Hematology

## 2023-02-17 ENCOUNTER — Encounter: Payer: Self-pay | Admitting: Hematology

## 2023-02-17 LAB — KAPPA/LAMBDA LIGHT CHAINS
Kappa free light chain: 19.3 mg/L (ref 3.3–19.4)
Kappa, lambda light chain ratio: 1.74 — ABNORMAL HIGH (ref 0.26–1.65)
Lambda free light chains: 11.1 mg/L (ref 5.7–26.3)

## 2023-02-18 LAB — IMMUNOFIXATION ELECTROPHORESIS
IgA: 80 mg/dL (ref 61–437)
IgG (Immunoglobin G), Serum: 819 mg/dL (ref 603–1613)
IgM (Immunoglobulin M), Srm: 31 mg/dL (ref 20–172)
Total Protein ELP: 6.3 g/dL (ref 6.0–8.5)

## 2023-02-23 ENCOUNTER — Other Ambulatory Visit: Payer: Self-pay | Admitting: Hematology

## 2023-02-23 LAB — PROTEIN ELECTROPHORESIS, SERUM
A/G Ratio: 1.8 — ABNORMAL HIGH (ref 0.7–1.7)
Albumin ELP: 4.1 g/dL (ref 2.9–4.4)
Alpha-1-Globulin: 0.2 g/dL (ref 0.0–0.4)
Alpha-2-Globulin: 0.6 g/dL (ref 0.4–1.0)
Beta Globulin: 0.9 g/dL (ref 0.7–1.3)
Gamma Globulin: 0.6 g/dL (ref 0.4–1.8)
Globulin, Total: 2.3 g/dL (ref 2.2–3.9)
Total Protein ELP: 6.4 g/dL (ref 6.0–8.5)

## 2023-02-24 ENCOUNTER — Other Ambulatory Visit: Payer: Self-pay | Admitting: Hematology

## 2023-02-24 NOTE — Telephone Encounter (Signed)
Chart reviewed. Revlimid refilled per last office note with Dr. Ellin Saba.

## 2023-03-08 ENCOUNTER — Telehealth: Payer: Self-pay | Admitting: Pharmacy Technician

## 2023-03-08 NOTE — Telephone Encounter (Signed)
 Oral Oncology Patient Advocate Encounter  Per pt's insurance, for his next fill of Revlimid, he will need to have a script for the generic which is lenalidomide. Brand Revlimid is no longer formulary.    [name], CPhT Oncology Pharmacy Patient Advocate [facility] Direct Number: [phone] Fax: [phone]

## 2023-03-12 ENCOUNTER — Other Ambulatory Visit (HOSPITAL_COMMUNITY): Payer: Self-pay

## 2023-03-12 ENCOUNTER — Telehealth: Payer: Self-pay | Admitting: Pharmacy Technician

## 2023-03-12 NOTE — Telephone Encounter (Signed)
 Oral Oncology Patient Advocate Encounter  Received message from RN that pt's PA for Revlimid would be ending on 03/06.   I attempted to test bill for generic, Lenalidomide, to see if it required a PA since his insurance sent [place] a message stating they would no longer be covering brand. Per test bill 'refill too soon, next fill date would be 03/12.'  I will attempt to test bill medication on 03/12 to see if it requires a PA.  [name], CPhT Oncology Pharmacy Patient Advocate [facility] Direct Number: [phone] Fax: [phone]

## 2023-03-16 ENCOUNTER — Encounter: Payer: Self-pay | Admitting: Hematology

## 2023-03-16 ENCOUNTER — Inpatient Hospital Stay: Payer: Medicare Other

## 2023-03-16 ENCOUNTER — Inpatient Hospital Stay (HOSPITAL_BASED_OUTPATIENT_CLINIC_OR_DEPARTMENT_OTHER): Payer: Medicare Other | Admitting: Hematology

## 2023-03-16 ENCOUNTER — Inpatient Hospital Stay: Payer: Medicare Other | Attending: Hematology

## 2023-03-16 VITALS — BP 133/86 | HR 64 | Temp 98.1°F | Resp 18 | Wt 232.4 lb

## 2023-03-16 DIAGNOSIS — M545 Low back pain, unspecified: Secondary | ICD-10-CM | POA: Insufficient documentation

## 2023-03-16 DIAGNOSIS — C9000 Multiple myeloma not having achieved remission: Secondary | ICD-10-CM

## 2023-03-16 DIAGNOSIS — Z5112 Encounter for antineoplastic immunotherapy: Secondary | ICD-10-CM | POA: Insufficient documentation

## 2023-03-16 LAB — CBC WITH DIFFERENTIAL/PLATELET
Abs Immature Granulocytes: 0.02 10*3/uL (ref 0.00–0.07)
Basophils Absolute: 0 10*3/uL (ref 0.0–0.1)
Basophils Relative: 1 %
Eosinophils Absolute: 0.2 10*3/uL (ref 0.0–0.5)
Eosinophils Relative: 5 %
HCT: 41.5 % (ref 39.0–52.0)
Hemoglobin: 13.5 g/dL (ref 13.0–17.0)
Immature Granulocytes: 0 %
Lymphocytes Relative: 56 %
Lymphs Abs: 2.5 10*3/uL (ref 0.7–4.0)
MCH: 31.9 pg (ref 26.0–34.0)
MCHC: 32.5 g/dL (ref 30.0–36.0)
MCV: 98.1 fL (ref 80.0–100.0)
Monocytes Absolute: 0.5 10*3/uL (ref 0.1–1.0)
Monocytes Relative: 11 %
Neutro Abs: 1.2 10*3/uL — ABNORMAL LOW (ref 1.7–7.7)
Neutrophils Relative %: 27 %
Platelets: 224 10*3/uL (ref 150–400)
RBC: 4.23 MIL/uL (ref 4.22–5.81)
RDW: 13.7 % (ref 11.5–15.5)
WBC: 4.5 10*3/uL (ref 4.0–10.5)
nRBC: 0 % (ref 0.0–0.2)

## 2023-03-16 MED ORDER — CETIRIZINE HCL 10 MG PO TABS
10.0000 mg | ORAL_TABLET | Freq: Once | ORAL | Status: AC
Start: 2023-03-16 — End: 2023-03-16
  Administered 2023-03-16: 10 mg via ORAL
  Filled 2023-03-16: qty 1

## 2023-03-16 MED ORDER — DARATUMUMAB-HYALURONIDASE-FIHJ 1800-30000 MG-UT/15ML ~~LOC~~ SOLN
1800.0000 mg | Freq: Once | SUBCUTANEOUS | Status: AC
  Administered 2023-03-16: 1800 mg via SUBCUTANEOUS
  Filled 2023-03-16: qty 15

## 2023-03-16 NOTE — Progress Notes (Signed)
 [facility] [address], [place] [address]    Clinic Day:  03/16/2023  Referring physician: Anabel Halon, MD  Patient Care Team: Anabel Halon, MD as PCP - General (Internal Medicine) Wyline Mood Dorothe Pea, MD as PCP - Cardiology (Cardiology) Lanelle Bal, DO as Consulting Physician (Internal Medicine) Therese Sarah, RN as Oncology Nurse Navigator (Oncology) Doreatha Massed, MD as Medical Oncologist (Oncology)   ASSESSMENT & PLAN:   Assessment: 1.  Standard risk plasma cell myeloma: - He reported low back pain radiating to the right thigh since April, pain gets worse when he walks for more than 2 blocks. - He had history of MGUS and was last seen in our clinic in 2019.  He had an abnormal M spike of 3.2 g on 12/06/2018. - Skeletal survey on 10/22/2017 showed no suspicious lytic lesions. - X-ray of the right sided pelvis on 06/11/2020 showed prominent lytic lesions noted in the proximal midportion of the right femoral diaphysis with no evidence of fracture or dislocation. - Bone marrow biopsy on 07/09/2020 with hypercellular marrow with trilineage hematopoiesis.  Plasma cells representing 10% of cells. - Chromosome analysis 46, XY (20).  Multiple myeloma FISH panel negative. - PET scan on 07/04/2020 with multiple hypermetabolic bony soft tissue lesions.  Dominant lesions in the L5 vertebral body and posterior right acetabulum and distal right femur.  Focal increased uptake in the right tonsillar region with associated soft tissue fullness on CT imaging. - 24-hour urine with total protein 134. - Beta-2 microglobulin 2.1 (06/26/2020), LDH 136. - Dara RVD cycle 1 started on 09/03/2020. - He had stem cell collection at [facility].  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 - Maintenance daratumumab monthly and Revlimid 10 mg 3 weeks on/1 week off started on 04/08/2021. - PET scan (07/03/2021): Lytic soft tissue lesion in the right L5 vertebral body  measures 3.7 x 2.8 cm SUV 2.9, previously 3.4 x 3.4 cm, SUV 6.6.  Soft tissue lesion in the posterior right acetabulum measures 2.2 cm SUV 2.5, previously 2.2 cm with SUV 9.  Hypermetabolism in the left tonsil SUV 5.9.  Direct oropharyngeal exam did not show any mass.  New mildly hypermetabolic left lower pulmonary nodules nonspecific.  2.  Social/family history: - He works at [employer] in environmental services. - He was never smoker. - 2 brothers died of metastatic lung cancer.  Sister has thyroid cancer.  Mother had breast cancer.    Plan: 1.  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  No GI side effects or infections noted. - Reviewed myeloma labs from 02/16/2023: M spike is negative.  FLC ratio is 1.74 with kappa light chains 19.3.  Immunofixation was normal.  CBC today shows normal white count and platelet count. - Continue Revlimid 10 mg 3 weeks on/1 week off.  Continue Darzalex monthly and dexamethasone 40 mg on days of Darzalex.  RTC 12 weeks for follow-up with repeat myeloma labs.  2.  Low back pain: - He developed low back pain last week when he was working on the floor.  He was told to inform us if it does not get better in the next few weeks.  3.  Infection prophylaxis: - Continue acyclovir for shingles prophylaxis and aspirin for thromboprophylaxis.  4.  Myeloma bone disease: - Calcium and creatinine within normal limits.  Continue Zometa every 12 weeks.    No  orders of the defined types were placed in this encounter.     Alben Deeds Teague,acting as a Neurosurgeon for Doreatha Massed, MD.,have documented all relevant documentation on the behalf of Doreatha Massed, MD,as directed by  Doreatha Massed, MD while in the presence of Doreatha Massed, MD.  I, Doreatha Massed MD, have reviewed the above documentation for accuracy and completeness, and I agree with the  above.      Doreatha Massed, MD   3/4/20251:18 PM  CHIEF COMPLAINT:   Diagnosis: multiple myeloma    Cancer Staging  Multiple myeloma without remission (HCC) Staging form: Plasma Cell Myeloma and Plasma Cell Disorders, AJCC 8th Edition - Clinical stage from 07/24/2020: RISS Stage I (Beta-2-microglobulin (mg/L): 2.1, Albumin (g/dL): 3.7, ISS: Stage I, High-risk cytogenetics: Absent, LDH: Normal) - Signed by Artis Delay, MD on 07/24/2020    Prior Therapy: DaraVRd 09/03/20 - 02/26/21  Current Therapy:  maintenance Revlimid/daratumumab   HISTORY OF PRESENT ILLNESS:   Oncology History  Multiple myeloma without remission (HCC)  07/09/2020 Pathology Results   BONE MARROW, ASPIRATE, CLOT, CORE:  -Hypercellular bone marrow with plasma cell neoplasm  -See comment   PERIPHERAL BLOOD:  -Slight macrocytic anemia   COMMENT:   The bone marrow is hypercellular for age with trilineage hematopoiesis and nonspecific myeloid changes.  In this background, the plasma cells are increased in number representing 10% of all cells associated with atypical cytomorphologic features.  The plasma cells display kappa light chain restriction consistent with plasma cell neoplasm.  Correlation with cytogenetic and FISH studies is recommended.    07/24/2020 Initial Diagnosis   Multiple myeloma without remission (HCC)   07/24/2020 Cancer Staging   Staging form: Plasma Cell Myeloma and Plasma Cell Disorders, AJCC 8th Edition - Clinical stage from 07/24/2020: RISS Stage I (Beta-2-microglobulin (mg/L): 2.1, Albumin (g/dL): 3.7, ISS: Stage I, High-risk cytogenetics: Absent, LDH: Normal) - Signed by Artis Delay, MD on 07/24/2020 Stage prefix: Initial diagnosis Beta 2 microglobulin range (mg/L): Less than 3.5 Albumin range (g/dL): Greater than or equal to 3.5 Cytogenetics: No abnormalities   09/03/2020 - 09/02/2021 Chemotherapy   Patient is on Treatment Plan : MYELOMA NEWLY DIAGNOSED TRANSPLANT CANDIDATE DaraVRd  (Daratumumab SQ) q21d x 6 Cycles (Induction/Consolidation)     04/16/2021 -  Chemotherapy   Patient is on Treatment Plan : MYELOMA Daratumumab SQ q28d        INTERVAL HISTORY:   Eugene [name] is a 68 y.o. male presenting to clinic today for follow up of multiple myeloma. He was last seen by me on 11/24/22.  Today, he states that he is doing well overall. His appetite level is at 100%. His energy level is at 80%. He reports new onset band like middle back pain he treats with IcyHot and OTC Tylenol. Pain began when working. He denies any other new pains. [place] is tolerating Revlimid well and denies any side effects, including nausea, vomiting, diarrhea, constipation. He denies any jaw pains or dental issues from Zometa.   PAST MEDICAL HISTORY:   Past Medical History: Past Medical History:  Diagnosis Date   Back pain    CAD (coronary artery disease)    a. 01/2017 NSTEMI/Cath: LM nl, LAD 25p - ? hypodense but no filling defect, RI nl, LCX nl, RCA nl, EF 55-65%-->Med Rx.   Erectile dysfunction    History of echocardiogram    a. 01/2017 Echo: EF 55-60%, no rwma.   Hypertension     Surgical History: Past Surgical History:  Procedure Laterality Date  LEFT HEART CATH AND CORONARY ANGIOGRAPHY N/A 01/15/2017   Procedure: LEFT HEART CATH AND CORONARY ANGIOGRAPHY;  Surgeon: [place], Peter M, MD;  Location: [facility] INVASIVE CV LAB;  Service: Cardiovascular;  Laterality: N/A;   ORIF trocanteric femoral IM Nail Right     Social History: Social History   Socioeconomic History   Marital status: Significant Other    Spouse name: Not on file   Number of children: Not on file   Years of education: Not on file   Highest education level: Not on file  Occupational History   Not on file  Tobacco Use   Smoking status: Never   Smokeless tobacco: Never  Vaping Use   Vaping status: Never Used  Substance and Sexual Activity   Alcohol use: No   Drug use: No   Sexual activity: Yes  Other Topics Concern   Not  on file  Social History Narrative   Not on file   Social Drivers of Health   Financial Resource Strain: Low Risk  (06/26/2020)   Overall Financial Resource Strain (CARDIA)    Difficulty of Paying Living Expenses: Not hard at all  Food Insecurity: No Food Insecurity (06/26/2020)   Hunger Vital Sign    Worried About Running Out of Food in the Last Year: Never true    Ran Out of Food in the Last Year: Never true  Transportation Needs: No Transportation Needs (06/26/2020)   PRAPARE - Administrator, Civil Service (Medical): No    Lack of Transportation (Non-Medical): No  Physical Activity: Insufficiently Active (06/26/2020)   Exercise Vital Sign    Days of Exercise per Week: 2 days    Minutes of Exercise per Session: 10 min  Stress: No Stress Concern Present (06/26/2020)   [employer] of Occupational Health - Occupational Stress Questionnaire    Feeling of Stress : Not at all  Social Connections: Moderately Integrated (06/26/2020)   Social Connection and Isolation Panel [NHANES]    Frequency of Communication with Friends and Family: More than three times a week    Frequency of Social Gatherings with Friends and Family: More than three times a week    Attends Religious Services: 1 to 4 times per year    Active Member of [employer] or Organizations: No    Attends Banker Meetings: 1 to 4 times per year    Marital Status: Divorced  Catering manager Violence: Not At Risk (06/26/2020)   Humiliation, Afraid, Rape, and Kick questionnaire    Fear of Current or Ex-Partner: No    Emotionally Abused: No    Physically Abused: No    Sexually Abused: No    Family History: Family History  Problem Relation Age of Onset   Cancer Mother        breast   Diabetes Mother    Diabetes Sister    Hypertension Sister    Cancer Brother    Mental illness Sister     Current Medications:  Current Outpatient Medications:    acyclovir (ZOVIRAX) 400 MG tablet, TAKE 1 TABLET BY  MOUTH TWICE DAILY., Disp: 60 tablet, Rfl: 5   aspirin EC 81 MG tablet, Take by mouth., Disp: , Rfl:    calcium-vitamin D (OSCAL WITH D) 500-200 MG-UNIT tablet, Take 1 tablet by mouth., Disp: , Rfl:    dexamethasone (DECADRON) 4 MG tablet, TAKE 10 TABLETS BY MOUTH ONCE A WEEK, Disp: 40 tablet, Rfl: 6   lenalidomide (REVLIMID) 10 MG capsule, TAKE 1 CAPSULE BY  MOUTH 1 TIME A DAY FOR 21 DAYS ON THEN 7 DAYS OFF, Disp: 21 capsule, Rfl: 0   metoprolol succinate (TOPROL-XL) 25 MG 24 hr tablet, TAKE 1 TABLET BY MOUTH DAILY, Disp: 90 tablet, Rfl: 3   Multiple Vitamin (MULTIVITAMIN) tablet, Take 1 tablet by mouth daily.  , Disp: , Rfl:    Multiple Vitamins/Iron TABS, Take by mouth., Disp: , Rfl:    nitroGLYCERIN (NITROSTAT) 0.4 MG SL tablet, Place 1 tablet (0.4 mg total) under the tongue every 5 (five) minutes for 3 doses as needed for chest pain., Disp: 25 tablet, Rfl: 3   ondansetron (ZOFRAN) 8 MG tablet, , Disp: , Rfl:    prochlorperazine (COMPAZINE) 10 MG tablet, , Disp: , Rfl:    rosuvastatin (CRESTOR) 40 MG tablet, Take 1 tablet (40 mg total) by mouth every other day., Disp: 90 tablet, Rfl: 1   traMADol (ULTRAM) 50 MG tablet, Take 1 tablet (50 mg total) by mouth every 6 (six) hours as needed., Disp: 60 tablet, Rfl: 0   Allergies: No Known Allergies  REVIEW OF SYSTEMS:   Review of Systems  Constitutional:  Negative for chills, fatigue and fever.  HENT:   Negative for lump/mass, mouth sores, nosebleeds, sore throat and trouble swallowing.   Eyes:  Negative for eye problems.  Respiratory:  Negative for cough and shortness of breath.   Cardiovascular:  Negative for chest pain, leg swelling and palpitations.  Gastrointestinal:  Negative for abdominal pain, constipation, diarrhea, nausea and vomiting.  Genitourinary:  Negative for bladder incontinence, difficulty urinating, dysuria, frequency, hematuria and nocturia.   Musculoskeletal:  Positive for back pain (lower, 5/10 severity). Negative for  arthralgias, flank pain, myalgias and neck pain.  Skin:  Negative for itching and rash.  Neurological:  Negative for dizziness, headaches and numbness.  Hematological:  Does not bruise/bleed easily.  Psychiatric/Behavioral:  Negative for depression, sleep disturbance and suicidal ideas. The patient is not nervous/anxious.   All other systems reviewed and are negative.    VITALS:   Blood pressure 133/86, pulse 64, temperature 98.1 F (36.7 C), temperature source Oral, resp. rate 18, weight 232 lb 5.8 oz (105.4 kg), SpO2 100%.  Wt Readings from Last 3 Encounters:  03/16/23 232 lb 5.8 oz (105.4 kg)  02/16/23 233 lb 6.4 oz (105.9 kg)  01/19/23 233 lb 14.5 oz (106.1 kg)    Body mass index is 34.31 kg/m.  Performance status (ECOG): 1 - Symptomatic but completely ambulatory  PHYSICAL EXAM:   Physical Exam Vitals and nursing note reviewed. Exam conducted with a chaperone present.  Constitutional:      Appearance: Normal appearance.  Cardiovascular:     Rate and Rhythm: Normal rate and regular rhythm.     Pulses: Normal pulses.     Heart sounds: Normal heart sounds.  Pulmonary:     Effort: Pulmonary effort is normal.     Breath sounds: Normal breath sounds.  Abdominal:     Palpations: Abdomen is soft. There is no hepatomegaly, splenomegaly or mass.     Tenderness: There is no abdominal tenderness.  Musculoskeletal:     Right lower leg: No edema.     Left lower leg: No edema.  Lymphadenopathy:     Cervical: No cervical adenopathy.     Right cervical: No superficial, deep or posterior cervical adenopathy.    Left cervical: No superficial, deep or posterior cervical adenopathy.     Upper Body:     Right upper body: No supraclavicular or axillary adenopathy.  Left upper body: No supraclavicular or axillary adenopathy.  Neurological:     General: No focal deficit present.     Mental Status: He is alert and oriented to person, place, and time.  Psychiatric:        Mood and  Affect: Mood normal.        Behavior: Behavior normal.     LABS:      Latest Ref Rng & Units 03/16/2023   11:39 AM 02/16/2023   12:54 PM 02/09/2023    2:01 PM  CBC  WBC 4.0 - 10.5 K/uL 4.5  3.9  4.9   Hemoglobin 13.0 - 17.0 g/dL 28.4  13.2  44.0   Hematocrit 39.0 - 52.0 % 41.5  41.9  41.4   Platelets 150 - 400 K/uL 224  251  221       Latest Ref Rng & Units 01/19/2023   12:25 PM 12/22/2022   12:57 PM 11/17/2022    1:31 PM  CMP  Glucose 70 - 99 mg/dL 102  725  92   BUN 8 - 23 mg/dL 15  12  17    Creatinine 0.61 - 1.24 mg/dL 3.66  4.40  3.47   Sodium 135 - 145 mmol/L 137  141  137   Potassium 3.5 - 5.1 mmol/L 3.8  3.9  3.6   Chloride 98 - 111 mmol/L 104  102  104   CO2 22 - 32 mmol/L 28  28  26    Calcium 8.9 - 10.3 mg/dL 8.9  9.4  8.8   Total Protein 6.5 - 8.1 g/dL 6.6  6.6  6.5   Total Bilirubin 0.0 - 1.2 mg/dL 0.7  0.5  0.8   Alkaline Phos 38 - 126 U/L 58  58  52   AST 15 - 41 U/L 33  27  38   ALT 0 - 44 U/L 27  26  35      No results found for: "CEA1", "CEA" / No results found for: "CEA1", "CEA" Lab Results  Component Value Date   PSA1 0.5 07/16/2020   No results found for: "QQV956" No results found for: "CAN125"  Lab Results  Component Value Date   TOTALPROTELP 6.4 02/16/2023   TOTALPROTELP 6.3 02/16/2023   ALBUMINELP 4.1 02/16/2023   A1GS 0.2 02/16/2023   A2GS 0.6 02/16/2023   BETS 0.9 02/16/2023   BETA2SER 0.3 12/07/2018   GAMS 0.6 02/16/2023   MSPIKE Not Observed 02/16/2023   SPEI Comment 02/16/2023   Lab Results  Component Value Date   FERRITIN 210 10/05/2017   Lab Results  Component Value Date   LDH 108 07/07/2022   LDH 116 06/09/2022   LDH 106 03/10/2022     STUDIES:   No results found.

## 2023-03-16 NOTE — Progress Notes (Signed)
Patient is taking Revlimid as prescribed.  He has not missed any doses and reports no side effects at this time.    Patient has been examined by Dr. Katragadda. Vital signs and labs have been reviewed by MD - ANC, Creatinine, LFTs, hemoglobin, and platelets are within treatment parameters per M.D. - pt may proceed with treatment.  Primary RN and pharmacy notified.  

## 2023-03-16 NOTE — Patient Instructions (Signed)

## 2023-03-16 NOTE — Progress Notes (Signed)
 Patient presents today for treatment Darzalex Faspro and follow up visit with Dr. Ellin Saba. Vital signs and lab work within parameters for treatment. MAR reviewed and updated. Patient has no complaints of any changes since his last treatment.   Treatment given today per MD orders. Tolerated without adverse affects. Vital signs stable. No complaints at this time. Discharged from clinic ambulatory in stable condition. Alert and oriented x 3. F/U with [facility] as scheduled.

## 2023-03-16 NOTE — Patient Instructions (Signed)
 [facility] [place] - [facility]  Discharge Instructions: Thank you for choosing [facility] to provide your oncology and hematology care.  If you have a lab appointment with the Cancer Center - please note that after April 8th, 2024, all labs will be drawn in the cancer center.  You do not have to check in or register with the main entrance as you have in the past but will complete your check-in in the cancer center.  Wear comfortable clothing and clothing appropriate for easy access to any Portacath or PICC line.   We strive to give you quality time with your provider. You may need to reschedule your appointment if you arrive late (15 or more minutes).  Arriving late affects you and other patients whose appointments are after yours.  Also, if you miss three or more appointments without notifying the office, you may be dismissed from the clinic at the provider's discretion.      For prescription refill requests, have your pharmacy contact our office and allow 72 hours for refills to be completed.    Today you received the following chemotherapy and/or immunotherapy agents Darzalex Faspro. Daratumumab; Hyaluronidase Injection What is this medication? DARATUMUMAB; HYALURONIDASE (dar a toom ue mab; hye al ur ON i dase) treats multiple myeloma, a type of bone marrow cancer. Daratumumab works by blocking a protein that causes cancer cells to grow and multiply. This helps to slow or stop the spread of cancer cells. Hyaluronidase works by increasing the absorption of other medications in the body to help them work better. This medication may also be used treat amyloidosis, a condition that causes the buildup of a protein (amyloid) in your body. It works by reducing the buildup of this protein, which decreases symptoms. It is a combination medication that contains a monoclonal antibody. This medicine may be used for other purposes; ask your health care  provider or pharmacist if you have questions. COMMON BRAND NAME(S): DARZALEX FASPRO What should I tell my care team before I take this medication? They need to know if you have any of these conditions: Heart disease Infection, such as chickenpox, cold sores, herpes, hepatitis B Lung or breathing disease An unusual or allergic reaction to daratumumab, hyaluronidase, other medications, foods, dyes, or preservatives Pregnant or trying to get pregnant Breast-feeding How should I use this medication? This medication is injected under the skin. It is given by your care team in a hospital or clinic setting. Talk to your care team about the use of this medication in children. Special care may be needed. Overdosage: If you think you have taken too much of this medicine contact a poison control center or emergency room at once. NOTE: This medicine is only for you. Do not share this medicine with others. What if I miss a dose? Keep appointments for follow-up doses. It is important not to miss your dose. Call your care team if you are unable to keep an appointment. What may interact with this medication? Interactions have not been studied. This list may not describe all possible interactions. Give your health care provider a list of all the medicines, herbs, non-prescription drugs, or dietary supplements you use. Also tell them if you smoke, drink alcohol, or use illegal drugs. Some items may interact with your medicine. What should I watch for while using this medication? Your condition will be monitored carefully while you are receiving this medication. This medication can cause serious allergic  reactions. To reduce your risk, your care team may give you other medication to take before receiving this one. Be sure to follow the directions from your care team. This medication can affect the results of blood tests to match your blood type. These changes can last for up to 6 months after the final dose.  Your care team will do blood tests to match your blood type before you start treatment. Tell all of your care team that you are being treated with this medication before receiving a blood transfusion. This medication can affect the results of some tests used to determine treatment response; extra tests may be needed to evaluate response. Talk to your care team if you wish to become pregnant or think you are pregnant. This medication can cause serious birth defects if taken during pregnancy and for 3 months after the last dose. A reliable form of contraception is recommended while taking this medication and for 3 months after the last dose. Talk to your care team about effective forms of contraception. Do not breast-feed while taking this medication. What side effects may I notice from receiving this medication? Side effects that you should report to your care team as soon as possible: Allergic reactions--skin rash, itching, hives, swelling of the face, lips, tongue, or throat Heart rhythm changes--fast or irregular heartbeat, dizziness, feeling faint or lightheaded, chest pain, trouble breathing Infection--fever, chills, cough, sore throat, wounds that don't heal, pain or trouble when passing urine, general feeling of discomfort or being unwell Infusion reactions--chest pain, shortness of breath or trouble breathing, feeling faint or lightheaded Sudden eye pain or change in vision such as blurry vision, seeing halos around lights, vision loss Unusual bruising or bleeding Side effects that usually do not require medical attention (report to your care team if they continue or are bothersome): Constipation Diarrhea Fatigue Nausea Pain, tingling, or numbness in the hands or feet Swelling of the ankles, hands, or feet This list may not describe all possible side effects. Call your doctor for medical advice about side effects. You may report side effects to FDA at 1-800-FDA-1088. Where should I keep my  medication? This medication is given in a hospital or clinic. It will not be stored at home. NOTE: This sheet is a summary. It may not cover all possible information. If you have questions about this medicine, talk to your doctor, pharmacist, or health care provider.  2024 Elsevier/Gold Standard (2021-05-06 00:00:00)      To help prevent nausea and vomiting after your treatment, we encourage you to take your nausea medication as directed.  BELOW ARE SYMPTOMS THAT SHOULD BE REPORTED IMMEDIATELY: *FEVER GREATER THAN 100.4 F (38 C) OR HIGHER *CHILLS OR SWEATING *NAUSEA AND VOMITING THAT IS NOT CONTROLLED WITH YOUR NAUSEA MEDICATION *UNUSUAL SHORTNESS OF BREATH *UNUSUAL BRUISING OR BLEEDING *URINARY PROBLEMS (pain or burning when urinating, or frequent urination) *BOWEL PROBLEMS (unusual diarrhea, constipation, pain near the anus) TENDERNESS IN MOUTH AND THROAT WITH OR WITHOUT PRESENCE OF ULCERS (sore throat, sores in mouth, or a toothache) UNUSUAL RASH, SWELLING OR PAIN  UNUSUAL VAGINAL DISCHARGE OR ITCHING   Items with * indicate a potential emergency and should be followed up as soon as possible or go to the Emergency Department if any problems should occur.  Please show the CHEMOTHERAPY ALERT CARD or IMMUNOTHERAPY ALERT CARD at check-in to the Emergency Department and triage nurse.  Should you have questions after your visit or need to cancel or reschedule your appointment, please contact [facility] CANCER  CTR [place] - A DEPT OF Eligha Bridegroom [facility] [phone]  and follow the prompts.  Office hours are 8:00 a.m. to 4:30 p.m. Monday - Friday. Please note that voicemails left after 4:00 p.m. may not be returned until the following business day.  We are closed weekends and major holidays. You have access to a nurse at all times for urgent questions. Please call the main number to the clinic [phone] and follow the prompts.  For any non-urgent questions, you may also contact your  provider using MyChart. We now offer e-Visits for anyone 18 and older to request care online for non-urgent symptoms. For details visit [email or web address].   Also download the MyChart app! Go to the app store, search "MyChart", open the app, select [facility], and log in with your MyChart username and password.

## 2023-03-24 ENCOUNTER — Encounter: Payer: Self-pay | Admitting: Hematology

## 2023-03-24 ENCOUNTER — Other Ambulatory Visit (HOSPITAL_COMMUNITY): Payer: Self-pay

## 2023-03-24 ENCOUNTER — Encounter (HOSPITAL_COMMUNITY): Payer: Self-pay | Admitting: Hematology

## 2023-03-24 NOTE — Telephone Encounter (Signed)
 Oral Oncology Patient Advocate Encounter  Test claimed Lenalidomide 10 mg capsules and it does not require a PA.  [name], CPhT Oncology Pharmacy Patient Advocate [facility] Direct Number: [phone] Fax: [phone]

## 2023-03-25 ENCOUNTER — Other Ambulatory Visit: Payer: Self-pay | Admitting: Hematology

## 2023-03-29 ENCOUNTER — Other Ambulatory Visit: Payer: Self-pay | Admitting: Hematology

## 2023-03-29 DIAGNOSIS — C9000 Multiple myeloma not having achieved remission: Secondary | ICD-10-CM

## 2023-04-13 ENCOUNTER — Inpatient Hospital Stay: Payer: Medicare Other

## 2023-04-13 ENCOUNTER — Inpatient Hospital Stay: Payer: Medicare Other | Admitting: Hematology

## 2023-04-13 ENCOUNTER — Encounter: Payer: Self-pay | Admitting: Hematology

## 2023-04-13 ENCOUNTER — Inpatient Hospital Stay: Payer: Medicare Other | Attending: Hematology

## 2023-04-13 VITALS — BP 130/72 | HR 51 | Temp 97.0°F | Resp 18

## 2023-04-13 DIAGNOSIS — C9000 Multiple myeloma not having achieved remission: Secondary | ICD-10-CM | POA: Diagnosis present

## 2023-04-13 DIAGNOSIS — Z5112 Encounter for antineoplastic immunotherapy: Secondary | ICD-10-CM | POA: Diagnosis present

## 2023-04-13 LAB — CBC WITH DIFFERENTIAL/PLATELET
Abs Immature Granulocytes: 0.02 10*3/uL (ref 0.00–0.07)
Basophils Absolute: 0 10*3/uL (ref 0.0–0.1)
Basophils Relative: 1 %
Eosinophils Absolute: 0.3 10*3/uL (ref 0.0–0.5)
Eosinophils Relative: 6 %
HCT: 40.7 % (ref 39.0–52.0)
Hemoglobin: 13.5 g/dL (ref 13.0–17.0)
Immature Granulocytes: 1 %
Lymphocytes Relative: 49 %
Lymphs Abs: 2.1 10*3/uL (ref 0.7–4.0)
MCH: 32.5 pg (ref 26.0–34.0)
MCHC: 33.2 g/dL (ref 30.0–36.0)
MCV: 98.1 fL (ref 80.0–100.0)
Monocytes Absolute: 0.4 10*3/uL (ref 0.1–1.0)
Monocytes Relative: 10 %
Neutro Abs: 1.4 10*3/uL — ABNORMAL LOW (ref 1.7–7.7)
Neutrophils Relative %: 33 %
Platelets: 250 10*3/uL (ref 150–400)
RBC: 4.15 MIL/uL — ABNORMAL LOW (ref 4.22–5.81)
RDW: 13.2 % (ref 11.5–15.5)
WBC: 4.2 10*3/uL (ref 4.0–10.5)
nRBC: 0 % (ref 0.0–0.2)

## 2023-04-13 MED ORDER — CETIRIZINE HCL 10 MG PO TABS
10.0000 mg | ORAL_TABLET | Freq: Once | ORAL | Status: AC
  Administered 2023-04-13: 10 mg via ORAL
  Filled 2023-04-13: qty 1

## 2023-04-13 MED ORDER — DARATUMUMAB-HYALURONIDASE-FIHJ 1800-30000 MG-UT/15ML ~~LOC~~ SOLN
1800.0000 mg | Freq: Once | SUBCUTANEOUS | Status: AC
  Administered 2023-04-13: 1800 mg via SUBCUTANEOUS
  Filled 2023-04-13: qty 15

## 2023-04-13 NOTE — Progress Notes (Signed)
Patient tolerated Daratumumab injection with no complaints voiced.  See MAR for details.  Labs reviewed. Injection site clean and dry with no bruising or swelling noted at site.  Band aid applied.  Vss with discharge and left in satisfactory condition with no s/s of distress noted.

## 2023-04-13 NOTE — Progress Notes (Signed)
 Patient presents today for Darzalex Faspro injection. Vital signs and lab work within parameters for treatment. Patient denies any side effects related to his last treatment. MAR reviewed. ANC 1.4. Treatment conditions ANC less than 1000 may treat.

## 2023-04-13 NOTE — Patient Instructions (Signed)
 [facility] [place] - [facility]  Discharge Instructions: Thank you for choosing [facility] to provide your oncology and hematology care.  If you have a lab appointment with the Cancer Center - please note that after April 8th, 2024, all labs will be drawn in the cancer center.  You do not have to check in or register with the main entrance as you have in the past but will complete your check-in in the cancer center.  Wear comfortable clothing and clothing appropriate for easy access to any Portacath or PICC line.   We strive to give you quality time with your provider. You may need to reschedule your appointment if you arrive late (15 or more minutes).  Arriving late affects you and other patients whose appointments are after yours.  Also, if you miss three or more appointments without notifying the office, you may be dismissed from the clinic at the provider's discretion.      For prescription refill requests, have your pharmacy contact our office and allow 72 hours for refills to be completed.    Today you received the following chemotherapy and/or immunotherapy agents daratumumab.       To help prevent nausea and vomiting after your treatment, we encourage you to take your nausea medication as directed.  BELOW ARE SYMPTOMS THAT SHOULD BE REPORTED IMMEDIATELY: *FEVER GREATER THAN 100.4 F (38 C) OR HIGHER *CHILLS OR SWEATING *NAUSEA AND VOMITING THAT IS NOT CONTROLLED WITH YOUR NAUSEA MEDICATION *UNUSUAL SHORTNESS OF BREATH *UNUSUAL BRUISING OR BLEEDING *URINARY PROBLEMS (pain or burning when urinating, or frequent urination) *BOWEL PROBLEMS (unusual diarrhea, constipation, pain near the anus) TENDERNESS IN MOUTH AND THROAT WITH OR WITHOUT PRESENCE OF ULCERS (sore throat, sores in mouth, or a toothache) UNUSUAL RASH, SWELLING OR PAIN  UNUSUAL VAGINAL DISCHARGE OR ITCHING   Items with * indicate a potential emergency and should be  followed up as soon as possible or go to the Emergency Department if any problems should occur.  Please show the CHEMOTHERAPY ALERT CARD or IMMUNOTHERAPY ALERT CARD at check-in to the Emergency Department and triage nurse.  Should you have questions after your visit or need to cancel or reschedule your appointment, please contact [facility] [place] - A DEPT OF Eligha Bridegroom [facility] [phone]  and follow the prompts.  Office hours are 8:00 a.m. to 4:30 p.m. Monday - Friday. Please note that voicemails left after 4:00 p.m. may not be returned until the following business day.  We are closed weekends and major holidays. You have access to a nurse at all times for urgent questions. Please call the main number to the clinic [phone] and follow the prompts.  For any non-urgent questions, you may also contact your provider using MyChart. We now offer e-Visits for anyone 52 and older to request care online for non-urgent symptoms. For details visit [email or web address].   Also download the MyChart app! Go to the app store, search "MyChart", open the app, select , and log in with your MyChart username and password.

## 2023-04-19 ENCOUNTER — Ambulatory Visit: Payer: BLUE CROSS/BLUE SHIELD

## 2023-04-22 ENCOUNTER — Other Ambulatory Visit: Payer: Self-pay | Admitting: Hematology

## 2023-04-22 NOTE — Telephone Encounter (Signed)
 Chart reviewed. Revlimid refilled per last office note with Dr. Ellin Saba.

## 2023-05-10 ENCOUNTER — Other Ambulatory Visit: Payer: Self-pay

## 2023-05-10 DIAGNOSIS — C9000 Multiple myeloma not having achieved remission: Secondary | ICD-10-CM

## 2023-05-11 ENCOUNTER — Inpatient Hospital Stay

## 2023-05-11 ENCOUNTER — Encounter: Payer: Self-pay | Admitting: Hematology

## 2023-05-11 VITALS — BP 149/79 | HR 66 | Temp 97.2°F | Resp 18 | Wt 233.0 lb

## 2023-05-11 DIAGNOSIS — C9000 Multiple myeloma not having achieved remission: Secondary | ICD-10-CM

## 2023-05-11 DIAGNOSIS — Z5112 Encounter for antineoplastic immunotherapy: Secondary | ICD-10-CM | POA: Diagnosis not present

## 2023-05-11 LAB — COMPREHENSIVE METABOLIC PANEL WITH GFR
ALT: 23 U/L (ref 0–44)
AST: 26 U/L (ref 15–41)
Albumin: 3.8 g/dL (ref 3.5–5.0)
Alkaline Phosphatase: 58 U/L (ref 38–126)
Anion gap: 9 (ref 5–15)
BUN: 14 mg/dL (ref 8–23)
CO2: 24 mmol/L (ref 22–32)
Calcium: 9.2 mg/dL (ref 8.9–10.3)
Chloride: 103 mmol/L (ref 98–111)
Creatinine, Ser: 1.1 mg/dL (ref 0.61–1.24)
GFR, Estimated: >60 mL/min (ref >60)
Glucose, Bld: 122 mg/dL — ABNORMAL HIGH (ref 70–99)
Potassium: 3.6 mmol/L (ref 3.5–5.1)
Sodium: 136 mmol/L (ref 135–145)
Total Bilirubin: 0.8 mg/dL (ref 0.0–1.2)
Total Protein: 6.6 g/dL (ref 6.5–8.1)

## 2023-05-11 LAB — CBC WITH DIFFERENTIAL/PLATELET
Abs Immature Granulocytes: 0.03 10*3/uL (ref 0.00–0.07)
Basophils Absolute: 0 10*3/uL (ref 0.0–0.1)
Basophils Relative: 1 %
Eosinophils Absolute: 0.2 10*3/uL (ref 0.0–0.5)
Eosinophils Relative: 5 %
HCT: 41.6 % (ref 39.0–52.0)
Hemoglobin: 13.8 g/dL (ref 13.0–17.0)
Immature Granulocytes: 1 %
Lymphocytes Relative: 53 %
Lymphs Abs: 2.5 10*3/uL (ref 0.7–4.0)
MCH: 32.5 pg (ref 26.0–34.0)
MCHC: 33.2 g/dL (ref 30.0–36.0)
MCV: 97.9 fL (ref 80.0–100.0)
Monocytes Absolute: 0.4 10*3/uL (ref 0.1–1.0)
Monocytes Relative: 9 %
Neutro Abs: 1.4 10*3/uL — ABNORMAL LOW (ref 1.7–7.7)
Neutrophils Relative %: 31 %
Platelets: 237 10*3/uL (ref 150–400)
RBC: 4.25 MIL/uL (ref 4.22–5.81)
RDW: 13.6 % (ref 11.5–15.5)
WBC: 4.6 10*3/uL (ref 4.0–10.5)
nRBC: 0 % (ref 0.0–0.2)

## 2023-05-11 LAB — MAGNESIUM: Magnesium: 1.7 mg/dL (ref 1.7–2.4)

## 2023-05-11 MED ORDER — SODIUM CHLORIDE 0.9 % IV SOLN: Freq: Once | INTRAVENOUS | Status: AC

## 2023-05-11 MED ORDER — ZOLEDRONIC ACID 4 MG/100ML IV SOLN
4.0000 mg | Freq: Once | INTRAVENOUS | Status: AC
  Administered 2023-05-11: 4 mg via INTRAVENOUS
  Filled 2023-05-11: qty 100

## 2023-05-11 MED ORDER — CETIRIZINE HCL 10 MG PO TABS
10.0000 mg | ORAL_TABLET | Freq: Once | ORAL | Status: AC
  Administered 2023-05-11: 10 mg via ORAL
  Filled 2023-05-11: qty 1

## 2023-05-11 MED ORDER — DARATUMUMAB-HYALURONIDASE-FIHJ 1800-30000 MG-UT/15ML ~~LOC~~ SOLN
1800.0000 mg | Freq: Once | SUBCUTANEOUS | Status: AC
  Administered 2023-05-11: 1800 mg via SUBCUTANEOUS
  Filled 2023-05-11: qty 15

## 2023-05-11 NOTE — Patient Instructions (Signed)
 [facility] [place] [facility]  Discharge Instructions: Thank you for choosing [facility] to provide your oncology and hematology care.  If you have a lab appointment with the Cancer Center - please note that after April 8th, 2024, all labs will be drawn in the cancer center.  You do not have to check in or register with the main entrance as you have in the past but will complete your check-in in the cancer center.  Wear comfortable clothing and clothing appropriate for easy access to any Portacath or PICC line.   We strive to give you quality time with your provider. You may need to reschedule your appointment if you arrive late (15 or more minutes).  Arriving late affects you and other patients whose appointments are after yours.  Also, if you miss three or more appointments without notifying the office, you may be dismissed from the clinic at the provider's discretion.      For prescription refill requests, have your pharmacy contact our office and allow 72 hours for refills to be completed.    Today you received the following chemotherapy and/or immunotherapy agents darzalex    To help prevent nausea and vomiting after your treatment, we encourage you to take your nausea medication as directed.  BELOW ARE SYMPTOMS THAT SHOULD BE REPORTED IMMEDIATELY: *FEVER GREATER THAN 100.4 F (38 C) OR HIGHER *CHILLS OR SWEATING *NAUSEA AND VOMITING THAT IS NOT CONTROLLED WITH YOUR NAUSEA MEDICATION *UNUSUAL SHORTNESS OF BREATH *UNUSUAL BRUISING OR BLEEDING *URINARY PROBLEMS (pain or burning when urinating, or frequent urination) *BOWEL PROBLEMS (unusual diarrhea, constipation, pain near the anus) TENDERNESS IN MOUTH AND THROAT WITH OR WITHOUT PRESENCE OF ULCERS (sore throat, sores in mouth, or a toothache) UNUSUAL RASH, SWELLING OR PAIN  UNUSUAL VAGINAL DISCHARGE OR ITCHING   Items with * indicate a potential emergency and should be followed up as  soon as possible or go to the Emergency Department if any problems should occur.  Please show the CHEMOTHERAPY ALERT CARD or IMMUNOTHERAPY ALERT CARD at check-in to the Emergency Department and triage nurse.  Should you have questions after your visit or need to cancel or reschedule your appointment, please contact [facility] [place] - A DEPT OF [facility] [phone]  and follow the prompts.  Office hours are 8:00 a.m. to 4:30 p.m. Monday - Friday. Please note that voicemails left after 4:00 p.m. may not be returned until the following business day.  We are closed weekends and major holidays. You have access to a nurse at all times for urgent questions. Please call the main number to the clinic [phone] and follow the prompts.  For any non-urgent questions, you may also contact your provider using MyChart. We now offer e-Visits for anyone 2 and older to request care online for non-urgent symptoms. For details visit [email or web address].   Also download the MyChart app! Go to the app store, search "MyChart", open the app, select [place], and log in with your MyChart username and password.

## 2023-05-11 NOTE — Progress Notes (Signed)
 Patient tolerated Zometa  therapy with no complaints voiced. Labs reviewed.   Side effects with management reviewed with understanding verbalized.  Peripheral IV site clean and dry with no bruising or swelling noted at site.  Good blood return noted before and after administration of therapy.  Band aid applied.  Patient left in satisfactory condition with VSS and no s/s of distress noted.   Patient tolerated Daratumumab  injection with no complaints voiced.  See MAR for details.  Labs reviewed. Injection site clean and dry with no bruising or swelling noted at site.  Band aid applied.  Vss with discharge and left in satisfactory condition with no s/s of distress noted.

## 2023-05-17 ENCOUNTER — Other Ambulatory Visit: Payer: Self-pay | Admitting: *Deleted

## 2023-05-17 ENCOUNTER — Other Ambulatory Visit: Payer: Self-pay | Admitting: Hematology

## 2023-05-17 MED ORDER — REVLIMID 10 MG PO CAPS: ORAL_CAPSULE | ORAL | 0 refills | Status: DC

## 2023-05-28 ENCOUNTER — Other Ambulatory Visit: Payer: Self-pay

## 2023-05-28 DIAGNOSIS — C9000 Multiple myeloma not having achieved remission: Secondary | ICD-10-CM

## 2023-06-01 ENCOUNTER — Inpatient Hospital Stay: Attending: Hematology

## 2023-06-01 ENCOUNTER — Ambulatory Visit (INDEPENDENT_AMBULATORY_CARE_PROVIDER_SITE_OTHER)

## 2023-06-01 VITALS — Ht 69.0 in | Wt 232.0 lb

## 2023-06-01 DIAGNOSIS — Z5112 Encounter for antineoplastic immunotherapy: Secondary | ICD-10-CM | POA: Insufficient documentation

## 2023-06-01 DIAGNOSIS — R918 Other nonspecific abnormal finding of lung field: Secondary | ICD-10-CM | POA: Insufficient documentation

## 2023-06-01 DIAGNOSIS — M545 Low back pain, unspecified: Secondary | ICD-10-CM | POA: Diagnosis not present

## 2023-06-01 DIAGNOSIS — C9000 Multiple myeloma not having achieved remission: Secondary | ICD-10-CM | POA: Insufficient documentation

## 2023-06-01 DIAGNOSIS — Z7982 Long term (current) use of aspirin: Secondary | ICD-10-CM | POA: Diagnosis not present

## 2023-06-01 DIAGNOSIS — Z0000 Encounter for general adult medical examination without abnormal findings: Secondary | ICD-10-CM | POA: Diagnosis not present

## 2023-06-01 LAB — CBC WITH DIFFERENTIAL/PLATELET
Abs Immature Granulocytes: 0.01 10*3/uL (ref 0.00–0.07)
Basophils Absolute: 0 10*3/uL (ref 0.0–0.1)
Basophils Relative: 1 %
Eosinophils Absolute: 0.1 10*3/uL (ref 0.0–0.5)
Eosinophils Relative: 1 %
HCT: 41.4 % (ref 39.0–52.0)
Hemoglobin: 14.1 g/dL (ref 13.0–17.0)
Immature Granulocytes: 0 %
Lymphocytes Relative: 47 %
Lymphs Abs: 1.8 10*3/uL (ref 0.7–4.0)
MCH: 33 pg (ref 26.0–34.0)
MCHC: 34.1 g/dL (ref 30.0–36.0)
MCV: 97 fL (ref 80.0–100.0)
Monocytes Absolute: 0.5 10*3/uL (ref 0.1–1.0)
Monocytes Relative: 13 %
Neutro Abs: 1.4 10*3/uL — ABNORMAL LOW (ref 1.7–7.7)
Neutrophils Relative %: 38 %
Platelets: 216 10*3/uL (ref 150–400)
RBC: 4.27 MIL/uL (ref 4.22–5.81)
RDW: 13.2 % (ref 11.5–15.5)
WBC: 3.8 10*3/uL — ABNORMAL LOW (ref 4.0–10.5)
nRBC: 0 % (ref 0.0–0.2)

## 2023-06-01 LAB — COMPREHENSIVE METABOLIC PANEL WITH GFR
ALT: 24 U/L (ref 0–44)
AST: 28 U/L (ref 15–41)
Albumin: 3.8 g/dL (ref 3.5–5.0)
Alkaline Phosphatase: 56 U/L (ref 38–126)
Anion gap: 7 (ref 5–15)
BUN: 16 mg/dL (ref 8–23)
CO2: 24 mmol/L (ref 22–32)
Calcium: 8.6 mg/dL — ABNORMAL LOW (ref 8.9–10.3)
Chloride: 103 mmol/L (ref 98–111)
Creatinine, Ser: 1.02 mg/dL (ref 0.61–1.24)
GFR, Estimated: >60 mL/min (ref >60)
Glucose, Bld: 135 mg/dL — ABNORMAL HIGH (ref 70–99)
Potassium: 3.7 mmol/L (ref 3.5–5.1)
Sodium: 134 mmol/L — ABNORMAL LOW (ref 135–145)
Total Bilirubin: 0.8 mg/dL (ref 0.0–1.2)
Total Protein: 6.6 g/dL (ref 6.5–8.1)

## 2023-06-01 LAB — MAGNESIUM: Magnesium: 1.9 mg/dL (ref 1.7–2.4)

## 2023-06-01 NOTE — Progress Notes (Signed)
 Subjective:   Eugene [name] is a 68 y.o. who presents for a Medicare Wellness preventive visit.  As a reminder, Annual Wellness Visits don't include a physical exam, and some assessments may be limited, especially if this visit is performed virtually. We may recommend an in-person follow-up visit with your provider if needed.  Visit Complete: Virtual I connected with  Eugene [name] on 06/01/23 by a video and audio enabled telemedicine application and verified that I am speaking with the correct person using two identifiers.  Patient Location: Home  Provider Location: Home Office  I discussed the limitations of evaluation and management by telemedicine. The patient expressed understanding and agreed to proceed.  Vital Signs: Because this visit was a virtual/telehealth visit, some criteria may be missing or patient reported. Any vitals not documented were not able to be obtained and vitals that have been documented are patient reported.  Persons Participating in Visit: Patient.  AWV Questionnaire: No: Patient Medicare AWV questionnaire was not completed prior to this visit.  Cardiac Risk Factors include: advanced age (>2men, >66 women);dyslipidemia;hypertension;obesity (BMI >30kg/m2);sedentary lifestyle;male gender     Objective:     Today's Vitals   06/01/23 0908  Weight: 232 lb (105.2 kg)  Height: 5\' 9"  (1.753 m)  PainSc: 0-No pain   Body mass index is 34.26 kg/m.     06/01/2023    9:14 AM 03/16/2023   12:58 PM 12/22/2022    3:31 PM 11/24/2022    1:06 PM 10/27/2022    1:54 PM 10/05/2022    8:11 AM 09/29/2022    2:08 PM  Advanced Directives  Does Patient Have a Medical Advance Directive? Yes No No No No No No  Type of Estate agent of [place];Living will        Does patient want to make changes to medical advance directive? No - Patient declined        Copy of Healthcare Power of Attorney in Chart? Yes - validated most recent copy scanned in chart  (See row information)        Would patient like information on creating a medical advance directive?  No - Patient declined No - Patient declined No - Patient declined No - Patient declined No - Patient declined     Current Medications (verified) Outpatient Encounter Medications as of 06/01/2023  Medication Sig   acyclovir  (ZOVIRAX ) 400 MG tablet TAKE 1 TABLET BY MOUTH TWICE DAILY.   aspirin  EC 81 MG tablet Take by mouth.   calcium -vitamin D  (OSCAL WITH D) 500-200 MG-UNIT tablet Take 1 tablet by mouth.   dexamethasone  (DECADRON ) 4 MG tablet TAKE 10 TABLETS BY MOUTH ONCE A WEEK   metoprolol  succinate (TOPROL -XL) 25 MG 24 hr tablet TAKE 1 TABLET BY MOUTH DAILY   Multiple Vitamin (MULTIVITAMIN) tablet Take 1 tablet by mouth daily.     Multiple Vitamins/Iron TABS Take by mouth.   nitroGLYCERIN  (NITROSTAT ) 0.4 MG SL tablet Place 1 tablet (0.4 mg total) under the tongue every 5 (five) minutes for 3 doses as needed for chest pain.   ondansetron  (ZOFRAN ) 8 MG tablet Take 8 mg by mouth every 8 (eight) hours as needed for nausea or vomiting.   prochlorperazine  (COMPAZINE ) 10 MG tablet Take 10 mg by mouth every 8 (eight) hours as needed for nausea or vomiting.   REVLIMID  10 MG capsule TAKE 1 CAPSULE BY MOUTH 1 TIME A DAY FOR 21 DAYS ON THEN 7 DAYS OFF   rosuvastatin  (CRESTOR ) 40 MG tablet Take 1  tablet (40 mg total) by mouth every other day.   traMADol  (ULTRAM ) 50 MG tablet Take 1 tablet (50 mg total) by mouth every 6 (six) hours as needed. (Patient not taking: Reported on 06/01/2023)   No facility-administered encounter medications on file as of 06/01/2023.    Allergies (verified) Patient has no known allergies.   History: Past Medical History:  Diagnosis Date   Back pain    CAD (coronary artery disease)    a. 01/2017 NSTEMI/Cath: LM nl, LAD 25p - ? hypodense but no filling defect, RI nl, LCX nl, RCA nl, EF 55-65%-->Med Rx.   Erectile dysfunction    History of echocardiogram    a. 01/2017 Echo:  EF 55-60%, no rwma.   Hypertension    Past Surgical History:  Procedure Laterality Date   LEFT HEART CATH AND CORONARY ANGIOGRAPHY N/A 01/15/2017   Procedure: LEFT HEART CATH AND CORONARY ANGIOGRAPHY;  Surgeon: [place], Peter M, MD;  Location: [facility] INVASIVE CV LAB;  Service: Cardiovascular;  Laterality: N/A;   ORIF trocanteric femoral IM Nail Right    Family History  Problem Relation Age of Onset   Cancer Mother        breast   Diabetes Mother    Diabetes Sister    Hypertension Sister    Cancer Brother    Mental illness Sister    Social History   Socioeconomic History   Marital status: Significant Other    Spouse name: Not on file   Number of children: Not on file   Years of education: Not on file   Highest education level: Not on file  Occupational History   Not on file  Tobacco Use   Smoking status: Never   Smokeless tobacco: Never  Vaping Use   Vaping status: Never Used  Substance and Sexual Activity   Alcohol use: No   Drug use: No   Sexual activity: Yes  Other Topics Concern   Not on file  Social History Narrative   Not on file   Social Drivers of Health   Financial Resource Strain: Low Risk  (06/01/2023)   Overall Financial Resource Strain (CARDIA)    Difficulty of Paying Living Expenses: Not hard at all  Food Insecurity: No Food Insecurity (06/01/2023)   Hunger Vital Sign    Worried About Running Out of Food in the Last Year: Never true    Ran Out of Food in the Last Year: Never true  Transportation Needs: No Transportation Needs (06/01/2023)   PRAPARE - Administrator, Civil Service (Medical): No    Lack of Transportation (Non-Medical): No  Physical Activity: Inactive (06/01/2023)   Exercise Vital Sign    Days of Exercise per Week: 0 days    Minutes of Exercise per Session: 0 min  Stress: No Stress Concern Present (06/01/2023)   [employer] of Occupational Health - Occupational Stress Questionnaire    Feeling of Stress : Not at all   Social Connections: Moderately Isolated (06/01/2023)   Social Connection and Isolation Panel [NHANES]    Frequency of Communication with Friends and Family: More than three times a week    Frequency of Social Gatherings with Friends and Family: More than three times a week    Attends Religious Services: More than 4 times per year    Active Member of [employer] or Organizations: No    Attends Banker Meetings: Never    Marital Status: Divorced    Tobacco Counseling Counseling given: Yes  Clinical Intake:  Pre-visit preparation completed: Yes  Pain : No/denies pain Pain Score: 0-No pain     BMI - recorded: 34.26 Nutritional Status: BMI > 30  Obese Nutritional Risks: None Diabetes: No  Lab Results  Component Value Date   HGBA1C 4.7 (L) 06/30/2022   HGBA1C 4.9 06/10/2021   HGBA1C 5.4 07/16/2020     How often do you need to have someone help you when you read instructions, pamphlets, or other written materials from your doctor or pharmacy?: 1 - Never  Interpreter Needed?: No  Information entered by :: Sally Crazier CMA   Activities of Daily Living     06/01/2023    9:10 AM  In your present state of health, do you have any difficulty performing the following activities:  Hearing? 0  Vision? 0  Difficulty concentrating or making decisions? 0  Walking or climbing stairs? 0  Dressing or bathing? 0  Doing errands, shopping? 0  Preparing Food and eating ? N  Using the Toilet? N  In the past six months, have you accidently leaked urine? N  Do you have problems with loss of bowel control? N  Managing your Medications? N  Managing your Finances? N  Housekeeping or managing your Housekeeping? N    Patient Care Team: Meldon Sport, MD as PCP - General (Internal Medicine) Vinetta Greening, DO as Consulting Physician (Internal Medicine) Gerhard Knuckles, RN as Oncology Nurse Navigator (Oncology) Paulett Boros, MD as Medical Oncologist (Oncology) Amanda Jungling  Joyceann No, MD as Consulting Physician (Cardiology)  Indicate any recent Medical Services you may have received from other than [facility] providers in the past year (date may be approximate).     Assessment:    This is a routine wellness examination for [place].  Hearing/Vision screen Hearing Screening - Comments:: Patient denies any hearing difficulties.   Vision Screening - Comments:: Patient wears reading glasses only. Up to date with yearly exams.  Patient sees Dr. Lucendia Rusk w/ [facility] [place] office.     Goals Addressed             This Visit's Progress    Patient Stated       Increase my activity and get back to the level of exercise I was at before I started chemo.        Depression Screen     06/01/2023    9:21 AM 12/18/2022    9:59 AM 06/18/2022    3:54 PM 12/11/2021   11:05 AM 05/26/2021    2:15 PM 11/13/2020    9:58 AM 09/18/2020   10:13 AM  PHQ 2/9 Scores  PHQ - 2 Score 0 0 0 0 0 0 0  PHQ- 9 Score 2 0        Patient c/o fatigue but that is due to current chemo treatment Fall Risk     06/01/2023    9:16 AM 12/18/2022    9:59 AM 06/18/2022    3:54 PM 12/11/2021   11:04 AM 05/26/2021    2:14 PM  Fall Risk   Falls in the past year? 0 0 0 0 0  Number falls in past yr: 0 0 0 0 0  Injury with Fall? 0 0 0 0 0  Risk for fall due to : No Fall Risks No Fall Risks  No Fall Risks No Fall Risks  Follow up Falls prevention discussed;Falls evaluation completed Falls evaluation completed  Falls evaluation completed Falls evaluation completed    MEDICARE  RISK AT HOME:  Medicare Risk at Home Any stairs in or around the home?: Yes If so, are there any without handrails?: No Home free of loose throw rugs in walkways, pet beds, electrical cords, etc?: Yes Adequate lighting in your home to reduce risk of falls?: Yes Life alert?: No Use of a cane, walker or w/c?: No Grab bars in the bathroom?: Yes Shower chair or bench in shower?: No Elevated toilet seat or a  handicapped toilet?: Yes  TIMED UP AND GO:  Was the test performed?  No  Cognitive Function: 6CIT completed        06/01/2023    9:20 AM  6CIT Screen  What Year? 0 points  What month? 0 points  What time? 0 points  Count back from 20 0 points  Months in reverse 0 points  Repeat phrase 0 points  Total Score 0 points    Immunizations Immunization History  Administered Date(s) Administered   Fluad Quad(high Dose 65+) 09/23/2022   Influenza,inj,Quad PF,6+ Mos 10/13/2018, 10/15/2020   Moderna Sars-Covid-2 Vaccination 08/14/2019, 09/11/2019   PNEUMOCOCCAL CONJUGATE-20 11/13/2020   Td 03/25/2009   Tdap 12/11/2021    Screening Tests Health Maintenance  Topic Date Due   Zoster Vaccines- Shingrix (1 of 2) Never done   COVID-19 Vaccine (3 - Moderna risk series) 10/09/2019   INFLUENZA VACCINE  08/13/2023   Fecal DNA (Cologuard)  11/29/2023   Medicare Annual Wellness (AWV)  05/31/2024   DTaP/Tdap/Td (3 - Td or Tdap) 12/12/2031   Pneumonia Vaccine 82+ Years old  Completed   Hepatitis C Screening  Completed   HPV VACCINES  Aged Out   Meningococcal B Vaccine  Aged Out   Colonoscopy  Discontinued    Health Maintenance  Health Maintenance Due  Topic Date Due   Zoster Vaccines- Shingrix (1 of 2) Never done   COVID-19 Vaccine (3 - Moderna risk series) 10/09/2019   Health Maintenance Items Addressed: Patient advised of recommended vaccines and verbalized understanding.   Additional Screening:  Vision Screening: Recommended annual ophthalmology exams for early detection of glaucoma and other disorders of the eye.  Dental Screening: Recommended annual dental exams for proper oral hygiene  Community Resource Referral / Chronic Care Management: CRR required this visit?  No   CCM required this visit?  No   Plan:    I have personally reviewed and noted the following in the patient's chart:   Medical and social history Use of alcohol, tobacco or illicit drugs  Current  medications and supplements including opioid prescriptions. Patient is currently taking opioid prescriptions. Information provided to patient regarding non-opioid alternatives. Patient advised to discuss non-opioid treatment plan with their provider. Functional ability and status Nutritional status Physical activity Advanced directives List of other physicians Hospitalizations, surgeries, and ER visits in previous 12 months Vitals Screenings to include cognitive, depression, and falls Referrals and appointments  In addition, I have reviewed and discussed with patient certain preventive protocols, quality metrics, and best practice recommendations. A written personalized care plan for preventive services as well as general preventive health recommendations were provided to patient.   Gao Mitnick, CMA   06/01/2023   After Visit Summary: (MyChart) Due to this being a telephonic visit, the after visit summary with patients personalized plan was offered to patient via MyChart   Notes: Nothing significant to report at this time.

## 2023-06-01 NOTE — Patient Instructions (Signed)
 Eugene [name] , Thank you for taking time out of your busy schedule to complete your Annual Wellness Visit with me. I enjoyed our conversation and look forward to speaking with you again next year. I, as well as your care team,  appreciate your ongoing commitment to your health goals. Please review the following plan we discussed and let me know if I can assist you in the future.  Your Game plan/ To Do List     Referrals: If you haven't heard from the office you've been referred to, please reach out to them at the phone number provided.  N/a this visit  Follow up Visits:  Next Medicare AWV with our clinical staff: Jun 06, 2024 at 8:40 video visit    Have you seen your provider in the last 6 months (3 months if uncontrolled diabetes)? yes  Next Office Visit with your provider: June 18, 2023 at 10:00 in office with Dr. Lydia [name]  Clinician Recommendations:    Aim for 30 minutes of exercise or brisk walking, 6-8 glasses of water, and 5 servings of fruits and vegetables each day.   I enjoyed our conversation today and look forward to talking with you again next year!! Have a wonderful and safe year. All the best, Eugene [name]      This is a list of the screening recommended for you and due dates:  Health Maintenance  Topic Date Due   Zoster (Shingles) Vaccine (1 of 2) Never done   COVID-19 Vaccine (3 - Moderna risk series) 10/09/2019   Flu Shot  08/13/2023   Cologuard (Stool DNA test)  11/29/2023   Medicare Annual Wellness Visit  05/31/2024   DTaP/Tdap/Td vaccine (3 - Td or Tdap) 12/12/2031   Pneumonia Vaccine  Completed   Hepatitis C Screening  Completed   HPV Vaccine  Aged Out   Meningitis B Vaccine  Aged Out   Colon Cancer Screening  Discontinued    Advanced directives: (Copy Requested) Please bring a copy of your health care power of attorney and living will to the office to be added to your chart at your convenience. You can mail to [facility] [address]  [place], [place] [address] or email to [email or web address] Advance Care Planning is important because it:  [x]  Makes sure you receive the medical care that is consistent with your values, goals, and preferences  [x]  It provides guidance to your family and loved ones and reduces their decisional burden about whether or not they are making the right decisions based on your wishes.  Follow the link provided in your after visit summary or read over the paperwork we have mailed to you to help you started getting your Advance Directives in place. If you need assistance in completing these, please reach out to us  so that we can help you!  See attachments for Preventive Care and Fall Prevention Tips.   Managing Pain Without Opioids  Opioids are strong medicines used to treat moderate to severe pain. For some people, especially those who have long-term (chronic) pain, opioids may not be the best choice for pain management due to: Side effects like nausea, constipation, and sleepiness. The risk of addiction (opioid use disorder). The longer you take opioids, the greater your risk of addiction. Pain that lasts for more than 3 months is called chronic pain. Managing chronic pain usually requires more than one approach and is often provided by a team of health care providers working together (multidisciplinary approach). Pain management may  be done at a pain management center or pain clinic. How to manage pain without the use of opioids Use non-opioid medicines Non-opioid medicines for pain may include: Over-the-counter or prescription non-steroidal anti-inflammatory drugs (NSAIDs). These may be the first medicines used for pain. They work well for muscle and bone pain, and they reduce swelling. Acetaminophen . This over-the-counter medicine may work well for milder pain but not swelling. Antidepressants. These may be used to treat chronic pain. A certain type of antidepressant (tricyclics) is often used.  These medicines are given in lower doses for pain than when used for depression. Anticonvulsants. These are usually used to treat seizures but may also reduce nerve (neuropathic) pain. Muscle relaxants. These relieve pain caused by sudden muscle tightening (spasms). You may also use a pain medicine that is applied to the skin as a patch, cream, or gel (topical analgesic), such as a numbing medicine. These may cause fewer side effects than medicines taken by mouth. Do certain therapies as directed Some therapies can help with pain management. They include: Physical therapy. You will do exercises to gain strength and flexibility. A physical therapist may teach you exercises to move and stretch parts of your body that are weak, stiff, or painful. You can learn these exercises at physical therapy visits and practice them at home. Physical therapy may also involve: Massage. Heat wraps or applying heat or cold to affected areas. Electrical signals that interrupt pain signals (transcutaneous electrical nerve stimulation, TENS). Weak lasers that reduce pain and swelling (low-level laser therapy). Signals from your body that help you learn to regulate pain (biofeedback). Occupational therapy. This helps you to learn ways to function at home and work with less pain. Recreational therapy. This involves trying new activities or hobbies, such as a physical activity or drawing. Mental health therapy, including: Cognitive behavioral therapy (CBT). This helps you learn coping skills for dealing with pain. Acceptance and commitment therapy (ACT) to change the way you think and react to pain. Relaxation therapies, including muscle relaxation exercises and mindfulness-based stress reduction. Pain management counseling. This may be individual, family, or group counseling.  Receive medical treatments Medical treatments for pain management include: Nerve block injections. These may include a pain blocker and  anti-inflammatory medicines. You may have injections: Near the spine to relieve chronic back or neck pain. Into joints to relieve back or joint pain. Into nerve areas that supply a painful area to relieve body pain. Into muscles (trigger point injections) to relieve some painful muscle conditions. A medical device placed near your spine to help block pain signals and relieve nerve pain or chronic back pain (spinal cord stimulation device). Acupuncture. Follow these instructions at home Medicines Take over-the-counter and prescription medicines only as told by your health care provider. If you are taking pain medicine, ask your health care providers about possible side effects to watch out for. Do not drive or use heavy machinery while taking prescription opioid pain medicine. Lifestyle  Do not use drugs or alcohol to reduce pain. If you drink alcohol, limit how much you have to: 0-1 drink a day for women who are not pregnant. 0-2 drinks a day for men. Know how much alcohol is in a drink. In the [place] one drink equals one 12 oz bottle of beer (355 mL), one 5 oz glass of wine (148 mL), or one 1 oz glass of hard liquor (44 mL). Do not use any products that contain nicotine or tobacco. These products include cigarettes, chewing tobacco, and vaping  devices, such as e-cigarettes. If you need help quitting, ask your health care provider. Eat a healthy diet and maintain a healthy weight. Poor diet and excess weight may make pain worse. Eat foods that are high in fiber. These include fresh fruits and vegetables, whole grains, and beans. Limit foods that are high in fat and processed sugars, such as fried and sweet foods. Exercise regularly. Exercise lowers stress and may help relieve pain. Ask your health care provider what activities and exercises are safe for you. If your health care provider approves, join an exercise class that combines movement and stress reduction. Examples include yoga and tai  chi. Get enough sleep. Lack of sleep may make pain worse. Lower stress as much as possible. Practice stress reduction techniques as told by your therapist. General instructions Work with all your pain management providers to find the treatments that work best for you. You are an important member of your pain management team. There are many things you can do to reduce pain on your own. Consider joining an online or in-person support group for people who have chronic pain. Keep all follow-up visits. This is important. Where to find more information You can find more information about managing pain without opioids from: American Academy of Pain Medicine: painmed.org Institute for Chronic Pain: instituteforchronicpain.org American Chronic Pain Association: theacpa.org Contact a health care provider if: You have side effects from pain medicine. Your pain gets worse or does not get better with treatments or home therapy. You are struggling with anxiety or depression. Summary Many types of pain can be managed without opioids. Chronic pain may respond better to pain management without opioids. Pain is best managed when you and a team of health care providers work together. Pain management without opioids may include non-opioid medicines, medical treatments, physical therapy, mental health therapy, and lifestyle changes. Tell your health care providers if your pain gets worse or is not being managed well enough. This information is not intended to replace advice given to you by your health care provider. Make sure you discuss any questions you have with your health care provider. Document Revised: 04/10/2020 Document Reviewed: 04/10/2020 Elsevier Patient Education  2023 [employer]. Understanding Your Risk for Falls Millions of people have serious injuries from falls each year. It is important to understand your risk of falling. Talk with your health care provider about your risk and what you can do  to lower it. If you do have a serious fall, make sure to tell your provider. Falling once raises your risk of falling again. How can falls affect me? Serious injuries from falls are common. These include: Broken bones, such as hip fractures. Head injuries, such as traumatic brain injuries (TBI) or concussions. A fear of falling can cause you to avoid activities and stay at home. This can make your muscles weaker and raise your risk for a fall. What can increase my risk? There are a number of risk factors that increase your risk for falling. The more risk factors you have, the higher your risk of falling. Serious injuries from a fall happen most often to people who are older than 66 years old. Teenagers and young adults ages 77-29 are also at higher risk. Common risk factors include: Weakness in the lower body. Being generally weak or confused due to long-term (chronic) illness. Dizziness or balance problems. Poor vision. Medicines that cause dizziness or drowsiness. These may include: Medicines for your blood pressure, heart, anxiety, insomnia, or swelling (edema). Pain medicines.  Muscle relaxants. Other risk factors include: Drinking alcohol. Having had a fall in the past. Having foot pain or wearing improper footwear. Working at a dangerous job. Having any of the following in your home: Tripping hazards, such as floor clutter or loose rugs. Poor lighting. Pets. Having dementia or memory loss. What actions can I take to lower my risk of falling?     Physical activity Stay physically fit. Do strength and balance exercises. Consider taking a regular class to build strength and balance. Yoga and tai chi are good options. Vision Have your eyes checked every year and your prescription for glasses or contacts updated as needed. Shoes and walking aids Wear non-skid shoes. Wear shoes that have rubber soles and low heels. Do not wear high heels. Do not walk around the house in socks  or slippers. Use a cane or walker as told by your provider. Home safety Attach secure railings on both sides of your stairs. Install grab bars for your bathtub, shower, and toilet. Use a non-skid mat in your bathtub or shower. Attach bath mats securely with double-sided, non-slip rug tape. Use good lighting in all rooms. Keep a flashlight near your bed. Make sure there is a clear path from your bed to the bathroom. Use night-lights. Do not use throw rugs. Make sure all carpeting is taped or tacked down securely. Remove all clutter from walkways and stairways, including extension cords. Repair uneven or broken steps and floors. Avoid walking on icy or slippery surfaces. Walk on the grass instead of on icy or slick sidewalks. Use ice melter to get rid of ice on walkways in the winter. Use a cordless phone. Questions to ask your health care provider Can you help me check my risk for a fall? Do any of my medicines make me more likely to fall? Should I take a vitamin D  supplement? What exercises can I do to improve my strength and balance? Should I make an appointment to have my vision checked? Do I need a bone density test to check for weak bones (osteoporosis)? Would it help to use a cane or a walker? Where to find more information Centers for Disease Control and Prevention, [employer]: TonerPromos.no Community-Based Fall Prevention Programs: TonerPromos.no [employer] on Aging: BaseRingTones.pl Contact a health care provider if: You fall at home. You are afraid of falling at home. You feel weak, drowsy, or dizzy. This information is not intended to replace advice given to you by your health care provider. Make sure you discuss any questions you have with your health care provider. Document Revised: 09/01/2021 Document Reviewed: 09/01/2021 Elsevier Patient Education  2024 [employer].

## 2023-06-02 ENCOUNTER — Other Ambulatory Visit: Payer: Self-pay

## 2023-06-02 LAB — KAPPA/LAMBDA LIGHT CHAINS
Kappa free light chain: 14.6 mg/L (ref 3.3–19.4)
Kappa, lambda light chain ratio: 1.6 (ref 0.26–1.65)
Lambda free light chains: 9.1 mg/L (ref 5.7–26.3)

## 2023-06-03 LAB — PROTEIN ELECTROPHORESIS, SERUM
A/G Ratio: 1.3 (ref 0.7–1.7)
Albumin ELP: 3.4 g/dL (ref 2.9–4.4)
Alpha-1-Globulin: 0.3 g/dL (ref 0.0–0.4)
Alpha-2-Globulin: 0.7 g/dL (ref 0.4–1.0)
Beta Globulin: 1 g/dL (ref 0.7–1.3)
Gamma Globulin: 0.8 g/dL (ref 0.4–1.8)
Globulin, Total: 2.7 g/dL (ref 2.2–3.9)
Total Protein ELP: 6.1 g/dL (ref 6.0–8.5)

## 2023-06-03 LAB — IMMUNOFIXATION ELECTROPHORESIS
IgA: 90 mg/dL (ref 61–437)
IgG (Immunoglobin G), Serum: 829 mg/dL (ref 603–1613)
IgM (Immunoglobulin M), Srm: 25 mg/dL (ref 20–172)
Total Protein ELP: 6.3 g/dL (ref 6.0–8.5)

## 2023-06-08 ENCOUNTER — Inpatient Hospital Stay

## 2023-06-08 ENCOUNTER — Inpatient Hospital Stay (HOSPITAL_BASED_OUTPATIENT_CLINIC_OR_DEPARTMENT_OTHER): Admitting: Hematology

## 2023-06-08 VITALS — BP 146/75 | HR 57 | Temp 97.8°F | Resp 18 | Wt 235.0 lb

## 2023-06-08 DIAGNOSIS — Z5112 Encounter for antineoplastic immunotherapy: Secondary | ICD-10-CM | POA: Diagnosis not present

## 2023-06-08 DIAGNOSIS — C9000 Multiple myeloma not having achieved remission: Secondary | ICD-10-CM

## 2023-06-08 MED ORDER — DARATUMUMAB-HYALURONIDASE-FIHJ 1800-30000 MG-UT/15ML ~~LOC~~ SOLN
1800.0000 mg | Freq: Once | SUBCUTANEOUS | Status: AC
  Administered 2023-06-08: 1800 mg via SUBCUTANEOUS
  Filled 2023-06-08: qty 15

## 2023-06-08 MED ORDER — CETIRIZINE HCL 10 MG PO TABS
10.0000 mg | ORAL_TABLET | Freq: Once | ORAL | Status: AC
  Administered 2023-06-08: 10 mg via ORAL
  Filled 2023-06-08: qty 1

## 2023-06-08 NOTE — Progress Notes (Signed)
 [facility] [address] [place] [address]    Clinic Day:  06/08/2023  Referring physician: Meldon Sport, MD  Patient Care Team: Meldon Sport, MD as PCP - General (Internal Medicine) Vinetta Greening, DO as Consulting Physician (Internal Medicine) Gerhard Knuckles, RN as Oncology Nurse Navigator (Oncology) Paulett Boros, MD as Medical Oncologist (Oncology) Amanda Jungling, Joyceann No, MD as Consulting Physician (Cardiology) Lucendia Rusk, DO as Consulting Physician (Optometry) Pleasant Brilliant, MD as Attending Physician (Orthopedic Surgery)   ASSESSMENT & PLAN:   Assessment: 1.  Standard risk plasma cell myeloma: - He reported low back pain radiating to the right thigh since April, pain gets worse when he walks for more than 2 blocks. - He had history of MGUS and was last seen in our clinic in 2019.  He had an abnormal M spike of 3.2 g on 12/06/2018. - Skeletal survey on 10/22/2017 showed no suspicious lytic lesions. - X-ray of the right sided pelvis on 06/11/2020 showed prominent lytic lesions noted in the proximal midportion of the right femoral diaphysis with no evidence of fracture or dislocation. - Bone marrow biopsy on 07/09/2020 with hypercellular marrow with trilineage hematopoiesis.  Plasma cells representing 10% of cells. - Chromosome analysis 46, XY (20).  Multiple myeloma FISH panel negative. - PET scan on 07/04/2020 with multiple hypermetabolic bony soft tissue lesions.  Dominant lesions in the L5 vertebral body and posterior right acetabulum and distal right femur.  Focal increased uptake in the right tonsillar region with associated soft tissue fullness on CT imaging. - 24-hour urine with total protein 134. - Beta-2  microglobulin 2.1 (06/26/2020), LDH 136. - Dara RVD cycle 1 started on 09/03/2020. - He had stem cell collection at [facility]  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 - Maintenance daratumumab  monthly and Revlimid  10 mg 3  weeks on/1 week off started on 04/08/2021. - PET scan (07/03/2021): Lytic soft tissue lesion in the right L5 vertebral body measures 3.7 x 2.8 cm SUV 2.9, previously 3.4 x 3.4 cm, SUV 6.6.  Soft tissue lesion in the posterior right acetabulum measures 2.2 cm SUV 2.5, previously 2.2 cm with SUV 9.  Hypermetabolism in the left tonsil SUV 5.9.  Direct oropharyngeal exam did not show any mass.  New mildly hypermetabolic left lower pulmonary nodules nonspecific.  2.  Social/family history: - He works at [employer] in environmental services. - He was never smoker. - 2 brothers died of metastatic lung cancer.  Sister has thyroid  cancer.  Mother had breast cancer.    Plan: 1.  Stage I standard risk IgG kappa multiple myeloma: - He is tolerating Revlimid  and monthly Darzalex  very well.  No infections reported.  No new onset pains. - Labs from 06/01/2023: Normal LFTs.  CBC grossly normal with mild leukopenia and neutropenia.  M spike is negative.  FLC ratio is normal.  Immunofixation was unremarkable. - He will receive the last dose of Darzalex  injection today.  He has been on Darzalex  maintenance for slightly over 2 years.  As there is no evidence of recurrence, I will discontinue it after today.  He will continue Revlimid  10 mg 3 weeks on/1 week off until progression.  RTC 12 weeks for follow-up with repeat myeloma labs.  2.  Low back pain: - He does not report any back pain at this time.  This has completely resolved.  3.  Infection prophylaxis: - He will continue acyclovir  for another month and stop it.  Continue aspirin  81 mg daily.  4.  Myeloma bone disease: - He is receiving Zometa  since July 2023, changed to every 12 weeks in May 2024.  He does not have any side effects from it.  He will continue Zometa  every 12 weeks for 2 to 3 years if no relapse.    Orders Placed This Encounter  Procedures   CBC with Differential    Standing Status:   Future    Expected Date:   06/08/2023     Expiration Date:   06/07/2024      Nadeen Augusta Teague,acting as a scribe for Paulett Boros, MD.,have documented all relevant documentation on the behalf of Paulett Boros, MD,as directed by  Paulett Boros, MD while in the presence of Paulett Boros, MD.  I, Paulett Boros MD, have reviewed the above documentation for accuracy and completeness, and I agree with the above.      Paulett Boros, MD   5/27/202512:05 PM  CHIEF COMPLAINT:   Diagnosis: multiple myeloma    Cancer Staging  Multiple myeloma without remission (HCC) Staging form: Plasma Cell Myeloma and Plasma Cell Disorders, AJCC 8th Edition - Clinical stage from 07/24/2020: RISS Stage I (Beta-2 -microglobulin (mg/L): 2.1, Albumin (g/dL): 3.7, ISS: Stage I, High-risk cytogenetics: Absent, LDH: Normal) - Signed by Almeda Jacobs, MD on 07/24/2020    Prior Therapy: DaraVRd 09/03/20 - 02/26/21  Current Therapy:  maintenance Revlimid /daratumumab    HISTORY OF PRESENT ILLNESS:   Oncology History  Multiple myeloma without remission (HCC)  07/09/2020 Pathology Results   BONE MARROW, ASPIRATE, CLOT, CORE:  -Hypercellular bone marrow with plasma cell neoplasm  -See comment   PERIPHERAL BLOOD:  -Slight macrocytic anemia   COMMENT:   The bone marrow is hypercellular for age with trilineage hematopoiesis and nonspecific myeloid changes.  In this background, the plasma cells are increased in number representing 10% of all cells associated with atypical cytomorphologic features.  The plasma cells display kappa light chain restriction consistent with plasma cell neoplasm.  Correlation with cytogenetic and FISH studies is recommended.    07/24/2020 Initial Diagnosis   Multiple myeloma without remission (HCC)   07/24/2020 Cancer Staging   Staging form: Plasma Cell Myeloma and Plasma Cell Disorders, AJCC 8th Edition - Clinical stage from 07/24/2020: RISS Stage I (Beta-2 -microglobulin (mg/L): 2.1, Albumin  (g/dL): 3.7, ISS: Stage I, High-risk cytogenetics: Absent, LDH: Normal) - Signed by Almeda Jacobs, MD on 07/24/2020 Stage prefix: Initial diagnosis Beta 2 microglobulin range (mg/L): Less than 3.5 Albumin range (g/dL): Greater than or equal to 3.5 Cytogenetics: No abnormalities   09/03/2020 - 09/02/2021 Chemotherapy   Patient is on Treatment Plan : MYELOMA NEWLY DIAGNOSED TRANSPLANT CANDIDATE DaraVRd (Daratumumab  SQ) q21d x 6 Cycles (Induction/Consolidation)     04/16/2021 -  Chemotherapy   Patient is on Treatment Plan : MYELOMA Daratumumab  SQ q28d        INTERVAL HISTORY:   Eugene [name] is a 68 y.o. male presenting to clinic today for follow up of multiple myeloma. He was last seen by me on 03/16/23.  Today, he states that he is doing well overall. His appetite level is at 100%. His energy level is at 75%. Khush notes he saw Dr. Broadus Canes on 04/20/23 and states his right femoral lytic lesion is unchanged.   He is taking Revlimid , acyclovir , and aspirin  as prescribed. He denies any dental issues and has a dental cleaning scheduled tomorrow.   PAST MEDICAL HISTORY:   Past Medical History: Past Medical History:  Diagnosis Date   Back pain  CAD (coronary artery disease)    a. 01/2017 NSTEMI/Cath: LM nl, LAD 25p - ? hypodense but no filling defect, RI nl, LCX nl, RCA nl, EF 55-65%-->Med Rx.   Erectile dysfunction    History of echocardiogram    a. 01/2017 Echo: EF 55-60%, no rwma.   Hypertension     Surgical History: Past Surgical History:  Procedure Laterality Date   LEFT HEART CATH AND CORONARY ANGIOGRAPHY N/A 01/15/2017   Procedure: LEFT HEART CATH AND CORONARY ANGIOGRAPHY;  Surgeon: [place], Peter M, MD;  Location: [facility] INVASIVE CV LAB;  Service: Cardiovascular;  Laterality: N/A;   ORIF trocanteric femoral IM Nail Right     Social History: Social History   Socioeconomic History   Marital status: Significant Other    Spouse name: Not on file   Number of children: Not on file   Years of  education: Not on file   Highest education level: Not on file  Occupational History   Not on file  Tobacco Use   Smoking status: Never   Smokeless tobacco: Never  Vaping Use   Vaping status: Never Used  Substance and Sexual Activity   Alcohol use: No   Drug use: No   Sexual activity: Yes  Other Topics Concern   Not on file  Social History Narrative   Not on file   Social Drivers of Health   Financial Resource Strain: Low Risk  (06/01/2023)   Overall Financial Resource Strain (CARDIA)    Difficulty of Paying Living Expenses: Not hard at all  Food Insecurity: No Food Insecurity (06/01/2023)   Hunger Vital Sign    Worried About Running Out of Food in the Last Year: Never true    Ran Out of Food in the Last Year: Never true  Transportation Needs: No Transportation Needs (06/01/2023)   PRAPARE - Administrator, Civil Service (Medical): No    Lack of Transportation (Non-Medical): No  Physical Activity: Inactive (06/01/2023)   Exercise Vital Sign    Days of Exercise per Week: 0 days    Minutes of Exercise per Session: 0 min  Stress: No Stress Concern Present (06/01/2023)   [employer] of Occupational Health - Occupational Stress Questionnaire    Feeling of Stress : Not at all  Social Connections: Moderately Isolated (06/01/2023)   Social Connection and Isolation Panel [NHANES]    Frequency of Communication with Friends and Family: More than three times a week    Frequency of Social Gatherings with Friends and Family: More than three times a week    Attends Religious Services: More than 4 times per year    Active Member of [employer] or Organizations: No    Attends Banker Meetings: Never    Marital Status: Divorced  Catering manager Violence: Not At Risk (06/01/2023)   Humiliation, Afraid, Rape, and Kick questionnaire    Fear of Current or Ex-Partner: No    Emotionally Abused: No    Physically Abused: No    Sexually Abused: No    Family  History: Family History  Problem Relation Age of Onset   Cancer Mother        breast   Diabetes Mother    Diabetes Sister    Hypertension Sister    Cancer Brother    Mental illness Sister     Current Medications:  Current Outpatient Medications:    acyclovir  (ZOVIRAX ) 400 MG tablet, TAKE 1 TABLET BY MOUTH TWICE DAILY., Disp: 60 tablet, Rfl: 5  aspirin  EC 81 MG tablet, Take by mouth., Disp: , Rfl:    calcium -vitamin D  (OSCAL WITH D) 500-200 MG-UNIT tablet, Take 1 tablet by mouth., Disp: , Rfl:    dexamethasone  (DECADRON ) 4 MG tablet, TAKE 10 TABLETS BY MOUTH ONCE A WEEK, Disp: 40 tablet, Rfl: 6   metoprolol  succinate (TOPROL -XL) 25 MG 24 hr tablet, TAKE 1 TABLET BY MOUTH DAILY, Disp: 90 tablet, Rfl: 3   Multiple Vitamin (MULTIVITAMIN) tablet, Take 1 tablet by mouth daily.  , Disp: , Rfl:    Multiple Vitamins/Iron TABS, Take by mouth., Disp: , Rfl:    nitroGLYCERIN  (NITROSTAT ) 0.4 MG SL tablet, Place 1 tablet (0.4 mg total) under the tongue every 5 (five) minutes for 3 doses as needed for chest pain., Disp: 25 tablet, Rfl: 3   ondansetron  (ZOFRAN ) 8 MG tablet, Take 8 mg by mouth every 8 (eight) hours as needed for nausea or vomiting., Disp: , Rfl:    prochlorperazine  (COMPAZINE ) 10 MG tablet, Take 10 mg by mouth every 8 (eight) hours as needed for nausea or vomiting., Disp: , Rfl:    REVLIMID  10 MG capsule, TAKE 1 CAPSULE BY MOUTH 1 TIME A DAY FOR 21 DAYS ON THEN 7 DAYS OFF, Disp: 21 capsule, Rfl: 0   rosuvastatin  (CRESTOR ) 40 MG tablet, Take 1 tablet (40 mg total) by mouth every other day., Disp: 90 tablet, Rfl: 1   traMADol  (ULTRAM ) 50 MG tablet, Take 1 tablet (50 mg total) by mouth every 6 (six) hours as needed., Disp: 60 tablet, Rfl: 0   Allergies: No Known Allergies  REVIEW OF SYSTEMS:   Review of Systems  Constitutional:  Negative for chills, fatigue and fever.  HENT:   Negative for lump/mass, mouth sores, nosebleeds, sore throat and trouble swallowing.   Eyes:  Negative for  eye problems.  Respiratory:  Negative for cough and shortness of breath.   Cardiovascular:  Negative for chest pain, leg swelling and palpitations.  Gastrointestinal:  Negative for abdominal pain, constipation, diarrhea, nausea and vomiting.  Genitourinary:  Negative for bladder incontinence, difficulty urinating, dysuria, frequency, hematuria and nocturia.   Musculoskeletal:  Negative for arthralgias, back pain, flank pain, myalgias and neck pain.  Skin:  Negative for itching and rash.  Neurological:  Negative for dizziness, headaches and numbness.  Hematological:  Does not bruise/bleed easily.  Psychiatric/Behavioral:  Negative for depression, sleep disturbance and suicidal ideas. The patient is not nervous/anxious.   All other systems reviewed and are negative.    VITALS:   Blood pressure (!) 146/75, pulse (!) 57, temperature 97.8 F (36.6 C), temperature source Tympanic, resp. rate 18, weight 235 lb (106.6 kg), SpO2 100%.  Wt Readings from Last 3 Encounters:  06/08/23 235 lb (106.6 kg)  06/01/23 232 lb (105.2 kg)  05/11/23 233 lb (105.7 kg)    Body mass index is 34.7 kg/m.  Performance status (ECOG): 1 - Symptomatic but completely ambulatory  PHYSICAL EXAM:   Physical Exam Vitals and nursing note reviewed. Exam conducted with a chaperone present.  Constitutional:      Appearance: Normal appearance.  Cardiovascular:     Rate and Rhythm: Normal rate and regular rhythm.     Pulses: Normal pulses.     Heart sounds: Normal heart sounds.  Pulmonary:     Effort: Pulmonary effort is normal.     Breath sounds: Normal breath sounds.  Abdominal:     Palpations: Abdomen is soft. There is no hepatomegaly, splenomegaly or mass.     Tenderness:  There is no abdominal tenderness.  Musculoskeletal:     Right lower leg: No edema.     Left lower leg: No edema.  Lymphadenopathy:     Cervical: No cervical adenopathy.     Right cervical: No superficial, deep or posterior cervical  adenopathy.    Left cervical: No superficial, deep or posterior cervical adenopathy.     Upper Body:     Right upper body: No supraclavicular or axillary adenopathy.     Left upper body: No supraclavicular or axillary adenopathy.  Neurological:     General: No focal deficit present.     Mental Status: He is alert and oriented to person, place, and time.  Psychiatric:        Mood and Affect: Mood normal.        Behavior: Behavior normal.     LABS:      Latest Ref Rng & Units 06/01/2023   11:04 AM 05/11/2023   10:56 AM 04/13/2023    1:08 PM  CBC  WBC 4.0 - 10.5 K/uL 3.8  4.6  4.2   Hemoglobin 13.0 - 17.0 g/dL 54.0  98.1  19.1   Hematocrit 39.0 - 52.0 % 41.4  41.6  40.7   Platelets 150 - 400 K/uL 216  237  250       Latest Ref Rng & Units 06/01/2023   11:04 AM 05/11/2023   10:56 AM 01/19/2023   12:25 PM  CMP  Glucose 70 - 99 mg/dL 478  295  621   BUN 8 - 23 mg/dL 16  14  15    Creatinine 0.61 - 1.24 mg/dL 3.08  6.57  8.46   Sodium 135 - 145 mmol/L 134  136  137   Potassium 3.5 - 5.1 mmol/L 3.7  3.6  3.8   Chloride 98 - 111 mmol/L 103  103  104   CO2 22 - 32 mmol/L 24  24  28    Calcium  8.9 - 10.3 mg/dL 8.6  9.2  8.9   Total Protein 6.5 - 8.1 g/dL 6.6  6.6  6.6   Total Bilirubin 0.0 - 1.2 mg/dL 0.8  0.8  0.7   Alkaline Phos 38 - 126 U/L 56  58  58   AST 15 - 41 U/L 28  26  33   ALT 0 - 44 U/L 24  23  27       No results found for: "CEA1", "CEA" / No results found for: "CEA1", "CEA" Lab Results  Component Value Date   PSA1 0.5 07/16/2020   No results found for: "NGE952" No results found for: "CAN125"  Lab Results  Component Value Date   TOTALPROTELP 6.1 06/01/2023   TOTALPROTELP 6.3 06/01/2023   ALBUMINELP 3.4 06/01/2023   A1GS 0.3 06/01/2023   A2GS 0.7 06/01/2023   BETS 1.0 06/01/2023   BETA2SER 0.3 12/07/2018   GAMS 0.8 06/01/2023   MSPIKE Not Observed 06/01/2023   SPEI Comment 06/01/2023   Lab Results  Component Value Date   FERRITIN 210 10/05/2017   Lab  Results  Component Value Date   LDH 108 07/07/2022   LDH 116 06/09/2022   LDH 106 03/10/2022     STUDIES:   No results found.

## 2023-06-08 NOTE — Patient Instructions (Signed)

## 2023-06-08 NOTE — Progress Notes (Signed)
 Ok to treat per MD today. Labs reviwed with MD. ANC 1.4 MD aware

## 2023-06-08 NOTE — Progress Notes (Signed)
Patient is taking Revlimid as prescribed.  He has not missed any doses and reports no side effects at this time.    Patient has been examined by Dr. Katragadda. Vital signs and labs have been reviewed by MD - ANC, Creatinine, LFTs, hemoglobin, and platelets are within treatment parameters per M.D. - pt may proceed with treatment.  Primary RN and pharmacy notified.  

## 2023-06-08 NOTE — Progress Notes (Signed)
 Patient tolerated Daratumumab  injection with no complaints voiced.  See MAR for details.  Labs reviewed. Injection site clean and dry with no bruising or swelling noted at site.  Band aid applied.  Vss with discharge and left in satisfactory condition with no s/s of distress noted. All follow ups as scheduled.   Eugene [name]

## 2023-06-08 NOTE — Patient Instructions (Signed)
 [facility] [place] [facility]  Discharge Instructions: Thank you for choosing [facility] to provide your oncology and hematology care.  If you have a lab appointment with the Cancer Center - please note that after April 8th, 2024, all labs will be drawn in the cancer center.  You do not have to check in or register with the main entrance as you have in the past but will complete your check-in in the cancer center.  Wear comfortable clothing and clothing appropriate for easy access to any Portacath or PICC line.   We strive to give you quality time with your provider. You may need to reschedule your appointment if you arrive late (15 or more minutes).  Arriving late affects you and other patients whose appointments are after yours.  Also, if you miss three or more appointments without notifying the office, you may be dismissed from the clinic at the provider's discretion.      For prescription refill requests, have your pharmacy contact our office and allow 72 hours for refills to be completed.    Today you received the following chemotherapy and/or immunotherapy agents darzalex    To help prevent nausea and vomiting after your treatment, we encourage you to take your nausea medication as directed.  BELOW ARE SYMPTOMS THAT SHOULD BE REPORTED IMMEDIATELY: *FEVER GREATER THAN 100.4 F (38 C) OR HIGHER *CHILLS OR SWEATING *NAUSEA AND VOMITING THAT IS NOT CONTROLLED WITH YOUR NAUSEA MEDICATION *UNUSUAL SHORTNESS OF BREATH *UNUSUAL BRUISING OR BLEEDING *URINARY PROBLEMS (pain or burning when urinating, or frequent urination) *BOWEL PROBLEMS (unusual diarrhea, constipation, pain near the anus) TENDERNESS IN MOUTH AND THROAT WITH OR WITHOUT PRESENCE OF ULCERS (sore throat, sores in mouth, or a toothache) UNUSUAL RASH, SWELLING OR PAIN  UNUSUAL VAGINAL DISCHARGE OR ITCHING   Items with * indicate a potential emergency and should be followed up as  soon as possible or go to the Emergency Department if any problems should occur.  Please show the CHEMOTHERAPY ALERT CARD or IMMUNOTHERAPY ALERT CARD at check-in to the Emergency Department and triage nurse.  Should you have questions after your visit or need to cancel or reschedule your appointment, please contact [facility] [place] - A DEPT OF [facility] [phone]  and follow the prompts.  Office hours are 8:00 a.m. to 4:30 p.m. Monday - Friday. Please note that voicemails left after 4:00 p.m. may not be returned until the following business day.  We are closed weekends and major holidays. You have access to a nurse at all times for urgent questions. Please call the main number to the clinic [phone] and follow the prompts.  For any non-urgent questions, you may also contact your provider using MyChart. We now offer e-Visits for anyone 2 and older to request care online for non-urgent symptoms. For details visit [email or web address].   Also download the MyChart app! Go to the app store, search "MyChart", open the app, select [place], and log in with your MyChart username and password.

## 2023-06-14 ENCOUNTER — Other Ambulatory Visit: Payer: Self-pay | Admitting: Hematology

## 2023-06-14 ENCOUNTER — Other Ambulatory Visit: Payer: Self-pay | Admitting: *Deleted

## 2023-06-14 MED ORDER — REVLIMID 10 MG PO CAPS: ORAL_CAPSULE | ORAL | 0 refills | Status: DC

## 2023-06-22 ENCOUNTER — Encounter: Payer: Self-pay | Admitting: Internal Medicine

## 2023-06-22 ENCOUNTER — Ambulatory Visit (INDEPENDENT_AMBULATORY_CARE_PROVIDER_SITE_OTHER): Payer: BLUE CROSS/BLUE SHIELD | Admitting: Internal Medicine

## 2023-06-22 VITALS — BP 132/60 | HR 75 | Ht 69.0 in | Wt 234.8 lb

## 2023-06-22 DIAGNOSIS — I2510 Atherosclerotic heart disease of native coronary artery without angina pectoris: Secondary | ICD-10-CM

## 2023-06-22 DIAGNOSIS — I10 Essential (primary) hypertension: Secondary | ICD-10-CM

## 2023-06-22 DIAGNOSIS — C9000 Multiple myeloma not having achieved remission: Secondary | ICD-10-CM | POA: Diagnosis not present

## 2023-06-22 DIAGNOSIS — E782 Mixed hyperlipidemia: Secondary | ICD-10-CM

## 2023-06-22 NOTE — Assessment & Plan Note (Signed)
 BP Readings from Last 1 Encounters:  06/22/23 132/60   Overall well-controlled with Metoprolol  25 mg QD Counseled for compliance with the medications Advised DASH diet and moderate exercise/walking as tolerated

## 2023-06-22 NOTE — Assessment & Plan Note (Signed)
Has had cardiac cath in the past, no stents On Aspirin, statin and B-blocker Counseled again to stay compliant to statin therapy F/u with Cardiology 

## 2023-06-22 NOTE — Patient Instructions (Signed)
 Please continue to take medications as prescribed.  Please continue to follow low salt diet and perform moderate exercise/walking at least 150 mins/week.  Please get fasting blood tests done before the next visit.

## 2023-06-22 NOTE — Assessment & Plan Note (Signed)
 Needs to take Crestor, at least QOD Reviewed last lipid profile

## 2023-06-22 NOTE — Progress Notes (Signed)
 Established Patient Office Visit  Subjective:  Patient ID: Eugene [name], male    DOB: [birth year]  Age: 68 y.o. MRN: [id number]  CC:  Chief Complaint  Patient presents with   Medical Management of Chronic Issues    6 month f/u    HPI Eugene [name] is a 68 y.o. male with past medical history of CAD, HTN, multiple myeloma and obesity who presents for f/u of his chronic medical conditions.  HTN: BP is well-controlled. Takes medications regularly. Patient denies headache, dizziness, chest pain, dyspnea or palpitations.  CAD: Takes Aspirin  and Metoprolol . Does not take Crestor  regularly, advised to take it at least QOD.   MM: Followed by Oncology, undergoing chemotherapy. Has had stem cell collection at [facility].  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  Past Medical History:  Diagnosis Date   Back pain    CAD (coronary artery disease)    a. 01/2017 NSTEMI/Cath: LM nl, LAD 25p - ? hypodense but no filling defect, RI nl, LCX nl, RCA nl, EF 55-65%-->Med Rx.   Erectile dysfunction    History of echocardiogram    a. 01/2017 Echo: EF 55-60%, no rwma.   Hypertension     Past Surgical History:  Procedure Laterality Date   LEFT HEART CATH AND CORONARY ANGIOGRAPHY N/A 01/15/2017   Procedure: LEFT HEART CATH AND CORONARY ANGIOGRAPHY;  Surgeon: Swaziland, Peter M, MD;  Location: [facility] INVASIVE CV LAB;  Service: Cardiovascular;  Laterality: N/A;   ORIF trocanteric femoral IM Nail Right     Family History  Problem Relation Age of Onset   Cancer Mother        breast   Diabetes Mother    Diabetes Sister    Hypertension Sister    Cancer Brother    Mental illness Sister     Social History   Socioeconomic History   Marital status: Significant Other    Spouse name: Not on file   Number of children: Not on file   Years of education: Not on file   Highest education level: Not on file  Occupational History   Not on file  Tobacco Use   Smoking  status: Never   Smokeless tobacco: Never  Vaping Use   Vaping status: Never Used  Substance and Sexual Activity   Alcohol use: No   Drug use: No   Sexual activity: Yes  Other Topics Concern   Not on file  Social History Narrative   Not on file   Social Drivers of Health   Financial Resource Strain: Low Risk  (06/01/2023)   Overall Financial Resource Strain (CARDIA)    Difficulty of Paying Living Expenses: Not hard at all  Food Insecurity: No Food Insecurity (06/01/2023)   Hunger Vital Sign    Worried About Running Out of Food in the Last Year: Never true    Ran Out of Food in the Last Year: Never true  Transportation Needs: No Transportation Needs (06/01/2023)   PRAPARE - Administrator, Civil Service (Medical): No    Lack of Transportation (Non-Medical): No  Physical Activity: Inactive (06/01/2023)   Exercise Vital Sign    Days of Exercise per Week: 0 days    Minutes of Exercise per Session: 0 min  Stress: No Stress Concern Present (06/01/2023)   [employer] of Occupational Health - Occupational Stress Questionnaire    Feeling of Stress : Not at all  Social Connections: Moderately Isolated (06/01/2023)   Social  Connection and Isolation Panel [NHANES]    Frequency of Communication with Friends and Family: More than three times a week    Frequency of Social Gatherings with Friends and Family: More than three times a week    Attends Religious Services: More than 4 times per year    Active Member of [employer] or Organizations: No    Attends Banker Meetings: Never    Marital Status: Divorced  Catering manager Violence: Not At Risk (06/01/2023)   Humiliation, Afraid, Rape, and Kick questionnaire    Fear of Current or Ex-Partner: No    Emotionally Abused: No    Physically Abused: No    Sexually Abused: No    Outpatient Medications Prior to Visit  Medication Sig Dispense Refill   acyclovir  (ZOVIRAX ) 400 MG tablet TAKE 1 TABLET BY MOUTH TWICE DAILY. 60  tablet 5   aspirin  EC 81 MG tablet Take by mouth.     calcium -vitamin D  (OSCAL WITH D) 500-200 MG-UNIT tablet Take 1 tablet by mouth.     dexamethasone  (DECADRON ) 4 MG tablet TAKE 10 TABLETS BY MOUTH ONCE A WEEK 40 tablet 6   lenalidomide  (REVLIMID ) 10 MG capsule TAKE 1 CAPSULE BY MOUTH 1 TIME A DAY FOR 21 DAYS ON THEN 7 DAYS OFF 21 capsule 0   metoprolol  succinate (TOPROL -XL) 25 MG 24 hr tablet TAKE 1 TABLET BY MOUTH DAILY 90 tablet 3   Multiple Vitamin (MULTIVITAMIN) tablet Take 1 tablet by mouth daily.       Multiple Vitamins/Iron TABS Take by mouth.     nitroGLYCERIN  (NITROSTAT ) 0.4 MG SL tablet Place 1 tablet (0.4 mg total) under the tongue every 5 (five) minutes for 3 doses as needed for chest pain. 25 tablet 3   ondansetron  (ZOFRAN ) 8 MG tablet Take 8 mg by mouth every 8 (eight) hours as needed for nausea or vomiting.     prochlorperazine  (COMPAZINE ) 10 MG tablet Take 10 mg by mouth every 8 (eight) hours as needed for nausea or vomiting.     rosuvastatin  (CRESTOR ) 40 MG tablet Take 1 tablet (40 mg total) by mouth every other day. 90 tablet 1   traMADol  (ULTRAM ) 50 MG tablet Take 1 tablet (50 mg total) by mouth every 6 (six) hours as needed. 60 tablet 0   No facility-administered medications prior to visit.    No Known Allergies  ROS Review of Systems  Constitutional:  Positive for fatigue. Negative for chills and fever.  HENT:  Negative for congestion and sore throat.   Eyes:  Negative for pain and discharge.  Respiratory:  Negative for cough and shortness of breath.   Cardiovascular:  Negative for chest pain and palpitations.  Gastrointestinal:  Negative for diarrhea, nausea and vomiting.  Endocrine: Negative for polydipsia and polyuria.  Genitourinary:  Negative for dysuria and hematuria.  Musculoskeletal:  Positive for arthralgias. Negative for neck pain and neck stiffness.  Skin:  Negative for rash.  Neurological:  Negative for dizziness, weakness, numbness and headaches.   Psychiatric/Behavioral:  Negative for agitation and behavioral problems.       Objective:    Physical Exam Vitals reviewed.  Constitutional:      General: He is not in acute distress.    Appearance: He is obese. He is not diaphoretic.  HENT:     Head: Normocephalic and atraumatic.     Nose: Nose normal.     Mouth/Throat:     Mouth: Mucous membranes are moist.  Eyes:  General: No scleral icterus.    Extraocular Movements: Extraocular movements intact.  Cardiovascular:     Rate and Rhythm: Normal rate and regular rhythm.     Heart sounds: Normal heart sounds. No murmur heard. Pulmonary:     Breath sounds: Normal breath sounds. No wheezing or rales.  Musculoskeletal:     Cervical back: Neck supple. No tenderness.     Right lower leg: No edema.     Left lower leg: No edema.  Skin:    General: Skin is warm.     Findings: No rash.  Neurological:     General: No focal deficit present.     Mental Status: He is alert and oriented to person, place, and time.     Sensory: No sensory deficit.     Motor: No weakness.  Psychiatric:        Mood and Affect: Mood normal.        Behavior: Behavior normal.     BP 132/60 (BP Location: Left Arm)   Pulse 75   Ht 5\' 9"  (1.753 m)   Wt 234 lb 12.8 oz (106.5 kg)   SpO2 99%   BMI 34.67 kg/m  Wt Readings from Last 3 Encounters:  06/22/23 234 lb 12.8 oz (106.5 kg)  06/08/23 235 lb (106.6 kg)  06/01/23 232 lb (105.2 kg)    Lab Results  Component Value Date   TSH 3.892 06/30/2022   Lab Results  Component Value Date   WBC 3.8 (L) 06/01/2023   HGB 14.1 06/01/2023   HCT 41.4 06/01/2023   MCV 97.0 06/01/2023   PLT 216 06/01/2023   Lab Results  Component Value Date   NA 134 (L) 06/01/2023   K 3.7 06/01/2023   CO2 24 06/01/2023   GLUCOSE 135 (H) 06/01/2023   BUN 16 06/01/2023   CREATININE 1.02 06/01/2023   BILITOT 0.8 06/01/2023   ALKPHOS 56 06/01/2023   AST 28 06/01/2023   ALT 24 06/01/2023   PROT 6.6 06/01/2023    ALBUMIN 3.8 06/01/2023   CALCIUM  8.6 (L) 06/01/2023   ANIONGAP 7 06/01/2023   Lab Results  Component Value Date   CHOL 150 06/30/2022   Lab Results  Component Value Date   HDL 51 06/30/2022   Lab Results  Component Value Date   LDLCALC 87 06/30/2022   Lab Results  Component Value Date   TRIG 62 06/30/2022   Lab Results  Component Value Date   CHOLHDL 2.9 06/30/2022   Lab Results  Component Value Date   HGBA1C 4.7 (L) 06/30/2022      Assessment & Plan:   Problem List Items Addressed This Visit       Cardiovascular and Mediastinum   CAD (coronary artery disease)   Has had cardiac cath in the past, no stents On Aspirin , statin and B-blocker Counseled again to stay compliant to statin therapy F/u with Cardiology      Essential hypertension - Primary   BP Readings from Last 1 Encounters:  06/22/23 132/60   Overall well-controlled with Metoprolol  25 mg QD Counseled for compliance with the medications Advised DASH diet and moderate exercise/walking as tolerated      Relevant Orders   TSH     Other   Multiple myeloma without remission (HCC)   Undergoing chemotherapy for MM S/p stem cell collection at [facility] Followed by Oncology      Mixed hyperlipidemia   Needs to take Crestor , at least QOD Reviewed last lipid profile  Relevant Orders   Lipid Profile    No orders of the defined types were placed in this encounter.   Follow-up: Return in about 6 months (around 12/22/2023) for HTN and CAD.    Meldon Sport, MD

## 2023-06-22 NOTE — Assessment & Plan Note (Signed)
Undergoing chemotherapy for MM ?S/p stem cell collection at [facility] ?Followed by Oncology ?

## 2023-07-14 ENCOUNTER — Other Ambulatory Visit: Payer: Self-pay | Admitting: Hematology

## 2023-07-15 ENCOUNTER — Encounter: Payer: Self-pay | Admitting: Hematology

## 2023-07-15 ENCOUNTER — Encounter (HOSPITAL_COMMUNITY): Payer: Self-pay | Admitting: Hematology

## 2023-07-15 ENCOUNTER — Other Ambulatory Visit: Payer: Self-pay | Admitting: *Deleted

## 2023-07-15 MED ORDER — LENALIDOMIDE 10 MG PO CAPS: ORAL_CAPSULE | ORAL | 0 refills | Status: DC

## 2023-07-26 ENCOUNTER — Other Ambulatory Visit: Payer: Self-pay

## 2023-07-26 DIAGNOSIS — C9000 Multiple myeloma not having achieved remission: Secondary | ICD-10-CM

## 2023-07-27 ENCOUNTER — Inpatient Hospital Stay: Attending: Hematology

## 2023-07-27 ENCOUNTER — Other Ambulatory Visit (HOSPITAL_COMMUNITY)
Admission: RE | Admit: 2023-07-27 | Discharge: 2023-07-27 | Disposition: A | Discharge status: Home / Self Care | Source: Ambulatory Visit | Attending: Internal Medicine | Admitting: Internal Medicine

## 2023-07-27 DIAGNOSIS — E782 Mixed hyperlipidemia: Secondary | ICD-10-CM | POA: Insufficient documentation

## 2023-07-27 DIAGNOSIS — Z79899 Other long term (current) drug therapy: Secondary | ICD-10-CM | POA: Diagnosis not present

## 2023-07-27 DIAGNOSIS — C9000 Multiple myeloma not having achieved remission: Secondary | ICD-10-CM

## 2023-07-27 DIAGNOSIS — C9001 Multiple myeloma in remission: Secondary | ICD-10-CM | POA: Diagnosis present

## 2023-07-27 DIAGNOSIS — I10 Essential (primary) hypertension: Secondary | ICD-10-CM | POA: Insufficient documentation

## 2023-07-27 DIAGNOSIS — Z79620 Long term (current) use of immunosuppressive biologic: Secondary | ICD-10-CM | POA: Diagnosis not present

## 2023-07-27 DIAGNOSIS — C7951 Secondary malignant neoplasm of bone: Secondary | ICD-10-CM | POA: Diagnosis present

## 2023-07-27 LAB — CBC WITH DIFFERENTIAL/PLATELET
Abs Immature Granulocytes: 0.01 K/uL (ref 0.00–0.07)
Basophils Absolute: 0 K/uL (ref 0.0–0.1)
Basophils Relative: 1 %
Eosinophils Absolute: 0.1 K/uL (ref 0.0–0.5)
Eosinophils Relative: 2 %
HCT: 42.6 % (ref 39.0–52.0)
Hemoglobin: 13.9 g/dL (ref 13.0–17.0)
Immature Granulocytes: 0 %
Lymphocytes Relative: 57 %
Lymphs Abs: 2.6 K/uL (ref 0.7–4.0)
MCH: 32 pg (ref 26.0–34.0)
MCHC: 32.6 g/dL (ref 30.0–36.0)
MCV: 97.9 fL (ref 80.0–100.0)
Monocytes Absolute: 0.7 K/uL (ref 0.1–1.0)
Monocytes Relative: 15 %
Neutro Abs: 1.1 K/uL — ABNORMAL LOW (ref 1.7–7.7)
Neutrophils Relative %: 25 %
Platelets: 195 K/uL (ref 150–400)
RBC: 4.35 MIL/uL (ref 4.22–5.81)
RDW: 12.9 % (ref 11.5–15.5)
WBC: 4.6 K/uL (ref 4.0–10.5)
nRBC: 0 % (ref 0.0–0.2)

## 2023-07-27 LAB — COMPREHENSIVE METABOLIC PANEL WITH GFR
ALT: 24 U/L (ref 0–44)
AST: 26 U/L (ref 15–41)
Albumin: 3.8 g/dL (ref 3.5–5.0)
Alkaline Phosphatase: 50 U/L (ref 38–126)
Anion gap: 11 (ref 5–15)
BUN: 16 mg/dL (ref 8–23)
CO2: 25 mmol/L (ref 22–32)
Calcium: 8.9 mg/dL (ref 8.9–10.3)
Chloride: 104 mmol/L (ref 98–111)
Creatinine, Ser: 1.06 mg/dL (ref 0.61–1.24)
GFR, Estimated: >60 mL/min (ref >60)
Glucose, Bld: 104 mg/dL — ABNORMAL HIGH (ref 70–99)
Potassium: 3.7 mmol/L (ref 3.5–5.1)
Sodium: 140 mmol/L (ref 135–145)
Total Bilirubin: 0.8 mg/dL (ref 0.0–1.2)
Total Protein: 6.6 g/dL (ref 6.5–8.1)

## 2023-07-27 LAB — TSH: TSH: 3.309 u[IU]/mL (ref 0.350–4.500)

## 2023-07-27 LAB — MAGNESIUM: Magnesium: 1.9 mg/dL (ref 1.7–2.4)

## 2023-07-27 LAB — LIPID PANEL
Cholesterol: 135 mg/dL (ref 0–200)
HDL: 54 mg/dL (ref >40)
LDL Cholesterol: 69 mg/dL (ref 0–99)
Total CHOL/HDL Ratio: 2.5 ratio
Triglycerides: 59 mg/dL (ref <150)
VLDL: 12 mg/dL (ref 0–40)

## 2023-07-28 LAB — KAPPA/LAMBDA LIGHT CHAINS
Kappa free light chain: 18.2 mg/L (ref 3.3–19.4)
Kappa, lambda light chain ratio: 1.63 (ref 0.26–1.65)
Lambda free light chains: 11.2 mg/L (ref 5.7–26.3)

## 2023-07-29 ENCOUNTER — Encounter: Payer: Self-pay | Admitting: Hematology

## 2023-07-29 ENCOUNTER — Encounter (HOSPITAL_COMMUNITY): Payer: Self-pay | Admitting: Hematology

## 2023-07-29 LAB — PROTEIN ELECTROPHORESIS, SERUM
A/G Ratio: 1.4 (ref 0.7–1.7)
Albumin ELP: 3.7 g/dL (ref 2.9–4.4)
Alpha-1-Globulin: 0.2 g/dL (ref 0.0–0.4)
Alpha-2-Globulin: 0.6 g/dL (ref 0.4–1.0)
Beta Globulin: 1 g/dL (ref 0.7–1.3)
Gamma Globulin: 0.8 g/dL (ref 0.4–1.8)
Globulin, Total: 2.6 g/dL (ref 2.2–3.9)
Total Protein ELP: 6.3 g/dL (ref 6.0–8.5)

## 2023-07-29 NOTE — Progress Notes (Signed)
 Erroneous encounter

## 2023-08-01 LAB — IMMUNOFIXATION ELECTROPHORESIS
IgA: 86 mg/dL (ref 61–437)
IgG (Immunoglobin G), Serum: 871 mg/dL (ref 603–1613)
IgM (Immunoglobulin M), Srm: 28 mg/dL (ref 20–172)
Total Protein ELP: 6.4 g/dL (ref 6.0–8.5)

## 2023-08-02 ENCOUNTER — Encounter: Payer: Self-pay | Admitting: Hematology

## 2023-08-03 ENCOUNTER — Inpatient Hospital Stay (HOSPITAL_BASED_OUTPATIENT_CLINIC_OR_DEPARTMENT_OTHER): Admitting: Oncology

## 2023-08-03 ENCOUNTER — Inpatient Hospital Stay

## 2023-08-03 ENCOUNTER — Encounter: Payer: Self-pay | Admitting: Oncology

## 2023-08-03 VITALS — BP 145/79 | HR 56 | Temp 97.9°F | Resp 18 | Wt 235.9 lb

## 2023-08-03 DIAGNOSIS — C9001 Multiple myeloma in remission: Secondary | ICD-10-CM | POA: Diagnosis not present

## 2023-08-03 DIAGNOSIS — C7951 Secondary malignant neoplasm of bone: Secondary | ICD-10-CM | POA: Diagnosis not present

## 2023-08-03 DIAGNOSIS — C9000 Multiple myeloma not having achieved remission: Secondary | ICD-10-CM | POA: Insufficient documentation

## 2023-08-03 MED ORDER — ZOLEDRONIC ACID 4 MG/100ML IV SOLN
4.0000 mg | Freq: Once | INTRAVENOUS | Status: AC
  Administered 2023-08-03: 4 mg via INTRAVENOUS

## 2023-08-03 MED ORDER — SODIUM CHLORIDE 0.9 % IV SOLN: Freq: Once | INTRAVENOUS | Status: AC

## 2023-08-03 NOTE — Patient Instructions (Signed)
 [facility] [place] - A DEPT OF [facility]  Discharge Instructions: Thank you for choosing [facility] to provide your oncology and hematology care.  If you have a lab appointment with the Cancer Center - please note that after April 8th, 2024, all labs will be drawn in the cancer center.  You do not have to check in or register with the main entrance as you have in the past but will complete your check-in in the cancer center.  Wear comfortable clothing and clothing appropriate for easy access to any Portacath or PICC line.   We strive to give you quality time with your provider. You may need to reschedule your appointment if you arrive late (15 or more minutes).  Arriving late affects you and other patients whose appointments are after yours.  Also, if you miss three or more appointments without notifying the office, you may be dismissed from the clinic at the provider's discretion.      For prescription refill requests, have your pharmacy contact our office and allow 72 hours for refills to be completed.    Today you received Zometa 4mg  IV      BELOW ARE SYMPTOMS THAT SHOULD BE REPORTED IMMEDIATELY: *FEVER GREATER THAN 100.4 F (38 C) OR HIGHER *CHILLS OR SWEATING *NAUSEA AND VOMITING THAT IS NOT CONTROLLED WITH YOUR NAUSEA MEDICATION *UNUSUAL SHORTNESS OF BREATH *UNUSUAL BRUISING OR BLEEDING *URINARY PROBLEMS (pain or burning when urinating, or frequent urination) *BOWEL PROBLEMS (unusual diarrhea, constipation, pain near the anus) TENDERNESS IN MOUTH AND THROAT WITH OR WITHOUT PRESENCE OF ULCERS (sore throat, sores in mouth, or a toothache) UNUSUAL RASH, SWELLING OR PAIN  UNUSUAL VAGINAL DISCHARGE OR ITCHING   Items with * indicate a potential emergency and should be followed up as soon as possible or go to the Emergency Department if any problems should occur.  Please show the CHEMOTHERAPY ALERT CARD or IMMUNOTHERAPY ALERT CARD at check-in to  the Emergency Department and triage nurse.  Should you have questions after your visit or need to cancel or reschedule your appointment, please contact [facility] [place] - A DEPT OF [facility] [phone]  and follow the prompts.  Office hours are 8:00 a.m. to 4:30 p.m. Monday - Friday. Please note that voicemails left after 4:00 p.m. may not be returned until the following business day.  We are closed weekends and major holidays. You have access to a nurse at all times for urgent questions. Please call the main number to the clinic [phone] and follow the prompts.  For any non-urgent questions, you may also contact your provider using MyChart. We now offer e-Visits for anyone 66 and older to request care online for non-urgent symptoms. For details visit [email or web address].   Also download the MyChart app! Go to the app store, search "MyChart", open the app, select , and log in with your MyChart username and password.

## 2023-08-03 NOTE — Progress Notes (Signed)
 [facility] at [facility]  FOLLOW-UP progress notes  Patient Care Team: Tobie Suzzane POUR, MD as PCP - General (Internal Medicine) Cindie Carlin POUR, DO as Consulting Physician (Internal Medicine) Celestia Joesph SQUIBB, RN as Oncology Nurse Navigator (Oncology) Rogers Hai, MD as Medical Oncologist (Oncology) Alvan, Dorn FALCON, MD as Consulting Physician (Cardiology) Darroll Anes, DO as Consulting Physician (Optometry) Brenna Lin, MD as Attending Physician (Orthopedic Surgery)  CHIEF COMPLAINTS/PURPOSE OF VISIT:  Multiple myeloma in remission  HISTORY OF PRESENTING ILLNESS:  Eugene [name] 68 y.o. male was transferred to my care after his prior physician has left.  I reviewed the patient's records extensive and collaborated the history with the patient.   Summary of his history is as follows:  - 04/2017: Reported low back pain radiating to right thigh - 10/22/2017: Skeletal survey: No suspicious lytic lesions - 12/06/2018: Abnormal M spike of 3.2 g -06/11/2020: X-ray of right sided pelvis showed prominent lytic lesions noted in the proximal and midportion of the right femoral diaphysis with no evidence of fracture or dislocation - 07/09/2020: Bone marrow biopsy showed hypercellular marrow with trilineage hematopoiesis.  Plasma cells representing 10% of cells  -Chromosome analysis: 46 XY - Multiple myeloma FISH: Negative - 07/04/2020: PET scan: Multiple hypermetabolic bony soft tissue lesions. Dominant lesions in the L5 vertebral body and posterior right acetabulum and distal right femur.  Focal increased uptake in the right tonsillar region with associated soft tissue fullness on CT imaging.  - Labs: 24-hour urine protein: 134, beta-2  microglobulin: 2.1, LDH: 136 - 09/03/2020: Started on Dara VRD - 09/2020: Stem cell collection at [facility].  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  Soft tissue lesion in the posterior right acetabulum measures 2.2 cm SUV 2.5, previously 2.2 cm with SUV 9.  Hypermetabolism in the left tonsil SUV 5.9.  Direct oropharyngeal exam did not show any mass.  New mildly hypermetabolic left lower pulmonary nodules nonspecific.  -07/2021: Started Zometa  every 6 weeks -05/2022: Changed Zometa  to every 12 weeks - 06/08/2023: Last dose of daratumumab .  Completed 2 years of treatment.  Continuing Revlimid  10 mg daily 3 weeks on /1 week off.  Current treatment: Revlimid  10 mg daily for 3 weeks followed by 1 week break  INTERVAL HISTORY:   Patient has no complaints today.  Tolerating Revlimid  very well.  Reports no bone pain.  Denies fever, chills, nausea, vomiting, abdominal pain, weight loss, loss of appetite.  MEDICAL HISTORY:  Past Medical History:  Diagnosis Date   Back pain    CAD (coronary artery disease)    a. 01/2017 NSTEMI/Cath: LM nl, LAD 25p - ? hypodense but no filling defect, RI nl, LCX nl, RCA nl, EF 55-65%-->Med Rx.   Erectile dysfunction    History of echocardiogram    a. 01/2017 Echo: EF 55-60%, no rwma.   Hypertension     SURGICAL HISTORY: Past Surgical History:  Procedure Laterality Date   LEFT HEART CATH AND CORONARY ANGIOGRAPHY N/A 01/15/2017   Procedure: LEFT HEART CATH AND CORONARY ANGIOGRAPHY;  Surgeon: Swaziland, Peter M, MD;  Location: [facility] INVASIVE CV LAB;  Service: Cardiovascular;  Laterality: N/A;   ORIF trocanteric femoral IM Nail Right     SOCIAL HISTORY: Social History   Socioeconomic  History   Marital status: Significant Other    Spouse name: Not on file   Number of children: Not on file   Years of education: Not on file   Highest education level: Not on file  Occupational History   Not on file  Tobacco Use   Smoking status: Never   Smokeless tobacco: Never   Vaping Use   Vaping status: Never Used  Substance and Sexual Activity   Alcohol use: No   Drug use: No   Sexual activity: Yes  Other Topics Concern   Not on file  Social History Narrative   Not on file   Social Drivers of Health   Financial Resource Strain: Low Risk  (06/01/2023)   Overall Financial Resource Strain (CARDIA)    Difficulty of Paying Living Expenses: Not hard at all  Food Insecurity: No Food Insecurity (06/01/2023)   Hunger Vital Sign    Worried About Running Out of Food in the Last Year: Never true    Ran Out of Food in the Last Year: Never true  Transportation Needs: No Transportation Needs (06/01/2023)   PRAPARE - Administrator, Civil Service (Medical): No    Lack of Transportation (Non-Medical): No  Physical Activity: Inactive (06/01/2023)   Exercise Vital Sign    Days of Exercise per Week: 0 days    Minutes of Exercise per Session: 0 min  Stress: No Stress Concern Present (06/01/2023)   [employer] of Occupational Health - Occupational Stress Questionnaire    Feeling of Stress : Not at all  Social Connections: Moderately Isolated (06/01/2023)   Social Connection and Isolation Panel    Frequency of Communication with Friends and Family: More than three times a week    Frequency of Social Gatherings with Friends and Family: More than three times a week    Attends Religious Services: More than 4 times per year    Active Member of [employer] or Organizations: No    Attends Banker Meetings: Never    Marital Status: Divorced  Catering manager Violence: Not At Risk (06/01/2023)   Humiliation, Afraid, Rape, and Kick questionnaire    Fear of Current or Ex-Partner: No    Emotionally Abused: No    Physically Abused: No    Sexually Abused: No    FAMILY HISTORY: Family History  Problem Relation Age of Onset   Cancer Mother        breast   Diabetes Mother    Diabetes Sister    Hypertension Sister    Cancer Brother    Mental illness  Sister     ALLERGIES:  has no known allergies.  MEDICATIONS:  Current Outpatient Medications  Medication Sig Dispense Refill   acyclovir  (ZOVIRAX ) 400 MG tablet TAKE 1 TABLET BY MOUTH TWICE DAILY. 60 tablet 5   aspirin  EC 81 MG tablet Take by mouth.     calcium -vitamin D  (OSCAL WITH D) 500-200 MG-UNIT tablet Take 1 tablet by mouth.     dexamethasone  (DECADRON ) 4 MG tablet TAKE 10 TABLETS BY MOUTH ONCE A WEEK 40 tablet 6   lenalidomide  (REVLIMID ) 10 MG capsule TAKE 1 CAPSULE BY MOUTH 1 TIME A DAY FOR 21 DAYS ON THEN 7 DAYS OFF 21 capsule 0   metoprolol  succinate (TOPROL -XL) 25 MG 24 hr tablet TAKE 1 TABLET BY MOUTH DAILY 90 tablet 3   Multiple Vitamin (MULTIVITAMIN) tablet Take 1 tablet by mouth daily.       Multiple Vitamins/Iron TABS Take by mouth.  nitroGLYCERIN  (NITROSTAT ) 0.4 MG SL tablet Place 1 tablet (0.4 mg total) under the tongue every 5 (five) minutes for 3 doses as needed for chest pain. 25 tablet 3   ondansetron  (ZOFRAN ) 8 MG tablet Take 8 mg by mouth every 8 (eight) hours as needed for nausea or vomiting.     prochlorperazine  (COMPAZINE ) 10 MG tablet Take 10 mg by mouth every 8 (eight) hours as needed for nausea or vomiting.     rosuvastatin  (CRESTOR ) 40 MG tablet Take 1 tablet (40 mg total) by mouth every other day. 90 tablet 1   traMADol  (ULTRAM ) 50 MG tablet Take 1 tablet (50 mg total) by mouth every 6 (six) hours as needed. 60 tablet 0   No current facility-administered medications for this visit.    REVIEW OF SYSTEMS:   Constitutional: Denies fevers, chills or abnormal night sweats Eyes: Denies blurriness of vision, double vision or watery eyes Ears, nose, mouth, throat, and face: Denies mucositis or sore throat Respiratory: Denies cough, dyspnea or wheezes Cardiovascular: Denies palpitation, chest discomfort or lower extremity swelling Gastrointestinal:  Denies nausea, heartburn or change in bowel habits Skin: Denies abnormal skin rashes Lymphatics: Denies new  lymphadenopathy or easy bruising Neurological:Denies numbness, tingling or new weaknesses Behavioral/Psych: Mood is stable, no new changes  All other systems were reviewed with the patient and are negative.  PHYSICAL EXAMINATION: ECOG PERFORMANCE STATUS: 0 - Asymptomatic  Vitals:   08/03/23 1132 08/03/23 1139  BP: (!) 150/91 (!) 145/79  Pulse: (!) 56   Resp: 18   Temp: 97.9 F (36.6 C)   SpO2: 99%    Filed Weights   08/03/23 1132  Weight: 235 lb 14.3 oz (107 kg)    GENERAL:alert, no distress and comfortable SKIN: skin color, texture, turgor are normal, no rashes or significant lesions LUNGS: clear to auscultation and percussion with normal breathing effort HEART: regular rate & rhythm and no murmurs without lower extremity edema ABDOMEN:abdomen soft, non-tender and normal bowel sounds Musculoskeletal:no cyanosis of digits and no clubbing  PSYCH: alert & oriented x 3 with fluent speech NEURO: no focal motor/sensory deficits  LABORATORY DATA:  I have reviewed the data as listed Lab Results  Component Value Date   WBC 4.6 07/27/2023   HGB 13.9 07/27/2023   HCT 42.6 07/27/2023   MCV 97.9 07/27/2023   PLT 195 07/27/2023   Recent Labs    05/11/23 1056 06/01/23 1104 07/27/23 1035  NA 136 134* 140  K 3.6 3.7 3.7  CL 103 103 104  CO2 24 24 25   GLUCOSE 122* 135* 104*  BUN 14 16 16   CREATININE 1.10 1.02 1.06  CALCIUM  9.2 8.6* 8.9  GFRNONAA >60 >60 >60  PROT 6.6 6.6 6.6  ALBUMIN 3.8 3.8 3.8  AST 26 28 26   ALT 23 24 24   ALKPHOS 58 56 50  BILITOT 0.8 0.8 0.8    RADIOGRAPHIC STUDIES: I have personally reviewed the radiological images as listed and agreed with the findings in the report.  No results found.  ASSESSMENT & PLAN:  Patient is a 68 y.o. male following for multiple myeloma in remission  Assessment & Plan Multiple myeloma in remission [facility]) Patient has started with multiple myeloma diagnosed in 2022.  S/p treatment with Dara VRD followed by  maintenance Dara/Revlimid  for 2 years.  Currently on limited 10 mg 3 weeks on 1 week off.  Patient collected for stem cell transplant but reserved for CR2  - Reviewed labs today.  Within normal limits.  No evidence of recurrence of multiple myeloma - Continue Revlimid  10 mg - 3 weeks on/1 week off - Can discontinue acyclovir  at this time  Return to clinic in 3 months with labs. 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 Started on Zometa  in June 2023  - Continue Zometa  every 12 weeks at this time - Will continue Zometa  for a year and can consider discontinuing at that point if there is no relapse of disease - Can continue calcium  and vitamin D    Orders Placed This Encounter  Procedures   CBC with Differential/Platelet    Standing Status:   Future    Expected Date:   11/01/2023    Expiration Date:   01/30/2024   Comprehensive metabolic panel with GFR    Standing Status:   Future    Expected Date:   11/01/2023    Expiration Date:   01/30/2024   Multiple Myeloma Panel (SPEP&IFE w/QIG)    Standing Status:   Future    Expected Date:   11/01/2023    Expiration Date:   01/30/2024   Kappa/lambda light chains    Standing Status:   Future    Expected Date:   11/01/2023    Expiration Date:   01/30/2024    All questions were answered. The patient knows to call the clinic with any problems, questions or concerns.  The total time spent in the appointment was 60 minutes encounter with patients including review of chart and various tests results, discussions about plan of care and coordination of care plan   [name], MD 08/03/2023 2:04 PM

## 2023-08-03 NOTE — Assessment & Plan Note (Signed)
 Patient has metastatic disease to bone. Started on Zometa  in June 2023  - Continue Zometa  every 12 weeks at this time - Will continue Zometa  for a year and can consider discontinuing at that point if there is no relapse of disease - Can continue calcium  and vitamin D 

## 2023-08-03 NOTE — Progress Notes (Signed)
 Patient presents today for Zometa  4 mg IV per provider's orders. Vital signs stable and patient voiced no new complaints at this time. Patient's Calcium  noted to be 8.9 on 07/27/23.   Patient denies any tooth or jaw pain and no recent or future major dental appointments at this time. Patient reports taking Calcium /Vit D supplements as directed.  Discharged from clinic ambulatory in stable condition. Alert and oriented x 3. F/U with [facility] as scheduled.

## 2023-08-13 ENCOUNTER — Other Ambulatory Visit: Payer: Self-pay | Admitting: Hematology

## 2023-08-25 ENCOUNTER — Ambulatory Visit: Admitting: Student

## 2023-09-03 ENCOUNTER — Encounter: Payer: Self-pay | Admitting: Radiology

## 2023-09-06 ENCOUNTER — Other Ambulatory Visit: Payer: Self-pay | Admitting: Oncology

## 2023-09-07 ENCOUNTER — Other Ambulatory Visit: Payer: Self-pay

## 2023-09-07 ENCOUNTER — Encounter (HOSPITAL_COMMUNITY): Payer: Self-pay | Admitting: Oncology

## 2023-09-07 MED ORDER — LENALIDOMIDE 10 MG PO CAPS: ORAL_CAPSULE | ORAL | 0 refills | Status: DC

## 2023-09-07 NOTE — Telephone Encounter (Signed)
 Chart reviewed. Revlimid  refilled per last office note with Dr. Davonna.

## 2023-09-10 NOTE — Progress Notes (Unsigned)
   Cardiology Office Note    Date:  09/10/2023  ID:  Eugene [name], DOB 23-Feb-1955, MRN [id number] Cardiologist: None { :  History of Present Illness:    Eugene [name] is a 68 y.o. male with past medical history of CAD (s/p NSTEMI in 01/2017 with cath showing 25% prox-LAD stenosis and no obstructive disease), HTN, HLD and multiple myeloma who presents to the office today for 1 year follow-up.  He was examined by myself in 07/2022 and reported having worsening knee pain but denied any exertional chest pain or dyspnea.  He was continued on his current cardiac medications with ASA 81 mg daily, Toprol -XL 25 mg daily and Crestor  40 mg daily.   Studies Reviewed:   EKG: EKG is*** ordered today and demonstrates ***   EKG Interpretation Date/Time:    Ventricular Rate:    PR Interval:    QRS Duration:    QT Interval:    QTC Calculation:   R Axis:      Text Interpretation:         Cardiac Catheterization: 01/2017 Prox LAD lesion is 25% stenosed. The left ventricular systolic function is normal. LV end diastolic pressure is normal. The left ventricular ejection fraction is 55-65% by visual estimate.   1. No significant obstructive CAD. Initial views were concerning for hypodensity in the proximal LAD but there was poor filling due to poor catheter engagement. When a guide catheter was used with better filling this was less with no filling defect noted. 2. Normal LV function 3. Normal LVEDP   Plan: medical therapy. Cycle cardiac enzymes and Ecg. Check Echo.   Echocardiogram: 01/2017 Study Conclusions   - Left ventricle: The cavity size was normal. Wall thickness was    normal. Systolic function was normal. The estimated ejection    fraction was in the range of 55% to 60%. Wall motion was normal;    there were no regional wall motion abnormalities. Left    ventricular diastolic function parameters were normal.  - Left atrium: The atrium was mildly dilated.  - Pulmonary arteries:  PA peak pressure: 33 mm Hg (S).   Risk Assessment/Calculations:   {Does this patient have ATRIAL FIBRILLATION?:[id number]} No BP recorded.  {Refresh Note OR Click here to enter BP  :1}***         Physical Exam:   VS:  There were no vitals taken for this visit.   Wt Readings from Last 3 Encounters:  08/03/23 235 lb 14.3 oz (107 kg)  06/22/23 234 lb 12.8 oz (106.5 kg)  06/08/23 235 lb (106.6 kg)     GEN: Well nourished, well developed in no acute distress NECK: No JVD; No carotid bruits CARDIAC: ***RRR, no murmurs, rubs, gallops RESPIRATORY:  Clear to auscultation without rales, wheezing or rhonchi  ABDOMEN: Appears non-distended. No obvious abdominal masses. EXTREMITIES: No clubbing or cyanosis. No edema.  Distal pedal pulses are 2+ bilaterally.   Assessment and Plan:   1. Coronary artery disease involving native coronary artery of native heart without angina pectoris - Prior cardiac catheterization in 2019 only showed 25% stenosis along the proximal LAD. *** - Continue ASA 81 mg daily, Toprol -XL 25 mg daily and Crestor  40 mg daily.  2. Hyperlipidemia LDL goal <70 - LDL was at 69 when checked in 07/2023. Continue current medical therapy with Crestor  40 mg daily.   Signed, Laymon CHRISTELLA Qua, PA-C

## 2023-09-15 ENCOUNTER — Ambulatory Visit: Attending: Student | Admitting: Student

## 2023-09-15 ENCOUNTER — Encounter: Payer: Self-pay | Admitting: Student

## 2023-09-15 VITALS — BP 119/66 | HR 58 | Ht 69.0 in | Wt 233.0 lb

## 2023-09-15 DIAGNOSIS — C9000 Multiple myeloma not having achieved remission: Secondary | ICD-10-CM | POA: Diagnosis present

## 2023-09-15 DIAGNOSIS — I2510 Atherosclerotic heart disease of native coronary artery without angina pectoris: Secondary | ICD-10-CM | POA: Diagnosis not present

## 2023-09-15 DIAGNOSIS — E785 Hyperlipidemia, unspecified: Secondary | ICD-10-CM | POA: Insufficient documentation

## 2023-09-15 MED ORDER — METOPROLOL SUCCINATE ER 25 MG PO TB24: 25.0000 mg | ORAL_TABLET | Freq: Every day | ORAL | 3 refills | Status: AC

## 2023-09-15 NOTE — Patient Instructions (Signed)
 Medication Instructions:  Your physician recommends that you continue on your current medications as directed. Please refer to the Current Medication list given to you today.  *If you need a refill on your cardiac medications before your next appointment, please call your pharmacy*  Lab Work: NONE   If you have labs (blood work) drawn today and your tests are completely normal, you will receive your results only by: MyChart Message (if you have MyChart) OR A paper copy in the mail If you have any lab test that is abnormal or we need to change your treatment, we will call you to review the results.  Testing/Procedures: NONE   Follow-Up: At [facility], you and your health needs are our priority.  As part of our continuing mission to provide you with exceptional heart care, our providers are all part of one team.  This team includes your primary Cardiologist (physician) and Advanced Practice Providers or APPs (Physician Assistants and Nurse Practitioners) who all work together to provide you with the care you need, when you need it.  Your next appointment:   1 year(s)  Provider:   Dorn Ross, MD    We recommend signing up for the patient portal called MyChart.  Sign up information is provided on this After Visit Summary.  MyChart is used to connect with patients for Virtual Visits (Telemedicine).  Patients are able to view lab/test results, encounter notes, upcoming appointments, etc.  Non-urgent messages can be sent to your provider as well.   To learn more about what you can do with MyChart, go to [email or web address].   Other Instructions Thank you for choosing [facility]!

## 2023-09-29 ENCOUNTER — Other Ambulatory Visit: Payer: Self-pay | Admitting: Cardiology

## 2023-10-12 ENCOUNTER — Other Ambulatory Visit: Payer: Self-pay | Admitting: Oncology

## 2023-10-12 NOTE — Telephone Encounter (Signed)
 Chart reviewed. Revlimid  refilled per last office note with Dr. Davonna.

## 2023-10-19 ENCOUNTER — Inpatient Hospital Stay: Attending: Hematology

## 2023-10-19 DIAGNOSIS — C9001 Multiple myeloma in remission: Secondary | ICD-10-CM | POA: Diagnosis present

## 2023-10-19 DIAGNOSIS — Z7982 Long term (current) use of aspirin: Secondary | ICD-10-CM | POA: Insufficient documentation

## 2023-10-19 LAB — CBC WITH DIFFERENTIAL/PLATELET
Abs Immature Granulocytes: 0.01 K/uL (ref 0.00–0.07)
Basophils Absolute: 0 K/uL (ref 0.0–0.1)
Basophils Relative: 1 %
Eosinophils Absolute: 0.1 K/uL (ref 0.0–0.5)
Eosinophils Relative: 1 %
HCT: 41.7 % (ref 39.0–52.0)
Hemoglobin: 13.8 g/dL (ref 13.0–17.0)
Immature Granulocytes: 0 %
Lymphocytes Relative: 51 %
Lymphs Abs: 2 K/uL (ref 0.7–4.0)
MCH: 32.5 pg (ref 26.0–34.0)
MCHC: 33.1 g/dL (ref 30.0–36.0)
MCV: 98.1 fL (ref 80.0–100.0)
Monocytes Absolute: 0.6 K/uL (ref 0.1–1.0)
Monocytes Relative: 15 %
Neutro Abs: 1.3 K/uL — ABNORMAL LOW (ref 1.7–7.7)
Neutrophils Relative %: 32 %
Platelets: 209 K/uL (ref 150–400)
RBC: 4.25 MIL/uL (ref 4.22–5.81)
RDW: 13.3 % (ref 11.5–15.5)
WBC: 4 K/uL (ref 4.0–10.5)
nRBC: 0 % (ref 0.0–0.2)

## 2023-10-19 LAB — COMPREHENSIVE METABOLIC PANEL WITH GFR
ALT: 27 U/L (ref 0–44)
AST: 31 U/L (ref 15–41)
Albumin: 4.3 g/dL (ref 3.5–5.0)
Alkaline Phosphatase: 61 U/L (ref 38–126)
Anion gap: 14 (ref 5–15)
BUN: 14 mg/dL (ref 8–23)
CO2: 26 mmol/L (ref 22–32)
Calcium: 9.2 mg/dL (ref 8.9–10.3)
Chloride: 104 mmol/L (ref 98–111)
Creatinine, Ser: 1.11 mg/dL (ref 0.61–1.24)
GFR, Estimated: >60 mL/min (ref >60)
Glucose, Bld: 95 mg/dL (ref 70–99)
Potassium: 3.8 mmol/L (ref 3.5–5.1)
Sodium: 144 mmol/L (ref 135–145)
Total Bilirubin: 0.6 mg/dL (ref 0.0–1.2)
Total Protein: 7.5 g/dL (ref 6.5–8.1)

## 2023-10-20 LAB — KAPPA/LAMBDA LIGHT CHAINS
Kappa free light chain: 30.7 mg/L — ABNORMAL HIGH (ref 3.3–19.4)
Kappa, lambda light chain ratio: 1.58 (ref 0.26–1.65)
Lambda free light chains: 19.4 mg/L (ref 5.7–26.3)

## 2023-10-22 LAB — MULTIPLE MYELOMA PANEL, SERUM
Albumin SerPl Elph-Mcnc: 3.5 g/dL (ref 2.9–4.4)
Albumin/Glob SerPl: 1.2 (ref 0.7–1.7)
Alpha 1: 0.2 g/dL (ref 0.0–0.4)
Alpha2 Glob SerPl Elph-Mcnc: 0.6 g/dL (ref 0.4–1.0)
B-Globulin SerPl Elph-Mcnc: 1 g/dL (ref 0.7–1.3)
Gamma Glob SerPl Elph-Mcnc: 1.3 g/dL (ref 0.4–1.8)
Globulin, Total: 3.1 g/dL (ref 2.2–3.9)
IgA: 148 mg/dL (ref 61–437)
IgG (Immunoglobin G), Serum: 1411 mg/dL (ref 603–1613)
IgM (Immunoglobulin M), Srm: 33 mg/dL (ref 20–172)
Total Protein ELP: 6.6 g/dL (ref 6.0–8.5)

## 2023-10-26 ENCOUNTER — Inpatient Hospital Stay

## 2023-10-26 ENCOUNTER — Inpatient Hospital Stay: Admitting: Oncology

## 2023-10-26 VITALS — BP 123/68 | HR 61 | Temp 98.6°F | Resp 20 | Wt 235.0 lb

## 2023-10-26 DIAGNOSIS — C9000 Multiple myeloma not having achieved remission: Secondary | ICD-10-CM

## 2023-10-26 DIAGNOSIS — C9001 Multiple myeloma in remission: Secondary | ICD-10-CM | POA: Diagnosis not present

## 2023-10-26 MED ORDER — SODIUM CHLORIDE 0.9 % IV SOLN: Freq: Once | INTRAVENOUS | Status: AC

## 2023-10-26 MED ORDER — ZOLEDRONIC ACID 4 MG/100ML IV SOLN
4.0000 mg | Freq: Once | INTRAVENOUS | Status: AC
  Administered 2023-10-26: 4 mg via INTRAVENOUS
  Filled 2023-10-26: qty 100

## 2023-10-26 NOTE — Patient Instructions (Signed)
 [facility] at [facility] Discharge Instructions   You were seen and examined today by Dr. Davonna.  She reviewed the results of your lab work which are normal/stable.   We will proceed with your Zometa  infusion today.   Return as scheduled.    Thank you for choosing [facility] at [facility] to provide your oncology and hematology care.  To afford each patient quality time with our provider, please arrive at least 15 minutes before your scheduled appointment time.   If you have a lab appointment with the Cancer Center please come in thru the Main Entrance and check in at the main information desk.  You need to re-schedule your appointment should you arrive 10 or more minutes late.  We strive to give you quality time with our providers, and arriving late affects you and other patients whose appointments are after yours.  Also, if you no show three or more times for appointments you may be dismissed from the clinic at the providers discretion.     Again, thank you for choosing [facility].  Our hope is that these requests will decrease the amount of time that you wait before being seen by our physicians.       _____________________________________________________________  Should you have questions after your visit to [facility], please contact our office at [phone] and follow the prompts.  Our office hours are 8:00 a.m. and 4:30 p.m. Monday - Friday.  Please note that voicemails left after 4:00 p.m. may not be returned until the following business day.  We are closed weekends and major holidays.  You do have access to a nurse 24-7, just call the main number to the clinic [phone] and do not press any options, hold on the line and a nurse will answer the phone.    For prescription refill requests, have your pharmacy contact our office and allow 72 hours.    Due to Covid, you will need to wear a mask upon  entering the hospital. If you do not have a mask, a mask will be given to you at the Main Entrance upon arrival. For doctor visits, patients may have 1 support person age 56 or older with them. For treatment visits, patients can not have anyone with them due to social distancing guidelines and our immunocompromised population.

## 2023-10-26 NOTE — Progress Notes (Signed)
Patient is taking Revlimid as prescribed.  She has not missed any doses and reports no side effects at this time.   

## 2023-10-26 NOTE — Progress Notes (Signed)
 Patient presents today for Zometa  infusion per providers order.  Vital signs and labs reviewed by MD.  Message received from Isaiah Piety RN/Dr. Davonna patient okay for treatment.  Stable during infusion without adverse affects.  Vital signs stable.  No complaints at this time.  Discharge from clinic ambulatory in stable condition.  Alert and oriented X 3.  Follow up with [facility] as scheduled.

## 2023-10-26 NOTE — Patient Instructions (Signed)
 [facility] [place] - A DEPT OF [facility]  Discharge Instructions: Thank you for choosing [facility] to provide your oncology and hematology care.  If you have a lab appointment with the Cancer Center - please note that after April 8th, 2024, all labs will be drawn in the cancer center.  You do not have to check in or register with the main entrance as you have in the past but will complete your check-in in the cancer center.  Wear comfortable clothing and clothing appropriate for easy access to any Portacath or PICC line.   We strive to give you quality time with your provider. You may need to reschedule your appointment if you arrive late (15 or more minutes).  Arriving late affects you and other patients whose appointments are after yours.  Also, if you miss three or more appointments without notifying the office, you may be dismissed from the clinic at the provider's discretion.      For prescription refill requests, have your pharmacy contact our office and allow 72 hours for refills to be completed.    Today you received the following chemotherapy and/or immunotherapy agents Zometa      To help prevent nausea and vomiting after your treatment, we encourage you to take your nausea medication as directed.  BELOW ARE SYMPTOMS THAT SHOULD BE REPORTED IMMEDIATELY: *FEVER GREATER THAN 100.4 F (38 C) OR HIGHER *CHILLS OR SWEATING *NAUSEA AND VOMITING THAT IS NOT CONTROLLED WITH YOUR NAUSEA MEDICATION *UNUSUAL SHORTNESS OF BREATH *UNUSUAL BRUISING OR BLEEDING *URINARY PROBLEMS (pain or burning when urinating, or frequent urination) *BOWEL PROBLEMS (unusual diarrhea, constipation, pain near the anus) TENDERNESS IN MOUTH AND THROAT WITH OR WITHOUT PRESENCE OF ULCERS (sore throat, sores in mouth, or a toothache) UNUSUAL RASH, SWELLING OR PAIN  UNUSUAL VAGINAL DISCHARGE OR ITCHING   Items with * indicate a potential emergency and should be followed up as  soon as possible or go to the Emergency Department if any problems should occur.  Please show the CHEMOTHERAPY ALERT CARD or IMMUNOTHERAPY ALERT CARD at check-in to the Emergency Department and triage nurse.  Should you have questions after your visit or need to cancel or reschedule your appointment, please contact [facility] [place] - A DEPT OF [facility] [phone]  and follow the prompts.  Office hours are 8:00 a.m. to 4:30 p.m. Monday - Friday. Please note that voicemails left after 4:00 p.m. may not be returned until the following business day.  We are closed weekends and major holidays. You have access to a nurse at all times for urgent questions. Please call the main number to the clinic [phone] and follow the prompts.  For any non-urgent questions, you may also contact your provider using MyChart. We now offer e-Visits for anyone 99 and older to request care online for non-urgent symptoms. For details visit [email or web address].   Also download the MyChart app! Go to the app store, search "MyChart", open the app, select [place], and log in with your MyChart username and password.

## 2023-10-26 NOTE — Progress Notes (Signed)
 Patient Care Team: Tobie Suzzane POUR, MD as PCP - General (Internal Medicine) Alvan Dorn FALCON, MD as PCP - Cardiology (Cardiology) Cindie Carlin POUR, DO as Consulting Physician (Internal Medicine) Celestia Joesph SQUIBB, RN as Oncology Nurse Navigator (Oncology) Alvan, Dorn FALCON, MD as Consulting Physician (Cardiology) Darroll Anes, DO as Consulting Physician (Optometry) Brenna Lin, MD (Inactive) as Attending Physician (Orthopedic Surgery) [name]  Clinic Day:  10/26/2023  Referring physician: Tobie Suzzane POUR, MD   CHIEF COMPLAINT:  CC: IgG kappa multiple myeloma   ASSESSMENT & PLAN:   Assessment & Plan: [name]  is a 68 y.o. male with IgG kappa multiple myeloma  Assessment and Plan Assessment & Plan Multiple myeloma in remission Multiple myeloma diagnosed in 2022.  S/p treatment with Dara VRD followed by maintenance Dara/Revlimid  for 2 years.  Currently on limited 10 mg 3 weeks on 1 week off.  Patient collected for stem cell transplant but reserved for CR2   - Reviewed labs today.  CMP: WNL, CBC: WNL, SPEP: No M spike, IFE: Unremarkable, kappa free light chain slightly elevated at 30.7, lambda free light chains: 19.4, normal ratio.  Within normal limits.  No evidence of recurrence of multiple myeloma - Continue Revlimid  10 mg - 3 weeks on/1 week off - Continue aspirin  daily   Return to clinic in 3 months with labs.  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  -Continue Zometa  infusion every 12 weeks. - Will continue Zometa  for a year and can consider discontinuing at that point if there is no relapse of disease  - Continue calcium  and vitamin D  supplementation.   The patient understands the plans discussed today and is in agreement with them.  He knows to contact our office if he develops concerns prior to his next appointment.  20 minutes of total  time was spent for this patient encounter, including preparation,face-to-face counseling with the patient and coordination of care, physical exam, and documentation of the encounter.   [name], MD  [facility] [place] - A DEPT OF [facility] [address] [place] [address] Dept: [phone] Dept Fax: [phone]   Orders Placed This Encounter  Procedures   CBC with Differential    Standing Status:   Future    Expected Date:   01/24/2024    Expiration Date:   04/23/2024   Comprehensive metabolic panel    Standing Status:   Future    Expected Date:   01/24/2024    Expiration Date:   04/23/2024   Kappa/lambda light chains    Standing Status:   Future    Expected Date:   01/24/2024    Expiration Date:   04/23/2024   Immunofixation electrophoresis    Standing Status:   Future    Expected Date:   01/24/2024    Expiration Date:   04/23/2024   Protein electrophoresis, serum    Standing Status:   Future    Expected Date:   01/24/2024    Expiration Date:   04/23/2024     ONCOLOGY HISTORY:   Diagnosis: Stage I standard risk, IgG kappa multiple myeloma  - 04/2017: Reported low back pain radiating to right thigh - 10/22/2017: Skeletal survey: No suspicious lytic lesions - 12/06/2018: Abnormal M spike of 3.2 g -06/11/2020: X-ray of right sided pelvis showed prominent lytic lesions noted in the proximal and midportion of  the right femoral diaphysis with no evidence of fracture or dislocation - 07/04/2020: PET scan: Multiple hypermetabolic bony soft tissue lesions. Dominant lesions in the L5 vertebral body and posterior right acetabulum and distal right femur.  Focal increased uptake in the right tonsillar region with associated soft tissue fullness on CT imaging.  - Labs: 24-hour urine protein: 134, beta-2  microglobulin: 2.1, LDH: 136 - 07/09/2020: Bone marrow biopsy showed hypercellular marrow with trilineage hematopoiesis.   Plasma cells representing 10% of cells             -Chromosome analysis: 46 XY - Multiple myeloma FISH: Negative - 09/03/2020: Started on Dara VRD - 09/2020: Stem cell collection at [facility].  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  Soft tissue lesion in the posterior right acetabulum measures 2.2 cm SUV 2.5, previously 2.2 cm with SUV 9.  Hypermetabolism in the left tonsil SUV 5.9.  Direct oropharyngeal exam did not show any mass.  New mildly hypermetabolic left lower pulmonary nodules nonspecific.  -07/2021: Started Zometa  every 6 weeks -05/2022: Changed Zometa  to every 12 weeks - 06/08/2023: Last dose of daratumumab .  Completed 2 years of treatment.  Continuing Revlimid  10 mg daily 3 weeks on /1 week off.   Current treatment: Revlimid  10 mg daily for 3 weeks followed by 1 week break  INTERVAL HISTORY:   Discussed the use of AI scribe software for clinical note transcription with the patient, who gave verbal consent to proceed.  History of Present Illness Eugene [name] is a 68 year old male with multiple myeloma who presents for routine follow-up for his multiple myeloma.  He feels generally well with only mild tiredness. He is currently on Revlimid , and he reports no new complaints.  His medication regimen includes calcium , vitamin D , and aspirin . Acyclovir  was discontinued during the last visit.  Lab results show stable kidney function and hemoglobin levels. There is a slight increase in free light chains, but the M spike and ratio remain normal.  No significant issues were noted during the review of symptoms, with only a little tiredness reported.    I have reviewed the past medical history, past surgical history, social history and family history with  the patient and they are unchanged from previous note.  ALLERGIES:  has no known allergies.  MEDICATIONS:  Current Outpatient Medications  Medication Sig Dispense Refill   aspirin  EC 81 MG tablet Take by mouth.     calcium -vitamin D  (OSCAL WITH D) 500-200 MG-UNIT tablet Take 1 tablet by mouth.     lenalidomide  (REVLIMID ) 10 MG capsule TAKE 1 CAPSULE BY MOUTH 1 TIME A DAY FOR 21 DAYS ON THEN 7 DAYS OFF 21 capsule 0   metoprolol  succinate (TOPROL -XL) 25 MG 24 hr tablet Take 1 tablet (25 mg total) by mouth daily. 90 tablet 3   Multiple Vitamin (MULTIVITAMIN) tablet Take 1 tablet by mouth daily.       Multiple Vitamins/Iron TABS Take by mouth.     nitroGLYCERIN  (NITROSTAT ) 0.4 MG SL tablet Place 1 tablet (0.4 mg total) under the tongue every 5 (five) minutes for 3 doses as needed for chest pain. 25 tablet 3   ondansetron  (ZOFRAN ) 8 MG tablet Take 8 mg by mouth every 8 (eight) hours as needed  for nausea or vomiting.     prochlorperazine  (COMPAZINE ) 10 MG tablet Take 10 mg by mouth every 8 (eight) hours as needed for nausea or vomiting.     rosuvastatin  (CRESTOR ) 40 MG tablet Take 1 tablet (40 mg total) by mouth every other day. 90 tablet 1   traMADol  (ULTRAM ) 50 MG tablet Take 1 tablet (50 mg total) by mouth every 6 (six) hours as needed. 60 tablet 0   No current facility-administered medications for this visit.    REVIEW OF SYSTEMS:   Constitutional: Denies fevers, chills or abnormal weight loss Eyes: Denies blurriness of vision Ears, nose, mouth, throat, and face: Denies mucositis or sore throat Respiratory: Denies cough, dyspnea or wheezes Cardiovascular: Denies palpitation, chest discomfort or lower extremity swelling Gastrointestinal:  Denies nausea, heartburn or change in bowel habits Skin: Denies abnormal skin rashes Lymphatics: Denies new lymphadenopathy or easy bruising Neurological:Denies numbness, tingling or new weaknesses Behavioral/Psych: Mood is stable, no new changes  All  other systems were reviewed with the patient and are negative.   VITALS:  Blood pressure 123/68, pulse 61, temperature 98.6 F (37 C), temperature source Oral, resp. rate 20, weight 235 lb (106.6 kg), SpO2 99%.  Wt Readings from Last 3 Encounters:  10/26/23 235 lb (106.6 kg)  09/15/23 233 lb (105.7 kg)  08/03/23 235 lb 14.3 oz (107 kg)    Body mass index is 34.7 kg/m.  Performance status (ECOG): 0 - Asymptomatic  PHYSICAL EXAM:   GENERAL:alert, no distress and comfortable SKIN: skin color, texture, turgor are normal, no rashes or significant lesions LYMPH:  no palpable lymphadenopathy in the cervical, axillary or inguinal LUNGS: clear to auscultation and percussion with normal breathing effort HEART: regular rate & rhythm and no murmurs and no lower extremity edema ABDOMEN:abdomen soft, non-tender and normal bowel sounds Musculoskeletal:no cyanosis of digits and no clubbing  NEURO: alert & oriented x 3 with fluent speech, no focal motor/sensory deficits  LABORATORY DATA:  I have reviewed the data as listed     Component Value Date/Time   NA 144 10/19/2023 1143   K 3.8 10/19/2023 1143   CL 104 10/19/2023 1143   CO2 26 10/19/2023 1143   GLUCOSE 95 10/19/2023 1143   BUN 14 10/19/2023 1143   CREATININE 1.11 10/19/2023 1143   CREATININE 1.09 06/02/2019 0811   CALCIUM  9.2 10/19/2023 1143   PROT 7.5 10/19/2023 1143   ALBUMIN 4.3 10/19/2023 1143   AST 31 10/19/2023 1143   ALT 27 10/19/2023 1143   ALKPHOS 61 10/19/2023 1143   BILITOT 0.6 10/19/2023 1143   GFRNONAA >60 10/19/2023 1143   GFRNONAA 75 08/05/2017 0939   GFRAA >60 06/24/2018 0152   GFRAA 87 08/05/2017 0939     Lab Results  Component Value Date   WBC 4.0 10/19/2023   NEUTROABS 1.3 (L) 10/19/2023   HGB 13.8 10/19/2023   HCT 41.7 10/19/2023   MCV 98.1 10/19/2023   PLT 209 10/19/2023      Chemistry      Component Value Date/Time   NA 144 10/19/2023 1143   K 3.8 10/19/2023 1143   CL 104 10/19/2023  1143   CO2 26 10/19/2023 1143   BUN 14 10/19/2023 1143   CREATININE 1.11 10/19/2023 1143   CREATININE 1.09 06/02/2019 0811      Component Value Date/Time   CALCIUM  9.2 10/19/2023 1143   ALKPHOS 61 10/19/2023 1143   AST 31 10/19/2023 1143   ALT 27 10/19/2023 1143   BILITOT 0.6 10/19/2023  1143      Latest Reference Range & Units 10/19/23 11:43  Total Protein ELP 6.0 - 8.5 g/dL 6.6 (C)  Albumin SerPl Elph-Mcnc 2.9 - 4.4 g/dL 3.5 (C)  Albumin/Glob SerPl 0.7 - 1.7  1.2 (C)  Alpha2 Glob SerPl Elph-Mcnc 0.4 - 1.0 g/dL 0.6 (C)  Alpha 1 0.0 - 0.4 g/dL 0.2 (C)  Gamma Glob SerPl Elph-Mcnc 0.4 - 1.8 g/dL 1.3 (C)  M Protein SerPl Elph-Mcnc Not Observed g/dL Not Observed (C)  IFE 1  Comment (C)  Globulin, Total 2.2 - 3.9 g/dL 3.1 (C)  B-Globulin SerPl Elph-Mcnc 0.7 - 1.3 g/dL 1.0 (C)  IgG (Immunoglobin G), Serum 603 - 1,613 mg/dL 8,588  IgM (Immunoglobulin M), Srm 20 - 172 mg/dL 33  IgA 61 - 562 mg/dL 851  (C): Corrected    Latest Reference Range & Units 10/19/23 11:43  Kappa free light chain 3.3 - 19.4 mg/L 30.7 (H)  Lambda free light chains 5.7 - 26.3 mg/L 19.4  Kappa, lambda light chain ratio 0.26 - 1.65  1.58  (H): Data is abnormally high  RADIOGRAPHIC STUDIES: I have personally reviewed the radiological images as listed and agreed with the findings in the report.

## 2023-10-30 ENCOUNTER — Encounter (HOSPITAL_COMMUNITY): Payer: Self-pay | Admitting: Oncology

## 2023-11-10 ENCOUNTER — Other Ambulatory Visit: Payer: Self-pay | Admitting: Oncology

## 2023-11-10 NOTE — Telephone Encounter (Signed)
 Chart reviewed. Revlimid  refilled per last office note with Dr. Davonna.

## 2023-11-15 ENCOUNTER — Encounter: Payer: Self-pay | Admitting: Radiology

## 2023-12-08 ENCOUNTER — Other Ambulatory Visit: Payer: Self-pay | Admitting: Oncology

## 2023-12-08 NOTE — Telephone Encounter (Signed)
 Chart reviewed. Revlimid  refilled per last office note with Dr. Davonna.

## 2023-12-22 ENCOUNTER — Encounter: Payer: Self-pay | Admitting: Internal Medicine

## 2023-12-22 ENCOUNTER — Ambulatory Visit (INDEPENDENT_AMBULATORY_CARE_PROVIDER_SITE_OTHER): Admitting: Internal Medicine

## 2023-12-22 VITALS — BP 134/64 | HR 67 | Ht 69.0 in | Wt 235.0 lb

## 2023-12-22 DIAGNOSIS — C9001 Multiple myeloma in remission: Secondary | ICD-10-CM

## 2023-12-22 DIAGNOSIS — E782 Mixed hyperlipidemia: Secondary | ICD-10-CM | POA: Diagnosis not present

## 2023-12-22 DIAGNOSIS — I2510 Atherosclerotic heart disease of native coronary artery without angina pectoris: Secondary | ICD-10-CM

## 2023-12-22 DIAGNOSIS — I10 Essential (primary) hypertension: Secondary | ICD-10-CM

## 2023-12-22 DIAGNOSIS — E6609 Other obesity due to excess calories: Secondary | ICD-10-CM

## 2023-12-22 DIAGNOSIS — Z1211 Encounter for screening for malignant neoplasm of colon: Secondary | ICD-10-CM | POA: Diagnosis not present

## 2023-12-22 DIAGNOSIS — C9000 Multiple myeloma not having achieved remission: Secondary | ICD-10-CM

## 2023-12-22 DIAGNOSIS — E66811 Obesity, class 1: Secondary | ICD-10-CM

## 2023-12-22 MED ORDER — ROSUVASTATIN CALCIUM 40 MG PO TABS: 40.0000 mg | ORAL_TABLET | ORAL | 3 refills | Status: AC

## 2023-12-22 NOTE — Assessment & Plan Note (Signed)
Has had cardiac cath in the past, no stents On Aspirin, statin and B-blocker Counseled again to stay compliant to statin therapy F/u with Cardiology 

## 2023-12-22 NOTE — Assessment & Plan Note (Signed)
 Needs to take Crestor, at least QOD Reviewed last lipid profile

## 2023-12-22 NOTE — Patient Instructions (Signed)
 Please continue to take medications as prescribed.  Please continue to follow low salt diet and perform moderate exercise/walking as tolerated.

## 2023-12-22 NOTE — Assessment & Plan Note (Signed)
Undergoing chemotherapy for MM ?S/p stem cell collection at [facility] ?Followed by Oncology ?

## 2023-12-22 NOTE — Progress Notes (Unsigned)
 Established Patient Office Visit  Subjective:  Patient ID: Eugene [name], male    DOB: [birth year]  Age: 68 y.o. MRN: [id number]  CC:  Chief Complaint  Patient presents with   Hypertension    6 month f/u    Coronary Artery Disease    6 month f/u     HPI Eugene [name] is a 68 y.o. male with past medical history of CAD, HTN, multiple myeloma and obesity who presents for f/u of his chronic medical conditions.  HTN: BP is well-controlled. Takes medications regularly. Patient denies headache, dizziness, chest pain, dyspnea or palpitations.  CAD: Takes Aspirin  and Metoprolol . Does not take Crestor  regularly, advised to take it at least QOD.   MM: Followed by Oncology, undergoing chemotherapy. Has had stem cell collection at [facility].  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  Past Medical History:  Diagnosis Date   Back pain    CAD (coronary artery disease)    a. 01/2017 NSTEMI/Cath: LM nl, LAD 25p - ? hypodense but no filling defect, RI nl, LCX nl, RCA nl, EF 55-65%-->Med Rx.   Erectile dysfunction    History of echocardiogram    a. 01/2017 Echo: EF 55-60%, no rwma.   Hypertension     Past Surgical History:  Procedure Laterality Date   LEFT HEART CATH AND CORONARY ANGIOGRAPHY N/A 01/15/2017   Procedure: LEFT HEART CATH AND CORONARY ANGIOGRAPHY;  Surgeon: Jordan, Peter M, MD;  Location: [facility] INVASIVE CV LAB;  Service: Cardiovascular;  Laterality: N/A;   ORIF trocanteric femoral IM Nail Right     Family History  Problem Relation Age of Onset   Cancer Mother        breast   Diabetes Mother    Diabetes Sister    Hypertension Sister    Cancer Brother    Mental illness Sister     Social History   Socioeconomic History   Marital status: Significant Other    Spouse name: Not on file   Number of children: Not on file   Years of education: Not on file   Highest education level: Not on file  Occupational History   Not on file  Tobacco Use    Smoking status: Never   Smokeless tobacco: Never  Vaping Use   Vaping status: Never Used  Substance and Sexual Activity   Alcohol use: No   Drug use: No   Sexual activity: Yes  Other Topics Concern   Not on file  Social History Narrative   Not on file   Social Drivers of Health   Tobacco Use: Low Risk (12/22/2023)   Patient History    Smoking Tobacco Use: Never    Smokeless Tobacco Use: Never    Passive Exposure: Not on file  Financial Resource Strain: Low Risk (06/01/2023)   Overall Financial Resource Strain (CARDIA)    Difficulty of Paying Living Expenses: Not hard at all  Food Insecurity: No Food Insecurity (06/01/2023)   Hunger Vital Sign    Worried About Running Out of Food in the Last Year: Never true    Ran Out of Food in the Last Year: Never true  Transportation Needs: No Transportation Needs (06/01/2023)   PRAPARE - Administrator, Civil Service (Medical): No    Lack of Transportation (Non-Medical): No  Physical Activity: Inactive (06/01/2023)   Exercise Vital Sign    Days of Exercise per Week: 0 days    Minutes of Exercise per Session:  0 min  Stress: No Stress Concern Present (06/01/2023)   [employer] of Occupational Health - Occupational Stress Questionnaire    Feeling of Stress : Not at all  Social Connections: Moderately Isolated (06/01/2023)   Social Connection and Isolation Panel    Frequency of Communication with Friends and Family: More than three times a week    Frequency of Social Gatherings with Friends and Family: More than three times a week    Attends Religious Services: More than 4 times per year    Active Member of [employer] or Organizations: No    Attends Banker Meetings: Never    Marital Status: Divorced  Catering Manager Violence: Not At Risk (06/01/2023)   Humiliation, Afraid, Rape, and Kick questionnaire    Fear of Current or Ex-Partner: No    Emotionally Abused: No    Physically Abused: No    Sexually Abused: No   Depression (PHQ2-9): Low Risk (12/22/2023)   Depression (PHQ2-9)    PHQ-2 Score: 0  Alcohol Screen: Low Risk (06/01/2023)   Alcohol Screen    Last Alcohol Screening Score (AUDIT): 0  Housing: Low Risk (06/01/2023)   Housing Stability Vital Sign    Unable to Pay for Housing in the Last Year: No    Number of Times Moved in the Last Year: 0    Homeless in the Last Year: No  Utilities: Not At Risk (06/01/2023)   AHC Utilities    Threatened with loss of utilities: No  Health Literacy: Adequate Health Literacy (06/01/2023)   B1300 Health Literacy    Frequency of need for help with medical instructions: Never    Outpatient Medications Prior to Visit  Medication Sig Dispense Refill   aspirin  EC 81 MG tablet Take by mouth.     calcium -vitamin D  (OSCAL WITH D) 500-200 MG-UNIT tablet Take 1 tablet by mouth.     lenalidomide  (REVLIMID ) 10 MG capsule TAKE 1 CAPSULE BY MOUTH 1 TIME A DAY FOR 21 DAYS ON THEN 7 DAYS OFF 21 capsule 0   metoprolol  succinate (TOPROL -XL) 25 MG 24 hr tablet Take 1 tablet (25 mg total) by mouth daily. 90 tablet 3   Multiple Vitamin (MULTIVITAMIN) tablet Take 1 tablet by mouth daily.       Multiple Vitamins/Iron TABS Take by mouth.     nitroGLYCERIN  (NITROSTAT ) 0.4 MG SL tablet Place 1 tablet (0.4 mg total) under the tongue every 5 (five) minutes for 3 doses as needed for chest pain. 25 tablet 3   ondansetron  (ZOFRAN ) 8 MG tablet Take 8 mg by mouth every 8 (eight) hours as needed for nausea or vomiting.     prochlorperazine  (COMPAZINE ) 10 MG tablet Take 10 mg by mouth every 8 (eight) hours as needed for nausea or vomiting.     traMADol  (ULTRAM ) 50 MG tablet Take 1 tablet (50 mg total) by mouth every 6 (six) hours as needed. 60 tablet 0   rosuvastatin  (CRESTOR ) 40 MG tablet Take 1 tablet (40 mg total) by mouth every other day. 90 tablet 1   No facility-administered medications prior to visit.    No Known Allergies  ROS Review of Systems  Constitutional:  Positive for  fatigue. Negative for chills and fever.  HENT:  Negative for congestion and sore throat.   Eyes:  Negative for pain and discharge.  Respiratory:  Negative for cough and shortness of breath.   Cardiovascular:  Negative for chest pain and palpitations.  Gastrointestinal:  Negative for diarrhea, nausea and vomiting.  Endocrine: Negative for polydipsia and polyuria.  Genitourinary:  Negative for dysuria and hematuria.  Musculoskeletal:  Positive for arthralgias. Negative for neck pain and neck stiffness.  Skin:  Negative for rash.  Neurological:  Negative for dizziness, weakness, numbness and headaches.  Psychiatric/Behavioral:  Negative for agitation and behavioral problems.       Objective:    Physical Exam Vitals reviewed.  Constitutional:      General: He is not in acute distress.    Appearance: He is obese. He is not diaphoretic.  HENT:     Head: Normocephalic and atraumatic.     Nose: Nose normal.     Mouth/Throat:     Mouth: Mucous membranes are moist.  Eyes:     General: No scleral icterus.    Extraocular Movements: Extraocular movements intact.  Cardiovascular:     Rate and Rhythm: Normal rate and regular rhythm.     Heart sounds: Normal heart sounds. No murmur heard. Pulmonary:     Breath sounds: Normal breath sounds. No wheezing or rales.  Musculoskeletal:     Cervical back: Neck supple. No tenderness.     Right lower leg: No edema.     Left lower leg: No edema.  Skin:    General: Skin is warm.     Findings: No rash.  Neurological:     General: No focal deficit present.     Mental Status: He is alert and oriented to person, place, and time.     Sensory: No sensory deficit.     Motor: No weakness.  Psychiatric:        Mood and Affect: Mood normal.        Behavior: Behavior normal.     BP 134/64 (BP Location: Left Arm)   Pulse 67   Ht 5' 9 (1.753 m)   Wt 235 lb (106.6 kg)   SpO2 98%   BMI 34.70 kg/m  Wt Readings from Last 3 Encounters:  12/22/23 235  lb (106.6 kg)  10/26/23 235 lb (106.6 kg)  09/15/23 233 lb (105.7 kg)    Lab Results  Component Value Date   TSH 3.309 07/27/2023   Lab Results  Component Value Date   WBC 4.0 10/19/2023   HGB 13.8 10/19/2023   HCT 41.7 10/19/2023   MCV 98.1 10/19/2023   PLT 209 10/19/2023   Lab Results  Component Value Date   NA 144 10/19/2023   K 3.8 10/19/2023   CO2 26 10/19/2023   GLUCOSE 95 10/19/2023   BUN 14 10/19/2023   CREATININE 1.11 10/19/2023   BILITOT 0.6 10/19/2023   ALKPHOS 61 10/19/2023   AST 31 10/19/2023   ALT 27 10/19/2023   PROT 7.5 10/19/2023   ALBUMIN 4.3 10/19/2023   CALCIUM  9.2 10/19/2023   ANIONGAP 14 10/19/2023   Lab Results  Component Value Date   CHOL 135 07/27/2023   Lab Results  Component Value Date   HDL 54 07/27/2023   Lab Results  Component Value Date   LDLCALC 69 07/27/2023   Lab Results  Component Value Date   TRIG 59 07/27/2023   Lab Results  Component Value Date   CHOLHDL 2.5 07/27/2023   Lab Results  Component Value Date   HGBA1C 4.7 (L) 06/30/2022      Assessment & Plan:   Problem List Items Addressed This Visit       Cardiovascular and Mediastinum   CAD (coronary artery disease)   Has had cardiac cath in the past, no  stents On Aspirin , statin and B-blocker Counseled again to stay compliant to statin therapy F/u with Cardiology      Relevant Medications   rosuvastatin  (CRESTOR ) 40 MG tablet   Essential hypertension - Primary   BP Readings from Last 1 Encounters:  12/22/23 134/64   Overall well-controlled with Metoprolol  25 mg QD Counseled for compliance with the medications Advised DASH diet and moderate exercise/walking as tolerated      Relevant Medications   rosuvastatin  (CRESTOR ) 40 MG tablet     Musculoskeletal and Integument   Metastatic multiple myeloma to bone [facility])   Patient has metastatic disease to bone. Started on Zometa  in June 2023, continue Zometa  every 12 weeks at this time Can continue  calcium  and vitamin D         Other   Obesity   Diet modification and moderate exercise/walking advised      Multiple myeloma in remission [facility])   Undergoing chemotherapy for MM S/p stem cell collection at [facility] Followed by Oncology      Mixed hyperlipidemia   Needs to take Crestor , at least QOD Reviewed last lipid profile      Relevant Medications   rosuvastatin  (CRESTOR ) 40 MG tablet   Colon cancer screening   Discussed about colonoscopy and cologuard - benefits of each procedure discussed. Patient prefers Cologuard - ordered.      Relevant Orders   Cologuard     Meds ordered this encounter  Medications   rosuvastatin  (CRESTOR ) 40 MG tablet    Sig: Take 1 tablet (40 mg total) by mouth every other day.    Dispense:  45 tablet    Refill:  3    Follow-up: Return in about 6 months (around 06/21/2024) for HTN and CAD.    Suzzane MARLA Blanch, MD

## 2023-12-22 NOTE — Assessment & Plan Note (Signed)
 BP Readings from Last 1 Encounters:  12/22/23 134/64   Overall well-controlled with Metoprolol  25 mg QD Counseled for compliance with the medications Advised DASH diet and moderate exercise/walking as tolerated

## 2023-12-24 ENCOUNTER — Encounter (HOSPITAL_COMMUNITY): Payer: Self-pay | Admitting: Oncology

## 2023-12-24 DIAGNOSIS — Z1211 Encounter for screening for malignant neoplasm of colon: Secondary | ICD-10-CM | POA: Insufficient documentation

## 2023-12-24 NOTE — Assessment & Plan Note (Signed)
Diet modification and moderate exercise/walking advised 

## 2023-12-24 NOTE — Assessment & Plan Note (Signed)
 Patient has metastatic disease to bone. Started on Zometa  in June 2023, continue Zometa  every 12 weeks at this time Can continue calcium  and vitamin D 

## 2023-12-24 NOTE — Assessment & Plan Note (Signed)
 Discussed about colonoscopy and cologuard - benefits of each procedure discussed. Patient prefers Cologuard - ordered.

## 2024-01-03 ENCOUNTER — Other Ambulatory Visit: Payer: Self-pay | Admitting: Oncology

## 2024-01-03 ENCOUNTER — Other Ambulatory Visit: Payer: Self-pay

## 2024-01-03 MED ORDER — LENALIDOMIDE 10 MG PO CAPS: ORAL_CAPSULE | ORAL | 0 refills | Status: DC

## 2024-01-03 NOTE — Telephone Encounter (Signed)
 Chart reviewed. Revlimid  refilled per last office note with Dr. Davonna.

## 2024-01-10 ENCOUNTER — Encounter: Payer: Self-pay | Admitting: *Deleted

## 2024-01-11 ENCOUNTER — Inpatient Hospital Stay: Attending: Hematology

## 2024-01-11 DIAGNOSIS — C9001 Multiple myeloma in remission: Secondary | ICD-10-CM | POA: Diagnosis present

## 2024-01-11 DIAGNOSIS — C9000 Multiple myeloma not having achieved remission: Secondary | ICD-10-CM

## 2024-01-11 LAB — CBC WITH DIFFERENTIAL/PLATELET
Abs Immature Granulocytes: 0.01 K/uL (ref 0.00–0.07)
Basophils Absolute: 0 K/uL (ref 0.0–0.1)
Basophils Relative: 1 %
Eosinophils Absolute: 0.1 K/uL (ref 0.0–0.5)
Eosinophils Relative: 1 %
HCT: 39.9 % (ref 39.0–52.0)
Hemoglobin: 13.4 g/dL (ref 13.0–17.0)
Immature Granulocytes: 0 %
Lymphocytes Relative: 53 %
Lymphs Abs: 2.5 K/uL (ref 0.7–4.0)
MCH: 32.8 pg (ref 26.0–34.0)
MCHC: 33.6 g/dL (ref 30.0–36.0)
MCV: 97.6 fL (ref 80.0–100.0)
Monocytes Absolute: 0.7 K/uL (ref 0.1–1.0)
Monocytes Relative: 15 %
Neutro Abs: 1.4 K/uL — ABNORMAL LOW (ref 1.7–7.7)
Neutrophils Relative %: 30 %
Platelets: 215 K/uL (ref 150–400)
RBC: 4.09 MIL/uL — ABNORMAL LOW (ref 4.22–5.81)
RDW: 13.2 % (ref 11.5–15.5)
WBC: 4.8 K/uL (ref 4.0–10.5)
nRBC: 0 % (ref 0.0–0.2)

## 2024-01-11 LAB — COMPREHENSIVE METABOLIC PANEL WITH GFR
ALT: 22 U/L (ref 0–44)
AST: 26 U/L (ref 15–41)
Albumin: 4.3 g/dL (ref 3.5–5.0)
Alkaline Phosphatase: 62 U/L (ref 38–126)
Anion gap: 12 (ref 5–15)
BUN: 13 mg/dL (ref 8–23)
CO2: 25 mmol/L (ref 22–32)
Calcium: 8.9 mg/dL (ref 8.9–10.3)
Chloride: 105 mmol/L (ref 98–111)
Creatinine, Ser: 0.92 mg/dL (ref 0.61–1.24)
GFR, Estimated: >60 mL/min
Glucose, Bld: 106 mg/dL — ABNORMAL HIGH (ref 70–99)
Potassium: 3.8 mmol/L (ref 3.5–5.1)
Sodium: 141 mmol/L (ref 135–145)
Total Bilirubin: 0.5 mg/dL (ref 0.0–1.2)
Total Protein: 7.3 g/dL (ref 6.5–8.1)

## 2024-01-12 LAB — KAPPA/LAMBDA LIGHT CHAINS
Kappa free light chain: 36.8 mg/L — ABNORMAL HIGH (ref 3.3–19.4)
Kappa, lambda light chain ratio: 1.66 — ABNORMAL HIGH (ref 0.26–1.65)
Lambda free light chains: 22.2 mg/L (ref 5.7–26.3)

## 2024-01-14 LAB — PROTEIN ELECTROPHORESIS, SERUM
A/G Ratio: 1 (ref 0.7–1.7)
Albumin ELP: 3.4 g/dL (ref 2.9–4.4)
Alpha-1-Globulin: 0.2 g/dL (ref 0.0–0.4)
Alpha-2-Globulin: 0.6 g/dL (ref 0.4–1.0)
Beta Globulin: 1.1 g/dL (ref 0.7–1.3)
Gamma Globulin: 1.6 g/dL (ref 0.4–1.8)
Globulin, Total: 3.5 g/dL (ref 2.2–3.9)
Total Protein ELP: 6.9 g/dL (ref 6.0–8.5)

## 2024-01-14 LAB — IMMUNOFIXATION ELECTROPHORESIS
IgA: 184 mg/dL (ref 61–437)
IgG (Immunoglobin G), Serum: 1566 mg/dL (ref 603–1613)
IgM (Immunoglobulin M), Srm: 40 mg/dL (ref 20–172)
Total Protein ELP: 6.9 g/dL (ref 6.0–8.5)

## 2024-01-14 LAB — COLOGUARD: COLOGUARD: NEGATIVE

## 2024-01-17 ENCOUNTER — Ambulatory Visit: Payer: Self-pay | Admitting: Internal Medicine

## 2024-01-18 ENCOUNTER — Inpatient Hospital Stay (HOSPITAL_BASED_OUTPATIENT_CLINIC_OR_DEPARTMENT_OTHER): Admitting: Oncology

## 2024-01-18 ENCOUNTER — Inpatient Hospital Stay: Attending: Hematology

## 2024-01-18 VITALS — BP 147/75 | HR 67 | Temp 98.1°F | Resp 18 | Wt 236.8 lb

## 2024-01-18 DIAGNOSIS — Z9484 Stem cells transplant status: Secondary | ICD-10-CM | POA: Diagnosis not present

## 2024-01-18 DIAGNOSIS — C9000 Multiple myeloma not having achieved remission: Secondary | ICD-10-CM | POA: Diagnosis not present

## 2024-01-18 DIAGNOSIS — Z79899 Other long term (current) drug therapy: Secondary | ICD-10-CM | POA: Diagnosis not present

## 2024-01-18 DIAGNOSIS — Z7982 Long term (current) use of aspirin: Secondary | ICD-10-CM | POA: Diagnosis not present

## 2024-01-18 DIAGNOSIS — C9001 Multiple myeloma in remission: Secondary | ICD-10-CM | POA: Diagnosis not present

## 2024-01-18 NOTE — Progress Notes (Signed)
 We will be stopping Zometa  indefinitely per [facility]

## 2024-01-18 NOTE — Progress Notes (Signed)
 " Patient Care Team: Eugene Suzzane POUR, MD as PCP - General (Internal Medicine) Eugene Eugene [name] FALCON, MD as PCP - Cardiology (Cardiology) Eugene Carlin POUR, DO as Consulting Physician (Internal Medicine) Eugene Joesph SQUIBB, RN as Oncology Nurse Navigator (Oncology) Eugene, Eugene [name] FALCON, MD as Consulting Physician (Cardiology) Eugene Anes, DO as Consulting Physician (Optometry) Eugene Lin, MD (Inactive) as Attending Physician (Orthopedic Surgery) Eugene [name] as Physician Assistant (Cardiology)  Clinic Day:  01/18/2024  Referring physician: Tobie Suzzane POUR, MD   CHIEF COMPLAINT:  CC: IgG kappa multiple myeloma   ASSESSMENT & PLAN:   Assessment & Plan: Eugene [name]  is a 69 y.o. male with IgG kappa multiple myeloma  Assessment and Plan  Multiple myeloma in remission Multiple myeloma diagnosed in 2022.  S/p treatment with Dara VRD followed by maintenance Dara/Revlimid  for 2 years.  Currently on limited 10 mg 3 weeks on 1 week off.  Patient collected for stem cell transplant but reserved for CR2   - Reviewed labs today.  CMP: WNL, CBC: WNL, SPEP: No M spike, IFE: Unremarkable, kappa free light chain slightly elevated at 36.8, lambda free light chains: 22.2, ratio: 1.66.  Within normal limits.  No evidence of recurrence of multiple myeloma - Continue Revlimid  10 mg - 3 weeks on/1 week off - Continue aspirin  daily   Return to clinic in 3 months with labs.  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 Started 07/2021  - Completed more than 2 years of zometa . Will discontinue zometa  at this time.  - Continue calcium  and vitamin D  supplementation.  The patient understands the plans discussed today and is in agreement with them.  He knows to contact our office if he develops concerns prior to his next appointment.  30 minutes of total time was spent for this patient encounter, including preparation,face-to-face  counseling with the patient and coordination of care, physical exam, and documentation of the encounter.    Eugene [name],acting as a neurosurgeon for Eugene Dry, MD.,have documented all relevant documentation on the behalf of Eugene Dry, MD,as directed by  Eugene Dry, MD while in the presence of Eugene Dry, MD.  I, Eugene Dry MD, have reviewed the above documentation for accuracy and completeness, and I agree with the above.    Eugene Dry, MD  [name] A DEPT OF Eugene [name] [address] [place] [address] Dept: [phone] Dept Fax: [phone]   Orders Placed This Encounter  Procedures   CBC with Differential    Standing Status:   Standing    Number of Occurrences:   10    Expiration Date:   01/17/2025   Comprehensive metabolic panel    Standing Status:   Standing    Number of Occurrences:   10    Expiration Date:   01/17/2025   Immunofixation electrophoresis    Standing Status:   Standing    Number of Occurrences:   10    Expiration Date:   01/17/2025   Kappa/lambda light chains    Standing Status:   Standing    Number of Occurrences:   10    Expiration Date:   01/17/2025   Protein electrophoresis, serum    Standing Status:   Standing    Number of Occurrences:   10    Expiration Date:   01/17/2025     ONCOLOGY HISTORY:   Diagnosis: Stage  I standard risk, IgG kappa multiple myeloma  - 04/2017: Reported low back pain radiating to right thigh - 10/22/2017: Skeletal survey: No suspicious lytic lesions - 12/06/2018: Abnormal M spike of 3.2 g -06/11/2020: X-ray of right sided pelvis showed prominent lytic lesions noted in the proximal and midportion of the right femoral diaphysis with no evidence of fracture or dislocation - 07/04/2020: PET scan: Multiple hypermetabolic bony soft tissue lesions. Dominant lesions in the L5 vertebral body and posterior right acetabulum and distal  right femur.  Focal increased uptake in the right tonsillar region with associated soft tissue fullness on CT imaging.  - Labs: 24-hour urine protein: 134, beta-2  microglobulin: 2.1, LDH: 136 - 07/09/2020: Bone marrow biopsy showed hypercellular marrow with trilineage hematopoiesis.  Plasma cells representing 10% of cells             -Chromosome analysis: 46 XY - Multiple myeloma FISH: Negative - 09/03/2020: Started on Dara VRD - 09/2020: Stem cell collection at [facility].  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  Soft tissue lesion in the posterior right acetabulum measures 2.2 cm SUV 2.5, previously 2.2 cm with SUV 9.  Hypermetabolism in the left tonsil SUV 5.9.  Direct oropharyngeal exam did not show any mass.  New mildly hypermetabolic left lower pulmonary nodules nonspecific.  -07/2021: Started Zometa  every 6 weeks -05/2022: Changed Zometa  to every 12 weeks - 06/08/2023: Last dose of daratumumab .  Completed 2 years of treatment.  Continuing Revlimid  10 mg daily 3 weeks on /1 week off.   Current treatment: Revlimid  10 mg daily for 3 weeks followed by 1 week break  INTERVAL HISTORY:   Eugene [name] is here today for follow-up of multiple myeloma. He is unaccompanied today.   Eugene [name] has no complaints today. We discussed lab results today, which were grossly normal. He is taking Revlimid  and aspirin  as prescribed.   I have reviewed the past medical history, past surgical history, social history and family history with the patient and they are unchanged from previous note.  ALLERGIES:  has no known allergies.  MEDICATIONS:  Current Outpatient Medications  Medication Sig Dispense Refill   aspirin  EC 81 MG tablet Take by mouth.     calcium -vitamin D  (OSCAL WITH D)  500-200 MG-UNIT tablet Take 1 tablet by mouth.     lenalidomide  (REVLIMID ) 10 MG capsule TAKE 1 CAPSULE BY MOUTH 1 TIME A DAY FOR 21 DAYS ON THEN 7 DAYS OFF 21 capsule 0   metoprolol  succinate (TOPROL -XL) 25 MG 24 hr tablet Take 1 tablet (25 mg total) by mouth daily. 90 tablet 3   Multiple Vitamin (MULTIVITAMIN) tablet Take 1 tablet by mouth daily.       Multiple Vitamins/Iron TABS Take by mouth.     nitroGLYCERIN  (NITROSTAT ) 0.4 MG SL tablet Place 1 tablet (0.4 mg total) under the tongue every 5 (five) minutes for 3 doses as needed for chest pain. 25 tablet 3   ondansetron  (ZOFRAN ) 8 MG tablet Take 8 mg by mouth every 8 (eight) hours as needed for nausea or vomiting. (Patient not taking: Reported on 01/18/2024)     prochlorperazine  (COMPAZINE ) 10 MG tablet Take 10 mg by mouth every 8 (eight) hours as needed for nausea or vomiting.  rosuvastatin  (CRESTOR ) 40 MG tablet Take 1 tablet (40 mg total) by mouth every other day. 45 tablet 3   traMADol  (ULTRAM ) 50 MG tablet Take 1 tablet (50 mg total) by mouth every 6 (six) hours as needed. 60 tablet 0   No current facility-administered medications for this visit.    VITALS:  Blood pressure (!) 147/75, pulse 67, temperature 98.1 F (36.7 C), temperature source Oral, resp. rate 18, weight 236 lb 12.8 oz (107.4 kg), SpO2 100%.  Wt Readings from Last 3 Encounters:  01/18/24 236 lb 12.8 oz (107.4 kg)  12/22/23 235 lb (106.6 kg)  10/26/23 235 lb (106.6 kg)    Body mass index is 34.97 kg/m.  Performance status (ECOG): 0 - Asymptomatic  PHYSICAL EXAM:   GENERAL:alert, no distress and comfortable SKIN: skin color, texture, turgor are normal, no rashes or significant lesions LYMPH:  no palpable lymphadenopathy in the cervical, axillary or inguinal LUNGS: clear to auscultation and percussion with normal breathing effort HEART: regular rate & rhythm and no murmurs and no lower extremity edema ABDOMEN:abdomen soft, non-tender and normal bowel  sounds Musculoskeletal:no cyanosis of digits and no clubbing  NEURO: alert & oriented x 3 with fluent speech  LABORATORY DATA:  I have reviewed the data as listed     Component Value Date/Time   NA 141 01/11/2024 1453   K 3.8 01/11/2024 1453   CL 105 01/11/2024 1453   CO2 25 01/11/2024 1453   GLUCOSE 106 (H) 01/11/2024 1453   BUN 13 01/11/2024 1453   CREATININE 0.92 01/11/2024 1453   CREATININE 1.09 06/02/2019 0811   CALCIUM  8.9 01/11/2024 1453   PROT 7.3 01/11/2024 1453   ALBUMIN 4.3 01/11/2024 1453   AST 26 01/11/2024 1453   ALT 22 01/11/2024 1453   ALKPHOS 62 01/11/2024 1453   BILITOT 0.5 01/11/2024 1453   GFRNONAA >60 01/11/2024 1453   GFRNONAA 75 08/05/2017 0939   GFRAA >60 06/24/2018 0152   GFRAA 87 08/05/2017 0939     Lab Results  Component Value Date   WBC 4.8 01/11/2024   NEUTROABS 1.4 (L) 01/11/2024   HGB 13.4 01/11/2024   HCT 39.9 01/11/2024   MCV 97.6 01/11/2024   PLT 215 01/11/2024      Chemistry      Component Value Date/Time   NA 141 01/11/2024 1453   K 3.8 01/11/2024 1453   CL 105 01/11/2024 1453   CO2 25 01/11/2024 1453   BUN 13 01/11/2024 1453   CREATININE 0.92 01/11/2024 1453   CREATININE 1.09 06/02/2019 0811      Component Value Date/Time   CALCIUM  8.9 01/11/2024 1453   ALKPHOS 62 01/11/2024 1453   AST 26 01/11/2024 1453   ALT 22 01/11/2024 1453   BILITOT 0.5 01/11/2024 1453      Latest Reference Range & Units 01/11/24 14:52 01/11/24 14:53  Total Protein ELP 6.0 - 8.5 g/dL 6.9 6.9  Albumin ELP 2.9 - 4.4 g/dL 3.4   Globulin, Total 2.2 - 3.9 g/dL 3.5 (C)   A/G Ratio 0.7 - 1.7  1.0 (C)   Alpha-1-Globulin 0.0 - 0.4 g/dL 0.2   Joeyj-7-Honalopw 0.4 - 1.0 g/dL 0.6   Beta Globulin 0.7 - 1.3 g/dL 1.1   Gamma Globulin 0.4 - 1.8 g/dL 1.6   M-SPIKE, % Not Observed g/dL Not Observed   SPE Interp.  Comment   Comment  Comment   IgG (Immunoglobin G), Serum 603 - 1,613 mg/dL  8,433  IgM (Immunoglobulin M), Srm 20 -  172 mg/dL  40  IgA 61 -  562 mg/dL  815  (C): Corrected   Latest Reference Range & Units 01/11/24 14:53  Kappa free light chain 3.3 - 19.4 mg/L 36.8 (H)  Lambda free light chains 5.7 - 26.3 mg/L 22.2  Kappa, lambda light chain ratio 0.26 - 1.65  1.66 (H)  (H): Data is abnormally high  IFE: The immunofixation pattern appears unremarkable. Evidence of monoclonal protein is not apparent.   RADIOGRAPHIC STUDIES: I have personally reviewed the radiological images as listed and agreed with the findings in the report.  "

## 2024-02-01 ENCOUNTER — Other Ambulatory Visit: Payer: Self-pay | Admitting: Oncology

## 2024-02-01 NOTE — Telephone Encounter (Signed)
 Chart reviewed. Revlimid  refilled per last office note with Dr. Davonna.

## 2024-04-18 ENCOUNTER — Inpatient Hospital Stay

## 2024-04-25 ENCOUNTER — Inpatient Hospital Stay: Admitting: Oncology

## 2024-06-06 ENCOUNTER — Ambulatory Visit

## 2024-06-21 ENCOUNTER — Ambulatory Visit: Admitting: Internal Medicine
